# Patient Record
Sex: Female | Born: 1965 | Race: White | Hispanic: No | Marital: Married | Smoking: Current every day smoker
Health system: Southern US, Community
[De-identification: ages and names within clinical notes are randomized; demographics above are authoritative.]

## PROBLEM LIST (undated history)

## (undated) DIAGNOSIS — I509 Heart failure, unspecified: Secondary | ICD-10-CM

## (undated) DIAGNOSIS — D72829 Elevated white blood cell count, unspecified: Secondary | ICD-10-CM

## (undated) DIAGNOSIS — I050 Rheumatic mitral stenosis: Secondary | ICD-10-CM

## (undated) DIAGNOSIS — C539 Malignant neoplasm of cervix uteri, unspecified: Secondary | ICD-10-CM

## (undated) DIAGNOSIS — F1320 Sedative, hypnotic or anxiolytic dependence, uncomplicated: Secondary | ICD-10-CM

## (undated) DIAGNOSIS — D649 Anemia, unspecified: Secondary | ICD-10-CM

## (undated) DIAGNOSIS — E876 Hypokalemia: Secondary | ICD-10-CM

## (undated) DIAGNOSIS — F419 Anxiety disorder, unspecified: Secondary | ICD-10-CM

## (undated) DIAGNOSIS — F329 Major depressive disorder, single episode, unspecified: Secondary | ICD-10-CM

## (undated) DIAGNOSIS — E119 Type 2 diabetes mellitus without complications: Secondary | ICD-10-CM

## (undated) DIAGNOSIS — R51 Headache: Secondary | ICD-10-CM

## (undated) DIAGNOSIS — A481 Legionnaires' disease: Secondary | ICD-10-CM

## (undated) DIAGNOSIS — J9600 Acute respiratory failure, unspecified whether with hypoxia or hypercapnia: Secondary | ICD-10-CM

## (undated) DIAGNOSIS — Z720 Tobacco use: Secondary | ICD-10-CM

## (undated) DIAGNOSIS — F410 Panic disorder [episodic paroxysmal anxiety] without agoraphobia: Secondary | ICD-10-CM

## (undated) DIAGNOSIS — F32A Depression, unspecified: Secondary | ICD-10-CM

## (undated) HISTORY — DX: Tobacco use: Z72.0

## (undated) HISTORY — DX: Heart failure, unspecified: I50.9

## (undated) HISTORY — DX: Legionnaires' disease: A48.1

## (undated) HISTORY — PX: NASAL SINUS SURGERY: SHX719

## (undated) HISTORY — DX: Sedative, hypnotic or anxiolytic dependence, uncomplicated: F13.20

## (undated) HISTORY — DX: Acute respiratory failure, unspecified whether with hypoxia or hypercapnia: J96.00

## (undated) HISTORY — DX: Malignant neoplasm of cervix uteri, unspecified: C53.9

## (undated) HISTORY — DX: Hypokalemia: E87.6

## (undated) HISTORY — PX: LASER ABLATION OF THE CERVIX: SHX1949

## (undated) HISTORY — PX: MOLE REMOVAL: SHX2046

## (undated) HISTORY — DX: Rheumatic mitral stenosis: I05.0

## (undated) HISTORY — DX: Elevated white blood cell count, unspecified: D72.829

---

## 1998-06-25 HISTORY — PX: CERVICAL CONIZATION W/BX: SHX1330

## 2002-06-01 ENCOUNTER — Ambulatory Visit (HOSPITAL_COMMUNITY): Admission: RE | Admit: 2002-06-01 | Discharge: 2002-06-01 | Payer: Self-pay | Admitting: Family Medicine

## 2002-06-01 ENCOUNTER — Encounter: Payer: Self-pay | Admitting: Family Medicine

## 2008-08-23 DIAGNOSIS — D649 Anemia, unspecified: Secondary | ICD-10-CM

## 2008-08-23 HISTORY — DX: Anemia, unspecified: D64.9

## 2009-06-25 HISTORY — PX: OTHER SURGICAL HISTORY: SHX169

## 2010-09-16 ENCOUNTER — Inpatient Hospital Stay (HOSPITAL_COMMUNITY): Payer: Medicare (Managed Care)

## 2010-09-16 ENCOUNTER — Inpatient Hospital Stay (HOSPITAL_COMMUNITY)
Admission: AD | Admit: 2010-09-16 | Discharge: 2010-09-25 | DRG: 207 | Disposition: A | Discharge status: Home / Self Care | Payer: Medicare (Managed Care) | Source: Other Acute Inpatient Hospital | Attending: Critical Care Medicine | Admitting: Critical Care Medicine

## 2010-09-16 DIAGNOSIS — I509 Heart failure, unspecified: Secondary | ICD-10-CM | POA: Diagnosis present

## 2010-09-16 DIAGNOSIS — J9600 Acute respiratory failure, unspecified whether with hypoxia or hypercapnia: Secondary | ICD-10-CM | POA: Diagnosis present

## 2010-09-16 DIAGNOSIS — I059 Rheumatic mitral valve disease, unspecified: Secondary | ICD-10-CM | POA: Diagnosis present

## 2010-09-16 DIAGNOSIS — I498 Other specified cardiac arrhythmias: Secondary | ICD-10-CM | POA: Diagnosis present

## 2010-09-16 DIAGNOSIS — J189 Pneumonia, unspecified organism: Principal | ICD-10-CM | POA: Diagnosis present

## 2010-09-16 DIAGNOSIS — E876 Hypokalemia: Secondary | ICD-10-CM | POA: Diagnosis present

## 2010-09-16 DIAGNOSIS — E440 Moderate protein-calorie malnutrition: Secondary | ICD-10-CM | POA: Diagnosis present

## 2010-09-16 DIAGNOSIS — F17200 Nicotine dependence, unspecified, uncomplicated: Secondary | ICD-10-CM | POA: Diagnosis present

## 2010-09-16 DIAGNOSIS — K219 Gastro-esophageal reflux disease without esophagitis: Secondary | ICD-10-CM | POA: Diagnosis present

## 2010-09-16 LAB — MRSA PCR SCREENING: MRSA by PCR: NEGATIVE

## 2010-09-17 ENCOUNTER — Inpatient Hospital Stay (HOSPITAL_COMMUNITY): Payer: Medicare (Managed Care)

## 2010-09-17 DIAGNOSIS — J158 Pneumonia due to other specified bacteria: Secondary | ICD-10-CM

## 2010-09-17 DIAGNOSIS — J9600 Acute respiratory failure, unspecified whether with hypoxia or hypercapnia: Secondary | ICD-10-CM

## 2010-09-17 LAB — POCT I-STAT 3, ART BLOOD GAS (G3+)
Bicarbonate: 26 mEq/L — ABNORMAL HIGH (ref 20.0–24.0)
O2 Saturation: 96 %
Patient temperature: 98.6
TCO2: 27 mmol/L (ref 0–100)
pCO2 arterial: 51.2 mmHg — ABNORMAL HIGH (ref 35.0–45.0)
pCO2 arterial: 50.7 mmHg — ABNORMAL HIGH (ref 35.0–45.0)
pH, Arterial: 7.306 — ABNORMAL LOW (ref 7.350–7.400)
pH, Arterial: 7.319 — ABNORMAL LOW (ref 7.350–7.400)
pO2, Arterial: 166 mmHg — ABNORMAL HIGH (ref 80.0–100.0)

## 2010-09-17 LAB — LIPID PANEL
Cholesterol: 257 mg/dL — ABNORMAL HIGH (ref 0–200)
HDL: 41 mg/dL (ref >39)
LDL Cholesterol: UNDETERMINED mg/dL (ref 0–99)
Total CHOL/HDL Ratio: 6.3 RATIO
Triglycerides: 443 mg/dL — ABNORMAL HIGH (ref <150)
VLDL: UNDETERMINED mg/dL (ref 0–40)

## 2010-09-17 LAB — BASIC METABOLIC PANEL
BUN: 12 mg/dL (ref 6–23)
CO2: 26 mEq/L (ref 19–32)
Calcium: 7.9 mg/dL — ABNORMAL LOW (ref 8.4–10.5)
Calcium: 7.6 mg/dL — ABNORMAL LOW (ref 8.4–10.5)
Creatinine, Ser: 0.65 mg/dL (ref 0.4–1.2)
GFR calc Af Amer: >60 mL/min (ref >60)
GFR calc Af Amer: >60 mL/min (ref >60)
GFR calc non Af Amer: >60 mL/min (ref >60)
Potassium: 3.9 mEq/L (ref 3.5–5.1)
Sodium: 139 mEq/L (ref 135–145)

## 2010-09-17 LAB — CBC
HCT: 30.7 % — ABNORMAL LOW (ref 36.0–46.0)
Hemoglobin: 11.6 g/dL — ABNORMAL LOW (ref 12.0–15.0)
MCH: 31.9 pg (ref 26.0–34.0)
MCHC: 32.6 g/dL (ref 30.0–36.0)
Platelets: 422 10*3/uL — ABNORMAL HIGH (ref 150–400)
Platelets: 347 10*3/uL (ref 150–400)
RDW: 14.2 % (ref 11.5–15.5)
WBC: 17.8 10*3/uL — ABNORMAL HIGH (ref 4.0–10.5)

## 2010-09-17 LAB — BRAIN NATRIURETIC PEPTIDE: Pro B Natriuretic peptide (BNP): 270 pg/mL — ABNORMAL HIGH (ref 0.0–100.0)

## 2010-09-18 ENCOUNTER — Inpatient Hospital Stay (HOSPITAL_COMMUNITY): Payer: Medicare (Managed Care)

## 2010-09-18 LAB — BLOOD GAS, ARTERIAL
Acid-Base Excess: 1 mmol/L (ref 0.0–2.0)
Acid-Base Excess: 5 mmol/L — ABNORMAL HIGH (ref 0.0–2.0)
Drawn by: 29925
Drawn by: 31996
FIO2: 0.7 %
FIO2: 0.4 %
MECHVT: 360 mL
MECHVT: 360 mL
O2 Saturation: 99.2 %
O2 Saturation: 95.5 %
PEEP: 5 cmH2O
Patient temperature: 98.6
RATE: 35 resp/min
RATE: 28 resp/min
TCO2: 27.6 mmol/L (ref 0–100)
pO2, Arterial: 149 mmHg — ABNORMAL HIGH (ref 80.0–100.0)
pO2, Arterial: 73.3 mmHg — ABNORMAL LOW (ref 80.0–100.0)

## 2010-09-18 LAB — BASIC METABOLIC PANEL
BUN: 8 mg/dL (ref 6–23)
BUN: 9 mg/dL (ref 6–23)
BUN: 11 mg/dL (ref 6–23)
CO2: 24 mEq/L (ref 19–32)
CO2: 29 mEq/L (ref 19–32)
CO2: 28 mEq/L (ref 19–32)
Calcium: 7.3 mg/dL — ABNORMAL LOW (ref 8.4–10.5)
Calcium: 8.2 mg/dL — ABNORMAL LOW (ref 8.4–10.5)
Chloride: 111 mEq/L (ref 96–112)
Chloride: 105 mEq/L (ref 96–112)
Creatinine, Ser: 0.57 mg/dL (ref 0.4–1.2)
Creatinine, Ser: 0.58 mg/dL (ref 0.4–1.2)
Creatinine, Ser: 0.62 mg/dL (ref 0.4–1.2)
GFR calc Af Amer: >60 mL/min (ref >60)
GFR calc non Af Amer: >60 mL/min (ref >60)
Glucose, Bld: 147 mg/dL — ABNORMAL HIGH (ref 70–99)
Potassium: 3.8 mEq/L (ref 3.5–5.1)
Sodium: 140 mEq/L (ref 135–145)

## 2010-09-18 LAB — CBC
Hemoglobin: 8.6 g/dL — ABNORMAL LOW (ref 12.0–15.0)
MCH: 32.2 pg (ref 26.0–34.0)
MCHC: 32.8 g/dL (ref 30.0–36.0)
MCV: 98.1 fL (ref 78.0–100.0)
RBC: 2.67 MIL/uL — ABNORMAL LOW (ref 3.87–5.11)

## 2010-09-18 LAB — MAGNESIUM: Magnesium: 2 mg/dL (ref 1.5–2.5)

## 2010-09-18 LAB — CORTISOL: Cortisol, Plasma: 7.1 ug/dL

## 2010-09-18 LAB — PHOSPHORUS: Phosphorus: 2.8 mg/dL (ref 2.3–4.6)

## 2010-09-18 LAB — PROTIME-INR: INR: 1.28 (ref 0.00–1.49)

## 2010-09-19 ENCOUNTER — Inpatient Hospital Stay (HOSPITAL_COMMUNITY): Payer: Medicare (Managed Care)

## 2010-09-19 DIAGNOSIS — J9600 Acute respiratory failure, unspecified whether with hypoxia or hypercapnia: Secondary | ICD-10-CM

## 2010-09-19 DIAGNOSIS — J189 Pneumonia, unspecified organism: Secondary | ICD-10-CM

## 2010-09-19 DIAGNOSIS — R579 Shock, unspecified: Secondary | ICD-10-CM

## 2010-09-19 LAB — COMPREHENSIVE METABOLIC PANEL
ALT: 16 U/L (ref 0–35)
AST: 13 U/L (ref 0–37)
Calcium: 8.2 mg/dL — ABNORMAL LOW (ref 8.4–10.5)
GFR calc Af Amer: >60 mL/min (ref >60)
Glucose, Bld: 140 mg/dL — ABNORMAL HIGH (ref 70–99)
Sodium: 140 mEq/L (ref 135–145)
Total Protein: 5.9 g/dL — ABNORMAL LOW (ref 6.0–8.3)

## 2010-09-19 LAB — GLUCOSE, CAPILLARY
Glucose-Capillary: 125 mg/dL — ABNORMAL HIGH (ref 70–99)
Glucose-Capillary: 180 mg/dL — ABNORMAL HIGH (ref 70–99)
Glucose-Capillary: 145 mg/dL — ABNORMAL HIGH (ref 70–99)
Glucose-Capillary: 143 mg/dL — ABNORMAL HIGH (ref 70–99)
Glucose-Capillary: 146 mg/dL — ABNORMAL HIGH (ref 70–99)
Glucose-Capillary: 157 mg/dL — ABNORMAL HIGH (ref 70–99)

## 2010-09-19 LAB — BRAIN NATRIURETIC PEPTIDE: Pro B Natriuretic peptide (BNP): 127 pg/mL — ABNORMAL HIGH (ref 0.0–100.0)

## 2010-09-19 LAB — CBC
MCHC: 31.8 g/dL (ref 30.0–36.0)
RDW: 13.9 % (ref 11.5–15.5)

## 2010-09-19 LAB — LEGIONELLA ANTIGEN, URINE

## 2010-09-19 LAB — INFLUENZA PANEL BY PCR (TYPE A & B)
H1N1 flu by pcr: NOT DETECTED
Influenza A By PCR: NEGATIVE

## 2010-09-19 LAB — C4 COMPLEMENT: Complement C4, Body Fluid: 23 mg/dL (ref 16–47)

## 2010-09-20 ENCOUNTER — Inpatient Hospital Stay (HOSPITAL_COMMUNITY): Payer: Medicare (Managed Care)

## 2010-09-20 DIAGNOSIS — J158 Pneumonia due to other specified bacteria: Secondary | ICD-10-CM

## 2010-09-20 LAB — CBC
HCT: 27.3 % — ABNORMAL LOW (ref 36.0–46.0)
Hemoglobin: 8.9 g/dL — ABNORMAL LOW (ref 12.0–15.0)
MCH: 32.1 pg (ref 26.0–34.0)
MCV: 98.6 fL (ref 78.0–100.0)
RBC: 2.77 MIL/uL — ABNORMAL LOW (ref 3.87–5.11)
WBC: 16.1 10*3/uL — ABNORMAL HIGH (ref 4.0–10.5)

## 2010-09-20 LAB — BLOOD GAS, ARTERIAL
Drawn by: 312761
MECHVT: 420 mL
PEEP: 5 cmH2O
Patient temperature: 98.6
RATE: 20 resp/min
TCO2: 36.9 mmol/L (ref 0–100)
pH, Arterial: 7.412 — ABNORMAL HIGH (ref 7.350–7.400)

## 2010-09-20 LAB — BASIC METABOLIC PANEL
CO2: 34 mEq/L — ABNORMAL HIGH (ref 19–32)
Calcium: 8.4 mg/dL (ref 8.4–10.5)
Creatinine, Ser: 0.63 mg/dL (ref 0.4–1.2)
GFR calc Af Amer: >60 mL/min (ref >60)
GFR calc non Af Amer: >60 mL/min (ref >60)

## 2010-09-20 LAB — GLUCOSE, CAPILLARY
Glucose-Capillary: 153 mg/dL — ABNORMAL HIGH (ref 70–99)
Glucose-Capillary: 154 mg/dL — ABNORMAL HIGH (ref 70–99)
Glucose-Capillary: 166 mg/dL — ABNORMAL HIGH (ref 70–99)
Glucose-Capillary: 206 mg/dL — ABNORMAL HIGH (ref 70–99)
Glucose-Capillary: 201 mg/dL — ABNORMAL HIGH (ref 70–99)

## 2010-09-20 LAB — MAGNESIUM: Magnesium: 2.1 mg/dL (ref 1.5–2.5)

## 2010-09-20 LAB — ANTI-NUCLEAR AB-TITER (ANA TITER)

## 2010-09-21 ENCOUNTER — Inpatient Hospital Stay (HOSPITAL_COMMUNITY): Payer: Medicare (Managed Care)

## 2010-09-21 LAB — GLUCOSE, CAPILLARY
Glucose-Capillary: 145 mg/dL — ABNORMAL HIGH (ref 70–99)
Glucose-Capillary: 136 mg/dL — ABNORMAL HIGH (ref 70–99)
Glucose-Capillary: 170 mg/dL — ABNORMAL HIGH (ref 70–99)
Glucose-Capillary: 195 mg/dL — ABNORMAL HIGH (ref 70–99)

## 2010-09-21 LAB — BLOOD GAS, ARTERIAL
Bicarbonate: 36.2 mEq/L — ABNORMAL HIGH (ref 20.0–24.0)
Drawn by: 27022
Expiratory PAP: 5
Inspiratory PAP: 5
pCO2 arterial: 55.2 mmHg — ABNORMAL HIGH (ref 35.0–45.0)
pH, Arterial: 7.432 — ABNORMAL HIGH (ref 7.350–7.400)
pO2, Arterial: 78.9 mmHg — ABNORMAL LOW (ref 80.0–100.0)

## 2010-09-21 LAB — BASIC METABOLIC PANEL
CO2: 36 mEq/L — ABNORMAL HIGH (ref 19–32)
Chloride: 97 mEq/L (ref 96–112)
GFR calc Af Amer: >60 mL/min (ref >60)
Sodium: 145 mEq/L (ref 135–145)

## 2010-09-21 LAB — POCT I-STAT 3, ART BLOOD GAS (G3+)
Acid-Base Excess: 13 mmol/L — ABNORMAL HIGH (ref 0.0–2.0)
Acid-Base Excess: 13 mmol/L — ABNORMAL HIGH (ref 0.0–2.0)
Bicarbonate: 38.4 mEq/L — ABNORMAL HIGH (ref 20.0–24.0)
O2 Saturation: 96 %
Patient temperature: 37
pH, Arterial: 7.457 — ABNORMAL HIGH (ref 7.350–7.400)
pO2, Arterial: 37 mmHg — CL (ref 80.0–100.0)
pO2, Arterial: 76 mmHg — ABNORMAL LOW (ref 80.0–100.0)

## 2010-09-21 LAB — CBC
Hemoglobin: 9 g/dL — ABNORMAL LOW (ref 12.0–15.0)
Platelets: 346 10*3/uL (ref 150–400)
RBC: 2.96 MIL/uL — ABNORMAL LOW (ref 3.87–5.11)
WBC: 14.7 10*3/uL — ABNORMAL HIGH (ref 4.0–10.5)

## 2010-09-21 LAB — MAGNESIUM: Magnesium: 2.4 mg/dL (ref 1.5–2.5)

## 2010-09-21 LAB — PHOSPHORUS: Phosphorus: 3.8 mg/dL (ref 2.3–4.6)

## 2010-09-22 ENCOUNTER — Inpatient Hospital Stay (HOSPITAL_COMMUNITY): Payer: Medicare (Managed Care)

## 2010-09-22 DIAGNOSIS — R579 Shock, unspecified: Secondary | ICD-10-CM

## 2010-09-22 DIAGNOSIS — J9600 Acute respiratory failure, unspecified whether with hypoxia or hypercapnia: Secondary | ICD-10-CM

## 2010-09-22 DIAGNOSIS — J158 Pneumonia due to other specified bacteria: Secondary | ICD-10-CM

## 2010-09-22 LAB — CBC
HCT: 32.4 % — ABNORMAL LOW (ref 36.0–46.0)
MCHC: 32.7 g/dL (ref 30.0–36.0)
Platelets: 462 10*3/uL — ABNORMAL HIGH (ref 150–400)
RDW: 13.4 % (ref 11.5–15.5)
WBC: 17.3 10*3/uL — ABNORMAL HIGH (ref 4.0–10.5)

## 2010-09-22 LAB — BASIC METABOLIC PANEL
CO2: 35 mEq/L — ABNORMAL HIGH (ref 19–32)
Calcium: 9.4 mg/dL (ref 8.4–10.5)
Chloride: 97 mEq/L (ref 96–112)
Creatinine, Ser: 0.66 mg/dL (ref 0.4–1.2)
GFR calc Af Amer: >60 mL/min (ref >60)
Sodium: 144 mEq/L (ref 135–145)

## 2010-09-22 LAB — PHOSPHORUS: Phosphorus: 3.8 mg/dL (ref 2.3–4.6)

## 2010-09-22 LAB — MAGNESIUM: Magnesium: 2.4 mg/dL (ref 1.5–2.5)

## 2010-09-22 LAB — GLUCOSE, CAPILLARY
Glucose-Capillary: 187 mg/dL — ABNORMAL HIGH (ref 70–99)
Glucose-Capillary: 207 mg/dL — ABNORMAL HIGH (ref 70–99)
Glucose-Capillary: 146 mg/dL — ABNORMAL HIGH (ref 70–99)

## 2010-09-23 ENCOUNTER — Inpatient Hospital Stay (HOSPITAL_COMMUNITY): Payer: Medicare (Managed Care)

## 2010-09-23 LAB — GLUCOSE, CAPILLARY
Glucose-Capillary: 169 mg/dL — ABNORMAL HIGH (ref 70–99)
Glucose-Capillary: 168 mg/dL — ABNORMAL HIGH (ref 70–99)
Glucose-Capillary: 198 mg/dL — ABNORMAL HIGH (ref 70–99)

## 2010-09-23 LAB — BASIC METABOLIC PANEL
BUN: 25 mg/dL — ABNORMAL HIGH (ref 6–23)
CO2: 32 mEq/L (ref 19–32)
Glucose, Bld: 136 mg/dL — ABNORMAL HIGH (ref 70–99)
Potassium: 3.6 mEq/L (ref 3.5–5.1)
Sodium: 142 mEq/L (ref 135–145)

## 2010-09-23 LAB — CBC
HCT: 33.1 % — ABNORMAL LOW (ref 36.0–46.0)
Hemoglobin: 10.9 g/dL — ABNORMAL LOW (ref 12.0–15.0)
MCV: 97.1 fL (ref 78.0–100.0)
WBC: 18.6 10*3/uL — ABNORMAL HIGH (ref 4.0–10.5)

## 2010-09-24 DIAGNOSIS — J9600 Acute respiratory failure, unspecified whether with hypoxia or hypercapnia: Secondary | ICD-10-CM

## 2010-09-24 DIAGNOSIS — J158 Pneumonia due to other specified bacteria: Secondary | ICD-10-CM

## 2010-09-24 DIAGNOSIS — R579 Shock, unspecified: Secondary | ICD-10-CM

## 2010-09-24 LAB — BASIC METABOLIC PANEL
CO2: 27 mEq/L (ref 19–32)
Calcium: 9.1 mg/dL (ref 8.4–10.5)
Chloride: 101 mEq/L (ref 96–112)
GFR calc Af Amer: >60 mL/min (ref >60)
Potassium: 2.9 mEq/L — ABNORMAL LOW (ref 3.5–5.1)
Sodium: 142 mEq/L (ref 135–145)

## 2010-09-24 LAB — GLUCOSE, CAPILLARY
Glucose-Capillary: 164 mg/dL — ABNORMAL HIGH (ref 70–99)
Glucose-Capillary: 104 mg/dL — ABNORMAL HIGH (ref 70–99)

## 2010-09-25 ENCOUNTER — Inpatient Hospital Stay (HOSPITAL_COMMUNITY): Payer: Medicare (Managed Care)

## 2010-09-25 DIAGNOSIS — J810 Acute pulmonary edema: Secondary | ICD-10-CM

## 2010-09-25 DIAGNOSIS — I050 Rheumatic mitral stenosis: Secondary | ICD-10-CM

## 2010-09-25 DIAGNOSIS — F17200 Nicotine dependence, unspecified, uncomplicated: Secondary | ICD-10-CM

## 2010-09-25 LAB — BASIC METABOLIC PANEL
CO2: 24 mEq/L (ref 19–32)
Chloride: 103 mEq/L (ref 96–112)
Creatinine, Ser: 0.66 mg/dL (ref 0.4–1.2)
GFR calc Af Amer: >60 mL/min (ref >60)
Potassium: 3.5 mEq/L (ref 3.5–5.1)
Sodium: 137 mEq/L (ref 135–145)

## 2010-09-25 LAB — GLUCOSE, CAPILLARY
Glucose-Capillary: 144 mg/dL — ABNORMAL HIGH (ref 70–99)
Glucose-Capillary: 167 mg/dL — ABNORMAL HIGH (ref 70–99)

## 2010-09-25 LAB — CBC
MCH: 31.4 pg (ref 26.0–34.0)
MCHC: 32.7 g/dL (ref 30.0–36.0)
MCV: 96 fL (ref 78.0–100.0)
Platelets: 517 10*3/uL — ABNORMAL HIGH (ref 150–400)
RBC: 3.47 MIL/uL — ABNORMAL LOW (ref 3.87–5.11)

## 2010-09-25 LAB — PHOSPHORUS: Phosphorus: 3.7 mg/dL (ref 2.3–4.6)

## 2010-09-25 LAB — MAGNESIUM: Magnesium: 2.3 mg/dL (ref 1.5–2.5)

## 2010-10-03 NOTE — Discharge Summary (Addendum)
Holly Figueroa, Holly Figueroa           ACCOUNT NO.:  [id number]  MEDICAL RECORD NO.:  [id number]           PATIENT TYPE:  I  LOCATION:  2001                         FACILITY:  [place]  PHYSICIAN:  Brand Males, MD   DATE OF BIRTH:  Nov 27, 1965  DATE OF ADMISSION:  09/16/2010 DATE OF DISCHARGE:  09/25/2010                              DISCHARGE SUMMARY   DISCHARGE DIAGNOSES: 1. Acute respiratory failure. 2. Hypokalemia. 3. Congestive heart failure secondary to mitral stenosis. 4. Leukocytosis. 5. Xanax dependence 6. Tobacco abuse.  LINE/TUBES: 1. Oral endotracheal tube from March 29. 2. Left IJ central line from March 22 which has since been removed. 3. Left arterial line from March 25 which has since been removed.  CULTURE DATA:  Urine Legionella on March 26 is negative.  March 24 MRSA PCR is negative.  Influenza PCR on March 27 which is nondetected.  ANTIBIOTIC DATA: 1. The patient was placed on cefepime from March 24 and has since been     stopped, ciprofloxacin from March 24 and has since been stopped. 2. Vancomycin from March 21 to March 24. 3. Zosyn from March 21 to March 24. 4. Avelox from March 16 to March 24.  BEST PRACTICE:  The patient was placed on heparin subcutaneously for DVT prophylaxis and Protonix for stress ulcer prevention.  She also while in intensive care had SCDs.  PROTOCOL/CONSULTANT: 1. ARDS protocol. 2. Continuous sedation.  KEY EVENTS/STUDIES:  The patient with increased O2 requirements and PEEP of 12 needed.  LABORATORY DATA:  April 2 CBC demonstrates WBC 16.6, hemoglobin 10.9, hematocrit 33.3, platelet count of 517,000.  April 2 BMP demonstrates sodium 137, potassium 3.5, chloride 103, CO2 24, glucose 739, BUN 18, creatinine 0.66, calcium 9.1, magnesium 2.3, and phosphorus 3.7.  AUTOIMMUNE PANEL:  ESR 127 on March 26, C3 complement 127, C4 complement 123, C-reactive protein 32.1, rheumatoid factor 22.  Antinuclear antibody titer is 1.40.   Antinuclear antibody is positive.  HISTORY OF PRESENT ILLNESS:  Holly Figueroa is a 45 year old female who was admitted to [facility] for pneumonia and discharged on March 20 after improvement.  She subsequently had a readmission for dyspnea on exertion and required intubation for hypoxic respiratory failure which appeared to be secondary to ARDS.  She was transferred to [facility] on March 24 for further evaluation.  She was admitted to the medical intensive care unit and placed on ARDS protocol. She was aggressively diuresed and autoimmune panel was assessed.  Please see above laboratory data.  Holly Figueroa was noted to be tachycardic during initial period of admission.  A 2-D echo was evaluated and demonstrated an estimated ejection fraction of 60-65% with noted mitral valve, moderate to severe stenosis.  Also noted was mildly thickened leaflets and leaflet separation was moderately reduced.  She transiently required vasoactive agents to maintain normal blood pressure in the setting of shock.  She had significant pulmonary infiltrates on chest x- ray which was all consistent with combination of ARDS and pulmonary edema.  She was treated for possible community-acquired pneumonia as well as flu screen which was negative.  During hospitalization she underwent placement of pulmonary  artery catheter to assess pulmonary and cardiac pressures.  TEE performed was also consistent with severe mitral stenosis.  It is recommended that Dr. Einar Gip follow with Holly Figueroa during hospitalization.  It is recommended at the time of discharge if she once post discharge and stable, have a possible right and left heart cath.  She was weaned off pressors and maintained normal blood pressure on the 28th.  She subsequently was extubated on the 29th.  Post extubation she required no further pulmonary interventions.  She was continued on diuresis and supplemental potassium placement  for hypokalemia.  She was treated for community-acquired pneumonia with IV antibiotics.  Please see above antibiotic data for listings.  Prior to admission, Holly Figueroa does have a known history of Xanax dependence. She apparently is followed at a pain clinic for such and has recently changed physician and indicates that she currently is seeing Dr. Humphrey Figueroa for management.  HOSPITAL COURSE BY DISCHARGE DIAGNOSIS: 1. Acute respiratory failure.  This was secondary to community-     acquired pneumonia, ARDS, and pulmonary edema in the setting of     mitral stenosis.  As per HPI, Holly Figueroa was admitted to [facility] on transfer from [facility] on March 24.  She was noted     to have significant pulmonary infiltrates consistent with edema.     She underwent 2-D echocardiogram for an evaluation for infiltrates     which initially demonstrated an EF of 70-75%.  She also was noted     to have a moderately increased pulmonary artery pressure at 57     mmHg.  She further underwent evaluation with a TEE in the setting     of diastolic dysfunction and pulmonary edema.  TEE demonstrated an     estimated EF of 60-65% with severe mitral stenosis.  Holly Figueroa     remained on mechanical ventilation in the setting of pulmonary     edema and community-acquired pneumonia until March 29 at which time     she was successfully liberated from the ventilator with no further     pulmonary interventions.  Culture data to date are negative.  She     was treated with IV antibiotics for community-acquired pneumonia.     At time of discharge, she has completed her antibiotics and will     continue on prednisone taper to off.  Ambulatory desaturations were     checked at time of discharge and she did not have desaturations     with walking. 2. Hypokalemia in the setting of aggressive diuresis.  She will be     discharged on 20 mg of Lasix as well as potassium supplementation.     Please see medication  list for details. 3. Congestive heart failure secondary to mitral stenosis.  Currently     this is resolved.  Initially she was admitted with pulmonary edema     and bilateral infiltrates consistent with ARDS and edema type     pattern.  She was treated for community-acquired pneumonia and     aggressively diuresed as well as placed on ARDS protocol.     Initially transiently she did require septic shock protocol.     Again, however, all culture data is negative.  At time of     discharge, Ms. Maret will follow up with Dr. Einar Gip in 2 weeks in     office.  She is being discharged on aspirin, metoprolol, Lasix, and  lisinopril per Dr. Irven Shelling recommendations. 4. Leukocytosis.  This is slowly improving.  This was likely secondary     to recent acute infectious process and steroid use.  This would     likely resolve with cessation of steroids.  It is recommended for     her to follow up with her primary care provider as needed. 5. Xanax dependent.  She does take 1 mg t.i.d. at home.  At time of     discharge, it is recommended she follow with Dr. Humphrey Figueroa for further     evaluation as previously scheduled. 6. Tobacco dependence.  Ms. Benegas did receive smoking cessation     counseling during hospitalization and she is recommended at time of     discharge to follow up with Dr. Chase Caller with PFTs for evaluation     of COPD staging and medical recommendations.  DISCHARGE INSTRUCTIONS: 1. Activity as tolerated and per outpatient physical therapy     recommendations. 2. Diet:  Low-sodium heart-healthy diet.  FOLLOWUP:  She is scheduled to follow up with Dr. Chase Caller on Wednesday April 18 at 3:00 p.m. for PFTs and 4:00 p.m. for actual physician appointment.  She is scheduled to follow with Dr. Einar Gip in 2 weeks for followup appointment as well as Dr. Humphrey Figueroa.  She is to call for an appointment in 1-2 weeks.  RECOMMENDATIONS:  Ms. Vokes is recommended to follow up with outpatient  physical therapy, this being set up at discharge.  She also apparently had a court date and she was given letter stating her date to admission per her request.  DISCHARGE MEDICATIONS: 1. Aspirin 81 mg by mouth daily. 2. Furosemide 20 mg by mouth daily. 3. K-Dur 20 mEq 1 tablet by mouth daily. 4. Lisinopril 5 mg 1 tablet by mouth daily. 5. Metoprolol 12.5 mg by mouth twice daily. 6. Prednisone 10 mg tabs, 2 tabs daily for 3 days and 1 tablet daily     for 3 days and 1/2 tablet daily for 3 days and stop. 7. Prilosec 20 mg by mouth daily. 8. Promethazine 25 mg by mouth every 6 hours as needed. 9. Vicodin 5/500, 1 tablet by mouth every 6 hours as needed. 10.Xanax 1 mg tablet by mouth 4 times daily as needed for anxiety.  She is instructed to stop taking the following medications: 1. Atenolol.  DISPOSITION AT TIME OF DISCHARGE:  Ms. Denson has met maximum benefit of inpatient therapy and is currently medically stable and cleared for discharge, pending followup as above.     Noe Gens, NP   ______________________________ Brand Males, MD    BO/MEDQ  D:  09/25/2010  T:  09/26/2010  Job:  [id number]  cc:   Eden Lathe. Einar Gip, MD Dr. Anselmo Rod, MD  Electronically Signed by Noe Gens  on 10/03/2010 07:48:44 AM Electronically Signed by Brand Males MD on 10/24/2010 12:27:07 AM MedRecNo: [id number] [facility], Account: [id number], DocSeq: [id number]> 35 min in dc planning Electronically Signed by Brand Males MD on 10/24/2010 12:26:45 AM

## 2010-10-09 ENCOUNTER — Encounter: Payer: Self-pay | Admitting: Internal Medicine

## 2010-10-09 NOTE — Consult Note (Signed)
Holly Figueroa, Holly Figueroa           ACCOUNT NO.:  [id number]  MEDICAL RECORD NO.:  [id number]           PATIENT TYPE:  I  LOCATION:  2903                         FACILITY:  [place]  PHYSICIAN:  Eden Lathe. Einar Gip, MD       DATE OF BIRTH:  [birth year]  DATE OF CONSULTATION:  09/19/2010 DATE OF DISCHARGE:                                CONSULTATION   REASON FOR CONSULTATION:  Please evaluate valvular abnormality, please evaluate congestive heart failure.  HISTORY:  [name] is a 45 year old female with history of "heart valve disease", Legionella pneumophila, history of migraine headaches who was admitted initially to [facility] with respiratory failure on September 13, 2010.  The history is obtained via chart review.  On arrival to the hospital, she was in acute respiratory distress with a blood pressure of 180/100 mmHg and chest x-ray showed bilateral infiltrates suspected that she could either have pneumonia or congestive heart failure.  Eventually, the patient was intubated.  They felt that it was acute respiratory distress syndrome and she was treated with antibiotics and felt that this was new due to pneumonia.  She was eventually transferred over to [facility] for further evaluation and management.  She did have an echocardiogram that was done at [facility] which showed hyperdynamic left ventricle systolic function.  There was left ventricular outflow tract obstruction noted due to hyperdynamic left ventricle.  There was moderate pulmonary hypertension.  There was also moderate aortic regurgitation and mild mitral regurgitation.  She is presently intubated and I have been asked to evaluate both the abnormal echocardiogram that was done this afternoon and possible PEE to better delineate her valvular abnormalities.  There was no fever until last night.  The patient's family states that she was well until 2 days prior to  the admission, where she was complaining of shortness of breath.  No history of paroxysmal nocturnal dyspnea or orthopnea prior to this episode.  REVIEW OF SYSTEMS:  Not obtainable.  However, the patient review of chart reveal that she was told that she has valvular heart disease. Multiple tooth abscesses in the past and was supposed to have it extracted in September 12, 2010.  History of anxiety and depression and manic depressive episodes.  Anemia and migraine headaches.  SOCIAL HISTORY:  She smokes about a pack of cigarettes per day.  She drinks alcohol occasionally.  Denies any illicit drug use.  FAMILY HISTORY:  Significant for premature coronary artery disease and diabetes and myocardial infarction and history of aortic aneurysms. Three or four sisters "hole in the heart."  Home medications include: 1. Prilosec 20 mg p.o. daily. 2. Atenolol 100 mg p.o. daily. 3. Promethazine 25 mg q.6 h. p.r.n. 4. Xanax 1 mg p.o. q.i.d. 5. Vicodin 5/500 q.6 h. p.r.n. 6. Avelox 400 mg p.o. daily which she started 4 days prior to hospital     admission.  Presently, she is on cefepime and ciprofloxacin as IV antibiotic therapy per Pulmonary Critical Care.  She is also on Lasix 20 mg q.6 h., sliding scale insulin, Protonix 40 mg daily IV and Versed sedation.  PHYSICAL EXAMINATION:  GENERAL:  She is sedated, but appears well and does not appear to be septic clinically. VITAL SIGNS:  Heart rate of 77 beats per minute, respiration is 15, blood pressure is 117/71 mmHg. CARDIAC:  S1 and S2 was normal, 2/6 systolic murmur in the mitral area. I could not appreciate the diastolic murmur. CHEST:  Occasional scattered rhonchi. ABDOMEN:  Soft, nontender with bowel sounds heard in all four quadrants. EXTREMITIES:  Warm and nontender.  There was no ecchymosis or any rash that was evident.  No petechial hemorrhages.  There was no edema.  Her EKG demonstrates sinus rhythm, left atrial abnormality, poor  R-wave progression, but no evidence of ischemia.  Her rheumatoid factor was slightly elevated at 22 with normal being 0- 20, probably nonspecific.  Her C-reactive protein obviously was high at 32.  Her CMP was within normal limits with normal electrolytes, BUN and creatinine.  Blood sugar was elevated at 140.  CBC showed a white count of 17.5000, platelet count of 356,000 and a hemoglobin of 8.9 with hematocrit of 28.8.  Her pulmonary capillary wedge pressure was 42//32 mmHg.  Her wedge was anywhere from 24-32 mmHg.  Cardiac output is maintained at 7.5.  The cardiac index at 3.5.  Her chest x-ray does demonstrate pulmonary edema.  Endotracheal tube was in place.  Preliminary transesophageal echocardiogram does reveal evidence of what appears to be moderate if not severe mitral stenosis.  The left ventricle outflow tract pressure gradient that was noted by transthoracic echocardiogram this morning is not evident.  She had a peak gradient of 32 with a mean gradient of 36 mmHg across the aortic valve this morning and today about the TEE once her pressors were off. The mean gradient was only 10 mmHg.  Her mitral valve mean gradient appeared to be at least 10 mmHg or probably higher.  IMPRESSION: 1. Acute respiratory failure secondary to acute pulmonary edema.  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  The mitral stenosis     appears to be at least moderate if not severe. 2. Mild aortic stenosis utmost.  There appears to be mild restriction     and commissural fusion of the aortic valve leaflets.  Mild aortic     regurgitation was also evident.  RECOMMENDATIONS:  We will continue diuresis for now.  We will avoid any beta-agonistic agents or beta-blockers at this time if possible.  She may need left and right heart catheterization especially to evaluate hemodynamics more than angiography.  My suspicion is that she may need mitral valve commissurotomy or mitral valve  repair.  Please note, the TEE was difficult procedure as she is intubated laying flat in bed.  Repeat study may be considered to better define the mitral valve pathology.  Subgastric and transgastric views could not be obtained.  Overall, my clinical suspicion is strongly favoring moderate if not severe mitral stenosis.  However, if she does recuperate and does well and is extubated, we could also consider doing a stress.  If the mitral valve gradients are low, we could also consider doing a stress echocardiogram and reevaluate mitral valve pressure gradients at that time.  Further recommendations will follow. I will see the patient in the morning.  Thank you for asking me to see this pleasant patient who is very interesting.     Eden Lathe. Einar Gip, MD     JRG/MEDQ  D:  09/19/2010  T:  09/20/2010  Job:  [id number]  cc:   Providence Lanius, MD  Electronically Signed by Adrian Prows MD on 10/09/2010 11:57:22 AM

## 2010-10-11 ENCOUNTER — Encounter: Payer: Self-pay | Admitting: Internal Medicine

## 2010-10-11 ENCOUNTER — Ambulatory Visit (INDEPENDENT_AMBULATORY_CARE_PROVIDER_SITE_OTHER): Payer: Medicare (Managed Care) | Admitting: Internal Medicine

## 2010-10-11 DIAGNOSIS — J9600 Acute respiratory failure, unspecified whether with hypoxia or hypercapnia: Secondary | ICD-10-CM | POA: Insufficient documentation

## 2010-10-11 DIAGNOSIS — R0989 Other specified symptoms and signs involving the circulatory and respiratory systems: Secondary | ICD-10-CM

## 2010-10-11 DIAGNOSIS — Z720 Tobacco use: Secondary | ICD-10-CM

## 2010-10-11 DIAGNOSIS — F17200 Nicotine dependence, unspecified, uncomplicated: Secondary | ICD-10-CM

## 2010-10-11 DIAGNOSIS — R0609 Other forms of dyspnea: Secondary | ICD-10-CM

## 2010-10-11 LAB — PULMONARY FUNCTION TEST

## 2010-10-11 NOTE — Progress Notes (Signed)
  Subjective:    Patient ID: Holly Figueroa, female    DOB: 12-Oct-1965, 45 y.o.   MRN: [id number]  HPI 45 year old female tobacco abuse with mitral stenosis. Admitted end march 2012 for acute resp failure, pulmonary edema due to CHF. Now following with Dr Einar Gip for mitral valve. Followup now in pulmonary office for post acute resp failure, and tobacco abuse and rule out COPD. PFTs today are normal except for isolated low dlco 55% that could reflect early emphysema or Mitral stenosis. She feels well. Almost at baseline. Mildly deconditioned. Doing ADLs. Has quit smoking at discharge. Husband working on quitting smoking. No acute complaints. Walking desaturation in office 155 feet x 3 laps:    Review of Systems Constitutional:   No  weight loss, night sweats,  Fevers, chills, fatigue, lassitude. HEENT:   No headaches,  Difficulty swallowing,  Tooth/dental problems,  Sore throat,                No sneezing, itching, ear ache, nasal congestion, post nasal drip,   CV:  No chest pain,  Orthopnea, PND, swelling in lower extremities, anasarca, dizziness, palpitations  GI  No heartburn, indigestion, abdominal pain, nausea, vomiting, diarrhea, change in bowel habits, loss of appetite  Resp:  No excess mucus, no productive cough,  No non-productive cough,  No coughing up of blood.  No change in color of mucus.  No wheezing.  No chest wall deformity  Skin: no rash or lesions.  GU: no dysuria, change in color of urine, no urgency or frequency.  No flank pain.  MS:  No joint pain or swelling.  No decreased range of motion.  No back pain.  Psych:  No change in mood or affect. No depression or anxiety.  No memory loss.      Objective:   Physical Exam  [vitalsreviewed. Constitutional: She is oriented to person, place, and time. She appears well-developed and well-nourished. No distress.       Obese   HENT:  Head: Normocephalic and atraumatic.  Right Ear: External ear normal.  Left Ear:  External ear normal.  Mouth/Throat: Oropharynx is clear and moist. No oropharyngeal exudate.  Eyes: Conjunctivae and EOM are normal. Pupils are equal, round, and reactive to light. Right eye exhibits no discharge. Left eye exhibits no discharge. No scleral icterus.  Neck: Normal range of motion. Neck supple. No JVD present. No tracheal deviation present. No thyromegaly present.  Cardiovascular: Normal rate, regular rhythm and intact distal pulses.  Exam reveals no gallop and no friction rub.   Murmur heard. Pulmonary/Chest: Effort normal and breath sounds normal. No respiratory distress. She has no wheezes. She has no rales. She exhibits no tenderness.  Abdominal: Soft. Bowel sounds are normal. She exhibits no distension and no mass. There is no tenderness. There is no rebound and no guarding.  Musculoskeletal: Normal range of motion. She exhibits no edema and no tenderness.  Lymphadenopathy:    She has no cervical adenopathy.  Neurological: She is alert and oriented to person, place, and time. She has normal reflexes. No cranial nerve deficit. She exhibits normal muscle tone. Coordination normal.  Skin: Skin is warm and dry. No rash noted. She is not diaphoretic. No erythema. No pallor.  Psychiatric: She has a normal mood and affect. Her behavior is normal. Judgment and thought content normal.          Assessment & Plan:

## 2010-10-11 NOTE — Assessment & Plan Note (Signed)
Following acute pulmonary edema due to mitral stenosis in march 2012. This has resolved. She has quit smoking. PFTs 10/11/2010 is normal except for low dlco 55% which could reflect early emphysema or mild chf of mitrall stenosis. No specifiC Rx for it other than quit smoking. She did not desaturate on submaximal exertion in office. No need for active pulmonary followup unless clinical situation changes

## 2010-10-11 NOTE — Assessment & Plan Note (Signed)
Quit march 2012 following icu admit. 3 minute counseling to remain in remission. Husband encouraged to quit ASAP. He verbalized understanding

## 2010-10-11 NOTE — Patient Instructions (Signed)
Please stay quit with smoking Please encourage household members to quit smoking Please follow with Dr. Einar Gip No need for active pulmonary followup unless something changes

## 2010-10-11 NOTE — Progress Notes (Signed)
PFT done today.

## 2010-10-13 ENCOUNTER — Encounter: Payer: Self-pay | Admitting: Internal Medicine

## 2010-10-18 ENCOUNTER — Ambulatory Visit (HOSPITAL_COMMUNITY): Payer: Medicare (Managed Care)

## 2010-10-24 ENCOUNTER — Ambulatory Visit (HOSPITAL_COMMUNITY)
Admission: RE | Admit: 2010-10-24 | Discharge: 2010-10-24 | Disposition: A | Discharge status: Home / Self Care | Payer: Medicare (Managed Care) | Source: Ambulatory Visit | Attending: Cardiology | Admitting: Cardiology

## 2010-10-24 DIAGNOSIS — I059 Rheumatic mitral valve disease, unspecified: Secondary | ICD-10-CM | POA: Insufficient documentation

## 2012-06-03 ENCOUNTER — Encounter (INDEPENDENT_AMBULATORY_CARE_PROVIDER_SITE_OTHER): Payer: Self-pay | Admitting: General Surgery

## 2012-06-04 ENCOUNTER — Ambulatory Visit (INDEPENDENT_AMBULATORY_CARE_PROVIDER_SITE_OTHER): Payer: Medicare (Managed Care) | Admitting: General Surgery

## 2012-06-04 ENCOUNTER — Encounter (INDEPENDENT_AMBULATORY_CARE_PROVIDER_SITE_OTHER): Payer: Self-pay | Admitting: General Surgery

## 2012-06-04 VITALS — BP 114/72 | HR 68 | Temp 98.2°F | Resp 16 | Ht 66.0 in | Wt 177.2 lb

## 2012-06-04 DIAGNOSIS — K8020 Calculus of gallbladder without cholecystitis without obstruction: Secondary | ICD-10-CM

## 2012-06-04 NOTE — Addendum Note (Signed)
Addended by: Ralene Ok on: 06/04/2012 03:40 PM   Modules accepted: Orders

## 2012-06-04 NOTE — Progress Notes (Signed)
Patient ID: Holly Figueroa, female   DOB: August 13, 1965, 46 y.o.   MRN: [id number]  Chief Complaint  Patient presents with  . Pre-op Exam    Evaluate gallbladder    HPI Holly Figueroa is a 46 y.o. female.  Who was referred secondary to gallstones.  The patient was recently seen in [facility] ED for evaluation of epigastric pain. He states his to approximate 4 AM. Patient was ruled out for any cardiac event. The patient did have some nausea and without emesis at this time. Patient states the pain in the right upper quadrant has dated back for approximately 2 years. HPI  Past Medical History  Diagnosis Date  . Acute respiratory failure   . Hypokalemia   . CHF (congestive heart failure)   . Mitral stenosis   . Leukocytosis, unspecified   . Tobacco abuse   . Benzodiazepine dependence   . Legionella pneumonia   . Cervical cancer     Past Surgical History  Procedure Date  . Laser ablation of the cervix   . Mole removal   . Nasal sinus surgery   . 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 Cervical conization w/bx 2000    Family History  Problem Relation Age of Onset  . Heart disease Father   . COPD Father   . Heart failure Maternal Grandmother   . Stroke Mother     Social History History  Substance Use Topics  . Smoking status: Current Every Day Smoker -- 2.0 packs/day for 23 years    Types: Cigarettes    Last Attempt to Quit: 09/07/2010  . Smokeless tobacco: Not on file  . Alcohol Use: Yes    Allergies  Allergen Reactions  . Iodinated Diagnostic Agents Hives  . Sulfa Antibiotics Hives  . Dilaudid (Hydromorphone Hcl) Itching and Palpitations    Current Outpatient Prescriptions  Medication Sig Dispense Refill  . ALPRAZolam (XANAX) 1 MG tablet Take 1 mg by mouth at bedtime as needed.      Marland Kitchen aspirin 81 MG tablet Take 81 mg by mouth daily.        Marland Kitchen FLUoxetine (PROZAC) 20 MG capsule Take 20 mg by mouth daily.        . furosemide (LASIX) 20 MG  tablet Take 20 mg by mouth daily.        Marland Kitchen HYDROcodone-acetaminophen (VICODIN) 5-500 MG per tablet Take 1 tablet by mouth every 6 (six) hours as needed.        Marland Kitchen lisinopril (PRINIVIL,ZESTRIL) 5 MG tablet Take 5 mg by mouth daily.        . metoprolol tartrate (LOPRESSOR) 25 MG tablet 1/2 twice daily       . omeprazole (PRILOSEC) 20 MG capsule Take 20 mg by mouth daily.        . potassium chloride SA (K-DUR,KLOR-CON) 20 MEQ tablet Take 20 mEq by mouth daily.          Review of Systems Review of Systems  Constitutional: Negative.   HENT: Negative.   Respiratory: Negative.   Cardiovascular: Negative.   Gastrointestinal: Positive for nausea and abdominal pain.    Blood pressure 114/72, pulse 68, temperature 98.2 F (36.8 C), temperature source Temporal, resp. rate 16, height _0  (1.676 m), weight 177 lb 3.2 oz (80.377 kg).  Physical Exam Physical Exam  Constitutional: She is oriented to person, place, and time. She appears well-developed and well-nourished.  HENT:  Head: Normocephalic and atraumatic.  Eyes: Conjunctivae  normal and EOM are normal. Pupils are equal, round, and reactive to light.  Neck: Normal range of motion. Neck supple.  Cardiovascular: Normal rate and normal heart sounds.   Pulmonary/Chest: Effort normal and breath sounds normal.  Abdominal: Soft. Bowel sounds are normal. There is tenderness (ruq). There is no rebound and no guarding.  Neurological: She is alert and oriented to person, place, and time.    Data Reviewed Ultrasound which revealed multiple gallstones  Assessment    46 year old female with symptomatic cholelithiasis    Plan    1. We'll proceed to the operating room for laparoscope cholecystectomy with intraoperative cholangiogram.  2.All risks and benefits were discussed with the patient, to generally include infection, bleeding, damage to surrounding structures, and recurrence. Alternatives were offered and described.  All questions were  answered and the patient voiced understanding of the procedure and wishes to proceed at this point.        Rosario Jacks., Lottie Siska 06/04/2012, 2:59 PM

## 2012-06-13 ENCOUNTER — Encounter (HOSPITAL_COMMUNITY): Payer: Self-pay | Admitting: Pharmacy Technician

## 2012-06-19 ENCOUNTER — Ambulatory Visit (HOSPITAL_COMMUNITY)
Admission: RE | Admit: 2012-06-19 | Discharge: 2012-06-19 | Disposition: A | Discharge status: Home / Self Care | Payer: No Typology Code available for payment source | Source: Ambulatory Visit | Attending: General Surgery | Admitting: General Surgery

## 2012-06-19 ENCOUNTER — Other Ambulatory Visit (INDEPENDENT_AMBULATORY_CARE_PROVIDER_SITE_OTHER): Payer: Self-pay | Admitting: General Surgery

## 2012-06-19 ENCOUNTER — Encounter (HOSPITAL_COMMUNITY): Payer: Self-pay

## 2012-06-19 ENCOUNTER — Encounter (HOSPITAL_COMMUNITY)
Admission: RE | Admit: 2012-06-19 | Discharge: 2012-06-19 | Disposition: A | Discharge status: Home / Self Care | Payer: No Typology Code available for payment source | Source: Ambulatory Visit | Attending: General Surgery | Admitting: General Surgery

## 2012-06-19 DIAGNOSIS — Z01812 Encounter for preprocedural laboratory examination: Secondary | ICD-10-CM | POA: Insufficient documentation

## 2012-06-19 DIAGNOSIS — Z01818 Encounter for other preprocedural examination: Secondary | ICD-10-CM | POA: Insufficient documentation

## 2012-06-19 DIAGNOSIS — K8020 Calculus of gallbladder without cholecystitis without obstruction: Secondary | ICD-10-CM | POA: Insufficient documentation

## 2012-06-19 HISTORY — DX: Panic disorder (episodic paroxysmal anxiety): F41.0

## 2012-06-19 HISTORY — DX: Major depressive disorder, single episode, unspecified: F32.9

## 2012-06-19 HISTORY — DX: Depression, unspecified: F32.A

## 2012-06-19 HISTORY — DX: Anemia, unspecified: D64.9

## 2012-06-19 HISTORY — DX: Headache: R51

## 2012-06-19 HISTORY — DX: Anxiety disorder, unspecified: F41.9

## 2012-06-19 LAB — BASIC METABOLIC PANEL
Chloride: 100 mEq/L (ref 96–112)
GFR calc Af Amer: 77 mL/min — ABNORMAL LOW (ref >90)
GFR calc non Af Amer: 66 mL/min — ABNORMAL LOW (ref >90)
Glucose, Bld: 99 mg/dL (ref 70–99)
Potassium: 3.4 mEq/L — ABNORMAL LOW (ref 3.5–5.1)
Sodium: 138 mEq/L (ref 135–145)

## 2012-06-19 LAB — SURGICAL PCR SCREEN
MRSA, PCR: NEGATIVE
Staphylococcus aureus: NEGATIVE

## 2012-06-19 LAB — CBC
Hemoglobin: 12.5 g/dL (ref 12.0–15.0)
MCHC: 33.9 g/dL (ref 30.0–36.0)
WBC: 12.5 10*3/uL — ABNORMAL HIGH (ref 4.0–10.5)

## 2012-06-19 LAB — HCG, SERUM, QUALITATIVE: Preg, Serum: NEGATIVE

## 2012-06-19 MED ORDER — CEFAZOLIN SODIUM-DEXTROSE 2-3 GM-% IV SOLR
2.0000 g | INTRAVENOUS | Status: DC
  Filled 2012-06-19: qty 50

## 2012-06-19 MED ORDER — CHLORHEXIDINE GLUCONATE 4 % EX LIQD
1.0000 "application " | Freq: Once | CUTANEOUS | Status: DC
  Filled 2012-06-19: qty 15

## 2012-06-19 NOTE — Progress Notes (Addendum)
EKG 02-27-2012 received from Dr. Irven Shelling office on the chart cardiiologist saw last January 10, 2012 for echo, Dr. Einar Gip c and CXR done 06-19-2012 in Epic

## 2012-06-19 NOTE — Pre-Procedure Instructions (Signed)
Holly Figueroa  06/19/2012   Your procedure is scheduled on: Thursday, June 26, 2012  Report to [facility] at  AM. [phone]  Call this number if you have problems the morning of surgery: [phone]   Remember:   Do not drink liquids or  eat food:After Midnight. 06-25-2012 Wednesday night      Take these medicines the morning of surgery with A SIP OF WATER: Metoprolol,Prozac,Prilosec and Xanax   Do not wear jewelry, make-up or nail polish.  Do not wear lotions, powders, or perfumes. You may wear deodorant.  Do not shave 48 hours prior to surgery. Men may shave face and neck.  Do not bring valuables to the hospital.  Contacts, dentures or bridgework may not be worn into surgery.  Leave suitcase in the car. After surgery it may be brought to your room.  For patients admitted to the hospital, checkout time is 11:00 AM the day of discharge.   Patients discharged the day of surgery will not be allowed to drive home.  Name and phone number of your driver: Holly Figueroa  [address] Preparing for surgery sheet.   Please read over the following fact sheets that you were given: MRSA Information

## 2012-06-20 ENCOUNTER — Inpatient Hospital Stay (HOSPITAL_COMMUNITY): Admission: RE | Admit: 2012-06-20 | Payer: Medicare (Managed Care) | Source: Ambulatory Visit

## 2012-06-25 MED ORDER — CEFAZOLIN SODIUM-DEXTROSE 2-3 GM-% IV SOLR
2.0000 g | INTRAVENOUS | Status: AC
  Administered 2012-06-26: 2 g via INTRAVENOUS

## 2012-06-26 ENCOUNTER — Ambulatory Visit (HOSPITAL_COMMUNITY): Payer: Medicare FFS

## 2012-06-26 ENCOUNTER — Encounter (HOSPITAL_COMMUNITY): Payer: Self-pay | Admitting: Anesthesiology

## 2012-06-26 ENCOUNTER — Ambulatory Visit (HOSPITAL_COMMUNITY): Payer: Medicare FFS | Admitting: Anesthesiology

## 2012-06-26 ENCOUNTER — Ambulatory Visit (HOSPITAL_COMMUNITY)
Admission: RE | Admit: 2012-06-26 | Discharge: 2012-06-26 | Disposition: A | Discharge status: Home / Self Care | Payer: Medicare FFS | Source: Ambulatory Visit | Attending: General Surgery | Admitting: General Surgery

## 2012-06-26 ENCOUNTER — Encounter (HOSPITAL_COMMUNITY): Payer: Self-pay | Admitting: *Deleted

## 2012-06-26 ENCOUNTER — Encounter (HOSPITAL_COMMUNITY)
Admission: RE | Disposition: A | Discharge status: Home / Self Care | Payer: Self-pay | Source: Ambulatory Visit | Attending: General Surgery

## 2012-06-26 DIAGNOSIS — K8010 Calculus of gallbladder with chronic cholecystitis without obstruction: Secondary | ICD-10-CM

## 2012-06-26 HISTORY — PX: CHOLECYSTECTOMY: SHX55

## 2012-06-26 SURGERY — LAPAROSCOPIC CHOLECYSTECTOMY WITH INTRAOPERATIVE CHOLANGIOGRAM
Anesthesia: General | Wound class: Contaminated

## 2012-06-26 MED ORDER — ACETAMINOPHEN 325 MG PO TABS: 650.0000 mg | ORAL_TABLET | ORAL | Status: DC | PRN

## 2012-06-26 MED ORDER — SUCCINYLCHOLINE CHLORIDE 20 MG/ML IJ SOLN
INTRAMUSCULAR | Status: DC | PRN
  Administered 2012-06-26: 100 mg via INTRAVENOUS

## 2012-06-26 MED ORDER — MIDAZOLAM HCL 5 MG/5ML IJ SOLN
INTRAMUSCULAR | Status: DC | PRN
  Administered 2012-06-26: 2 mg via INTRAVENOUS

## 2012-06-26 MED ORDER — BUPIVACAINE-EPINEPHRINE PF 0.25-1:200000 % IJ SOLN
INTRAMUSCULAR | Status: AC
  Filled 2012-06-26: qty 30

## 2012-06-26 MED ORDER — GLYCOPYRROLATE 0.2 MG/ML IJ SOLN
INTRAMUSCULAR | Status: DC | PRN
  Administered 2012-06-26: 0.6 mg via INTRAVENOUS

## 2012-06-26 MED ORDER — BUPIVACAINE-EPINEPHRINE PF 0.25-1:200000 % IJ SOLN
INTRAMUSCULAR | Status: DC | PRN
  Administered 2012-06-26: 9 mL

## 2012-06-26 MED ORDER — LACTATED RINGERS IV SOLN
INTRAVENOUS | Status: DC | PRN
  Administered 2012-06-26: 07:00:00 via INTRAVENOUS

## 2012-06-26 MED ORDER — NEOSTIGMINE METHYLSULFATE 1 MG/ML IJ SOLN
INTRAMUSCULAR | Status: DC | PRN
  Administered 2012-06-26: 4 mg via INTRAVENOUS

## 2012-06-26 MED ORDER — PROPOFOL 10 MG/ML IV BOLUS
INTRAVENOUS | Status: DC | PRN
  Administered 2012-06-26: 150 mg via INTRAVENOUS

## 2012-06-26 MED ORDER — LACTATED RINGERS IR SOLN
Status: DC | PRN
  Administered 2012-06-26: 1000 mL

## 2012-06-26 MED ORDER — ACETAMINOPHEN 10 MG/ML IV SOLN
INTRAVENOUS | Status: DC | PRN
  Administered 2012-06-26: 1000 mg via INTRAVENOUS

## 2012-06-26 MED ORDER — ACETAMINOPHEN 10 MG/ML IV SOLN
INTRAVENOUS | Status: AC
  Filled 2012-06-26: qty 100

## 2012-06-26 MED ORDER — ONDANSETRON HCL 4 MG/2ML IJ SOLN: 4.0000 mg | Freq: Four times a day (QID) | INTRAMUSCULAR | Status: DC | PRN

## 2012-06-26 MED ORDER — ONDANSETRON HCL 4 MG/2ML IJ SOLN
INTRAMUSCULAR | Status: DC | PRN
  Administered 2012-06-26: 4 mg via INTRAVENOUS

## 2012-06-26 MED ORDER — CISATRACURIUM BESYLATE (PF) 10 MG/5ML IV SOLN
INTRAVENOUS | Status: DC | PRN
  Administered 2012-06-26: 6 mg via INTRAVENOUS

## 2012-06-26 MED ORDER — FENTANYL CITRATE 0.05 MG/ML IJ SOLN
INTRAMUSCULAR | Status: AC
  Filled 2012-06-26: qty 2

## 2012-06-26 MED ORDER — FENTANYL CITRATE 0.05 MG/ML IJ SOLN
INTRAMUSCULAR | Status: DC | PRN
  Administered 2012-06-26 (×2): 50 ug via INTRAVENOUS

## 2012-06-26 MED ORDER — OXYCODONE HCL 5 MG PO TABS
5.0000 mg | ORAL_TABLET | ORAL | Status: DC | PRN
  Administered 2012-06-26: 5 mg via ORAL
  Filled 2012-06-26: qty 1

## 2012-06-26 MED ORDER — PROMETHAZINE HCL 25 MG/ML IJ SOLN
6.2500 mg | INTRAMUSCULAR | Status: DC | PRN
  Administered 2012-06-26: 6.25 mg via INTRAVENOUS

## 2012-06-26 MED ORDER — SODIUM CHLORIDE 0.9 % IV SOLN: 250.0000 mL | INTRAVENOUS | Status: DC | PRN

## 2012-06-26 MED ORDER — KETOROLAC TROMETHAMINE 30 MG/ML IJ SOLN
15.0000 mg | Freq: Once | INTRAMUSCULAR | Status: AC | PRN
  Administered 2012-06-26: 30 mg via INTRAVENOUS

## 2012-06-26 MED ORDER — SODIUM CHLORIDE 0.9 % IJ SOLN: 3.0000 mL | INTRAMUSCULAR | Status: DC | PRN

## 2012-06-26 MED ORDER — IOHEXOL 300 MG/ML  SOLN
INTRAMUSCULAR | Status: AC
  Filled 2012-06-26: qty 1

## 2012-06-26 MED ORDER — CEFAZOLIN SODIUM-DEXTROSE 2-3 GM-% IV SOLR
INTRAVENOUS | Status: AC
  Filled 2012-06-26: qty 50

## 2012-06-26 MED ORDER — SODIUM CHLORIDE 0.9 % IV SOLN
INTRAVENOUS | Status: DC | PRN
  Administered 2012-06-26: 08:00:00

## 2012-06-26 MED ORDER — OXYCODONE-ACETAMINOPHEN 5-325 MG PO TABS: 1.0000 | ORAL_TABLET | ORAL | Status: DC | PRN

## 2012-06-26 MED ORDER — FENTANYL CITRATE 0.05 MG/ML IJ SOLN
25.0000 ug | INTRAMUSCULAR | Status: DC | PRN
  Administered 2012-06-26: 50 ug via INTRAVENOUS
  Administered 2012-06-26: 25 ug via INTRAVENOUS
  Administered 2012-06-26: 50 ug via INTRAVENOUS
  Administered 2012-06-26: 25 ug via INTRAVENOUS

## 2012-06-26 MED ORDER — ACETAMINOPHEN 650 MG RE SUPP
650.0000 mg | RECTAL | Status: DC | PRN
  Filled 2012-06-26: qty 1

## 2012-06-26 MED ORDER — LACTATED RINGERS IV SOLN: INTRAVENOUS | Status: DC

## 2012-06-26 MED ORDER — SODIUM CHLORIDE 0.9 % IJ SOLN: 3.0000 mL | Freq: Two times a day (BID) | INTRAMUSCULAR | Status: DC

## 2012-06-26 MED ORDER — KETOROLAC TROMETHAMINE 30 MG/ML IJ SOLN
INTRAMUSCULAR | Status: AC
  Filled 2012-06-26: qty 1

## 2012-06-26 MED ORDER — PROMETHAZINE HCL 25 MG/ML IJ SOLN
INTRAMUSCULAR | Status: AC
  Filled 2012-06-26: qty 1

## 2012-06-26 SURGICAL SUPPLY — 44 items
APPLIER CLIP 5 13 M/L LIGAMAX5 (MISCELLANEOUS) ×2
BENZOIN TINCTURE PRP APPL 2/3 (GAUZE/BANDAGES/DRESSINGS) ×2 IMPLANT
CABLE HIGH FREQUENCY MONO STRZ (ELECTRODE) ×2 IMPLANT
CANISTER SUCTION 2500CC (MISCELLANEOUS) ×2 IMPLANT
CHLORAPREP W/TINT 26ML (MISCELLANEOUS) ×2 IMPLANT
CLIP APPLIE 5 13 M/L LIGAMAX5 (MISCELLANEOUS) ×1 IMPLANT
CLOTH BEACON ORANGE TIMEOUT ST (SAFETY) ×2 IMPLANT
COVER MAYO STAND STRL (DRAPES) ×2 IMPLANT
DECANTER SPIKE VIAL GLASS SM (MISCELLANEOUS) ×2 IMPLANT
DEVICE TROCAR PUNCTURE CLOSURE (ENDOMECHANICALS) ×6 IMPLANT
DRAPE C-ARM 42X72 X-RAY (DRAPES) ×2 IMPLANT
DRAPE LAPAROSCOPIC ABDOMINAL (DRAPES) ×2 IMPLANT
DRAPE UTILITY XL STRL (DRAPES) ×2 IMPLANT
ELECT REM PT RETURN 9FT ADLT (ELECTROSURGICAL) ×2
ELECTRODE REM PT RTRN 9FT ADLT (ELECTROSURGICAL) ×1 IMPLANT
FILTER SMOKE EVAC LAPAROSHD (FILTER) ×2 IMPLANT
GAUZE SPONGE 2X2 8PLY STRL LF (GAUZE/BANDAGES/DRESSINGS) ×1 IMPLANT
GLOVE BIO SURGEON STRL SZ7.5 (GLOVE) ×2 IMPLANT
GLOVE BIOGEL PI IND STRL 7.0 (GLOVE) ×2 IMPLANT
GLOVE BIOGEL PI INDICATOR 7.0 (GLOVE) ×2
GLOVE SURG SS PI 6.5 STRL IVOR (GLOVE) ×2 IMPLANT
GLOVE SURG SS PI 7.0 STRL IVOR (GLOVE) ×2 IMPLANT
GOWN STRL NON-REIN LRG LVL3 (GOWN DISPOSABLE) ×2 IMPLANT
GOWN STRL REIN XL XLG (GOWN DISPOSABLE) ×4 IMPLANT
KIT BASIN OR (CUSTOM PROCEDURE TRAY) ×2 IMPLANT
NEEDLE INSUFFLATION 14GA 120MM (NEEDLE) ×2 IMPLANT
NS IRRIG 1000ML POUR BTL (IV SOLUTION) ×2 IMPLANT
RINGERS IRRIG 1000ML POUR BTL (IV SOLUTION) ×2 IMPLANT
SCISSORS LAP 5X35 DISP (ENDOMECHANICALS) ×2 IMPLANT
SET CHOLANGIOGRAPH MIX (MISCELLANEOUS) IMPLANT
SET CHOLANGIOGRAPHY FRANKLIN (SET/KITS/TRAYS/PACK) ×2 IMPLANT
SET IRRIG TUBING LAPAROSCOPIC (IRRIGATION / IRRIGATOR) ×2 IMPLANT
SOLUTION ANTI FOG 6CC (MISCELLANEOUS) ×2 IMPLANT
SPONGE GAUZE 2X2 STER 10/PKG (GAUZE/BANDAGES/DRESSINGS) ×1
SPONGE GAUZE 4X4 12PLY (GAUZE/BANDAGES/DRESSINGS) ×2 IMPLANT
STRIP CLOSURE SKIN 1/2X4 (GAUZE/BANDAGES/DRESSINGS) ×2 IMPLANT
SUT MNCRL AB 4-0 PS2 18 (SUTURE) ×2 IMPLANT
TAPE CLOTH SURG 4X10 WHT LF (GAUZE/BANDAGES/DRESSINGS) ×2 IMPLANT
TOWEL OR 17X26 10 PK STRL BLUE (TOWEL DISPOSABLE) ×2 IMPLANT
TRAY LAP CHOLE (CUSTOM PROCEDURE TRAY) ×2 IMPLANT
TROCAR BLADELESS OPT 5 75 (ENDOMECHANICALS) ×4 IMPLANT
TROCAR XCEL NON-BLD 11X100MML (ENDOMECHANICALS) ×2 IMPLANT
TROCAR XCEL NON-BLD 5MMX100MML (ENDOMECHANICALS) IMPLANT
TUBING INSUFFLATION 10FT LAP (TUBING) ×2 IMPLANT

## 2012-06-26 NOTE — Addendum Note (Signed)
Addendum  created 06/26/12 0915 by Jeffry Vogelsang A Rockell Faulks, CRNA   Modules edited:Anesthesia Flowsheet    

## 2012-06-26 NOTE — H&P (View-Only) (Signed)
Patient ID: Holly Figueroa, female   DOB: 04/05/1966, 46 y.o.   MRN: [id number]  Chief Complaint  Patient presents with  . Pre-op Exam    Evaluate gallbladder    HPI Holly Figueroa is a 46 y.o. female.  Who was referred secondary to gallstones.  The patient was recently seen in [place] ED for evaluation of epigastric pain. He states his to approximate 4 AM. Patient was ruled out for any cardiac event. The patient did have some nausea and without emesis at this time. Patient states the pain in the right upper quadrant has dated back for approximately 2 years. HPI  Past Medical History  Diagnosis Date  . Acute respiratory failure   . Hypokalemia   . CHF (congestive heart failure)   . Mitral stenosis   . Leukocytosis, unspecified   . Tobacco abuse   . Benzodiazepine dependence   . Legionella pneumonia   . Cervical cancer     Past Surgical History  Procedure Date  . Laser ablation of the cervix   . Mole removal   . Nasal sinus surgery   . 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 Cervical conization w/bx 2000    Family History  Problem Relation Age of Onset  . Heart disease Father   . COPD Father   . Heart failure Maternal Grandmother   . Stroke Mother     Social History History  Substance Use Topics  . Smoking status: Current Every Day Smoker -- 2.0 packs/day for 23 years    Types: Cigarettes    Last Attempt to Quit: 09/07/2010  . Smokeless tobacco: Not on file  . Alcohol Use: Yes    Allergies  Allergen Reactions  . Iodinated Diagnostic Agents Hives  . Sulfa Antibiotics Hives  . Dilaudid (Hydromorphone Hcl) Itching and Palpitations    Current Outpatient Prescriptions  Medication Sig Dispense Refill  . ALPRAZolam (XANAX) 1 MG tablet Take 1 mg by mouth at bedtime as needed.      . aspirin 81 MG tablet Take 81 mg by mouth daily.        . FLUoxetine (PROZAC) 20 MG capsule Take 20 mg by mouth daily.        . furosemide (LASIX) 20 MG  tablet Take 20 mg by mouth daily.        . HYDROcodone-acetaminophen (VICODIN) 5-500 MG per tablet Take 1 tablet by mouth every 6 (six) hours as needed.        . lisinopril (PRINIVIL,ZESTRIL) 5 MG tablet Take 5 mg by mouth daily.        . metoprolol tartrate (LOPRESSOR) 25 MG tablet 1/2 twice daily       . omeprazole (PRILOSEC) 20 MG capsule Take 20 mg by mouth daily.        . potassium chloride SA (K-DUR,KLOR-CON) 20 MEQ tablet Take 20 mEq by mouth daily.          Review of Systems Review of Systems  Constitutional: Negative.   HENT: Negative.   Respiratory: Negative.   Cardiovascular: Negative.   Gastrointestinal: Positive for nausea and abdominal pain.    Blood pressure 114/72, pulse 68, temperature 98.2 F (36.8 C), temperature source Temporal, resp. rate 16, height 5' 6" (1.676 m), weight 177 lb 3.2 oz (80.377 kg).  Physical Exam Physical Exam  Constitutional: She is oriented to person, place, and time. She appears well-developed and well-nourished.  HENT:  Head: Normocephalic and atraumatic.  Eyes: Conjunctivae   normal and EOM are normal. Pupils are equal, round, and reactive to light.  Neck: Normal range of motion. Neck supple.  Cardiovascular: Normal rate and normal heart sounds.   Pulmonary/Chest: Effort normal and breath sounds normal.  Abdominal: Soft. Bowel sounds are normal. There is tenderness (ruq). There is no rebound and no guarding.  Neurological: She is alert and oriented to person, place, and time.    Data Reviewed Ultrasound which revealed multiple gallstones  Assessment    46-year-old female with symptomatic cholelithiasis    Plan    1. We'll proceed to the operating room for laparoscope cholecystectomy with intraoperative cholangiogram.  2.All risks and benefits were discussed with the patient, to generally include infection, bleeding, damage to surrounding structures, and recurrence. Alternatives were offered and described.  All questions were  answered and the patient voiced understanding of the procedure and wishes to proceed at this point.        Danalee Flath Jr., Neenah Canter 06/04/2012, 2:59 PM    

## 2012-06-26 NOTE — Op Note (Signed)
Pre Operative Diagnosis: sx gallstones  Post Operative Diagnosis: same  Surgeon: Dr. Ralene Ok   Procedure: lap chole with IOC  Assistant: GETa  Anesthesia: Gen. Endotracheal anesthesia   EBL: <3JQ  Complications:  Counts: reported as correct x 2   Findings: The patient had normal IOC  Indications for procedure: PT is a 47 y/o F who was seen in ED for chest/epigastric pain.  Pt had a cardiac event ruled out.  She had an [place] which revealed gallstones.  She decided to have her gb electively removed.  Details of the procedure:  The patient was taken to the operating and placed in the supine position with bilateral SCDs in place. A time out was called and all facts were verified. A pneumoperitoneum was obtained via A Veress needle technique to a pressure of 68m of mercury. A 594mtrochar was then placed in the right upper quadrant under visualization, and there were no injuries to any abdominal organs. A 11 mm port was then placed in the umbilical region after infiltrating with local anesthesia under direct visualization. A second epigastric port right lower quadrant port placement under direct visualization. The gallbladder was identified and retracted, the peritoneum was then sharply dissected from the gallbladder and this dissection was carried down to Calot's triangle. The gallbladder was identified and stripped away circumferentially and seen going into the gallbladder 360. A Cook catheter was used to perform an intraoperative cholangiogram. The biliary radicals as well as the cystic duct and common bile duct were seen free of filling defects.  2 clips were placed proximally one distally and the cystic duct transected. The cystic artery was identified and 2 clips placed proximally and one distally and transected.  We then proceeded to remove the gallbladder off the hepatic fossa with Bovie cautery. A latex retrieval bag was then placed in the abdomen and gallbladder placed in the bag.  The hepatic fossa was then reexamined and hemostasis was achieved with Bovie cautery and was excellent at the end of the case. There was some bile spillage which was irrigated out.  The subhepatic fossa and perihepatic fossa was then irrigated until the effluent was clear. The 11 mm trocar fascia was reapproximated with the Endo Close #1 Vicryl.  The pneumoperitoneum was evacuated and all trochars removed under direct visulalization.  The skin was then closed with 4-0 Monocryl and the skin dressed with Steri-Strips, gauze, and tape.  The patient was awaken from general anesthesia and taken to the recovery room in stable condition.

## 2012-06-26 NOTE — Interval H&P Note (Signed)
History and Physical Interval Note:  06/26/2012 6:42 AM  Holly Figueroa  has presented today for surgery, with the diagnosis of gallstones  The various methods of treatment have been discussed with the patient and family. After consideration of risks, benefits and other options for treatment, the patient has consented to  Procedure(s) (LRB) with comments: LAPAROSCOPIC CHOLECYSTECTOMY WITH INTRAOPERATIVE CHOLANGIOGRAM (N/A) as a surgical intervention .  The patient's history has been reviewed, patient examined, no change in status, stable for surgery.  I have reviewed the patient's chart and labs.  Questions were answered to the patient's satisfaction.     Rosario Jacks., Anne Hahn

## 2012-06-26 NOTE — Addendum Note (Signed)
Addendum  created 06/26/12 0915 by Bailey Mech, CRNA   Modules edited:Anesthesia Flowsheet

## 2012-06-26 NOTE — Transfer of Care (Signed)
Immediate Anesthesia Transfer of Care Note  Patient: Holly Figueroa  Procedure(s) Performed: Procedure(s) (LRB) with comments: LAPAROSCOPIC CHOLECYSTECTOMY WITH INTRAOPERATIVE CHOLANGIOGRAM (N/A)  Patient Location: PACU  Anesthesia Type:General  Level of Consciousness: awake, oriented, sedated and patient cooperative  Airway & Oxygen Therapy: Patient Spontanous Breathing and Patient connected to face mask oxygen  Post-op Assessment: Report given to PACU RN and Post -op Vital signs reviewed and stable  Post vital signs: Reviewed and stable  Complications: No apparent anesthesia complications

## 2012-06-26 NOTE — Anesthesia Preprocedure Evaluation (Addendum)
Anesthesia Evaluation  Patient identified by MRN, date of birth, ID band Patient awake    Reviewed: Allergy & Precautions, H&P , NPO status , Patient's Chart, lab work & pertinent test results  Airway Mallampati: III TM Distance: <3 FB Neck ROM: Full  Mouth opening: Limited Mouth Opening  Dental No notable dental hx.    Pulmonary Current Smoker,  breath sounds clear to auscultation  Pulmonary exam normal       Cardiovascular hypertension, Pt. on medications Rhythm:Regular Rate:Normal     Neuro/Psych Anxiety negative neurological ROS     GI/Hepatic negative GI ROS, Neg liver ROS,   Endo/Other  negative endocrine ROS  Renal/GU negative Renal ROS  negative genitourinary   Musculoskeletal negative musculoskeletal ROS (+)   Abdominal   Peds negative pediatric ROS (+)  Hematology negative hematology ROS (+)   Anesthesia Other Findings   Reproductive/Obstetrics negative OB ROS                          Anesthesia Physical Anesthesia Plan  ASA: III  Anesthesia Plan: General   Post-op Pain Management:    Induction: Intravenous  Airway Management Planned: Oral ETT  Additional Equipment:   Intra-op Plan:   Post-operative Plan: Extubation in OR  Informed Consent: I have reviewed the patients History and Physical, chart, labs and discussed the procedure including the risks, benefits and alternatives for the proposed anesthesia with the patient or authorized representative who has indicated his/her understanding and acceptance.   Dental advisory given  Plan Discussed with: CRNA and Surgeon  Anesthesia Plan Comments:         Anesthesia Quick Evaluation

## 2012-06-26 NOTE — Anesthesia Postprocedure Evaluation (Signed)
  Anesthesia Post-op Note  Patient: Holly Figueroa  Procedure(s) Performed: Procedure(s) (LRB): LAPAROSCOPIC CHOLECYSTECTOMY WITH INTRAOPERATIVE CHOLANGIOGRAM (N/A)  Patient Location: PACU  Anesthesia Type: General  Level of Consciousness: awake and alert   Airway and Oxygen Therapy: Patient Spontanous Breathing  Post-op Pain: mild  Post-op Assessment: Post-op Vital signs reviewed, Patient's Cardiovascular Status Stable, Respiratory Function Stable, Patent Airway and No signs of Nausea or vomiting  Last Vitals:  Filed Vitals:   06/26/12 0830  BP: 162/73  Pulse: 76  Temp:   Resp: 14    Post-op Vital Signs: stable   Complications: No apparent anesthesia complications

## 2012-06-27 ENCOUNTER — Encounter (HOSPITAL_COMMUNITY): Payer: Self-pay | Admitting: General Surgery

## 2012-07-10 ENCOUNTER — Encounter (INDEPENDENT_AMBULATORY_CARE_PROVIDER_SITE_OTHER): Payer: Self-pay | Admitting: General Surgery

## 2012-07-10 ENCOUNTER — Ambulatory Visit (INDEPENDENT_AMBULATORY_CARE_PROVIDER_SITE_OTHER): Payer: Medicare PPO | Admitting: General Surgery

## 2012-07-10 VITALS — BP 116/70 | HR 68 | Temp 97.9°F | Resp 12 | Ht 66.0 in | Wt 183.6 lb

## 2012-07-10 DIAGNOSIS — Z9049 Acquired absence of other specified parts of digestive tract: Secondary | ICD-10-CM

## 2012-07-10 DIAGNOSIS — Z9089 Acquired absence of other organs: Secondary | ICD-10-CM

## 2012-07-10 NOTE — Progress Notes (Signed)
Patient ID: Holly Figueroa, female   DOB: 09-30-1965, 47 y.o.   MRN: [id number] The patient is a 47 year old female status post llaparoscopiccholecystectomy. Patient has been doing well postoperatively and has regained her appetite. Patient has normal bowel function at this time.  Pathology revealed cholecystitis and cholelithiasis  I discussed with the patient.  On exam: Wounds clean dry and intact  Assessment Plan: Status post cholecystectomy The patient follow up when necessary

## 2012-07-15 ENCOUNTER — Encounter (INDEPENDENT_AMBULATORY_CARE_PROVIDER_SITE_OTHER): Payer: Self-pay

## 2014-10-28 ENCOUNTER — Encounter (HOSPITAL_COMMUNITY): Payer: Self-pay | Admitting: Emergency Medicine

## 2014-10-28 ENCOUNTER — Inpatient Hospital Stay (HOSPITAL_COMMUNITY)
Admission: EM | Admit: 2014-10-28 | Discharge: 2014-11-08 | DRG: 871 | Disposition: A | Discharge status: Home / Self Care | Payer: Medicare FFS | Attending: Pulmonary Disease | Admitting: Pulmonary Disease

## 2014-10-28 ENCOUNTER — Emergency Department (HOSPITAL_COMMUNITY): Payer: Medicare FFS

## 2014-10-28 DIAGNOSIS — G4733 Obstructive sleep apnea (adult) (pediatric): Secondary | ICD-10-CM | POA: Diagnosis present

## 2014-10-28 DIAGNOSIS — N179 Acute kidney failure, unspecified: Secondary | ICD-10-CM | POA: Diagnosis not present

## 2014-10-28 DIAGNOSIS — I10 Essential (primary) hypertension: Secondary | ICD-10-CM | POA: Diagnosis present

## 2014-10-28 DIAGNOSIS — H5702 Anisocoria: Secondary | ICD-10-CM | POA: Diagnosis present

## 2014-10-28 DIAGNOSIS — R0603 Acute respiratory distress: Secondary | ICD-10-CM

## 2014-10-28 DIAGNOSIS — F419 Anxiety disorder, unspecified: Secondary | ICD-10-CM | POA: Diagnosis present

## 2014-10-28 DIAGNOSIS — G9340 Encephalopathy, unspecified: Secondary | ICD-10-CM | POA: Diagnosis not present

## 2014-10-28 DIAGNOSIS — J9690 Respiratory failure, unspecified, unspecified whether with hypoxia or hypercapnia: Secondary | ICD-10-CM

## 2014-10-28 DIAGNOSIS — D649 Anemia, unspecified: Secondary | ICD-10-CM | POA: Diagnosis present

## 2014-10-28 DIAGNOSIS — Z7982 Long term (current) use of aspirin: Secondary | ICD-10-CM

## 2014-10-28 DIAGNOSIS — F329 Major depressive disorder, single episode, unspecified: Secondary | ICD-10-CM | POA: Diagnosis present

## 2014-10-28 DIAGNOSIS — I080 Rheumatic disorders of both mitral and aortic valves: Secondary | ICD-10-CM | POA: Diagnosis present

## 2014-10-28 DIAGNOSIS — A499 Bacterial infection, unspecified: Secondary | ICD-10-CM

## 2014-10-28 DIAGNOSIS — E876 Hypokalemia: Secondary | ICD-10-CM | POA: Diagnosis present

## 2014-10-28 DIAGNOSIS — J80 Acute respiratory distress syndrome: Secondary | ICD-10-CM

## 2014-10-28 DIAGNOSIS — Z720 Tobacco use: Secondary | ICD-10-CM | POA: Diagnosis present

## 2014-10-28 DIAGNOSIS — F17210 Nicotine dependence, cigarettes, uncomplicated: Secondary | ICD-10-CM | POA: Diagnosis present

## 2014-10-28 DIAGNOSIS — R918 Other nonspecific abnormal finding of lung field: Secondary | ICD-10-CM

## 2014-10-28 DIAGNOSIS — A4151 Sepsis due to Escherichia coli [E. coli]: Secondary | ICD-10-CM | POA: Diagnosis not present

## 2014-10-28 DIAGNOSIS — J9601 Acute respiratory failure with hypoxia: Secondary | ICD-10-CM | POA: Diagnosis not present

## 2014-10-28 DIAGNOSIS — J189 Pneumonia, unspecified organism: Secondary | ICD-10-CM | POA: Diagnosis present

## 2014-10-28 DIAGNOSIS — R079 Chest pain, unspecified: Secondary | ICD-10-CM | POA: Diagnosis not present

## 2014-10-28 DIAGNOSIS — R6521 Severe sepsis with septic shock: Secondary | ICD-10-CM | POA: Diagnosis present

## 2014-10-28 DIAGNOSIS — R1310 Dysphagia, unspecified: Secondary | ICD-10-CM | POA: Diagnosis not present

## 2014-10-28 DIAGNOSIS — N390 Urinary tract infection, site not specified: Secondary | ICD-10-CM | POA: Diagnosis present

## 2014-10-28 DIAGNOSIS — Z8541 Personal history of malignant neoplasm of cervix uteri: Secondary | ICD-10-CM

## 2014-10-28 DIAGNOSIS — R0902 Hypoxemia: Secondary | ICD-10-CM

## 2014-10-28 LAB — COMPREHENSIVE METABOLIC PANEL
ALT: 36 U/L (ref 14–54)
AST: 30 U/L (ref 15–41)
Albumin: 3.6 g/dL (ref 3.5–5.0)
Alkaline Phosphatase: 113 U/L (ref 38–126)
Anion gap: 9 (ref 5–15)
BILIRUBIN TOTAL: 0.5 mg/dL (ref 0.3–1.2)
BUN: 15 mg/dL (ref 6–20)
CHLORIDE: 107 mmol/L (ref 101–111)
CO2: 21 mmol/L — AB (ref 22–32)
CREATININE: 1.22 mg/dL — AB (ref 0.44–1.00)
Calcium: 8.7 mg/dL — ABNORMAL LOW (ref 8.9–10.3)
GFR calc Af Amer: >60 mL/min (ref >60)
GFR, EST NON AFRICAN AMERICAN: 52 mL/min — AB (ref >60)
Glucose, Bld: 202 mg/dL — ABNORMAL HIGH (ref 70–99)
Potassium: 3.3 mmol/L — ABNORMAL LOW (ref 3.5–5.1)
SODIUM: 137 mmol/L (ref 135–145)
Total Protein: 7 g/dL (ref 6.5–8.1)

## 2014-10-28 LAB — CBC WITH DIFFERENTIAL/PLATELET
BASOS PCT: 0 % (ref 0–1)
Basophils Absolute: 0 10*3/uL (ref 0.0–0.1)
EOS ABS: 0.1 10*3/uL (ref 0.0–0.7)
EOS PCT: 1 % (ref 0–5)
HEMATOCRIT: 33.7 % — AB (ref 36.0–46.0)
HEMOGLOBIN: 11.3 g/dL — AB (ref 12.0–15.0)
Lymphocytes Relative: 3 % — ABNORMAL LOW (ref 12–46)
Lymphs Abs: 0.4 10*3/uL — ABNORMAL LOW (ref 0.7–4.0)
MCH: 31.4 pg (ref 26.0–34.0)
MCHC: 33.5 g/dL (ref 30.0–36.0)
MCV: 93.6 fL (ref 78.0–100.0)
MONO ABS: 0.5 10*3/uL (ref 0.1–1.0)
MONOS PCT: 4 % (ref 3–12)
Neutro Abs: 10.9 10*3/uL — ABNORMAL HIGH (ref 1.7–7.7)
Neutrophils Relative %: 92 % — ABNORMAL HIGH (ref 43–77)
Platelets: 269 10*3/uL (ref 150–400)
RBC: 3.6 MIL/uL — ABNORMAL LOW (ref 3.87–5.11)
RDW: 12.9 % (ref 11.5–15.5)
WBC: 11.9 10*3/uL — AB (ref 4.0–10.5)

## 2014-10-28 LAB — I-STAT CG4 LACTIC ACID, ED: Lactic Acid, Venous: 1.72 mmol/L (ref 0.5–2.0)

## 2014-10-28 MED ORDER — IBUPROFEN 800 MG PO TABS: 800.0000 mg | ORAL_TABLET | Freq: Once | ORAL | Status: DC

## 2014-10-28 MED ORDER — POTASSIUM CHLORIDE CRYS ER 20 MEQ PO TBCR
40.0000 meq | EXTENDED_RELEASE_TABLET | Freq: Once | ORAL | Status: AC
  Administered 2014-10-29: 40 meq via ORAL
  Filled 2014-10-28: qty 2

## 2014-10-28 MED ORDER — SODIUM CHLORIDE 0.9 % IV BOLUS (SEPSIS)
500.0000 mL | INTRAVENOUS | Status: AC
  Administered 2014-10-29: 500 mL via INTRAVENOUS

## 2014-10-28 MED ORDER — DEXTROSE 5 % IV SOLN
500.0000 mg | Freq: Once | INTRAVENOUS | Status: AC
  Administered 2014-10-28: 500 mg via INTRAVENOUS
  Filled 2014-10-28: qty 500

## 2014-10-28 MED ORDER — ACETAMINOPHEN 650 MG RE SUPP: 650.0000 mg | Freq: Once | RECTAL | Status: DC

## 2014-10-28 MED ORDER — SODIUM CHLORIDE 0.9 % IV BOLUS (SEPSIS)
1000.0000 mL | INTRAVENOUS | Status: AC
  Administered 2014-10-28 – 2014-10-29 (×2): 1000 mL via INTRAVENOUS

## 2014-10-28 MED ORDER — DEXTROSE 5 % IV SOLN
1.0000 g | Freq: Once | INTRAVENOUS | Status: AC
  Administered 2014-10-28: 1 g via INTRAVENOUS
  Filled 2014-10-28: qty 10

## 2014-10-28 NOTE — ED Provider Notes (Signed)
CSN: [id number]     Arrival date & time 10/28/14  2227 History   First MD Initiated Contact with Patient 10/28/14 2242     Chief Complaint  Patient presents with  . Chest Pain  . Fever     (Consider location/radiation/quality/duration/timing/severity/associated sxs/prior Treatment) HPI Comments: Patient is a 49 year old female presents to the emergency department with fever and chest discomfort.  The patient states that on yesterday May for she was doing fine until the afternoon when she began to have shaking chills. She used some ibuprofen, began to feel some better was able to sleep through most of the evening. She felt okay upon arising this morning, but developed chills again noted temperature elevation and took Tylenol and ibuprofen to the day. This evening approximately 7:00 PM she started having a pain in the chest area accompanied by some nausea and shortness of breath. She is noted a cough. And fever has returned. She took additional medication (Tylenol) and came to the emergency department for evaluation. The patient states she has been around a family member who is been treated for "strep throat". It is also of note that the patient has had respiratory failure in the past. 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 She has required intubation, and has been treated for congestive heart failure. Patient is also been treated for pneumonia in the past. She presents now in some distress for additional evaluation and management.  Patient is a 49 y.o. female presenting with chest pain and fever. The history is provided by the patient.  Chest Pain Associated symptoms: cough, fever, headache and shortness of breath   Fever Associated symptoms: chest pain, chills, cough, headaches and rash     Past Medical History  Diagnosis Date  . Acute respiratory failure   . Hypokalemia   . CHF (congestive heart failure)   . Mitral stenosis   . Leukocytosis, unspecified    . Tobacco abuse   . Benzodiazepine dependence   . Legionella pneumonia   . Hypertension   . Cervical cancer   . Depression   . Anxiety   . Panic attacks   . Headache     migraines  . Anemia 08-2008    Blood transfusion   Past Surgical History  Procedure Laterality Date  . Laser ablation of the cervix    . Mole removal    . Nasal sinus surgery    . Cervical conization w/bx  2000  . 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 Cholecystectomy  06/26/2012    Procedure: LAPAROSCOPIC CHOLECYSTECTOMY WITH INTRAOPERATIVE CHOLANGIOGRAM;  Surgeon: Ralene Ok, MD;  Location: [facility];  Service: General;  Laterality: N/A;   Family History  Problem Relation Age of Onset  . Heart disease Father   . COPD Father   . Heart failure Maternal Grandmother   . Stroke Mother    History  Substance Use Topics  . Smoking status: Current Every Day Smoker -- 2.00 packs/day for 23 years    Types: Cigarettes    Last Attempt to Quit: 09/07/2010  . Smokeless tobacco: Never Used  . Alcohol Use: Yes   OB History    No data available     Review of Systems  Constitutional: Positive for fever and chills.  Respiratory: Positive for cough and shortness of breath.   Cardiovascular: Positive for chest pain. Negative for leg swelling.  Skin: Positive for rash.  Neurological: Positive for headaches.  Psychiatric/Behavioral:  The patient is nervous/anxious.        Depression  All other systems reviewed and are negative.     Allergies  Iodinated diagnostic agents; Sulfa antibiotics; and Dilaudid  Home Medications   Prior to Admission medications   Medication Sig Start Date End Date Taking? Authorizing Provider  acetaminophen (TYLENOL) 500 MG tablet Take 1,000 mg by mouth every 6 (six) hours as needed for mild pain or moderate pain.   Yes Historical Provider, MD  ALPRAZolam Duanne Moron) 1 MG tablet Take 1 mg by mouth 5 (five) times daily. anxiety   Yes Historical Provider, MD  aspirin  EC 81 MG tablet Take 81 mg by mouth daily.   Yes Historical Provider, MD  atorvastatin (LIPITOR) 20 MG tablet Take 20 mg by mouth daily.   Yes Historical Provider, MD  FLUoxetine (PROZAC) 40 MG capsule Take 40-80 mg by mouth daily before breakfast.    Yes Historical Provider, MD  furosemide (LASIX) 20 MG tablet Take 20 mg by mouth daily as needed for fluid. Takes 2 tablets   Yes Historical Provider, MD  GARCINIA CAMBOGIA-CHROMIUM PO Take 2 capsules by mouth daily.   Yes Historical Provider, MD  ibuprofen (ADVIL,MOTRIN) 200 MG tablet Take 600 mg by mouth every 6 (six) hours as needed for fever, headache or mild pain.   Yes Historical Provider, MD  omeprazole (PRILOSEC) 20 MG capsule Take 20 mg by mouth daily.     Yes Historical Provider, MD  psyllium (REGULOID) 0.52 G capsule Take 5 capsules by mouth daily.   Yes Historical Provider, MD  traZODone (DESYREL) 50 MG tablet Take 50-100 mg by mouth at bedtime.   Yes Historical Provider, MD  verapamil (VERELAN PM) 120 MG 24 hr capsule Take 120 mg by mouth daily.   Yes Historical Provider, MD  oxyCODONE-acetaminophen (ROXICET) 5-325 MG per tablet Take 1 tablet by mouth every 4 (four) hours as needed for pain. Patient not taking: Reported on 10/28/2014 06/26/12   Ralene Ok, MD   There were no vitals taken for this visit. Physical Exam  HENT:  Face flushed. Airway is patent. No lesions of the posterior pharynx.  Neck: No tracheal deviation present.  Cardiovascular: Tachycardia present.   Murmur heard. No rub appreciated.  Pulmonary/Chest: No stridor. She has rhonchi.  Patient occasionally uses sensory muscles for breathing. Coarse rhonchi present with a few scattered wheezes.  Patient having difficulty speaking in complete sentences.  Musculoskeletal:  No hot joints appreciated.  Lymphadenopathy:    She has no cervical adenopathy.  Skin: No rash noted.    ED Course  Procedures (including critical care time) CRITICAL CARE Performed by:  Lenox Ahr Total critical care time: **62mn.* Critical care time was exclusive of separately billable procedures and treating other patients. Critical care was necessary to treat or prevent imminent or life-threatening deterioration. Critical care was time spent personally by me on the following activities: development of treatment plan with patient and/or surrogate as well as nursing, discussions with consultants, evaluation of patient's response to treatment, examination of patient, obtaining history from patient or surrogate, ordering and performing treatments and interventions, ordering and review of laboratory studies, ordering and review of radiographic studies, pulse oximetry and re-evaluation of patient's condition. Labs Review Labs Reviewed - No data to display  Imaging Review No results found.   EKG Interpretation None      MDM  Temperature 103.2 upon arrival to the emergency department with tachycardia 125, and a low pulse oximetry of 85%. The patient  was initially started on 2 L of oxygen per minute by nasal cannula, the O2 saturation came up to 90-92. The patient was then taken to 3 L of oxygen by nasal cannula the patient is now up to 94% on.  11:39 - Pt speaking easier. Heart rate down to 106. Pulse ox 94 % on room air. Lactic Acid 1.72 wnl.  11:50 potassium 3.3, Creat 1.22 (elevated), CO2 21(low) B/P 117/64. Oral potassium given.  UA - c/w UTI.   12:50AM - Chest Xray c/w bilat pneumonia.  I have discussed the findings with the patient in terms she understands. She is in agreement with admission. Call placed to hospitalist.  Dr Lars Mage will admit to Step-Down unit.   Final diagnoses:  None    **I have reviewed nursing notes, vital signs, and all appropriate lab and imaging results for this patient.Lily Kocher, PA-C 10/29/14 [id number]  Pattricia Boss, MD 10/29/14 1210

## 2014-10-28 NOTE — ED Notes (Signed)
Blood cultures x2 drawn prior to antibiotic administration.

## 2014-10-28 NOTE — ED Notes (Signed)
Pt. O2 88-90% on 2L [place], O2 increased to 3L .

## 2014-10-28 NOTE — ED Notes (Signed)
Pt. Reports chest pain starting tonight with nausea and shortness of breath. Pt. Reports fever x2 days. Pt. Reports taking 3 680m Ibuprofen at 8pm tonight and 10010mof Tylenol at 10pm tonight.

## 2014-10-29 ENCOUNTER — Encounter (HOSPITAL_COMMUNITY): Payer: Self-pay | Admitting: *Deleted

## 2014-10-29 DIAGNOSIS — F419 Anxiety disorder, unspecified: Secondary | ICD-10-CM | POA: Diagnosis present

## 2014-10-29 DIAGNOSIS — A499 Bacterial infection, unspecified: Secondary | ICD-10-CM | POA: Diagnosis not present

## 2014-10-29 DIAGNOSIS — N390 Urinary tract infection, site not specified: Secondary | ICD-10-CM | POA: Diagnosis not present

## 2014-10-29 DIAGNOSIS — R7881 Bacteremia: Secondary | ICD-10-CM | POA: Diagnosis not present

## 2014-10-29 DIAGNOSIS — F17210 Nicotine dependence, cigarettes, uncomplicated: Secondary | ICD-10-CM | POA: Diagnosis present

## 2014-10-29 DIAGNOSIS — A4151 Sepsis due to Escherichia coli [E. coli]: Secondary | ICD-10-CM | POA: Diagnosis present

## 2014-10-29 DIAGNOSIS — Z8541 Personal history of malignant neoplasm of cervix uteri: Secondary | ICD-10-CM | POA: Diagnosis not present

## 2014-10-29 DIAGNOSIS — G9340 Encephalopathy, unspecified: Secondary | ICD-10-CM | POA: Diagnosis not present

## 2014-10-29 DIAGNOSIS — J189 Pneumonia, unspecified organism: Secondary | ICD-10-CM | POA: Diagnosis present

## 2014-10-29 DIAGNOSIS — R6521 Severe sepsis with septic shock: Secondary | ICD-10-CM | POA: Diagnosis present

## 2014-10-29 DIAGNOSIS — G4733 Obstructive sleep apnea (adult) (pediatric): Secondary | ICD-10-CM | POA: Diagnosis not present

## 2014-10-29 DIAGNOSIS — H5702 Anisocoria: Secondary | ICD-10-CM | POA: Diagnosis present

## 2014-10-29 DIAGNOSIS — I10 Essential (primary) hypertension: Secondary | ICD-10-CM | POA: Diagnosis present

## 2014-10-29 DIAGNOSIS — D649 Anemia, unspecified: Secondary | ICD-10-CM | POA: Diagnosis present

## 2014-10-29 DIAGNOSIS — R0902 Hypoxemia: Secondary | ICD-10-CM | POA: Diagnosis not present

## 2014-10-29 DIAGNOSIS — R6520 Severe sepsis without septic shock: Secondary | ICD-10-CM | POA: Diagnosis not present

## 2014-10-29 DIAGNOSIS — F329 Major depressive disorder, single episode, unspecified: Secondary | ICD-10-CM | POA: Diagnosis present

## 2014-10-29 DIAGNOSIS — N179 Acute kidney failure, unspecified: Secondary | ICD-10-CM | POA: Diagnosis not present

## 2014-10-29 DIAGNOSIS — R079 Chest pain, unspecified: Secondary | ICD-10-CM | POA: Diagnosis present

## 2014-10-29 DIAGNOSIS — E876 Hypokalemia: Secondary | ICD-10-CM | POA: Diagnosis not present

## 2014-10-29 DIAGNOSIS — J9601 Acute respiratory failure with hypoxia: Secondary | ICD-10-CM | POA: Diagnosis not present

## 2014-10-29 DIAGNOSIS — Z720 Tobacco use: Secondary | ICD-10-CM | POA: Diagnosis not present

## 2014-10-29 DIAGNOSIS — Z7982 Long term (current) use of aspirin: Secondary | ICD-10-CM | POA: Diagnosis not present

## 2014-10-29 DIAGNOSIS — A419 Sepsis, unspecified organism: Secondary | ICD-10-CM | POA: Diagnosis not present

## 2014-10-29 DIAGNOSIS — J80 Acute respiratory distress syndrome: Secondary | ICD-10-CM | POA: Diagnosis not present

## 2014-10-29 DIAGNOSIS — R1310 Dysphagia, unspecified: Secondary | ICD-10-CM | POA: Diagnosis not present

## 2014-10-29 DIAGNOSIS — I080 Rheumatic disorders of both mitral and aortic valves: Secondary | ICD-10-CM | POA: Diagnosis present

## 2014-10-29 LAB — URINALYSIS, ROUTINE W REFLEX MICROSCOPIC
BILIRUBIN URINE: NEGATIVE
GLUCOSE, UA: NEGATIVE mg/dL
KETONES UR: NEGATIVE mg/dL
Nitrite: POSITIVE — AB
PH: 7 (ref 5.0–8.0)
Specific Gravity, Urine: 1.01 (ref 1.005–1.030)
Urobilinogen, UA: 0.2 mg/dL (ref 0.0–1.0)

## 2014-10-29 LAB — BLOOD GAS, ARTERIAL
ACID-BASE DEFICIT: 4.4 mmol/L — AB (ref 0.0–2.0)
ACID-BASE DEFICIT: 4.8 mmol/L — AB (ref 0.0–2.0)
Bicarbonate: 19.8 mEq/L — ABNORMAL LOW (ref 20.0–24.0)
Bicarbonate: 20.1 mEq/L (ref 20.0–24.0)
Drawn by: 22223
Drawn by: 25788
FIO2: 10 %
O2 CONTENT: 3 L/min
O2 SAT: 92.1 %
O2 SAT: 93.6 %
Patient temperature: 37
Patient temperature: 37
TCO2: 18.2 mmol/L (ref 0–100)
TCO2: 18.6 mmol/L (ref 0–100)
pCO2 arterial: 33.7 mmHg — ABNORMAL LOW (ref 35.0–45.0)
pCO2 arterial: 39.1 mmHg (ref 35.0–45.0)
pH, Arterial: 7.386 (ref 7.350–7.450)
pH, Arterial: 7.332 — ABNORMAL LOW (ref 7.350–7.450)
pO2, Arterial: 65.3 mmHg — ABNORMAL LOW (ref 80.0–100.0)
pO2, Arterial: 74.1 mmHg — ABNORMAL LOW (ref 80.0–100.0)

## 2014-10-29 LAB — CBC
HCT: 29.4 % — ABNORMAL LOW (ref 36.0–46.0)
HEMOGLOBIN: 9.9 g/dL — AB (ref 12.0–15.0)
MCH: 31.9 pg (ref 26.0–34.0)
MCHC: 33.7 g/dL (ref 30.0–36.0)
MCV: 94.8 fL (ref 78.0–100.0)
Platelets: 262 10*3/uL (ref 150–400)
RBC: 3.1 MIL/uL — AB (ref 3.87–5.11)
RDW: 13.1 % (ref 11.5–15.5)
WBC: 18.3 10*3/uL — AB (ref 4.0–10.5)

## 2014-10-29 LAB — COMPREHENSIVE METABOLIC PANEL
ALT: 28 U/L (ref 14–54)
AST: 23 U/L (ref 15–41)
Albumin: 2.8 g/dL — ABNORMAL LOW (ref 3.5–5.0)
Alkaline Phosphatase: 75 U/L (ref 38–126)
Anion gap: 6 (ref 5–15)
BUN: 13 mg/dL (ref 6–20)
CO2: 21 mmol/L — ABNORMAL LOW (ref 22–32)
Calcium: 8 mg/dL — ABNORMAL LOW (ref 8.9–10.3)
Chloride: 113 mmol/L — ABNORMAL HIGH (ref 101–111)
Creatinine, Ser: 1.06 mg/dL — ABNORMAL HIGH (ref 0.44–1.00)
GFR calc Af Amer: >60 mL/min (ref >60)
GFR calc non Af Amer: >60 mL/min (ref >60)
GLUCOSE: 161 mg/dL — AB (ref 70–99)
Potassium: 4 mmol/L (ref 3.5–5.1)
SODIUM: 140 mmol/L (ref 135–145)
TOTAL PROTEIN: 5.8 g/dL — AB (ref 6.5–8.1)
Total Bilirubin: 0.3 mg/dL (ref 0.3–1.2)

## 2014-10-29 LAB — INFLUENZA PANEL BY PCR (TYPE A & B)
H1N1FLUPCR: NOT DETECTED
INFLAPCR: NEGATIVE
Influenza B By PCR: NEGATIVE

## 2014-10-29 LAB — MRSA PCR SCREENING: MRSA by PCR: NEGATIVE

## 2014-10-29 LAB — URINE MICROSCOPIC-ADD ON

## 2014-10-29 LAB — STREP PNEUMONIAE URINARY ANTIGEN: Strep Pneumo Urinary Antigen: NEGATIVE

## 2014-10-29 MED ORDER — CHLORHEXIDINE GLUCONATE 0.12 % MT SOLN
15.0000 mL | Freq: Two times a day (BID) | OROMUCOSAL | Status: DC
  Administered 2014-10-29: 15 mL via OROMUCOSAL
  Filled 2014-10-29: qty 15

## 2014-10-29 MED ORDER — IPRATROPIUM-ALBUTEROL 0.5-2.5 (3) MG/3ML IN SOLN
3.0000 mL | Freq: Once | RESPIRATORY_TRACT | Status: AC
  Administered 2014-10-29: 3 mL via RESPIRATORY_TRACT
  Filled 2014-10-29: qty 3

## 2014-10-29 MED ORDER — POTASSIUM CHLORIDE CRYS ER 20 MEQ PO TBCR
40.0000 meq | EXTENDED_RELEASE_TABLET | Freq: Once | ORAL | Status: AC
  Administered 2014-10-29: 40 meq via ORAL
  Filled 2014-10-29: qty 2

## 2014-10-29 MED ORDER — PANTOPRAZOLE SODIUM 40 MG PO TBEC
40.0000 mg | DELAYED_RELEASE_TABLET | Freq: Every day | ORAL | Status: DC
  Administered 2014-10-29: 40 mg via ORAL
  Filled 2014-10-29: qty 1

## 2014-10-29 MED ORDER — SODIUM CHLORIDE 0.9 % IV SOLN
INTRAVENOUS | Status: DC
  Administered 2014-10-29 – 2014-10-31 (×4): via INTRAVENOUS

## 2014-10-29 MED ORDER — ACETAMINOPHEN 500 MG PO TABS
1000.0000 mg | ORAL_TABLET | Freq: Four times a day (QID) | ORAL | Status: DC | PRN
  Administered 2014-10-29 – 2014-10-30 (×3): 1000 mg via ORAL
  Filled 2014-10-29 (×3): qty 2

## 2014-10-29 MED ORDER — GUAIFENESIN ER 600 MG PO TB12
1200.0000 mg | ORAL_TABLET | Freq: Two times a day (BID) | ORAL | Status: DC
  Administered 2014-10-29 (×2): 1200 mg via ORAL
  Filled 2014-10-29 (×2): qty 2

## 2014-10-29 MED ORDER — TRAZODONE HCL 50 MG PO TABS: 50.0000 mg | ORAL_TABLET | Freq: Every day | ORAL | Status: DC

## 2014-10-29 MED ORDER — IPRATROPIUM-ALBUTEROL 0.5-2.5 (3) MG/3ML IN SOLN
3.0000 mL | RESPIRATORY_TRACT | Status: DC
  Administered 2014-10-29 – 2014-10-30 (×2): 3 mL via RESPIRATORY_TRACT
  Filled 2014-10-29 (×2): qty 3

## 2014-10-29 MED ORDER — CETYLPYRIDINIUM CHLORIDE 0.05 % MT LIQD
7.0000 mL | Freq: Two times a day (BID) | OROMUCOSAL | Status: DC
  Administered 2014-10-29: 7 mL via OROMUCOSAL

## 2014-10-29 MED ORDER — ATORVASTATIN CALCIUM 20 MG PO TABS
20.0000 mg | ORAL_TABLET | Freq: Every day | ORAL | Status: DC
  Administered 2014-10-29: 20 mg via ORAL
  Filled 2014-10-29 (×2): qty 1

## 2014-10-29 MED ORDER — MORPHINE SULFATE 2 MG/ML IJ SOLN
2.0000 mg | INTRAMUSCULAR | Status: DC | PRN
  Administered 2014-10-29 – 2014-10-30 (×4): 2 mg via INTRAVENOUS
  Filled 2014-10-29 (×4): qty 1

## 2014-10-29 MED ORDER — CEFTRIAXONE SODIUM IN DEXTROSE 20 MG/ML IV SOLN
1.0000 g | INTRAVENOUS | Status: DC
  Administered 2014-10-29: 1 g via INTRAVENOUS
  Filled 2014-10-29 (×3): qty 50

## 2014-10-29 MED ORDER — FLUOXETINE HCL 20 MG PO CAPS
40.0000 mg | ORAL_CAPSULE | Freq: Every day | ORAL | Status: DC
  Administered 2014-10-29: 40 mg via ORAL
  Filled 2014-10-29: qty 2

## 2014-10-29 MED ORDER — IPRATROPIUM-ALBUTEROL 0.5-2.5 (3) MG/3ML IN SOLN
3.0000 mL | Freq: Four times a day (QID) | RESPIRATORY_TRACT | Status: DC
  Administered 2014-10-29 (×3): 3 mL via RESPIRATORY_TRACT
  Filled 2014-10-29 (×2): qty 3

## 2014-10-29 MED ORDER — DEXTROSE 5 % IV SOLN
500.0000 mg | INTRAVENOUS | Status: DC
  Administered 2014-10-29: 500 mg via INTRAVENOUS
  Filled 2014-10-29 (×3): qty 500

## 2014-10-29 MED ORDER — IBUPROFEN 400 MG PO TABS
400.0000 mg | ORAL_TABLET | Freq: Once | ORAL | Status: AC
  Administered 2014-10-29: 400 mg via ORAL
  Filled 2014-10-29: qty 1

## 2014-10-29 MED ORDER — ALPRAZOLAM 0.5 MG PO TABS
1.0000 mg | ORAL_TABLET | Freq: Every day | ORAL | Status: DC
  Administered 2014-10-29 (×5): 1 mg via ORAL
  Filled 2014-10-29 (×5): qty 2

## 2014-10-29 MED ORDER — ASPIRIN EC 81 MG PO TBEC
81.0000 mg | DELAYED_RELEASE_TABLET | Freq: Every day | ORAL | Status: DC
  Administered 2014-10-29: 81 mg via ORAL
  Filled 2014-10-29 (×2): qty 1

## 2014-10-29 MED ORDER — FLUOXETINE HCL 40 MG PO CAPS: 40.0000 mg | ORAL_CAPSULE | Freq: Every day | ORAL | Status: DC

## 2014-10-29 MED ORDER — MORPHINE SULFATE 2 MG/ML IJ SOLN
2.0000 mg | Freq: Once | INTRAMUSCULAR | Status: AC
  Administered 2014-10-29: 2 mg via INTRAVENOUS
  Filled 2014-10-29: qty 1

## 2014-10-29 MED ORDER — VERAPAMIL HCL ER 120 MG PO CP24
120.0000 mg | ORAL_CAPSULE | Freq: Every day | ORAL | Status: DC
  Administered 2014-10-29: 120 mg via ORAL
  Filled 2014-10-29 (×4): qty 1

## 2014-10-29 NOTE — H&P (Signed)
PCP:   PROVIDER NOT IN SYSTEM   Chief Complaint:  Fever  HPI: 49 year old female who   has a past medical history of Acute respiratory failure; Hypokalemia; CHF (congestive heart failure); Mitral stenosis; Leukocytosis, unspecified; Tobacco abuse; Benzodiazepine dependence; Legionella pneumonia; Hypertension; Cervical cancer; Depression; Anxiety; Panic attacks; Headache(784.0); and Anemia (08-2008). Today came to the hospital after patient developed fever with chest discomfort at home. Patient says that yesterday afternoon he suddenly started having shaking chills with fever. She also complained of nausea and shortness of breath. She was coughing but was not coughing up any phlegm. Patient has a history of rheumatic valve or heart disease and underwent valve repair in 2010. In the ED patient was found to be hypoxic with O2 sats dropping to 80s and chest x-ray shows multifocal bilateral pneumonia. Patient received 1 dose of Rocephin and Zithromax. Blood cultures have been obtained. He will also is showing positive nitrite. Patient does complain of urgency and mild dysuria. She denies any chest pain. No diarrhea. In the ED patient started coughing up red colored phlegm. Patient complains of generalized body aches. She does smoke one pack of cigarettes a day.  Allergies:   Allergies  Allergen Reactions  . Iodinated Diagnostic Agents Hives  . Sulfa Antibiotics Hives  . Dilaudid [Hydromorphone Hcl] Itching and Palpitations  . Hydrocodone Palpitations      Past Medical History  Diagnosis Date  . Acute respiratory failure   . Hypokalemia   . CHF (congestive heart failure)   . Mitral stenosis   . Leukocytosis, unspecified   . Tobacco abuse   . Benzodiazepine dependence   . Legionella pneumonia   . Hypertension   . Cervical cancer   . Depression   . Anxiety   . Panic attacks   . Headache(784.0)     migraines  . Anemia 08-2008    Blood transfusion    Past Surgical History    Procedure Laterality Date  . Laser ablation of the cervix    . Mole removal    . Nasal sinus surgery    . Cervical conization w/bx  2000  . 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 Cholecystectomy  06/26/2012    Procedure: LAPAROSCOPIC CHOLECYSTECTOMY WITH INTRAOPERATIVE CHOLANGIOGRAM;  Surgeon: Ralene Ok, MD;  Location: [facility];  Service: General;  Laterality: N/A;    Prior to Admission medications   Medication Sig Start Date End Date Taking? Authorizing Provider  acetaminophen (TYLENOL) 500 MG tablet Take 1,000 mg by mouth every 6 (six) hours as needed for mild pain or moderate pain.   Yes Historical Provider, MD  ALPRAZolam Duanne Moron) 1 MG tablet Take 1 mg by mouth 5 (five) times daily. anxiety   Yes Historical Provider, MD  aspirin EC 81 MG tablet Take 81 mg by mouth daily.   Yes Historical Provider, MD  atorvastatin (LIPITOR) 20 MG tablet Take 20 mg by mouth daily.   Yes Historical Provider, MD  FLUoxetine (PROZAC) 40 MG capsule Take 40-80 mg by mouth daily before breakfast.    Yes Historical Provider, MD  furosemide (LASIX) 20 MG tablet Take 20 mg by mouth daily as needed for fluid. Takes 2 tablets   Yes Historical Provider, MD  GARCINIA CAMBOGIA-CHROMIUM PO Take 2 capsules by mouth daily.   Yes Historical Provider, MD  ibuprofen (ADVIL,MOTRIN) 200 MG tablet Take 600 mg by mouth every 6 (six) hours as needed for fever, headache or mild pain.   Yes Historical  Provider, MD  omeprazole (PRILOSEC) 20 MG capsule Take 20 mg by mouth daily.     Yes Historical Provider, MD  psyllium (REGULOID) 0.52 G capsule Take 5 capsules by mouth daily.   Yes Historical Provider, MD  traZODone (DESYREL) 50 MG tablet Take 50-100 mg by mouth at bedtime.   Yes Historical Provider, MD  verapamil (VERELAN PM) 120 MG 24 hr capsule Take 120 mg by mouth daily.   Yes Historical Provider, MD  oxyCODONE-acetaminophen (ROXICET) 5-325 MG per tablet Take 1 tablet by mouth every 4 (four) hours as  needed for pain. Patient not taking: Reported on 10/28/2014 06/26/12   Ralene Ok, MD    Social History:  reports that she has been smoking Cigarettes.  She has a 23 pack-year smoking history. She has never used smokeless tobacco. She reports that she does not drink alcohol or use illicit drugs.  Family History  Problem Relation Age of Onset  . Heart disease Father   . COPD Father   . Heart failure Maternal Grandmother   . Stroke Mother      All the positives are listed in BOLD  Review of Systems:  HEENT: Headache, blurred vision, runny nose, sore throat Neck: Hypothyroidism, hyperthyroidism,,lymphadenopathy Chest : Shortness of breath, history of COPD, Asthma Heart : Chest pain, history of coronary arterey disease GI:  Nausea, vomiting, diarrhea, constipation, GERD GU: Dysuria, urgency, frequency of urination, hematuria Neuro: Stroke, seizures, syncope Psych: Depression, anxiety, hallucinations   Physical Exam: Blood pressure 108/57, pulse 106, temperature 99.1 F (37.3 C), temperature source Oral, resp. rate 34, height _0  (1.676 m), weight 72.576 kg (160 lb), SpO2 92 %. Constitutional:   Patient is a well-developed and well-nourished female in no acute distress and cooperative with exam. Head: Normocephalic and atraumatic Mouth: Mucus membranes moist Eyes: PERRL, EOMI, conjunctivae normal Neck: Supple, No Thyromegaly Cardiovascular: RRR, S1 normal, S2 normal Pulmonary/Chest: Bilateral rhonchi Abdominal: Soft. Non-tender, non-distended, bowel sounds are normal, no masses, organomegaly, or guarding present.  Neurological: A&O x3, Strength is normal and symmetric bilaterally, cranial nerve II-XII are grossly intact, no focal motor deficit, sensory intact to light touch bilaterally.  Extremities : No Cyanosis, Clubbing or Edema  Labs on Admission:  Basic Metabolic Panel:  Recent Labs Lab 10/28/14 2310  NA 137  K 3.3*  CL 107  CO2 21*  GLUCOSE 202*  BUN 15    CREATININE 1.22*  CALCIUM 8.7*   Liver Function Tests:  Recent Labs Lab 10/28/14 2310  AST 30  ALT 36  ALKPHOS 113  BILITOT 0.5  PROT 7.0  ALBUMIN 3.6   CBC:  Recent Labs Lab 10/28/14 2310  WBC 11.9*  NEUTROABS 10.9*  HGB 11.3*  HCT 33.7*  MCV 93.6  PLT 269   Radiological Exams on Admission: Dg Chest Port 1 View  10/29/2014   CLINICAL DATA:  Chest pain, onset tonight. Dyspnea. Fever for 2 days.  EXAM: PORTABLE CHEST - 1 VIEW  COMPARISON:  06/19/2012  FINDINGS: Diffuse airspace opacities in the left lung, and more patchy airspace opacity in the right medial base, likely represent infectious infiltrate. No large effusion is evident. Heart size unremarkable and unchanged.  IMPRESSION: Multifocal airspace opacities, suspicious for infectious infiltrate in this clinical setting. Followup PA and lateral chest X-ray is recommended in 3-4 weeks following trial of antibiotic therapy to ensure resolution and exclude underlying malignancy.   Electronically Signed   By: Andreas Newport M.D.   On: 10/29/2014 00:28    EKG: Independently  reviewed. Sinus tachycardia   Assessment/Plan Active Problems:   Tobacco abuse   Bilateral pneumonia   CAP (community acquired pneumonia)   Hypokalemia  Community acquired pneumonia Patient presenting with respiratory distress, chest x-ray showing bilateral patchy airspace opacities. Will continue with Rocephin and Zithromax. Will follow blood culture results, also obtain urine for Legionella antigen, strep pneumo antigen and sputum culture.  Hypokalemia Replace potassium  Generalized body aches Secondary to pneumonia, will start morphine 2 mg every 6 hours when necessary for pain  ? UTI UA shows positive nitrite, with 21 WBCs per high-power field. Patient has been started on IV Rocephin. Will follow urine culture results.  Code status: Full code  Family discussion: No family at bedside.   Time Spent on Admission: *60 min  [facility] Pager: [phone] 10/29/2014, 1:39 AM  If 7PM-7AM, please contact night-coverage  [email or web address]  Password [email or web address]

## 2014-10-29 NOTE — ED Notes (Signed)
Dr. Darrick Meigs at bedside.

## 2014-10-29 NOTE — Progress Notes (Signed)
ANTIBIOTIC CONSULT NOTE  Pharmacy Consult for Rocephin & Zithromax Indication: pneumonia  Allergies  Allergen Reactions  . Iodinated Diagnostic Agents Hives  . Sulfa Antibiotics Hives  . Dilaudid [Hydromorphone Hcl] Itching and Palpitations  . Hydrocodone Palpitations    Patient Measurements: Height: _0  (167.6 cm) Weight: 174 lb 9.7 oz (79.2 kg) IBW/kg (Calculated) : 59.3  Vital Signs: Temp: 98.1 F (36.7 C) (05/06 0400) Temp Source: Oral (05/06 0400) BP: 97/65 mmHg (05/06 0500) Pulse Rate: 91 (05/06 0500) Intake/Output from previous day: 05/05 0701 - 05/06 0700 In: -  Out: 400 [Urine:400] Intake/Output from this shift:    Labs:  Recent Labs  10/28/14 2310 10/29/14 0439  WBC 11.9* 18.3*  HGB 11.3* 9.9*  PLT 269 262  CREATININE 1.22* 1.06*   Estimated Creatinine Clearance: 69.7 mL/min (by C-G formula based on Cr of 1.06). No results for input(s): VANCOTROUGH, VANCOPEAK, VANCORANDOM, GENTTROUGH, GENTPEAK, GENTRANDOM, TOBRATROUGH, TOBRAPEAK, TOBRARND, AMIKACINPEAK, AMIKACINTROU, AMIKACIN in the last 72 hours.   Microbiology: Recent Results (from the past 720 hour(s))  Blood Culture (routine x 2)     Status: None (Preliminary result)   Collection Time: 10/28/14 10:50 PM  Result Value Ref Range Status   Specimen Description BLOOD RIGHT FOREARM  Final   Special Requests BOTTLES DRAWN AEROBIC AND ANAEROBIC 12CC EACH  Final   Culture NO GROWTH 1 DAY  Final   Report Status PENDING  Incomplete  Blood Culture (routine x 2)     Status: None (Preliminary result)   Collection Time: 10/28/14 11:10 PM  Result Value Ref Range Status   Specimen Description BLOOD LEFT FOREARM  Final   Special Requests BOTTLES DRAWN AEROBIC AND ANAEROBIC 11CC EACH  Final   Culture NO GROWTH 1 DAY  Final   Report Status PENDING  Incomplete  MRSA PCR Screening     Status: None   Collection Time: 10/29/14  2:30 AM  Result Value Ref Range Status   MRSA by PCR NEGATIVE NEGATIVE Final   Comment:        The GeneXpert MRSA Assay (FDA approved for NASAL specimens only), is one component of a comprehensive MRSA colonization surveillance program. It is not intended to diagnose MRSA infection nor to guide or monitor treatment for MRSA infections.     Anti-infectives    Start     Dose/Rate Route Frequency Ordered Stop   10/29/14 2200  cefTRIAXone (ROCEPHIN) 1 g in dextrose 5 % 50 mL IVPB - Premix     1 g 100 mL/hr over 30 Minutes Intravenous Every 24 hours 10/29/14 0740     10/29/14 2200  azithromycin (ZITHROMAX) 500 mg in dextrose 5 % 250 mL IVPB     500 mg 250 mL/hr over 60 Minutes Intravenous Every 24 hours 10/29/14 0740     10/28/14 2315  cefTRIAXone (ROCEPHIN) 1 g in dextrose 5 % 50 mL IVPB     1 g 100 mL/hr over 30 Minutes Intravenous  Once 10/28/14 2302 10/29/14 0001   10/28/14 2315  azithromycin (ZITHROMAX) 500 mg in dextrose 5 % 250 mL IVPB     500 mg 250 mL/hr over 60 Minutes Intravenous  Once 10/28/14 2302 10/29/14 0030      Assessment: 49 yo F who presented with fever, chills, and hypoxia.  CXR + PNA.  WBC increased today.  Initial antibiotic doses given in ED.  Renal function is trending to baseline.  Rocephin & Zithromax do not require renal dose adjustment.    Goal of Therapy:  Eradicate infection.  Plan:  Rocephin 1gm IV q24h Zithromax 566m IV q24h Change Zithromax to PO when appropriate Duration of therapy per MD No further dose adjustments anticipated.  Pharmacy to sign off- Please re-consult if needed  LKadie, Bal[place]i5/11/2014,7:45 AM

## 2014-10-29 NOTE — Progress Notes (Signed)
Patient has had increased respiratory distress thruout the day requiring Bipap. She is maintaining sats. Remains tachypneic but see no need for intubation at present. Will need to be closely monitored thru the night. ABG with pO2 of 74, pCO2 39 and pH 7.33.  Domingo Mend, MD [facility] Pager: [phone]

## 2014-10-29 NOTE — Progress Notes (Signed)
Holly Figueroa is notified about patient's fever 103.1 ax., and increased respiration rate in 50s. Patient currently denies shortness of breath. Continue to monitor the patient

## 2014-10-29 NOTE — Care Management Note (Signed)
Case Management Note  Patient Details  Name: Holly Figueroa MRN: [id number] Date of Birth: Jan 19, 1966   Expected Discharge Date:  11/02/14               Expected Discharge Plan:  Home/Self Care  In-House Referral:  NA  Discharge planning Services  CM Consult  Post Acute Care Choice:  Durable Medical Equipment Choice offered to:  Patient  DME Arranged:  Oxygen DME Agency:  [place]  HH Arranged:    [place] Agency:     Status of Service:  Completed, signed off  Medicare Important Message Given:  Yes Date Medicare IM Given:  10/29/14 Medicare IM give by:  Jolene Provost, RN, MSN, CM Date Additional Medicare IM Given:    Additional Medicare Important Message give by:     If discussed at [place] of Stay Meetings, dates discussed:    Additional Comments: Pt is from home, lives alone and independent at baseline. Pt has a neb machine but no other DME's or [place] services prior to admission. DC not anticipated over weekend. If pt does DC and requires home O2, pt's RN to document O2 qualifications and notify [facility] of DME need. If pt is not discharged home over weekend, will cont to follow on Monday.   Sherald Barge, RN 10/29/2014, 12:41 PM

## 2014-10-29 NOTE — Progress Notes (Signed)
Patient seen and examined. Admitted earlier today for fever and shaking chills. Found to have pneumonia on chest x-ray as well as a UTI. Agree with current antibiotics of Rocephin and azithromycin. She is indeed hypoxic but is maintaining sats above 92% with nasal cannula oxygen. Believe she is stable for transfer to the floor. We'll continue to follow.  Domingo Mend, MD [facility] Pager: [phone]

## 2014-10-30 ENCOUNTER — Inpatient Hospital Stay (HOSPITAL_COMMUNITY): Payer: Medicare FFS

## 2014-10-30 DIAGNOSIS — J80 Acute respiratory distress syndrome: Secondary | ICD-10-CM

## 2014-10-30 DIAGNOSIS — Z720 Tobacco use: Secondary | ICD-10-CM

## 2014-10-30 LAB — GLUCOSE, CAPILLARY: GLUCOSE-CAPILLARY: 161 mg/dL — AB (ref 70–99)

## 2014-10-30 LAB — POCT I-STAT 3, ART BLOOD GAS (G3+)
ACID-BASE DEFICIT: 7 mmol/L — AB (ref 0.0–2.0)
Bicarbonate: 17.1 mEq/L — ABNORMAL LOW (ref 20.0–24.0)
O2 SAT: 99 %
Patient temperature: 102
TCO2: 18 mmol/L (ref 0–100)
pCO2 arterial: 32 mmHg — ABNORMAL LOW (ref 35.0–45.0)
pH, Arterial: 7.346 — ABNORMAL LOW (ref 7.350–7.450)
pO2, Arterial: 153 mmHg — ABNORMAL HIGH (ref 80.0–100.0)

## 2014-10-30 LAB — BLOOD GAS, ARTERIAL
Acid-base deficit: 8.2 mmol/L — ABNORMAL HIGH (ref 0.0–2.0)
Bicarbonate: 17.6 mEq/L — ABNORMAL LOW (ref 20.0–24.0)
Drawn by: 38235
FIO2: 1 %
MECHVT: 530 mL
O2 Saturation: 84 %
PATIENT TEMPERATURE: 37
PEEP: 12 cmH2O
PH ART: 7.259 — AB (ref 7.350–7.450)
RATE: 20 resp/min
TCO2: 16.5 mmol/L (ref 0–100)
pCO2 arterial: 40.8 mmHg (ref 35.0–45.0)
pO2, Arterial: 56 mmHg — ABNORMAL LOW (ref 80.0–100.0)

## 2014-10-30 LAB — PROCALCITONIN: Procalcitonin: 14.91 ng/mL

## 2014-10-30 LAB — TRIGLYCERIDES: TRIGLYCERIDES: 174 mg/dL — AB (ref <150)

## 2014-10-30 MED ORDER — FUROSEMIDE 10 MG/ML IJ SOLN
40.0000 mg | Freq: Once | INTRAMUSCULAR | Status: AC
  Administered 2014-10-30: 40 mg via INTRAVENOUS
  Filled 2014-10-30: qty 4

## 2014-10-30 MED ORDER — ATORVASTATIN CALCIUM 20 MG PO TABS
20.0000 mg | ORAL_TABLET | Freq: Every day | ORAL | Status: DC
  Administered 2014-10-30 – 2014-10-31 (×2): 20 mg
  Filled 2014-10-30 (×3): qty 1

## 2014-10-30 MED ORDER — FENTANYL CITRATE (PF) 100 MCG/2ML IJ SOLN: 100.0000 ug | INTRAMUSCULAR | Status: DC | PRN

## 2014-10-30 MED ORDER — FENTANYL CITRATE (PF) 100 MCG/2ML IJ SOLN: 100.0000 ug | Freq: Once | INTRAMUSCULAR | Status: DC

## 2014-10-30 MED ORDER — MIDAZOLAM BOLUS VIA INFUSION
1.0000 mg | INTRAVENOUS | Status: DC | PRN
  Filled 2014-10-30: qty 2

## 2014-10-30 MED ORDER — ETOMIDATE 2 MG/ML IV SOLN
INTRAVENOUS | Status: AC
  Administered 2014-10-30: 25 mg
  Filled 2014-10-30: qty 20

## 2014-10-30 MED ORDER — CISATRACURIUM BOLUS VIA INFUSION
0.0500 mg/kg | Freq: Once | INTRAVENOUS | Status: AC
  Administered 2014-10-30: 4 mg via INTRAVENOUS
  Filled 2014-10-30: qty 4

## 2014-10-30 MED ORDER — MIDAZOLAM HCL 2 MG/2ML IJ SOLN: 2.0000 mg | INTRAMUSCULAR | Status: DC | PRN

## 2014-10-30 MED ORDER — FENTANYL BOLUS VIA INFUSION
50.0000 ug | INTRAVENOUS | Status: DC | PRN
  Filled 2014-10-30: qty 50

## 2014-10-30 MED ORDER — FENTANYL CITRATE (PF) 100 MCG/2ML IJ SOLN: 100.0000 ug | Freq: Once | INTRAMUSCULAR | Status: AC | PRN

## 2014-10-30 MED ORDER — PROPOFOL 1000 MG/100ML IV EMUL
INTRAVENOUS | Status: AC
  Filled 2014-10-30: qty 100

## 2014-10-30 MED ORDER — MIDAZOLAM HCL 2 MG/2ML IJ SOLN
INTRAMUSCULAR | Status: AC
  Administered 2014-10-30: 2 mg
  Filled 2014-10-30: qty 2

## 2014-10-30 MED ORDER — LIDOCAINE HCL (CARDIAC) 20 MG/ML IV SOLN
INTRAVENOUS | Status: AC
  Filled 2014-10-30: qty 5

## 2014-10-30 MED ORDER — SUCCINYLCHOLINE CHLORIDE 20 MG/ML IJ SOLN
INTRAMUSCULAR | Status: AC
  Administered 2014-10-30: 10 mg
  Filled 2014-10-30: qty 1

## 2014-10-30 MED ORDER — IPRATROPIUM-ALBUTEROL 0.5-2.5 (3) MG/3ML IN SOLN: 3.0000 mL | RESPIRATORY_TRACT | Status: DC | PRN

## 2014-10-30 MED ORDER — DOCUSATE SODIUM 50 MG/5ML PO LIQD
100.0000 mg | Freq: Two times a day (BID) | ORAL | Status: DC | PRN
  Filled 2014-10-30: qty 10

## 2014-10-30 MED ORDER — FAMOTIDINE 20 MG PO TABS
20.0000 mg | ORAL_TABLET | Freq: Every day | ORAL | Status: DC
  Filled 2014-10-30: qty 1

## 2014-10-30 MED ORDER — FENTANYL CITRATE (PF) 2500 MCG/50ML IJ SOLN
INTRAMUSCULAR | Status: AC
Start: 2014-10-30 — End: 2014-10-30
  Filled 2014-10-30: qty 50

## 2014-10-30 MED ORDER — LEVOFLOXACIN IN D5W 750 MG/150ML IV SOLN
750.0000 mg | INTRAVENOUS | Status: DC
  Administered 2014-10-30: 750 mg via INTRAVENOUS
  Filled 2014-10-30 (×2): qty 150

## 2014-10-30 MED ORDER — PROPOFOL 1000 MG/100ML IV EMUL: 0.0000 ug/kg/min | INTRAVENOUS | Status: DC

## 2014-10-30 MED ORDER — ASPIRIN 81 MG PO CHEW
CHEWABLE_TABLET | ORAL | Status: AC
  Filled 2014-10-30: qty 1

## 2014-10-30 MED ORDER — PANTOPRAZOLE SODIUM 40 MG PO PACK
40.0000 mg | PACK | ORAL | Status: DC
  Administered 2014-10-30 – 2014-11-01 (×3): 40 mg
  Filled 2014-10-30 (×4): qty 20

## 2014-10-30 MED ORDER — SODIUM CHLORIDE 0.9 % IV SOLN: 250.0000 mL | INTRAVENOUS | Status: DC | PRN

## 2014-10-30 MED ORDER — ACETAMINOPHEN 160 MG/5ML PO SOLN
650.0000 mg | Freq: Four times a day (QID) | ORAL | Status: DC | PRN
  Administered 2014-10-31: 650 mg via ORAL
  Filled 2014-10-30: qty 20.3

## 2014-10-30 MED ORDER — ROCURONIUM BROMIDE 50 MG/5ML IV SOLN
INTRAVENOUS | Status: AC
  Filled 2014-10-30: qty 2

## 2014-10-30 MED ORDER — SODIUM CHLORIDE 0.9 % IV SOLN
25.0000 ug/h | INTRAVENOUS | Status: DC
  Administered 2014-10-30: 50 ug/h via INTRAVENOUS
  Filled 2014-10-30: qty 50

## 2014-10-30 MED ORDER — VANCOMYCIN HCL IN DEXTROSE 1-5 GM/200ML-% IV SOLN
1000.0000 mg | Freq: Two times a day (BID) | INTRAVENOUS | Status: DC
  Administered 2014-10-30 – 2014-10-31 (×2): 1000 mg via INTRAVENOUS
  Filled 2014-10-30 (×3): qty 200

## 2014-10-30 MED ORDER — PANTOPRAZOLE SODIUM 40 MG IV SOLR
40.0000 mg | INTRAVENOUS | Status: DC
  Administered 2014-10-30: 40 mg via INTRAVENOUS
  Filled 2014-10-30: qty 40

## 2014-10-30 MED ORDER — QUETIAPINE FUMARATE 25 MG PO TABS: 25.0000 mg | ORAL_TABLET | Freq: Two times a day (BID) | ORAL | Status: DC

## 2014-10-30 MED ORDER — CHLORHEXIDINE GLUCONATE 0.12 % MT SOLN
15.0000 mL | Freq: Two times a day (BID) | OROMUCOSAL | Status: DC
  Administered 2014-10-30 – 2014-11-02 (×7): 15 mL via OROMUCOSAL
  Filled 2014-10-30 (×7): qty 15

## 2014-10-30 MED ORDER — FENTANYL CITRATE (PF) 100 MCG/2ML IJ SOLN
50.0000 ug | Freq: Once | INTRAMUSCULAR | Status: AC
  Administered 2014-10-30: 50 ug via INTRAVENOUS

## 2014-10-30 MED ORDER — FENTANYL CITRATE (PF) 100 MCG/2ML IJ SOLN
INTRAMUSCULAR | Status: DC
Start: 2014-10-30 — End: 2014-10-30
  Filled 2014-10-30: qty 2

## 2014-10-30 MED ORDER — CETYLPYRIDINIUM CHLORIDE 0.05 % MT LIQD
7.0000 mL | Freq: Four times a day (QID) | OROMUCOSAL | Status: DC
  Administered 2014-10-30 – 2014-11-02 (×12): 7 mL via OROMUCOSAL

## 2014-10-30 MED ORDER — FENTANYL CITRATE (PF) 100 MCG/2ML IJ SOLN
100.0000 ug | INTRAMUSCULAR | Status: DC | PRN
  Filled 2014-10-30: qty 2

## 2014-10-30 MED ORDER — SODIUM CHLORIDE 0.9 % IV SOLN
0.0000 mg/h | INTRAVENOUS | Status: DC
  Administered 2014-10-30: 2 mg/h via INTRAVENOUS
  Administered 2014-10-31: 1 mg/h via INTRAVENOUS
  Filled 2014-10-30 (×2): qty 10

## 2014-10-30 MED ORDER — ARTIFICIAL TEARS OP OINT
1.0000 "application " | TOPICAL_OINTMENT | Freq: Three times a day (TID) | OPHTHALMIC | Status: DC
  Administered 2014-10-30 – 2014-11-01 (×7): 1 via OPHTHALMIC
  Filled 2014-10-30: qty 3.5

## 2014-10-30 MED ORDER — PROPOFOL 1000 MG/100ML IV EMUL
0.0000 ug/kg/min | INTRAVENOUS | Status: DC
  Administered 2014-10-30: 10 ug/kg/min via INTRAVENOUS

## 2014-10-30 MED ORDER — NOREPINEPHRINE BITARTRATE 1 MG/ML IV SOLN
2.0000 ug/min | INTRAVENOUS | Status: DC
  Administered 2014-10-30: 8 ug/min via INTRAVENOUS
  Administered 2014-11-01: 2 ug/min via INTRAVENOUS
  Filled 2014-10-30 (×2): qty 4

## 2014-10-30 MED ORDER — HALOPERIDOL LACTATE 5 MG/ML IJ SOLN: 2.5000 mg | Freq: Four times a day (QID) | INTRAMUSCULAR | Status: DC | PRN

## 2014-10-30 MED ORDER — VANCOMYCIN HCL 10 G IV SOLR
1500.0000 mg | Freq: Once | INTRAVENOUS | Status: AC
  Administered 2014-10-30: 1500 mg via INTRAVENOUS
  Filled 2014-10-30: qty 1500

## 2014-10-30 MED ORDER — PERFLUTREN LIPID MICROSPHERE
1.0000 mL | INTRAVENOUS | Status: AC | PRN
  Administered 2014-10-30: 2 mL via INTRAVENOUS
  Filled 2014-10-30: qty 10

## 2014-10-30 MED ORDER — HEPARIN SODIUM (PORCINE) 5000 UNIT/ML IJ SOLN
5000.0000 [IU] | Freq: Three times a day (TID) | INTRAMUSCULAR | Status: DC
  Administered 2014-10-30 – 2014-11-03 (×13): 5000 [IU] via SUBCUTANEOUS
  Filled 2014-10-30 (×14): qty 1

## 2014-10-30 MED ORDER — SODIUM CHLORIDE 0.9 % IV SOLN: INTRAVENOUS | Status: DC | PRN

## 2014-10-30 MED ORDER — FENTANYL CITRATE (PF) 100 MCG/2ML IJ SOLN
100.0000 ug | Freq: Once | INTRAMUSCULAR | Status: AC
  Administered 2014-10-30: 100 ug via INTRAVENOUS

## 2014-10-30 MED ORDER — SODIUM CHLORIDE 0.9 % IV SOLN
25.0000 ug/h | INTRAVENOUS | Status: DC
  Administered 2014-10-30: 50 ug/h via INTRAVENOUS
  Administered 2014-10-30 – 2014-10-31 (×2): 200 ug/h via INTRAVENOUS
  Administered 2014-10-31: 175 ug/h via INTRAVENOUS
  Administered 2014-11-01: 100 ug/h via INTRAVENOUS
  Filled 2014-10-30 (×5): qty 50

## 2014-10-30 MED ORDER — ASPIRIN 81 MG PO CHEW
81.0000 mg | CHEWABLE_TABLET | Freq: Every day | ORAL | Status: DC
  Administered 2014-10-30 – 2014-11-08 (×10): 81 mg via ORAL
  Filled 2014-10-30 (×8): qty 1

## 2014-10-30 MED ORDER — SODIUM CHLORIDE 0.9 % IV SOLN
3.0000 ug/kg/min | INTRAVENOUS | Status: DC
  Administered 2014-10-30: 3 ug/kg/min via INTRAVENOUS
  Administered 2014-10-31: 4 ug/kg/min via INTRAVENOUS
  Administered 2014-10-31: 3 ug/kg/min via INTRAVENOUS
  Filled 2014-10-30 (×4): qty 20

## 2014-10-30 MED ORDER — PROPOFOL 1000 MG/100ML IV EMUL
5.0000 ug/kg/min | INTRAVENOUS | Status: DC
  Administered 2014-10-30 (×2): 50 ug/kg/min via INTRAVENOUS
  Administered 2014-10-30: 30 ug/kg/min via INTRAVENOUS
  Administered 2014-10-30: 50 ug/kg/min via INTRAVENOUS
  Administered 2014-10-31 (×2): 40 ug/kg/min via INTRAVENOUS
  Administered 2014-10-31: 34.933 ug/kg/min via INTRAVENOUS
  Administered 2014-11-01 (×3): 40 ug/kg/min via INTRAVENOUS
  Administered 2014-11-01: 30 ug/kg/min via INTRAVENOUS
  Administered 2014-11-02: 20 ug/kg/min via INTRAVENOUS
  Filled 2014-10-30 (×13): qty 100

## 2014-10-30 MED ORDER — DEXTROSE 5 % IV SOLN
2.0000 g | Freq: Two times a day (BID) | INTRAVENOUS | Status: DC
  Administered 2014-10-30 – 2014-11-01 (×5): 2 g via INTRAVENOUS
  Filled 2014-10-30 (×6): qty 2

## 2014-10-30 NOTE — Progress Notes (Signed)
  Echocardiogram 2D Echocardiogram has been performed.  Holly Figueroa 10/30/2014, 12:36 PM

## 2014-10-30 NOTE — Progress Notes (Signed)
eLink Physician-Brief Progress Note Patient Name: Holly Figueroa DOB: 20-Mar-1966 MRN: [id number]   Date of Service  10/30/2014  HPI/Events of Note  Patient on vent with sats in the low 80s tracking with HR.  Breathing 33 with set rate on vent of 20.  Currently being treated for pulm edema with IV lasix.  On propofol sedation.  eICU Interventions  Plan: Continue with lasix ARDS Protocol Increase sedation to lower RR on vent and tolerate increase in peep     Intervention Category Major Interventions: Respiratory failure - evaluation and management  Holly Figueroa 10/30/2014, 5:12 AM

## 2014-10-30 NOTE — Procedures (Signed)
Arterial Catheter Insertion Procedure Note Holly Figueroa [id number] [birth year]  Procedure: Insertion of Arterial Catheter  Indications: Blood pressure monitoring and Frequent blood sampling  Procedure Details Consent: Risks of procedure as well as the alternatives and risks of each were explained to the (patient/caregiver).  Consent for procedure obtained. Time Out: Verified patient identification, verified procedure, site/side was marked, verified correct patient position, special equipment/implants available, medications/allergies/relevent history reviewed, required imaging and test results available.  Performed  Maximum sterile technique was used including antiseptics, cap, gloves, gown, hand hygiene, mask and sheet. Skin prep: Chlorhexidine; local anesthetic administered 20 gauge catheter was inserted into right radial artery using the Seldinger technique.  Evaluation Blood flow good; BP tracing good. Complications: No apparent complications.   Holly Figueroa 10/30/2014

## 2014-10-30 NOTE — Consult Note (Signed)
[facility]  Department of Emergency Medicine   Code Blue CONSULT NOTE  Chief Complaint: respiratory distress and respiratory failure/need for intubation Level V Caveat: severity of illness History of present illness: I was contacted by the hospital for need for intubation in ICU for increasing respiratory distress and respiratory failure. Admitted yesterday for PNA, now worsening on bipap, with reports of patient tiring out and breathing 42 times a minute. Pt reports she feels SOB     ROS: Unable to obtain, Level V caveat  Scheduled Meds: . antiseptic oral rinse  7 mL Mouth Rinse q12n4p  . aspirin EC  81 mg Oral Daily  . atorvastatin  20 mg Oral Daily  . azithromycin  500 mg Intravenous Q24H  . cefTRIAXone (ROCEPHIN)  IV  1 g Intravenous Q24H  . chlorhexidine  15 mL Mouth Rinse BID  . fentaNYL      . fentaNYL (SUBLIMAZE) injection  100 mcg Intravenous Once  . FLUoxetine  40 mg Oral QAC breakfast  . furosemide  40 mg Intravenous Once  . guaiFENesin  1,200 mg Oral BID  . ipratropium-albuterol  3 mL Nebulization Q4H  . midazolam      . pantoprazole  40 mg Oral Daily  . propofol      . traZODone  50-100 mg Oral QHS  . verapamil  120 mg Oral Daily   Continuous Infusions: . sodium chloride 100 mL/hr at 10/29/14 1906  . propofol (DIPRIVAN) infusion     PRN Meds:.acetaminophen, docusate, fentaNYL (SUBLIMAZE) injection, fentaNYL (SUBLIMAZE) injection, morphine injection Past Medical History  Diagnosis Date  . Acute respiratory failure   . Hypokalemia   . CHF (congestive heart failure)   . Mitral stenosis   . Leukocytosis, unspecified   . Tobacco abuse   . Benzodiazepine dependence   . Legionella pneumonia   . Hypertension   . Cervical cancer   . Depression   . Anxiety   . Panic attacks   . Headache(784.0)     migraines  . Anemia 08-2008    Blood transfusion   Past Surgical History  Procedure Laterality Date  . Laser ablation of the cervix    . Mole  removal    . Nasal sinus surgery    . Cervical conization w/bx  2000  . 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 Cholecystectomy  06/26/2012    Procedure: LAPAROSCOPIC CHOLECYSTECTOMY WITH INTRAOPERATIVE CHOLANGIOGRAM;  Surgeon: Ralene Ok, MD;  Location: [facility];  Service: General;  Laterality: N/A;   History   Social History  . Marital Status: Married    Spouse Name: N/A  . Number of Children: N/A  . Years of Education: N/A   Occupational History  . Not on file.   Social History Main Topics  . Smoking status: Current Every Day Smoker -- 1.00 packs/day for 23 years    Types: Cigarettes    Last Attempt to Quit: 09/07/2010  . Smokeless tobacco: Never Used  . Alcohol Use: No  . Drug Use: No  . Sexual Activity: Yes   Other Topics Concern  . Not on file   Social History Narrative   Allergies  Allergen Reactions  . Iodinated Diagnostic Agents Hives  . Sulfa Antibiotics Hives  . Dilaudid [Hydromorphone Hcl] Itching and Palpitations  . Hydrocodone Palpitations    Last set of Vital Signs (not current) Filed Vitals:   10/30/14 0300  BP: 88/58  Pulse: 97  Temp: 99.7 F (37.6  C)  Resp: 8      Physical Exam Gen: awake and alert. In respiratory distress. Speaks in short phrases only.  Cardiovascular: tachycardiac Resp: apneic. Rales throughout, left greater than right   Abd: nondistended  Neuro: Alert, following commands  HEENT: on bipap, thin pulmonary fluid coming from mouth when she coughs Neck: No crepitus  Musculoskeletal: No deformity  Skin: warm  Procedures  INTUBATION Performed by: Hoy Morn Required items: required blood products, implants, devices, and special equipment available Patient identity confirmed: provided demographic data and hospital-assigned identification number Time out: Immediately prior to procedure a "time out" was called to verify the correct patient, procedure, equipment, support staff and site/side marked  as required. Indications: respiratory failure Intubation method: glidescope Preoxygenation: BIPAP Sedatives: etomidate Paralytic: succinylcholine Tube Size:7.5 cuffed Post-procedure assessment: chest rise and ETCO2 monitor Breath sounds: equal and absent over the epigastrium Tube secured by Respiratory Therapy Patient tolerated the procedure well with no immediate complications.  CRITICAL CARE Performed by: Hoy Morn Total critical care time: 40 Critical care time was exclusive of separately billable procedures and treating other patients. Critical care was necessary to treat or prevent imminent or life-threatening deterioration. Critical care was time spent personally by me on the following activities: development of treatment plan with patient and/or surrogate as well as nursing, discussions with consultants, evaluation of patient's response to treatment, examination of patient, obtaining history from patient or surrogate, ordering and performing treatments and interventions, ordering and review of laboratory studies, ordering and review of radiographic studies, pulse oximetry and re-evaluation of patient's condition.     Medical Decision making  Worsening PNA now with pulmonary edema and respiratory failure  Assessment and Plan  Intubated for respiratory failure without difficulty. Profoundly hypoxic to 55% initially after intubation. Required recruitment measures, maximal vent settings and PEEP of 12. HOB raised with improvement in hypoxia. Currently at 4:54 AM pts 02 sats are 91% on 100% FiO2. Sedated with propofol, fentanyl and versed.   All additional medical care from this time forward per Dr Darrick Meigs and [facility] team (4:55 AM)

## 2014-10-30 NOTE — Progress Notes (Signed)
PULMONARY / CRITICAL CARE MEDICINE HISTORY AND PHYSICAL EXAMINATION   Name: Holly Figueroa MRN: [id number] DOB: 04-22-1966    ADMISSION DATE:  10/28/2014  PRIMARY SERVICE: PCCM  CHIEF COMPLAINT:  Fever, chills, SOB  BRIEF PATIENT DESCRIPTION:  49 yo female from [facility] with PNA, ARDS, septic shock.  SIGNIFICANT EVENTS: 5/05 Admit to [facility] 5/07 To [place], start pressors, nimbex  STUDIES: 5/07 CT chest >>  SUBJECTIVE:   VITAL SIGNS: Temp:  [97.8 F (36.6 C)-103 F (39.4 C)] 103 F (39.4 C) (05/07 0730) Pulse Rate:  [89-125] 114 (05/07 0600) Resp:  [8-52] 31 (05/07 0600) BP: (87-151)/(42-97) 119/59 mmHg (05/07 0600) SpO2:  [79 %-99 %] 88 % (05/07 0600) FiO2 (%):  [45 %-100 %] 100 % (05/07 0724) HEMODYNAMICS:   VENTILATOR SETTINGS: Vent Mode:  [-] PRVC FiO2 (%):  [45 %-100 %] 100 % Set Rate:  [8 bmp-32 bmp] 30 bmp Vt Set:  [480 mL-530 mL] 480 mL PEEP:  [12 cmH20] 12 cmH20 Plateau Pressure:  [30 cmH20-33 cmH20] 30 cmH20 INTAKE / OUTPUT: Intake/Output      05/06 0701 - 05/07 0700 05/07 0701 - 05/08 0700   I.V. (mL/kg) 810 (10.2)    Other 450    Total Intake(mL/kg) 1260 (15.9)    Urine (mL/kg/hr) 2300 (1.2)    Total Output 2300     Net -1040          Urine Occurrence 1 x      PHYSICAL EXAMINATION: General: ill appearing Neuro: RASS -3 HEENT: ETT in place Cardiovascular:  Regular, tachycardic Lungs: decreased BS on Lt > Rt with b/l crackles Abdomen:  Soft, non tender Musculoskeletal: no edema Skin: no rashes  LABS:  CBC  Recent Labs Lab 10/28/14 2310 10/29/14 0439  WBC 11.9* 18.3*  HGB 11.3* 9.9*  HCT 33.7* 29.4*  PLT 269 262   BMET  Recent Labs Lab 10/28/14 2310 10/29/14 0439  NA 137 140  K 3.3* 4.0  CL 107 113*  CO2 21* 21*  BUN 15 13  CREATININE 1.22* 1.06*  GLUCOSE 202* 161*   Electrolytes  Recent Labs Lab 10/28/14 2310 10/29/14 0439  CALCIUM 8.7* 8.0*   Sepsis Markers  Recent Labs Lab 10/28/14 2315  LATICACIDVEN 1.72    ABG  Recent Labs Lab 10/29/14 0102 10/29/14 1213 10/30/14 0510  PHART 7.386 7.332* 7.259*  PCO2ART 33.7* 39.1 40.8  PO2ART 65.3* 74.1* 56.0*   Liver Enzymes  Recent Labs Lab 10/28/14 2310 10/29/14 0439  AST 30 23  ALT 36 28  ALKPHOS 113 75  BILITOT 0.5 0.3  ALBUMIN 3.6 2.8*   Cardiac Enzymes No results for input(s): TROPONINI, PROBNP in the last 168 hours.   Glucose No results for input(s): GLUCAP in the last 168 hours.  Imaging Dg Chest Port 1 View  10/30/2014   CLINICAL DATA:  Fever and hypoxia  EXAM: PORTABLE CHEST - 1 VIEW  COMPARISON:  10/28/2014  FINDINGS: The patient has been intubated. ET tube tip is 2.9 cm above the carina. There is worsened diffuse airspace opacity bilaterally, now involving the entire left lung and nearly all of the right lung with relative sparing of the right base. This could represent ARDS, infection, edema. No large effusion is evident. No pneumothorax is evident.  IMPRESSION: Satisfactory ET tube position. Worsened diffuse airspace opacities bilaterally.   Electronically Signed   By: Andreas Newport M.D.   On: 10/30/2014 05:40   Dg Chest Port 1 View  10/29/2014   CLINICAL DATA:  Chest pain, onset tonight. Dyspnea. Fever for 2 days.  EXAM: PORTABLE CHEST - 1 VIEW  COMPARISON:  06/19/2012  FINDINGS: Diffuse airspace opacities in the left lung, and more patchy airspace opacity in the right medial base, likely represent infectious infiltrate. No large effusion is evident. Heart size unremarkable and unchanged.  IMPRESSION: Multifocal airspace opacities, suspicious for infectious infiltrate in this clinical setting. Followup PA and lateral chest X-ray is recommended in 3-4 weeks following trial of antibiotic therapy to ensure resolution and exclude underlying malignancy.   Electronically Signed   By: Andreas Newport M.D.   On: 10/29/2014 00:28    ASSESSMENT / PLAN: PULMONARY A: Acute hypoxic respiratory failure 2nd to CAP and ARDS. Tobacco  abuse. P:   ARDS protocol Add nimbex 5/07 F/u CXR, ABG PRN BDs Check CT chest to assess whether she has significant pleural effusion  CARDIOVASCULAR A: Septic shock. Hx of mitral stenosis. P:   Pressors for MAP > 65 Monitor CVP Continue ASA Hold outpt lasix, verapamil F/u TTE >> might need TEE to further assess for endocarditis  RENAL A: AKI. P:   Monitor renal fx, urine outpt  GASTROINTESTINAL A: Nutrition. P:   NPO Protonix for SUP  HEMATOLOGIC A: Anemia of critical illness. P:   F/u CBC SQ heparin, SCDs  INFECTIOUS A: Septic shock 2nd to CAP with GNR bacteremia. P:   Day 3 of Abx, change to vancomycin, cefepime, levaquin pending cx ID Check procalcitonin  Blood 5/05 >> GNR >> Urine 5/05 >> Legionella Ag 5/06 >>  ENDOCRINE A: No active issues. P:   Monitor blood sugar on BMET  NEUROLOGIC A: Acute encephalopathy 2nd to sepsis, respiratory failure. Hx of depression, anxiety on chronic benzo's. P:   RASS goal -5 while on nimbex Hold outpt xanax, prozac, trazodone, roxicet  Updated pts husband at bedside.  CC time 50 minutes.  Chesley Mires, MD [facility] Pulmonary/Critical Care 10/30/2014, 8:40 AM Pager:  [phone] After 3pm call: [phone]

## 2014-10-30 NOTE — Procedures (Signed)
Central Venous Catheter Insertion Procedure Note Eular Panek Contee [id number] 1965-11-23  Procedure: Insertion of Central Venous Catheter Indications: Assessment of intravascular volume, Drug and/or fluid administration and Frequent blood sampling  Procedure Details Consent: Risks of procedure as well as the alternatives and risks of each were explained to the (patient/caregiver).  Consent for procedure obtained. Time Out: Verified patient identification, verified procedure, site/side was marked, verified correct patient position, special equipment/implants available, medications/allergies/relevent history reviewed, required imaging and test results available.  Performed  Maximum sterile technique was used including antiseptics, cap, gloves, gown, hand hygiene, mask and sheet. Skin prep: Chlorhexidine; local anesthetic administered A antimicrobial bonded/coated triple lumen catheter was placed in the left internal jugular vein using the Seldinger technique. Ultrasound guidance used.Yes.   Catheter placed to 20 cm. Blood aspirated via all 3 ports and then flushed x 3. Line sutured x 2 and dressing applied.  Evaluation Blood flow good Complications: No apparent complications Patient did tolerate procedure well. Chest X-ray ordered to verify placement.  CXR: pending.  Richardson Landry Minor ACNP Maryanna Shape PCCM Pager [phone] till 3 pm If no answer page [phone] 10/30/2014, 8:57 AM

## 2014-10-30 NOTE — Progress Notes (Signed)
ANTIBIOTIC CONSULT NOTE - INITIAL  Pharmacy Consult for Vancomycin, Cefepime, Levofloxacin Indication: pneumonia  Allergies  Allergen Reactions  . Iodinated Diagnostic Agents Hives  . Sulfa Antibiotics Hives  . Dilaudid [Hydromorphone Hcl] Itching and Palpitations  . Hydrocodone Palpitations    Patient Measurements: Height: _0  (167.6 cm) Weight: 174 lb 9.7 oz (79.2 kg) IBW/kg (Calculated) : 59.3  Vital Signs: Temp: 103 F (39.4 C) (05/07 0730) Temp Source: Oral (05/07 0730) BP: 119/59 mmHg (05/07 0600) Pulse Rate: 114 (05/07 0600) Intake/Output from previous day: 05/06 0701 - 05/07 0700 In: 1260 [I.V.:810] Out: 2300 [Urine:2300] Intake/Output from this shift:    Labs:  Recent Labs  10/28/14 2310 10/29/14 0439  WBC 11.9* 18.3*  HGB 11.3* 9.9*  PLT 269 262  CREATININE 1.22* 1.06*   Estimated Creatinine Clearance: 69.7 mL/min (by C-G formula based on Cr of 1.06). No results for input(s): VANCOTROUGH, VANCOPEAK, VANCORANDOM, GENTTROUGH, GENTPEAK, GENTRANDOM, TOBRATROUGH, TOBRAPEAK, TOBRARND, AMIKACINPEAK, AMIKACINTROU, AMIKACIN in the last 72 hours.   Microbiology: Recent Results (from the past 720 hour(s))  Blood Culture (routine x 2)     Status: None (Preliminary result)   Collection Time: 10/28/14 10:50 PM  Result Value Ref Range Status   Specimen Description BLOOD RIGHT FOREARM  Final   Special Requests BOTTLES DRAWN AEROBIC AND ANAEROBIC 12CC EACH  Final   Culture   Final    GRAM NEGATIVE RODS Gram Stain Report Called to,Read Back By and Verified With: MCDANIEL, M AT 1436 ON [id number] BY WOODS, M    Report Status PENDING  Incomplete  Blood Culture (routine x 2)     Status: None (Preliminary result)   Collection Time: 10/28/14 11:10 PM  Result Value Ref Range Status   Specimen Description BLOOD LEFT FOREARM  Final   Special Requests BOTTLES DRAWN AEROBIC AND ANAEROBIC 11CC EACH  Final   Culture  Setup Time   Final    GRAM NEGATIVE RODS Gram Stain  Report Called to,Read Back By and Verified With: MCDANIEL, M AT 1436 ON [id number] BY WOODS, M    Culture NO GROWTH 1 DAY  Final   Report Status PENDING  Incomplete  MRSA PCR Screening     Status: None   Collection Time: 10/29/14  2:30 AM  Result Value Ref Range Status   MRSA by PCR NEGATIVE NEGATIVE Final    Comment:        The GeneXpert MRSA Assay (FDA approved for NASAL specimens only), is one component of a comprehensive MRSA colonization surveillance program. It is not intended to diagnose MRSA infection nor to guide or monitor treatment for MRSA infections.     Medical History: Past Medical History  Diagnosis Date  . Acute respiratory failure   . Hypokalemia   . CHF (congestive heart failure)   . Mitral stenosis   . Leukocytosis, unspecified   . Tobacco abuse   . Benzodiazepine dependence   . Legionella pneumonia   . Hypertension   . Cervical cancer   . Depression   . Anxiety   . Panic attacks   . Headache(784.0)     migraines  . Anemia 08-2008    Blood transfusion    Medications:  Scheduled:  . antiseptic oral rinse  7 mL Mouth Rinse QID  . artificial tears  1 application Both Eyes 3 times per day  . aspirin EC  81 mg Oral Daily  . atorvastatin  20 mg Per Tube q1800  . ceFEPime (MAXIPIME) IV  2 g Intravenous  Q12H  . chlorhexidine  15 mL Mouth Rinse BID  . cisatracurium  0.05 mg/kg Intravenous Once  . fentaNYL (SUBLIMAZE) injection  100 mcg Intravenous Once  . heparin  5,000 Units Subcutaneous 3 times per day  . pantoprazole sodium  40 mg Per Tube Q24H  . rocuronium       Infusions:  . sodium chloride 10 mL/hr at 10/30/14 0500  . cisatracurium (NIMBEX) infusion    . fentaNYL infusion INTRAVENOUS    . norepinephrine (LEVOPHED) Adult infusion    . propofol (DIPRIVAN) infusion     Assessment: CC: Holly Figueroa is an 49 y.o. female admitted on 10/28/2014 presenting with fever, chills, and SOB.  Cultures have grown gram negative rods.  CXR revealed  worsening airspace opacities bilaterally.  Pharmacy has been consulted to dose vancomycin, cefepime, and levaquin in the setting of CAP.    Infectious Disease: Sepsis 2/2 to CAP / UTI - WBC 11.9 > 18.3, Tmax 103, LA 1.72  5/7 Vanc >>  5/7 Cefepime >> 5/7 Levo >>  5/5 Bld x 2 >> GNR 5/5 UC >>   Nephrology: Scr 1.22 > 1.06, Crcl ~65-70 mL/min.    Plan:  - Continue Cefepime 2 g IV Q12H (entered by MD) - Vancomycin 1500 mg IV loading dose, followed by 1000 mg IV Q12H - Levaquin 750 mg IV daily - Monitor for clinical efficacy and trough levels when appropriate - Follow up cultures and renal function  Hassie Bruce, Pharm. D. Clinical Pharmacy Resident Pager: [phone] Ph: [phone] 10/30/2014 8:50 AM

## 2014-10-30 NOTE — Progress Notes (Addendum)
INITIAL NUTRITION ASSESSMENT  DOCUMENTATION CODES Per approved criteria  -Not Applicable   INTERVENTION: If unable to extubate within 24 hrs, recommend initiating TF via OGT with Vital HP at 25 ml/h and Prostat 30 ml BID on day 1; on day 2, increase to goal rate of 60 ml/h (1440 ml per day) to provide 1440 kcals (626 from prop) , 126 gm protein, 1204 ml free water daily.   NUTRITION DIAGNOSIS: Inadequate oral intake related to inability to eat as evidenced by NPO status  Goal: Pt to meet >/= 90% of their estimated nutrition needs   Monitor:  Vent status, prop rate, pts tmax, initiation of tf vs extubation, fluid status, labs  Reason for Assessment: New vent  49 y.o. female  Admitting Dx: <principal problem not specified>  ASSESSMENT: 49 y/o woman with hx of mitral stenosis p/w pneumonia, GNR sepsis, developed into ARDS  Family reports pt got sick extremely abruptly. Prior to she was her normal self-Eating fine, no hx of wt loss. Family reports normal weight is 160  Nutrition Focused Physical Exam: WDL    Patient is currently intubated on ventilator support MV: 14.3 L/min Temp (24hrs), Avg:101.4 F (38.6 C), Min:99.7 F (37.6 C), Max:103 F (39.4 C)  Propofol: 23.7 ml/hr - 626 kcals/day  Height: Ht Readings from Last 1 Encounters:  10/30/14 _0  (1.676 m)    Weight: Wt Readings from Last 1 Encounters:  10/30/14 171 lb 11.8 oz (77.9 kg)    Ideal Body Weight: 130 lbs  % Ideal Body Weight: 123%  Wt Readings from Last 10 Encounters:  10/30/14 171 lb 11.8 oz (77.9 kg)  07/10/12 183 lb 9.6 oz (83.28 kg)  06/04/12 177 lb 3.2 oz (80.377 kg)  10/11/10 181 lb 12.8 oz (82.464 kg)  admit weight: 160 lbs - 72.7 kg  Usual Body Weight: 160  % Usual Body Weight: 100%  BMI:  Body mass index is 27.73 kg/(m^2).  Estimated Nutritional Needs: Kcal: 2137 kcals Protein: 109-124 g pro (1.5-1.7 g/kg)  Fluid: per md  Skin: WDL  Diet Order: Diet NPO time  specified  EDUCATION NEEDS: -No education needs identified at this time   Intake/Output Summary (Last 24 hours) at 10/30/14 1341 Last data filed at 10/30/14 1100  Gross per 24 hour  Intake 2211.62 ml  Output   2300 ml  Net -88.38 ml    Last BM: 5/5  Labs:   Recent Labs Lab 10/28/14 2310 10/29/14 0439  NA 137 140  K 3.3* 4.0  CL 107 113*  CO2 21* 21*  BUN 15 13  CREATININE 1.22* 1.06*  CALCIUM 8.7* 8.0*  GLUCOSE 202* 161*    CBG (last 3)   Recent Labs  10/30/14 0722  GLUCAP 161*    Scheduled Meds: . antiseptic oral rinse  7 mL Mouth Rinse QID  . artificial tears  1 application Both Eyes 3 times per day  . aspirin  81 mg Oral Daily  . atorvastatin  20 mg Per Tube q1800  . ceFEPime (MAXIPIME) IV  2 g Intravenous Q12H  . chlorhexidine  15 mL Mouth Rinse BID  . fentaNYL (SUBLIMAZE) injection  100 mcg Intravenous Once  . heparin  5,000 Units Subcutaneous 3 times per day  . levofloxacin (LEVAQUIN) IV  750 mg Intravenous Q24H  . pantoprazole sodium  40 mg Per Tube Q24H  . rocuronium      . vancomycin  1,000 mg Intravenous Q12H    Continuous Infusions: . sodium chloride 10 mL/hr  at 10/30/14 0500  . cisatracurium (NIMBEX) infusion 3 mcg/kg/min (10/30/14 1337)  . fentaNYL infusion INTRAVENOUS 200 mcg/hr (10/30/14 1000)  . midazolam (VERSED) infusion 2 mg/hr (10/30/14 1125)  . norepinephrine (LEVOPHED) Adult infusion 8 mcg/min (10/30/14 1106)  . propofol (DIPRIVAN) infusion 50 mcg/kg/min (10/30/14 1107)    Past Medical History  Diagnosis Date  . Acute respiratory failure   . Hypokalemia   . CHF (congestive heart failure)   . Mitral stenosis   . Leukocytosis, unspecified   . Tobacco abuse   . Benzodiazepine dependence   . Legionella pneumonia   . Hypertension   . Cervical cancer   . Depression   . Anxiety   . Panic attacks   . Headache(784.0)     migraines  . Anemia 08-2008    Blood transfusion    Past Surgical History  Procedure Laterality  Date  . Laser ablation of the cervix    . Mole removal    . Nasal sinus surgery    . Cervical conization w/bx  2000  . 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 Cholecystectomy  06/26/2012    Procedure: LAPAROSCOPIC CHOLECYSTECTOMY WITH INTRAOPERATIVE CHOLANGIOGRAM;  Surgeon: Ralene Ok, MD;  Location: [facility];  Service: General;  Laterality: N/A;    Burtis Junes RD, LDN Nutrition Pager: [phone] 10/30/2014 1:41 PM

## 2014-10-30 NOTE — Progress Notes (Signed)
Patient was transferred to [facility], rm [id number] at 6.40 am  per MD order. Patient's husband was at the bedside. Report was given to [place] from [place] [facility] [place], RN from 38M. Patient's vital signs were currently stable.

## 2014-10-30 NOTE — Progress Notes (Signed)
E link  Dr Deterding evaluated the patient and recommend to transfer to [facility] ICU.

## 2014-10-30 NOTE — Progress Notes (Signed)
Patient woke up around 3.40 am with increased HR in 120s, O2 in mid. 80s and RR in 4s. Respiratory therapist was called to reassess the patient and draw ABG ealier. However, patient stated that she felt SOB. MD was immediately notified. Dr. Darrick Meigs arrived several minutes later and decided to proceed with intubation. Patient was intubated at 4.15 am by ED MD. Patient received 80 mg of Lasix and started on propofol drip. Patient's husband was called and updated about the current situation and he is on his way. Dr. Jimmy Footman  was called and notified about patient's ABG results. New orders are received and followed.

## 2014-10-30 NOTE — Progress Notes (Addendum)
Called by nurse to evaluate the patient for worsening respiratory distress. Patient was admitted on 10/29/2014 with bilateral pneumonia and started on IV antibiotics. Patient was started on BiPAP for respiratory distress, and this morning patient was tired and tachypnea and breathing 40s per minute. ED physician was called for intubation, and patient is currently intubated and on mechanical ventilation. Sedation ordered with propofol, Versed and fentanyl. Repeat chest x-ray showed worsening infiltrates, final report is pending.   On exam Chest- bilateral rhonchi  Assessment Multifocal pneumonia Pulmonary edema  Continue IV antibiotics with Rocephin and Zithromax Will give 1 dose of Lasix 80 mg IV 1 change the fluids to [facility] and discussed with E link Dr Jimmy Footman.  Total time spent 50 minutes

## 2014-10-30 NOTE — H&P (Signed)
PULMONARY / CRITICAL CARE MEDICINE HISTORY AND PHYSICAL EXAMINATION   Name: Holly Figueroa MRN: [id number] DOB: [birth year]    ADMISSION DATE:  10/28/2014  PRIMARY SERVICE: PCCM  CHIEF COMPLAINT:  Fever, chills, SOB  BRIEF PATIENT DESCRIPTION: 49 y/o woman with hx of mitral stenosis p/w pneumonia, GNR sepsis, developed into ARDS  SIGNIFICANT EVENTS / STUDIES:  Patient intubated 5/7 for increased WOB, tachypnea.  LINES / TUBES: OETT 7.5 - 5/7 Deneise Lever [place]) PIV x2 Foley (5/6, Forestine Na)  CULTURES: Blood, 5/5 - GNR (4/4)  ANTIBIOTICS: Ceftriaxone 5/5-  HISTORY OF PRESENT ILLNESS:   49 y/o woman, hx of rheumatic heart dz with prior history of intubation for pneumonia who developed chest discomfort, sudden shaking chills, fever. Presented to [facility] ED on night of 5/5, found to be hypoxic. Admitted to ICU, placed on BiPAP. Increased WOB and worsening hypoxemia warranted intubation on early morning of 5/7 and patient was transfered to [facility].  PAST MEDICAL HISTORY :  Past Medical History  Diagnosis Date  . Acute respiratory failure   . Hypokalemia   . CHF (congestive heart failure)   . Mitral stenosis   . Leukocytosis, unspecified   . Tobacco abuse   . Benzodiazepine dependence   . Legionella pneumonia   . Hypertension   . Cervical cancer   . Depression   . Anxiety   . Panic attacks   . Headache(784.0)     migraines  . Anemia 08-2008    Blood transfusion   Past Surgical History  Procedure Laterality Date  . Laser ablation of the cervix    . Mole removal    . Nasal sinus surgery    . Cervical conization w/bx  2000  . 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 Cholecystectomy  06/26/2012    Procedure: LAPAROSCOPIC CHOLECYSTECTOMY WITH INTRAOPERATIVE CHOLANGIOGRAM;  Surgeon: Ralene Ok, MD;  Location: [facility] ORS;  Service: General;  Laterality: N/A;   Prior to Admission medications   Medication Sig Start Date End Date Taking? Authorizing  Provider  acetaminophen (TYLENOL) 500 MG tablet Take 1,000 mg by mouth every 6 (six) hours as needed for mild pain or moderate pain.   Yes Historical Provider, MD  ALPRAZolam Duanne Moron) 1 MG tablet Take 1 mg by mouth 5 (five) times daily. anxiety   Yes Historical Provider, MD  aspirin EC 81 MG tablet Take 81 mg by mouth daily.   Yes Historical Provider, MD  atorvastatin (LIPITOR) 20 MG tablet Take 20 mg by mouth daily.   Yes Historical Provider, MD  FLUoxetine (PROZAC) 40 MG capsule Take 40-80 mg by mouth daily before breakfast.    Yes Historical Provider, MD  furosemide (LASIX) 20 MG tablet Take 20 mg by mouth daily as needed for fluid. Takes 2 tablets   Yes Historical Provider, MD  GARCINIA CAMBOGIA-CHROMIUM PO Take 2 capsules by mouth daily.   Yes Historical Provider, MD  ibuprofen (ADVIL,MOTRIN) 200 MG tablet Take 600 mg by mouth every 6 (six) hours as needed for fever, headache or mild pain.   Yes Historical Provider, MD  omeprazole (PRILOSEC) 20 MG capsule Take 20 mg by mouth daily.     Yes Historical Provider, MD  psyllium (REGULOID) 0.52 G capsule Take 5 capsules by mouth daily.   Yes Historical Provider, MD  traZODone (DESYREL) 50 MG tablet Take 50-100 mg by mouth at bedtime.   Yes Historical Provider, MD  verapamil (VERELAN PM) 120 MG 24 hr capsule Take  120 mg by mouth daily.   Yes Historical Provider, MD  oxyCODONE-acetaminophen (ROXICET) 5-325 MG per tablet Take 1 tablet by mouth every 4 (four) hours as needed for pain. Patient not taking: Reported on 10/28/2014 06/26/12   Ralene Ok, MD   Allergies  Allergen Reactions  . Iodinated Diagnostic Agents Hives  . Sulfa Antibiotics Hives  . Dilaudid [Hydromorphone Hcl] Itching and Palpitations  . Hydrocodone Palpitations    FAMILY HISTORY:  Family History  Problem Relation Age of Onset  . Heart disease Father   . COPD Father   . Heart failure Maternal Grandmother   . Stroke Mother    SOCIAL HISTORY:  reports that she has been  smoking Cigarettes.  She has a 23 pack-year smoking history. She has never used smokeless tobacco. She reports that she does not drink alcohol or use illicit drugs.  REVIEW OF SYSTEMS:  Cannot obtain 2/2 intubation.  SUBJECTIVE:   VITAL SIGNS: Temp:  [97.8 F (36.6 C)-103 F (39.4 C)] 99.7 F (37.6 C) (05/07 0400) Pulse Rate:  [80-125] 114 (05/07 0600) Resp:  [8-52] 31 (05/07 0600) BP: (87-151)/(42-97) 119/59 mmHg (05/07 0600) SpO2:  [79 %-99 %] 88 % (05/07 0600) FiO2 (%):  [45 %-100 %] 100 % (05/07 0600) HEMODYNAMICS:   VENTILATOR SETTINGS: Vent Mode:  [-] PRVC FiO2 (%):  [45 %-100 %] 100 % Set Rate:  [8 bmp-32 bmp] 30 bmp Vt Set:  [480 mL-530 mL] 480 mL PEEP:  [12 cmH20] 12 cmH20 Plateau Pressure:  [33 cmH20] 33 cmH20 INTAKE / OUTPUT: Intake/Output      05/06 0701 - 05/07 0700   I.V. (mL/kg) 810 (10.2)   Other 450   Total Intake(mL/kg) 1260 (15.9)   Urine (mL/kg/hr) 1950 (1)   Total Output 1950   Net -690       Urine Occurrence 1 x     PHYSICAL EXAMINATION: General:  Intubated woman, sedated Neuro:  Sedated, CN reflexes intact HEENT:  MMM Neck: No JVD Cardiovascular:  Tachycardic rate, could not appreciate murmur, but exam limited by noisy room, ventilator. Lungs:  CTAB Abdomen:  Soft Musculoskeletal:  No deformities. Skin:  No rash.  LABS:  CBC  Recent Labs Lab 10/28/14 2310 10/29/14 0439  WBC 11.9* 18.3*  HGB 11.3* 9.9*  HCT 33.7* 29.4*  PLT 269 262   Coag's No results for input(s): APTT, INR in the last 168 hours. BMET  Recent Labs Lab 10/28/14 2310 10/29/14 0439  NA 137 140  K 3.3* 4.0  CL 107 113*  CO2 21* 21*  BUN 15 13  CREATININE 1.22* 1.06*  GLUCOSE 202* 161*   Electrolytes  Recent Labs Lab 10/28/14 2310 10/29/14 0439  CALCIUM 8.7* 8.0*   Sepsis Markers  Recent Labs Lab 10/28/14 2315  LATICACIDVEN 1.72   ABG  Recent Labs Lab 10/29/14 0102 10/29/14 1213 10/30/14 0510  PHART 7.386 7.332* 7.259*  PCO2ART  33.7* 39.1 40.8  PO2ART 65.3* 74.1* 56.0*   Liver Enzymes  Recent Labs Lab 10/28/14 2310 10/29/14 0439  AST 30 23  ALT 36 28  ALKPHOS 113 75  BILITOT 0.5 0.3  ALBUMIN 3.6 2.8*   Cardiac Enzymes No results for input(s): TROPONINI, PROBNP in the last 168 hours. Glucose No results for input(s): GLUCAP in the last 168 hours.  Imaging Dg Chest Port 1 View  10/30/2014   CLINICAL DATA:  Fever and hypoxia  EXAM: PORTABLE CHEST - 1 VIEW  COMPARISON:  10/28/2014  FINDINGS: The patient has been intubated. ET  tube tip is 2.9 cm above the carina. There is worsened diffuse airspace opacity bilaterally, now involving the entire left lung and nearly all of the right lung with relative sparing of the right base. This could represent ARDS, infection, edema. No large effusion is evident. No pneumothorax is evident.  IMPRESSION: Satisfactory ET tube position. Worsened diffuse airspace opacities bilaterally.   Electronically Signed   By: Andreas Newport M.D.   On: 10/30/2014 05:40   Dg Chest Port 1 View  10/29/2014   CLINICAL DATA:  Chest pain, onset tonight. Dyspnea. Fever for 2 days.  EXAM: PORTABLE CHEST - 1 VIEW  COMPARISON:  06/19/2012  FINDINGS: Diffuse airspace opacities in the left lung, and more patchy airspace opacity in the right medial base, likely represent infectious infiltrate. No large effusion is evident. Heart size unremarkable and unchanged.  IMPRESSION: Multifocal airspace opacities, suspicious for infectious infiltrate in this clinical setting. Followup PA and lateral chest X-ray is recommended in 3-4 weeks following trial of antibiotic therapy to ensure resolution and exclude underlying malignancy.   Electronically Signed   By: Andreas Newport M.D.   On: 10/29/2014 00:28    EKG: Sinus tach CXR: pending, AP CXR showed significant opacities, probable L pleural effusion.  ASSESSMENT / PLAN:  Active Problems:   Tobacco abuse   Bilateral pneumonia   CAP (community acquired  pneumonia)   UTI (lower urinary tract infection)   PULMONARY A: Hypoxic respiratory failure due to ARDS Need for mechanical ventilation Left pleural effusion P:   - Maintain ARDSNET protocol on vent - Obtain sputum cx. - Repeat CXR, consider thoracentesis to eval for empyema/parapneumonic effusion - Consider cardiology consultation depending on results  CARDIOVASCULAR A: Hx of mitral stenosis P:   Pulmonary edema may be cardiogenic Obtaining echo  RENAL A: No issues presently P:    GASTROINTESTINAL A: Need for nutrition P:   Consult to dietary for TF  HEMATOLOGIC A: Leukocytosis, worsening, likely in setting of sepsis P:   Tx for GNR bacteremia  INFECTIOUS A: GNR bacteremia Possible pneumonia as source P:   - Broaden from ceftriaxone to cefepime  - obtain lactic acid, trend.  ENDOCRINE A: No active issues P:    NEUROLOGIC A: Need for sedation due to mechanical ventilation P:   - Delerium protocol - Propofol with PRN fentanyl  BEST PRACTICE / DISPOSITION Level of Care:  ICU Primary Service:  PCCM Consultants:  None Code Status:  Full Diet:  TFs (not yet started) DVT Px:  Heparin GI Px:  H2 blocker (oral) Skin Integrity:  Intact Social / Family:  NOT updated, family meeting due.  TODAY'S SUMMARY: 49 y/o woman with hx of mitral stenosis p/w acute hypoxic respiratory failure likely due to cardiogenic and noncardiogenic pulmonary edema with GNR bacteremia of unclear source.  I have personally obtained a history, examined the patient, evaluated laboratory and imaging results, formulated the assessment and plan and placed orders.  CRITICAL CARE: The patient is critically ill with multiple organ systems failure and requires high complexity decision making for assessment and support, frequent evaluation and titration of therapies, application of advanced monitoring technologies and extensive interpretation of multiple databases. Critical Care Time  devoted to patient care services described in this note is 47 minutes.   Luz Brazen, MD Pulmonary and [place] Pager: [phone]   10/30/2014, 6:22 AM

## 2014-10-31 ENCOUNTER — Inpatient Hospital Stay (HOSPITAL_COMMUNITY): Payer: Medicare FFS

## 2014-10-31 LAB — URINE CULTURE: Colony Count: >=100000

## 2014-10-31 LAB — BLOOD GAS, ARTERIAL
ACID-BASE DEFICIT: 3.9 mmol/L — AB (ref 0.0–2.0)
BICARBONATE: 19.6 meq/L — AB (ref 20.0–24.0)
DRAWN BY: 398661
FIO2: 0.7 %
O2 Saturation: 96.8 %
PEEP: 10 cmH2O
PH ART: 7.422 (ref 7.350–7.450)
Patient temperature: 100.1
RATE: 30 resp/min
TCO2: 20.6 mmol/L (ref 0–100)
VT: 480 mL
pCO2 arterial: 31.1 mmHg — ABNORMAL LOW (ref 35.0–45.0)
pO2, Arterial: 87.5 mmHg (ref 80.0–100.0)

## 2014-10-31 LAB — COMPREHENSIVE METABOLIC PANEL
ALK PHOS: 73 U/L (ref 38–126)
ALT: 18 U/L (ref 14–54)
AST: 24 U/L (ref 15–41)
Albumin: 1.9 g/dL — ABNORMAL LOW (ref 3.5–5.0)
Anion gap: 9 (ref 5–15)
BUN: 15 mg/dL (ref 6–20)
CALCIUM: 8.3 mg/dL — AB (ref 8.9–10.3)
CO2: 19 mmol/L — AB (ref 22–32)
Chloride: 109 mmol/L (ref 101–111)
Creatinine, Ser: 1.08 mg/dL — ABNORMAL HIGH (ref 0.44–1.00)
GFR calc Af Amer: >60 mL/min (ref >60)
GFR calc non Af Amer: >60 mL/min (ref >60)
Glucose, Bld: 108 mg/dL — ABNORMAL HIGH (ref 70–99)
Potassium: 3.6 mmol/L (ref 3.5–5.1)
Sodium: 137 mmol/L (ref 135–145)
Total Bilirubin: 1.1 mg/dL (ref 0.3–1.2)
Total Protein: 5.3 g/dL — ABNORMAL LOW (ref 6.5–8.1)

## 2014-10-31 LAB — CBC
HCT: 25.5 % — ABNORMAL LOW (ref 36.0–46.0)
Hemoglobin: 8.7 g/dL — ABNORMAL LOW (ref 12.0–15.0)
MCH: 31.8 pg (ref 26.0–34.0)
MCHC: 34.1 g/dL (ref 30.0–36.0)
MCV: 93.1 fL (ref 78.0–100.0)
PLATELETS: 196 10*3/uL (ref 150–400)
RBC: 2.74 MIL/uL — AB (ref 3.87–5.11)
RDW: 13.3 % (ref 11.5–15.5)
WBC: 10.9 10*3/uL — ABNORMAL HIGH (ref 4.0–10.5)

## 2014-10-31 LAB — PHOSPHORUS: Phosphorus: 2.1 mg/dL — ABNORMAL LOW (ref 2.5–4.6)

## 2014-10-31 LAB — PROCALCITONIN: PROCALCITONIN: 9.17 ng/mL

## 2014-10-31 LAB — MAGNESIUM: Magnesium: 1.4 mg/dL — ABNORMAL LOW (ref 1.7–2.4)

## 2014-10-31 MED ORDER — MAGNESIUM SULFATE 4 GM/100ML IV SOLN
4.0000 g | Freq: Once | INTRAVENOUS | Status: AC
  Administered 2014-10-31: 4 g via INTRAVENOUS
  Filled 2014-10-31: qty 100

## 2014-10-31 MED ORDER — POTASSIUM PHOSPHATES 15 MMOLE/5ML IV SOLN
30.0000 mmol | Freq: Once | INTRAVENOUS | Status: AC
  Administered 2014-10-31: 30 mmol via INTRAVENOUS
  Filled 2014-10-31: qty 10

## 2014-10-31 NOTE — Progress Notes (Signed)
PULMONARY / CRITICAL CARE MEDICINE HISTORY AND PHYSICAL EXAMINATION   Name: Holly Figueroa MRN: [id number] DOB: [birth year]    ADMISSION DATE:  10/28/2014  PRIMARY SERVICE: PCCM  CHIEF COMPLAINT:  Fever, chills, SOB  BRIEF PATIENT DESCRIPTION:  49 yo female from [facility] with PNA, ARDS, septic shock.  SIGNIFICANT EVENTS: 5/05 Admit to [facility] 5/07 To [facility], start pressors, nimbex  STUDIES: 5/07 CT chest >> diffuse ASD, bibasilar consolidation, small b/l effusions 5/07 Echo >> mild LVH, EF 55 to 60%, mild/mod AS, mild/mod MS, mod LA dilation  SUBJECTIVE:  Remains on pressors, paralytic.  VITAL SIGNS: Temp:  [98.6 F (37 C)-101.6 F (38.7 C)] 99.5 F (37.5 C) (05/08 0756) Pulse Rate:  [62-91] 64 (05/08 1000) Resp:  [26-30] 30 (05/08 1000) BP: (73-129)/(32-60) 98/54 mmHg (05/08 1000) SpO2:  [92 %-100 %] 97 % (05/08 1000) Arterial Line BP: (88-152)/(29-53) 120/47 mmHg (05/08 1000) FiO2 (%):  [40 %-80 %] 60 % (05/08 0757) Weight:  [178 lb 9.2 oz (81 kg)] 178 lb 9.2 oz (81 kg) (05/08 0500) HEMODYNAMICS: CVP:  [8 mmHg-14 mmHg] 11 mmHg VENTILATOR SETTINGS: Vent Mode:  [-] PRVC FiO2 (%):  [40 %-80 %] 60 % Set Rate:  [30 bmp] 30 bmp Vt Set:  [480 mL] 480 mL PEEP:  [10 cmH20-12 cmH20] 10 cmH20 Plateau Pressure:  [26 cmH20-31 cmH20] 27 cmH20 INTAKE / OUTPUT: Intake/Output      05/07 0701 - 05/08 0700 05/08 0701 - 05/09 0700   I.V. (mL/kg) 1768.7 (21.8) 226.1 (2.8)   Other     NG/GT 60    IV Piggyback 950 135   Total Intake(mL/kg) 2778.7 (34.3) 361.1 (4.5)   Urine (mL/kg/hr) 1600 (0.8) 280 (0.8)   Total Output 1600 280   Net +1178.7 +81.1          PHYSICAL EXAMINATION: General: paralyzed Neuro: RASS -5 HEENT: ETT in place Cardiovascular:  Regular Lungs: decreased BS on Lt > Rt with b/l crackles Abdomen:  Soft, non tender Musculoskeletal: no edema Skin: no rashes  LABS:  CBC  Recent Labs Lab 10/28/14 2310 10/29/14 0439 10/31/14 0400  WBC 11.9* 18.3* 10.9*  HGB  11.3* 9.9* 8.7*  HCT 33.7* 29.4* 25.5*  PLT 269 262 196   BMET  Recent Labs Lab 10/28/14 2310 10/29/14 0439 10/31/14 0400  NA 137 140 137  K 3.3* 4.0 3.6  CL 107 113* 109  CO2 21* 21* 19*  BUN _0 CREATININE 1.22* 1.06* 1.08*  GLUCOSE 202* 161* 108*   Electrolytes  Recent Labs Lab 10/28/14 2310 10/29/14 0439 10/31/14 0400  CALCIUM 8.7* 8.0* 8.3*  MG  --   --  1.4*  PHOS  --   --  2.1*   Sepsis Markers  Recent Labs Lab 10/28/14 2315 10/30/14 0839 10/31/14 0400  LATICACIDVEN 1.72  --   --   PROCALCITON  --  14.91 9.17   ABG  Recent Labs Lab 10/30/14 0510 10/30/14 1321 10/31/14 0500  PHART 7.259* 7.346* 7.422  PCO2ART 40.8 32.0* 31.1*  PO2ART 56.0* 153.0* 87.5   Liver Enzymes  Recent Labs Lab 10/28/14 2310 10/29/14 0439 10/31/14 0400  AST _1 ALT 36 28 18  ALKPHOS 113 75 73  BILITOT 0.5 0.3 1.1  ALBUMIN 3.6 2.8* 1.9*    Glucose  Recent Labs Lab 10/30/14 0722  GLUCAP 161*    Imaging Ct Chest Wo Contrast  10/30/2014   CLINICAL DATA:  Community-acquired pneumonia. Rheumatic heart disease.  EXAM: CT CHEST  WITHOUT CONTRAST  TECHNIQUE: Multidetector CT imaging of the chest was performed following the standard protocol without IV contrast.  COMPARISON:  10/30/2014.  FINDINGS: Musculoskeletal: Normal.  Lungs: Diffuse airspace disease is present with collapse/ consolidation of the dependent portions of the upper and lower lobes. Diffuse ground-glass attenuation is present in the lungs with interlobular septal thickening.  Central airways: Endotracheal tube is present. Central airways appear patent. No fluid levels to suggest aspiration.  Vasculature: Low attenuation of the intravascular compartment compatible with anemia. No acute abnormality allowing for noncontrast technique.  Effusions: Small bilateral dependently layering pleural effusions. Small pericardial effusion/thickening.  Lymphadenopathy: No axillary adenopathy. Prominent  peritracheal and prevascular lymph nodes are likely reactive or congestive. Hilar adenopathy cannot be evaluated in the absence of contrast.  Esophagus: Enteric tube is present.  Upper abdomen: Cholecystectomy.  Upper abdomen grossly normal.  Other: LEFT IJ central line terminates in the mid SVC inferior to the carina.  IMPRESSION: 1. Diffuse airspace disease with collapse/consolidation of both lower lobes and dependent portions of the upper lobes. Differential considerations include multifocal pneumonia and/or pulmonary edema. 2. Support apparatus in good position.   Electronically Signed   By: Dereck Ligas M.D.   On: 10/30/2014 17:32   Dg Chest Port 1 View  10/31/2014   CLINICAL DATA:  Community require pneumonia.  Intubated.  EXAM: PORTABLE CHEST - 1 VIEW  COMPARISON:  Yesterday.  FINDINGS: Stable borderline enlarged cardiac silhouette. Mildly improved bilateral airspace opacity. No pleural fluid. Endotracheal tube tip 4 cm above the carina. Left jugular catheter tip in the superior vena cava. Nasogastric tube extending into the stomach. Mild lower thoracic spine degenerative changes.  IMPRESSION: Mildly improved bilateral pneumonia or alveolar edema.   Electronically Signed   By: Claudie Revering M.D.   On: 10/31/2014 08:05   Dg Chest Port 1 View  10/30/2014   CLINICAL DATA:  Respiratory failure.  EXAM: PORTABLE CHEST - 1 VIEW  COMPARISON:  Earlier today.  FINDINGS: Normal sized heart. Endotracheal tube in satisfactory position. Interval left jugular catheter with its tip in the superior vena cava. No pneumothorax. Nasogastric tube extending into the stomach. Diffuse bilateral airspace opacity with improvement. No pleural fluid.  IMPRESSION: Decreased diffuse bilateral alveolar edema.   Electronically Signed   By: Claudie Revering M.D.   On: 10/30/2014 09:25   Dg Chest Port 1 View  10/30/2014   CLINICAL DATA:  Fever and hypoxia  EXAM: PORTABLE CHEST - 1 VIEW  COMPARISON:  10/28/2014  FINDINGS: The patient has  been intubated. ET tube tip is 2.9 cm above the carina. There is worsened diffuse airspace opacity bilaterally, now involving the entire left lung and nearly all of the right lung with relative sparing of the right base. This could represent ARDS, infection, edema. No large effusion is evident. No pneumothorax is evident.  IMPRESSION: Satisfactory ET tube position. Worsened diffuse airspace opacities bilaterally.   Electronically Signed   By: Andreas Newport M.D.   On: 10/30/2014 05:40    ASSESSMENT / PLAN: PULMONARY A: Acute hypoxic respiratory failure 2nd to CAP and ARDS. Tobacco abuse. P:   ARDS protocol Added nimbex 5/07 >> planned to d/c on 5/09 F/u CXR, ABG PRN BDs  CARDIOVASCULAR A: Septic shock. Aortic stenosis, mitral stenosis. P:   Pressors for MAP > 65 Monitor CVP Continue ASA Hold outpt lasix, verapamil Will likely need cardiology assessment later this week >> has been seen by Dr. Einar Gip previously Diurese once off pressors  RENAL A:  AKI >> improving. Hypokalemia, hypomagnesemia, hypophosphatemia. P:   Monitor renal fx, urine outpt  GASTROINTESTINAL A: Nutrition. P:   NPO Protonix for SUP  HEMATOLOGIC A: Anemia of critical illness. P:   F/u CBC SQ heparin, SCDs  INFECTIOUS A: Septic shock 2nd to CAP with E coli bacteremia. P:   Day 4 of Abx, cefepime, levaquin D/c vancomycin 5/08 f/u procalcitonin  Blood 5/05 >> E coli Urine 5/05 >> E coli Legionella Ag 5/06 >>  ENDOCRINE A: No active issues. P:   Monitor blood sugar on BMET  NEUROLOGIC A: Acute encephalopathy 2nd to sepsis, respiratory failure. Hx of depression, anxiety on chronic benzo's. P:   RASS goal -5 while on nimbex Hold outpt xanax, prozac, trazodone, roxicet  Updated pts husband at bedside.  CC time 35 minutes.  Chesley Mires, MD [facility] Pulmonary/Critical Care 10/31/2014, 11:07 AM Pager:  [phone] After 3pm call: [phone]

## 2014-10-31 NOTE — Progress Notes (Signed)
eLink Physician-Brief Progress Note Patient Name: Holly Figueroa DOB: 07/02/1965 MRN: [id number]   Date of Service  10/31/2014  HPI/Events of Note  Hypokalemia, hypomag, hypophosphatemia  eICU Interventions  Potassium, magnesium, and phos replaced     Intervention Category Intermediate Interventions: Electrolyte abnormality - evaluation and management  DETERDING,ELIZABETH 10/31/2014, 6:40 AM

## 2014-11-01 ENCOUNTER — Ambulatory Visit (HOSPITAL_COMMUNITY): Payer: Medicare FFS

## 2014-11-01 ENCOUNTER — Inpatient Hospital Stay (HOSPITAL_COMMUNITY): Payer: Medicare FFS

## 2014-11-01 DIAGNOSIS — J80 Acute respiratory distress syndrome: Secondary | ICD-10-CM

## 2014-11-01 DIAGNOSIS — R6520 Severe sepsis without septic shock: Secondary | ICD-10-CM

## 2014-11-01 DIAGNOSIS — R6521 Severe sepsis with septic shock: Secondary | ICD-10-CM

## 2014-11-01 DIAGNOSIS — A419 Sepsis, unspecified organism: Secondary | ICD-10-CM

## 2014-11-01 DIAGNOSIS — R7881 Bacteremia: Secondary | ICD-10-CM

## 2014-11-01 LAB — BLOOD GAS, ARTERIAL
ACID-BASE DEFICIT: 5.3 mmol/L — AB (ref 0.0–2.0)
BICARBONATE: 18.3 meq/L — AB (ref 20.0–24.0)
Drawn by: 398661
FIO2: 0.5 %
LHR: 30 {breaths}/min
O2 SAT: 96.6 %
PATIENT TEMPERATURE: 98.5
PEEP/CPAP: 10 cmH2O
TCO2: 19.2 mmol/L (ref 0–100)
VT: 480 mL
pCO2 arterial: 28.7 mmHg — ABNORMAL LOW (ref 35.0–45.0)
pH, Arterial: 7.421 (ref 7.350–7.450)
pO2, Arterial: 96 mmHg (ref 80.0–100.0)

## 2014-11-01 LAB — BASIC METABOLIC PANEL
Anion gap: 9 (ref 5–15)
BUN: 13 mg/dL (ref 6–20)
CALCIUM: 8.4 mg/dL — AB (ref 8.9–10.3)
CO2: 18 mmol/L — ABNORMAL LOW (ref 22–32)
Chloride: 109 mmol/L (ref 101–111)
Creatinine, Ser: 0.9 mg/dL (ref 0.44–1.00)
GFR calc Af Amer: >60 mL/min (ref >60)
Glucose, Bld: 101 mg/dL — ABNORMAL HIGH (ref 70–99)
Potassium: 3.6 mmol/L (ref 3.5–5.1)
SODIUM: 136 mmol/L (ref 135–145)

## 2014-11-01 LAB — CULTURE, BLOOD (ROUTINE X 2)

## 2014-11-01 LAB — PHOSPHORUS: Phosphorus: 3.6 mg/dL (ref 2.5–4.6)

## 2014-11-01 LAB — CBC
HCT: 25.2 % — ABNORMAL LOW (ref 36.0–46.0)
Hemoglobin: 8.4 g/dL — ABNORMAL LOW (ref 12.0–15.0)
MCH: 30.4 pg (ref 26.0–34.0)
MCHC: 33.3 g/dL (ref 30.0–36.0)
MCV: 91.3 fL (ref 78.0–100.0)
PLATELETS: 258 10*3/uL (ref 150–400)
RBC: 2.76 MIL/uL — ABNORMAL LOW (ref 3.87–5.11)
RDW: 13.5 % (ref 11.5–15.5)
WBC: 10.5 10*3/uL (ref 4.0–10.5)

## 2014-11-01 LAB — GLUCOSE, CAPILLARY: Glucose-Capillary: 114 mg/dL — ABNORMAL HIGH (ref 70–99)

## 2014-11-01 LAB — LEGIONELLA ANTIGEN, URINE

## 2014-11-01 LAB — MAGNESIUM: Magnesium: 2 mg/dL (ref 1.7–2.4)

## 2014-11-01 LAB — PROCALCITONIN: PROCALCITONIN: 5.03 ng/mL

## 2014-11-01 MED ORDER — VITAL HIGH PROTEIN PO LIQD
1000.0000 mL | ORAL | Status: DC
  Filled 2014-11-01 (×2): qty 1000

## 2014-11-01 MED ORDER — FAMOTIDINE 40 MG/5ML PO SUSR
20.0000 mg | Freq: Two times a day (BID) | ORAL | Status: DC
  Administered 2014-11-01 – 2014-11-02 (×3): 20 mg
  Filled 2014-11-01 (×4): qty 2.5

## 2014-11-01 MED ORDER — PRO-STAT SUGAR FREE PO LIQD
30.0000 mL | Freq: Two times a day (BID) | ORAL | Status: AC
  Administered 2014-11-01 (×2): 30 mL
  Filled 2014-11-01 (×2): qty 30

## 2014-11-01 MED ORDER — VITAL HIGH PROTEIN PO LIQD
1000.0000 mL | ORAL | Status: DC
  Administered 2014-11-01 – 2014-11-02 (×2)
  Filled 2014-11-01 (×3): qty 1000

## 2014-11-01 MED ORDER — ALPRAZOLAM 0.5 MG PO TABS
1.0000 mg | ORAL_TABLET | Freq: Three times a day (TID) | ORAL | Status: DC
  Administered 2014-11-01 – 2014-11-02 (×3): 1 mg
  Filled 2014-11-01 (×4): qty 2

## 2014-11-01 MED ORDER — CEFAZOLIN SODIUM 1-5 GM-% IV SOLN
1.0000 g | Freq: Four times a day (QID) | INTRAVENOUS | Status: DC
  Administered 2014-11-01 – 2014-11-03 (×8): 1 g via INTRAVENOUS
  Filled 2014-11-01 (×10): qty 50

## 2014-11-01 NOTE — Progress Notes (Signed)
Nutrition Follow-up  DOCUMENTATION CODES:  Not applicable  INTERVENTION:  Tube feeding:  Initiate TF via OGT with Vital High Protein at 25 ml/h and Prostat 30 ml BID on day 1; on day 2, d/c Prostat and increase to goal rate of 50 ml/h (1200 ml per day) to provide 1200 kcals, 105 gm protein, 1003 ml free water daily.  Total intake (including kcals from Propofol) is 1702 kcals (98% of estimated needs).   NUTRITION DIAGNOSIS:  Inadequate oral intake related to inability to eat as evidenced by NPO status.  Ongoing.  GOAL:  Patient will meet greater than or equal to 90% of their needs  Unmet.  MONITOR:  Vent status, Labs, Weight trends, TF tolerance  REASON FOR ASSESSMENT:  Consult Enteral/tube feeding initiation and management  ASSESSMENT:  Patient admitted from [facility] on 5/5 with PNA, ARDS, septic shock.  Received MD Consult for TF initiation and management.  Patient is currently intubated on ventilator support MV: 8.2 L/min Temp (24hrs), Avg:99.3 F (37.4 C), Min:98.5 F (36.9 C), Max:100 F (37.8 C)  Propofol: 19 ml/h providing 502 kcals per day.  Height:  Ht Readings from Last 1 Encounters:  10/30/14 5' 6" (1.676 m)    Weight:  Wt Readings from Last 1 Encounters:  11/01/14 179 lb 7.3 oz (81.4 kg)   10/30/14 171 lb 11.8 oz (77.9 kg)        Ideal Body Weight:  59.1 kg  Wt Readings from Last 10 Encounters:  11/01/14 179 lb 7.3 oz (81.4 kg)  07/10/12 183 lb 9.6 oz (83.28 kg)  06/04/12 177 lb 3.2 oz (80.377 kg)  10/11/10 181 lb 12.8 oz (82.464 kg)    BMI:  Body mass index is 28.98 kg/(m^2).  Estimated Nutritional Needs:  Kcal:  1731  Protein:  100-115 gm  Fluid:  2 L  Skin:  Reviewed, no issues  Diet Order:     EDUCATION NEEDS:  No education needs identified at this time   Intake/Output Summary (Last 24 hours) at 11/01/14 1248 Last data filed at 11/01/14 1103  Gross per 24 hour  Intake 1680.28 ml  Output   1495 ml  Net  185.28 ml    Last BM:  5/9   Molli Barrows, RD, LDN, [place] Pager [phone] After Hours Pager [phone]

## 2014-11-01 NOTE — Care Management Note (Signed)
Case Management Note  Patient Details  Name: Holly Figueroa MRN: [id number] Date of Birth: 12/15/1965  Subjective/Objective:    Pt admitted with bil pna                Action/Plan: NCM to follow for d/c needs  Expected Discharge Date:         Expected Discharge Plan:  Home/Self Care  In-House Referral:  NA  Discharge planning Services  CM Consult  Post Acute Care Choice:  Durable Medical Equipment Choice offered to:  Patient  DME Arranged:  Oxygen DME Agency:  [place]  HH Arranged:    [place] Agency:     Status of Service:  Completed, signed off  Medicare Important Message Given:  Yes Date Medicare IM Given:  10/29/14 Medicare IM give by:  Jolene Provost, RN, MSN, CM Date Additional Medicare IM Given:    Additional Medicare Important Message give by:     If discussed at [place] of Stay Meetings, dates discussed:  11/02/14  Additional Comments:    Dawayne Patricia, RN 11/01/2014, 4:43 PM

## 2014-11-01 NOTE — Progress Notes (Signed)
PULMONARY / CRITICAL CARE MEDICINE HISTORY AND PHYSICAL EXAMINATION   Name: Holly Figueroa MRN: [id number] DOB: 04-Mar-1966    ADMISSION DATE:  10/28/2014  PRIMARY SERVICE: PCCM  CHIEF COMPLAINT:  Fever, chills, SOB  BRIEF PATIENT DESCRIPTION:  49 yo female from [facility] with PNA, ARDS, septic shock.  SIGNIFICANT EVENTS/STUDIES: 5/05 Admit to [facility] 5/07 To [facility], start pressors, nimbex 5/07 CT chest >> diffuse ASD, bibasilar consolidation, small b/l effusions 5/07 Echo:  mild LVH, EF 55 to 60%, mild/mod AS, mild/mod MS, mod LA dilation 5/09 Anisocoria (L pupil 7-8 mm, R 3-4). Nimbex DC'd. + F/C on WUA. STAT CT head ordered 5/09 CT head: No CT evidence for acute infarction. 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 MRI of the brain without and with contrast is recommended to further evaluate    INDWELLING DEVICES:: ETT 5/07 >>  L IJ CVL 5/07 >>  R radial A-line 5/07 >>   MICRO DATA: MRSA PCR 5/06 >> NEG Urine 5/05 >> E coli (pansens) Blood 5/05 >> E coli (pansens) Legionella Ag 5/06 >> NEG Strep Ag 5/06 >> NEG  ANTIMICROBIALS:  Azithro 5/06 >> 5/07 Ceftriaxone 5/06 >> 5/07 Levofloxacin 5/07 >> 5/08  Vanc 5/07 >> 5/08 Cefepime 5/07 >> 5/09 Cefazolin 5/07 >>    SUBJECTIVE:  RASS -1. + F/C  VITAL SIGNS: Temp:  [98.5 F (36.9 C)-100 F (37.8 C)] 98.7 F (37.1 C) (05/09 1121) Pulse Rate:  [64-86] 76 (05/09 1304) Resp:  [10-30] 20 (05/09 1304) BP: (76-116)/(43-64) 116/57 mmHg (05/09 1304) SpO2:  [76 %-100 %] 76 % (05/09 1304) Arterial Line BP: (84-155)/(38-58) 84/42 mmHg (05/09 1300) FiO2 (%):  [40 %-60 %] 40 % (05/09 1304) Weight:  [81.4 kg (179 lb 7.3 oz)] 81.4 kg (179 lb 7.3 oz) (05/09 0500) HEMODYNAMICS: CVP:  [10 mmHg-11 mmHg] 10 mmHg VENTILATOR SETTINGS: Vent Mode:  [-] PRVC FiO2 (%):  [40 %-60 %] 40 % Set Rate:  [20 bmp-30 bmp] 20 bmp Vt Set:  [480 mL] 480 mL PEEP:  [5 cmH20-10 cmH20] 5  cmH20 Pressure Support:  [15 cmH20] 15 cmH20 Plateau Pressure:  [17 cmH20-31 cmH20] 17 cmH20 INTAKE / OUTPUT: Intake/Output      05/08 0701 - 05/09 0700 05/09 0701 - 05/10 0700   I.V. (mL/kg) 1720.3 (21.1) 241.2 (3)   NG/GT 30 60   IV Piggyback 185 100   Total Intake(mL/kg) 1935.3 (23.8) 401.2 (4.9)   Urine (mL/kg/hr) 1560 (0.8) 390 (0.6)   Total Output 1560 390   Net +375.3 +11.2          PHYSICAL EXAMINATION: General: RASS -1, + F/C Neuro: anisocoria (L>R). MAEs, Patellar DTRs 3+ and symmetric, Babinski neg HEENT: NCAT Cardiovascular:  Reg, 3/6 systolic M of aortic origin, + S4 gallop Lungs: dependent crackles Abdomen:  Soft, non tender Musculoskeletal: no edema Skin: no rashes  LABS:  CBC  Recent Labs Lab 10/29/14 0439 10/31/14 0400 11/01/14 0455  WBC 18.3* 10.9* 10.5  HGB 9.9* 8.7* 8.4*  HCT 29.4* 25.5* 25.2*  PLT 262 196 258   BMET  Recent Labs Lab 10/29/14 0439 10/31/14 0400 11/01/14 0455  NA 140 137 136  K 4.0 3.6 3.6  CL 113* 109 109  CO2 21* 19* 18*  BUN _0 CREATININE 1.06* 1.08* 0.90  GLUCOSE 161* 108* 101*   Electrolytes  Recent Labs Lab 10/29/14 0439 10/31/14 0400 11/01/14 0455  CALCIUM 8.0* 8.3* 8.4*  MG  --  1.4* 2.0  PHOS  --  2.1* 3.6   Sepsis Markers  Recent Labs Lab 10/28/14 2315 10/30/14 0839 10/31/14 0400 11/01/14 0455  LATICACIDVEN 1.72  --   --   --   PROCALCITON  --  14.91 9.17 5.03   ABG  Recent Labs Lab 10/30/14 1321 10/31/14 0500 11/01/14 0442  PHART 7.346* 7.422 7.421  PCO2ART 32.0* 31.1* 28.7*  PO2ART 153.0* 87.5 96.0   Liver Enzymes  Recent Labs Lab 10/28/14 2310 10/29/14 0439 10/31/14 0400  AST _0 ALT 36 28 18  ALKPHOS 113 75 73  BILITOT 0.5 0.3 1.1  ALBUMIN 3.6 2.8* 1.9*    Glucose  Recent Labs Lab 10/30/14 0722  GLUCAP 161*    CXR: improving bilateral infiltrates - edema and/or ALI   ASSESSMENT / PLAN: PULMONARY A: Acute hypoxic respiratory failure ARDS vs  edema, CXR improving  Smoker P:   Cont vent support - settings reviewed and/or adjusted PSV as tolerated 5/09 Cont vent bundle Daily SBT if/when meets criteria  CARDIOVASCULAR A: Septic shock, resolved Mild to moderate aortic stenosis  Mild to mod mitral stenosis. P:   Wean NE to off for MAP goal > 65 mmHg Continue ASA Holding outpt lasix, verapamil Will likely need cardiology assessment later this week >> has been seen by Dr. Einar Gip previously Diuresis as tolerated  RENAL A: AKI, resolved Hypokalemia, resolved Hypomagnesemia, resolved Hypophosphatemia, resolved P:   Monitor BMET intermittently Monitor I/Os Correct electrolytes as indicated  GASTROINTESTINAL A: No issues P:   SUP: enteral famotidine Begin TFs 5/09  HEMATOLOGIC A: Anemia of critical illness P:   DVT px: SQ heparin Monitor CBC intermittently Transfuse per usual ICU guidelines  INFECTIOUS A: Severe sepsis E coli UTI E coli bacteremia Doubt PNA P:   Micro and abx as above  ENDOCRINE A: No active issues. P:   Monitor blood sugar on BMET  NEUROLOGIC A: Acute encephalopathy Hx of depression, anxiety on chronic benzo's Anisocoria of unclear significance Incidental finding of 7 mm hyper attenuating lesion along the R septum pellucidum  Unclear significance P:   RASS goal: 0 to -1 Resume alprazolam @ reduced dose Cont propofol Cont fent gtt Hold outpt prozac, trazodone, roxicet Daily WUA Consider Neuro eval 5/10  Updated pts husband at bedside.  CC time 45 minutes.  Merton Border, MD ; [facility] [phone].  After 5:30 PM or weekends, call [phone]

## 2014-11-01 NOTE — Progress Notes (Signed)
Utilization review completed

## 2014-11-01 NOTE — Progress Notes (Signed)
During assessment, RT noticed pt. Art-line was dislodged. Pt. Reading appeared to be really positional according to the RN. RN responded to assist with cleaning pt arm after art-line was noticed to be completely out.

## 2014-11-02 ENCOUNTER — Inpatient Hospital Stay (HOSPITAL_COMMUNITY): Payer: Medicare FFS

## 2014-11-02 DIAGNOSIS — J9601 Acute respiratory failure with hypoxia: Secondary | ICD-10-CM

## 2014-11-02 LAB — BASIC METABOLIC PANEL
ANION GAP: 5 (ref 5–15)
BUN: 12 mg/dL (ref 6–20)
CHLORIDE: 112 mmol/L — AB (ref 101–111)
CO2: 22 mmol/L (ref 22–32)
CREATININE: 0.79 mg/dL (ref 0.44–1.00)
Calcium: 8.6 mg/dL — ABNORMAL LOW (ref 8.9–10.3)
GFR calc Af Amer: >60 mL/min (ref >60)
GFR calc non Af Amer: >60 mL/min (ref >60)
GLUCOSE: 127 mg/dL — AB (ref 70–99)
Potassium: 3.7 mmol/L (ref 3.5–5.1)
Sodium: 139 mmol/L (ref 135–145)

## 2014-11-02 LAB — CBC
HEMATOCRIT: 23.6 % — AB (ref 36.0–46.0)
Hemoglobin: 7.7 g/dL — ABNORMAL LOW (ref 12.0–15.0)
MCH: 30.4 pg (ref 26.0–34.0)
MCHC: 32.6 g/dL (ref 30.0–36.0)
MCV: 93.3 fL (ref 78.0–100.0)
Platelets: 279 10*3/uL (ref 150–400)
RBC: 2.53 MIL/uL — ABNORMAL LOW (ref 3.87–5.11)
RDW: 13.7 % (ref 11.5–15.5)
WBC: 11.5 10*3/uL — ABNORMAL HIGH (ref 4.0–10.5)

## 2014-11-02 LAB — GLUCOSE, CAPILLARY
GLUCOSE-CAPILLARY: 127 mg/dL — AB (ref 70–99)
Glucose-Capillary: 128 mg/dL — ABNORMAL HIGH (ref 70–99)
Glucose-Capillary: 123 mg/dL — ABNORMAL HIGH (ref 70–99)
Glucose-Capillary: 139 mg/dL — ABNORMAL HIGH (ref 70–99)
Glucose-Capillary: 147 mg/dL — ABNORMAL HIGH (ref 70–99)
Glucose-Capillary: 116 mg/dL — ABNORMAL HIGH (ref 70–99)

## 2014-11-02 LAB — MAGNESIUM: Magnesium: 2.1 mg/dL (ref 1.7–2.4)

## 2014-11-02 LAB — PHOSPHORUS: Phosphorus: 4.2 mg/dL (ref 2.5–4.6)

## 2014-11-02 MED ORDER — HALOPERIDOL LACTATE 5 MG/ML IJ SOLN
INTRAMUSCULAR | Status: AC
  Filled 2014-11-02: qty 1

## 2014-11-02 MED ORDER — ALPRAZOLAM 0.5 MG PO TABS
1.0000 mg | ORAL_TABLET | Freq: Three times a day (TID) | ORAL | Status: DC
  Administered 2014-11-03 – 2014-11-04 (×3): 1 mg via ORAL
  Filled 2014-11-02 (×3): qty 2

## 2014-11-02 MED ORDER — IPRATROPIUM-ALBUTEROL 0.5-2.5 (3) MG/3ML IN SOLN
3.0000 mL | Freq: Four times a day (QID) | RESPIRATORY_TRACT | Status: DC
  Administered 2014-11-02 – 2014-11-03 (×3): 3 mL via RESPIRATORY_TRACT
  Filled 2014-11-02 (×3): qty 3

## 2014-11-02 MED ORDER — HALOPERIDOL LACTATE 5 MG/ML IJ SOLN
2.0000 mg | INTRAMUSCULAR | Status: DC | PRN
  Administered 2014-11-02 – 2014-11-03 (×4): 4 mg via INTRAVENOUS
  Filled 2014-11-02 (×3): qty 1

## 2014-11-02 MED ORDER — METOPROLOL TARTRATE 1 MG/ML IV SOLN: 5.0000 mg | Freq: Once | INTRAVENOUS | Status: AC

## 2014-11-02 MED ORDER — PROPOFOL 1000 MG/100ML IV EMUL: 5.0000 ug/kg/min | INTRAVENOUS | Status: DC

## 2014-11-02 MED ORDER — POTASSIUM CHLORIDE 10 MEQ/50ML IV SOLN
10.0000 meq | INTRAVENOUS | Status: AC
  Administered 2014-11-02 (×4): 10 meq via INTRAVENOUS
  Filled 2014-11-02: qty 50

## 2014-11-02 MED ORDER — FAMOTIDINE IN NACL 20-0.9 MG/50ML-% IV SOLN
20.0000 mg | Freq: Two times a day (BID) | INTRAVENOUS | Status: DC
  Administered 2014-11-02 – 2014-11-04 (×4): 20 mg via INTRAVENOUS
  Filled 2014-11-02 (×5): qty 50

## 2014-11-02 MED ORDER — FUROSEMIDE 10 MG/ML IJ SOLN
40.0000 mg | Freq: Once | INTRAMUSCULAR | Status: AC
  Administered 2014-11-02: 40 mg via INTRAVENOUS
  Filled 2014-11-02: qty 4

## 2014-11-02 MED ORDER — BUDESONIDE 0.25 MG/2ML IN SUSP
0.2500 mg | Freq: Four times a day (QID) | RESPIRATORY_TRACT | Status: DC
  Administered 2014-11-02 – 2014-11-03 (×3): 0.25 mg via RESPIRATORY_TRACT
  Filled 2014-11-02 (×7): qty 2

## 2014-11-02 MED ORDER — METOPROLOL TARTRATE 1 MG/ML IV SOLN
2.5000 mg | INTRAVENOUS | Status: DC | PRN
  Administered 2014-11-02: 5 mg via INTRAVENOUS
  Filled 2014-11-02: qty 5

## 2014-11-02 MED ORDER — METOPROLOL TARTRATE 1 MG/ML IV SOLN
INTRAVENOUS | Status: AC
  Administered 2014-11-02: 5 mg
  Filled 2014-11-02: qty 5

## 2014-11-02 NOTE — Progress Notes (Signed)
PULMONARY / CRITICAL CARE MEDICINE HISTORY AND PHYSICAL EXAMINATION   Name: Holly Figueroa MRN: [id number] DOB: 06-Dec-1965    ADMISSION DATE:  10/28/2014  PRIMARY SERVICE: PCCM  CHIEF COMPLAINT:  Fever, chills, SOB  BRIEF PATIENT DESCRIPTION:  49 yo female from [facility] with PNA, ARDS, septic shock.  SIGNIFICANT EVENTS/STUDIES: 5/05 Admit to [facility] 5/07 To [facility], start pressors, nimbex 5/07 CT chest >> diffuse ASD, bibasilar consolidation, small b/l effusions 5/07 Echo:  mild LVH, EF 55 to 60%, mild/mod AS, mild/mod MS, mod LA dilation 5/09 Anisocoria (L pupil 7-8 mm, R 3-4). Nimbex DC'd. + F/C on WUA. STAT CT head ordered 5/09 CT head: No CT evidence for acute infarction. 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 MRI of the brain without and with contrast is recommended to further evaluate  5/10 Anisocoria resolved. Passed SBT with marginal gas exchange. Extubated. Required high flow O2 > BiPAP. Agitated at times. PRN Haldol  INDWELLING DEVICES:: ETT 5/07 >> 5/10 L IJ CVL 5/07 >>  R radial A-line 5/07 >> 5/10  MICRO DATA: MRSA PCR 5/06 >> NEG Urine 5/05 >> E coli (pansens) Blood 5/05 >> E coli (pansens) Legionella Ag 5/06 >> NEG Strep Ag 5/06 >> NEG  ANTIMICROBIALS:  Azithro 5/06 >> 5/07 Ceftriaxone 5/06 >> 5/07 Levofloxacin 5/07 >> 5/08  Vanc 5/07 >> 5/08 Cefepime 5/07 >> 5/09 Cefazolin 5/07 >>    SUBJECTIVE:  RASS -1. + F/C  VITAL SIGNS: Temp:  [98 F (36.7 C)-100.9 F (38.3 C)] 100.9 F (38.3 C) (05/10 1550) Pulse Rate:  [71-137] 93 (05/10 1600) Resp:  [12-32] 18 (05/10 1600) BP: (83-130)/(41-90) 89/56 mmHg (05/10 1500) SpO2:  [59 %-96 %] 92 % (05/10 1600) Arterial Line BP: (83-126)/(58-89) 94/89 mmHg (05/09 2300) FiO2 (%):  [40 %-100 %] 70 % (05/10 1500) Weight:  [79 kg (174 lb 2.6 oz)] 79 kg (174 lb 2.6 oz) (05/10 0431) HEMODYNAMICS: CVP:  [10 mmHg-11 mmHg] 10 mmHg VENTILATOR SETTINGS: Vent  Mode:  [-] CPAP;PSV FiO2 (%):  [40 %-100 %] 70 % Set Rate:  [20 bmp] 20 bmp Vt Set:  [480 mL] 480 mL PEEP:  [5 cmH20] 5 cmH20 Pressure Support:  [15 cmH20] 15 cmH20 Plateau Pressure:  [23 cmH20-25 cmH20] 23 cmH20 INTAKE / OUTPUT: Intake/Output      05/09 0701 - 05/10 0700 05/10 0701 - 05/11 0700   I.V. (mL/kg) 647.2 (8.2) 84.5 (1.1)   Other 350    NG/GT 498.8 50   IV Piggyback 250 250   Total Intake(mL/kg) 1746 (22.1) 384.5 (4.9)   Urine (mL/kg/hr) 1450 (0.8) 2475 (3.2)   Total Output 1450 2475   Net +296 -2090.5          PHYSICAL EXAMINATION: General: RASS 0, + F/C Neuro: PERRL. MAEs HEENT: NCAT Cardiovascular:  Reg, 3/6 systolic M of aortic origin, + S4 gallop Lungs: bronchial BS in LLL, R basilar crackles Abdomen:  Soft, non tender Ext: no edema  LABS:  CBC  Recent Labs Lab 10/31/14 0400 11/01/14 0455 11/02/14 0431  WBC 10.9* 10.5 11.5*  HGB 8.7* 8.4* 7.7*  HCT 25.5* 25.2* 23.6*  PLT 196 258 279   BMET  Recent Labs Lab 10/31/14 0400 11/01/14 0455 11/02/14 0431  NA 137 136 139  K 3.6 3.6 3.7  CL 109 109 112*  CO2 19* 18* 22  BUN _0 CREATININE 1.08* 0.90 0.79  GLUCOSE 108* 101* 127*   Electrolytes  Recent Labs Lab 10/31/14 0400 11/01/14 0455 11/02/14 0431  CALCIUM 8.3* 8.4* 8.6*  MG 1.4* 2.0 2.1  PHOS 2.1* 3.6 4.2   Sepsis Markers  Recent Labs Lab 10/28/14 2315 10/30/14 0839 10/31/14 0400 11/01/14 0455  LATICACIDVEN 1.72  --   --   --   PROCALCITON  --  14.91 9.17 5.03   ABG  Recent Labs Lab 10/30/14 1321 10/31/14 0500 11/01/14 0442  PHART 7.346* 7.422 7.421  PCO2ART 32.0* 31.1* 28.7*  PO2ART 153.0* 87.5 96.0   Liver Enzymes  Recent Labs Lab 10/28/14 2310 10/29/14 0439 10/31/14 0400  AST _0 ALT 36 28 18  ALKPHOS 113 75 73  BILITOT 0.5 0.3 1.1  ALBUMIN 3.6 2.8* 1.9*    Glucose  Recent Labs Lab 11/01/14 2040 11/02/14 0048 11/02/14 0417 11/02/14 0752 11/02/14 1144 11/02/14 1521  GLUCAP 114*  128* 123* 139* 147* 127*    CXR: Edema pattern, LLL Atx vs consolidation   ASSESSMENT / PLAN: PULMONARY A: Acute hypoxic respiratory failure ARDS vs edema LLL Atx vs consolidation  Smoker P:   Monitor in ICU post extubation Supp O2 to maintain SpO2 > 86% PRN BiPAP Add scheduled nebulized steroids and bronchodilators Lasix X 1 5/10 Cont PRN nebs  CARDIOVASCULAR A: Septic shock, resolved Mild to moderate aortic stenosis  Mild to mod mitral stenosis P:   Wean NE to off for MAP goal > 65 mmHg Continue ASA Holding outpt lasix, verapamil Consider Cards input at some point (has seen Ganji in past) Recheck CXR 5/11  RENAL A: AKI, resolved Hypokalemia, resolved Hypomagnesemia, resolved Hypophosphatemia, resolved P:   Monitor BMET intermittently Monitor I/Os Correct electrolytes as indicated  GASTROINTESTINAL A: Post extubation dysphagia P:   SUP: IV famotidine NPO post extubation  HEMATOLOGIC A: Anemia of critical illness P:   DVT px: SQ heparin Monitor CBC intermittently Transfuse per usual ICU guidelines  INFECTIOUS A: Severe sepsis E coli UTI E coli bacteremia LLL consolidation, r/o PNA P:   Micro and abx as above PCT ordered for 5/11  ENDOCRINE A: No active issues P:   Monitor blood sugar on BMET  NEUROLOGIC A: Acute encephalopathy Hx of depression, anxiety on chronic high dose alprazolam  Anisocoria, resolved Incidental finding of 7 mm hyper attenuating lesion along the R septum pellucidum  Unclear significance P:   RASS goal: 0 Cont alprazolam @ reduced dose DC propofol and fent gtt Holding outpt prozac, trazodone, roxicet MRI to elucidate CT findings when stable  Updated pts husband at bedside  CC time 55 minutes.  Merton Border, MD ; [facility] [phone].  After 5:30 PM or weekends, call [phone]

## 2014-11-02 NOTE — Progress Notes (Signed)
Wasted 180cc/1853mg fentanyl gtt with IBirder RobsonRN down the sink

## 2014-11-02 NOTE — Procedures (Signed)
Extubation Procedure Note  Patient Details:   Name: Holly Figueroa DOB: 05/27/1966 MRN: [id number]   Airway Documentation:  Airway 7.5 mm (Active)  Secured at (cm) 21 cm 11/02/2014  8:04 AM  Measured From Lips 11/02/2014  8:04 AM  Secured Location Left 11/02/2014  8:04 AM  Secured By [employer] 11/02/2014  8:04 AM  Tube Holder Repositioned Yes 11/02/2014  8:04 AM  Cuff Pressure (cm H2O) 26 cm H2O 11/02/2014  8:04 AM  Site Condition Dry 11/01/2014  3:49 PM    Evaluation  O2 sats: transiently fell during during procedure Complications: No apparent complications Patient did not tolerate procedure well. Bilateral Breath Sounds: Rhonchi, Diminished Suctioning: Oral, Airway Yes   At 0930 pt extubated and placed on 100%nrb mk due to decreased sats of 80% immediately after extubation. Strong productive cough noted. SPO2 87-90% ON 100% NRB Dr Alva Garnet aware of pts sats before and after extubation.    Duffy Rhody 11/02/2014, 10:08 AM

## 2014-11-02 NOTE — Progress Notes (Signed)
Patient desats frequently, trying to get OOB on multiple occasions, placed on Bipap, order received for Haldol

## 2014-11-02 NOTE — Progress Notes (Signed)
Pt's arterial line came out. P. Hoffman made aware.

## 2014-11-03 ENCOUNTER — Inpatient Hospital Stay (HOSPITAL_COMMUNITY): Payer: Medicare FFS

## 2014-11-03 LAB — GLUCOSE, CAPILLARY
GLUCOSE-CAPILLARY: 116 mg/dL — AB (ref 70–99)
Glucose-Capillary: 105 mg/dL — ABNORMAL HIGH (ref 70–99)
Glucose-Capillary: 118 mg/dL — ABNORMAL HIGH (ref 70–99)
Glucose-Capillary: 183 mg/dL — ABNORMAL HIGH (ref 70–99)

## 2014-11-03 LAB — CBC
HCT: 25.1 % — ABNORMAL LOW (ref 36.0–46.0)
HEMOGLOBIN: 8.2 g/dL — AB (ref 12.0–15.0)
MCH: 30.5 pg (ref 26.0–34.0)
MCHC: 32.7 g/dL (ref 30.0–36.0)
MCV: 93.3 fL (ref 78.0–100.0)
Platelets: 335 10*3/uL (ref 150–400)
RBC: 2.69 MIL/uL — AB (ref 3.87–5.11)
RDW: 13.7 % (ref 11.5–15.5)
WBC: 14.9 10*3/uL — ABNORMAL HIGH (ref 4.0–10.5)

## 2014-11-03 LAB — BASIC METABOLIC PANEL
Anion gap: 9 (ref 5–15)
BUN: 18 mg/dL (ref 6–20)
CALCIUM: 9.2 mg/dL (ref 8.9–10.3)
CO2: 26 mmol/L (ref 22–32)
Chloride: 112 mmol/L — ABNORMAL HIGH (ref 101–111)
Creatinine, Ser: 0.87 mg/dL (ref 0.44–1.00)
GLUCOSE: 126 mg/dL — AB (ref 70–99)
Potassium: 4 mmol/L (ref 3.5–5.1)
SODIUM: 147 mmol/L — AB (ref 135–145)

## 2014-11-03 LAB — PROCALCITONIN: Procalcitonin: 1.94 ng/mL

## 2014-11-03 LAB — BRAIN NATRIURETIC PEPTIDE: B Natriuretic Peptide: 242.8 pg/mL — ABNORMAL HIGH (ref 0.0–100.0)

## 2014-11-03 MED ORDER — ARFORMOTEROL TARTRATE 15 MCG/2ML IN NEBU
15.0000 ug | INHALATION_SOLUTION | Freq: Two times a day (BID) | RESPIRATORY_TRACT | Status: DC
  Administered 2014-11-03 – 2014-11-08 (×11): 15 ug via RESPIRATORY_TRACT
  Filled 2014-11-03 (×14): qty 2

## 2014-11-03 MED ORDER — ENOXAPARIN SODIUM 40 MG/0.4ML ~~LOC~~ SOLN
40.0000 mg | SUBCUTANEOUS | Status: DC
  Administered 2014-11-03 – 2014-11-07 (×5): 40 mg via SUBCUTANEOUS
  Filled 2014-11-03 (×6): qty 0.4

## 2014-11-03 MED ORDER — BUDESONIDE 0.25 MG/2ML IN SUSP
0.5000 mg | Freq: Two times a day (BID) | RESPIRATORY_TRACT | Status: DC
  Administered 2014-11-03 – 2014-11-04 (×2): 0.5 mg via RESPIRATORY_TRACT
  Filled 2014-11-03 (×7): qty 4

## 2014-11-03 MED ORDER — FUROSEMIDE 10 MG/ML IJ SOLN
40.0000 mg | Freq: Once | INTRAMUSCULAR | Status: AC
  Administered 2014-11-03: 40 mg via INTRAVENOUS
  Filled 2014-11-03: qty 4

## 2014-11-03 MED ORDER — CEFAZOLIN SODIUM 1-5 GM-% IV SOLN
1.0000 g | Freq: Three times a day (TID) | INTRAVENOUS | Status: AC
  Administered 2014-11-03 – 2014-11-04 (×5): 1 g via INTRAVENOUS
  Filled 2014-11-03 (×5): qty 50

## 2014-11-03 MED ORDER — POTASSIUM CHLORIDE 2 MEQ/ML IV SOLN
INTRAVENOUS | Status: DC
  Administered 2014-11-03 – 2014-11-04 (×2): via INTRAVENOUS
  Filled 2014-11-03 (×3): qty 1000

## 2014-11-03 NOTE — Progress Notes (Signed)
PULMONARY / CRITICAL CARE MEDICINE HISTORY AND PHYSICAL EXAMINATION   Name: Holly Figueroa MRN: [id number] DOB: 09/17/1965    ADMISSION DATE:  10/28/2014  PRIMARY SERVICE: PCCM  CHIEF COMPLAINT:  Fever, chills, SOB  BRIEF PATIENT DESCRIPTION:  49 yo female from [facility] with PNA, ARDS, septic shock.  SIGNIFICANT EVENTS/STUDIES: 5/05 Admit to [facility] 5/07 To [facility], start pressors, nimbex 5/07 CT chest >> diffuse ASD, bibasilar consolidation, small b/l effusions 5/07 Echo:  mild LVH, EF 55 to 60%, mild/mod AS, mild/mod MS, mod LA dilation 5/09 Anisocoria (L pupil 7-8 mm, R 3-4). Nimbex DC'd. + F/C on WUA. STAT CT head ordered 5/09 CT head: No CT evidence for acute infarction. 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 MRI of the brain without and with contrast is recommended to further evaluate  5/10 Anisocoria resolved. Passed SBT with marginal gas exchange. Extubated. Required high flow O2 > BiPAP. Agitated at times. PRN Haldol. Off vasopressors 5/11 Less agitated and confused. Still required high flow O2  INDWELLING DEVICES:: ETT 5/07 >> 5/10 L IJ CVL 5/07 >>  R radial A-line 5/07 >> 5/10  MICRO DATA: MRSA PCR 5/06 >> NEG Urine 5/05 >> E coli (pansens) Blood 5/05 >> E coli (pansens) Legionella Ag 5/06 >> NEG Strep Ag 5/06 >> NEG  ANTIMICROBIALS:  Azithro 5/06 >> 5/07 Ceftriaxone 5/06 >> 5/07 Levofloxacin 5/07 >> 5/08  Vanc 5/07 >> 5/08 Cefepime 5/07 >> 5/09 Cefazolin 5/07 >>    SUBJECTIVE:  RASS 0 to +1. + F/C. Poorly oriented. No overt distress   VITAL SIGNS: Temp:  [98 F (36.7 C)-101.5 F (38.6 C)] 101.5 F (38.6 C) (05/11 0758) Pulse Rate:  [84-116] 116 (05/11 0910) Resp:  [16-32] 26 (05/11 0910) BP: (88-139)/(44-126) 93/44 mmHg (05/11 0900) SpO2:  [79 %-100 %] 87 % (05/11 0910) FiO2 (%):  [60 %-100 %] 80 % (05/11 0317) Weight:  [76.9 kg (169 lb 8.5 oz)] 76.9 kg (169 lb 8.5 oz) (05/11  0420) HEMODYNAMICS:   VENTILATOR SETTINGS: Vent Mode:  [-] BIPAP FiO2 (%):  [60 %-100 %] 80 % Set Rate:  [15 bmp] 15 bmp INTAKE / OUTPUT: Intake/Output      05/10 0701 - 05/11 0700 05/11 0701 - 05/12 0700   I.V. (mL/kg) 264.5 (3.4) 20 (0.3)   Other     NG/GT 50    IV Piggyback 450 50   Total Intake(mL/kg) 764.5 (9.9) 70 (0.9)   Urine (mL/kg/hr) 3400 (1.8) 300 (0.9)   Total Output 3400 300   Net -2635.5 -230          PHYSICAL EXAMINATION: General: RASS 0, + F/C Neuro: PERRL. MAEs HEENT: NCAT Cardiovascular:  Reg, 3/6 systolic M of aortic origin, + S4 gallop Lungs: bronchial BS in LLL, R basilar crackles Abdomen:  Soft, non tender Ext: no edema  LABS:  CBC  Recent Labs Lab 11/01/14 0455 11/02/14 0431 11/03/14 0410  WBC 10.5 11.5* 14.9*  HGB 8.4* 7.7* 8.2*  HCT 25.2* 23.6* 25.1*  PLT 258 279 335   BMET  Recent Labs Lab 11/01/14 0455 11/02/14 0431 11/03/14 0410  NA 136 139 147*  K 3.6 3.7 4.0  CL 109 112* 112*  CO2 18* 22 26  BUN _0 CREATININE 0.90 0.79 0.87  GLUCOSE 101* 127* 126*   Electrolytes  Recent Labs Lab 10/31/14 0400 11/01/14 0455 11/02/14 0431 11/03/14 0410  CALCIUM 8.3* 8.4* 8.6* 9.2  MG 1.4* 2.0  2.1  --   PHOS 2.1* 3.6 4.2  --    Sepsis Markers  Recent Labs Lab 10/28/14 2315  10/31/14 0400 11/01/14 0455 11/03/14 0410  LATICACIDVEN 1.72  --   --   --   --   PROCALCITON  --   < > 9.17 5.03 1.94  < > = values in this interval not displayed. ABG  Recent Labs Lab 10/30/14 1321 10/31/14 0500 11/01/14 0442  PHART 7.346* 7.422 7.421  PCO2ART 32.0* 31.1* 28.7*  PO2ART 153.0* 87.5 96.0   Liver Enzymes  Recent Labs Lab 10/28/14 2310 10/29/14 0439 10/31/14 0400  AST _0 ALT 36 28 18  ALKPHOS 113 75 73  BILITOT 0.5 0.3 1.1  ALBUMIN 3.6 2.8* 1.9*    Glucose  Recent Labs Lab 11/02/14 1144 11/02/14 1521 11/02/14 1907 11/03/14 0006 11/03/14 0414 11/03/14 0749  GLUCAP 147* 127* 116* 105* 118* 116*     CXR: NSC to increased B AS dz   ASSESSMENT / PLAN: PULMONARY A: Acute hypoxic respiratory failure ARDS vs edema LLL Atx vs consolidation  Smoker P:   Monitor in ICU  Supp O2 to maintain SpO2 > 86% Cont PRN BiPAP Cont scheduled nebulized steroids and bronchodilators Lasix X 1 5/11  CARDIOVASCULAR A: Septic shock, resolved Mild to moderate aortic stenosis  Mild to mod mitral stenosis P:   Continue ASA Holding outpt lasix, verapamil Consider Cards input at some point (has seen Ganji in past) Recheck CXR 5/11  RENAL A: AKI, resolved Hypokalemia, resolved Hypomagnesemia, resolved Hypophosphatemia, resolved P:   Monitor BMET intermittently Monitor I/Os Correct electrolytes as indicated  GASTROINTESTINAL A: Post extubation dysphagia P:   SUP: IV famotidine NPO post extubation pending SLP eval today  HEMATOLOGIC A: Anemia of critical illness P:   DVT px: SQ LMWH Monitor CBC intermittently Transfuse per usual ICU guidelines  INFECTIOUS A: Severe sepsis E coli UTI E coli bacteremia LLL consolidation, r/o PNA Elevated PCT - improving P:   Micro and abx as above  ENDOCRINE A: No active issues P:   Monitor blood sugar on BMET Consider SSI for glu > 180  NEUROLOGIC A: Acute encephalopathy Hx of depression, anxiety on chronic high dose alprazolam  Anisocoria, resolved Incidental finding of 7 mm hyper attenuating lesion along the R septum pellucidum  Unclear significance P:   RASS goal: 0 Cont alprazolam @ reduced dose Holding outpt prozac, trazodone, roxicet MRI to elucidate CT findings when stable  Updated pts husband at bedside.  Merton Border, MD ; [facility] [phone].  After 5:30 PM or weekends, call [phone]

## 2014-11-03 NOTE — Evaluation (Signed)
Clinical/Bedside Swallow Evaluation Patient Details  Name: Holly Figueroa MRN: [id number] Date of Birth: 1965-10-13  Today's Date: 11/03/2014 Time: SLP Start Time (ACUTE ONLY): 59 SLP Stop Time (ACUTE ONLY): 1045 SLP Time Calculation (min) (ACUTE ONLY): 27 min  Past Medical History:  Past Medical History  Diagnosis Date  . Acute respiratory failure   . Hypokalemia   . CHF (congestive heart failure)   . Mitral stenosis   . Leukocytosis, unspecified   . Tobacco abuse   . Benzodiazepine dependence   . Legionella pneumonia   . Hypertension   . Cervical cancer   . Depression   . Anxiety   . Panic attacks   . Headache(784.0)     migraines  . Anemia 08-2008    Blood transfusion   Past Surgical History:  Past Surgical History  Procedure Laterality Date  . Laser ablation of the cervix    . Mole removal    . Nasal sinus surgery    . Cervical conization w/bx  2000  . 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 Cholecystectomy  06/26/2012    Procedure: LAPAROSCOPIC CHOLECYSTECTOMY WITH INTRAOPERATIVE CHOLANGIOGRAM;  Surgeon: Ralene Ok, MD;  Location: [facility];  Service: General;  Laterality: N/A;   HPI:  49 yo female from [facility] with PNA, ARDS, septic shock.  Intubated 5/7-5/10.  Has required high flow 02>BiPAP; agitated intermittently requiring Haldol.      Assessment / Plan / Recommendation Clinical Impression  Pt presents with a mild acute reversible dysphagia with minimal signs of dyscoordination of swallow/ventilation.  Sp02 hovering 89-91% during assessment, with ventimask removed intermittently to allow PO intake.  Pt declined regular solids, concerned she may "choke."  Per her preferences, recommend beginning a full liquid diet and advancing to regular as tolerated. D/W pt and husband biomechanics of swallowing and shared aerodigestive tract; the necessity of beginning POs cautiously.  Pt understands - will f/u x1 to ensure safety/basic precautions then  sign off.     Aspiration Risk  Mild    Diet Recommendation  (full liquid diet)   Medication Administration: Whole meds with liquid Compensations: Slow rate;Small sips/bites    Other  Recommendations Oral Care Recommendations: Oral care BID      Frequency and Duration    1 week            Swallow Study Prior Functional Status       General Date of Onset: 10/28/14 Other Pertinent Information: 49 yo female from [facility] with PNA, ARDS, septic shock.  Intubated 5/7-5/10.  Has required high flow 02>BiPAP; agitated intermittently requiring Haldol.    Type of Study: Bedside swallow evaluation Diet Prior to this Study: Regular Temperature Spikes Noted: Yes Respiratory Status: Supplemental O2 delivered via (comment) (venturi mask) History of Recent Intubation: Yes Length of Intubations (days): 3 days Date extubated: 11/02/14 Behavior/Cognition: Alert;Pleasant mood;Confused Oral Cavity - Dentition: Adequate natural dentition/normal for age Self-Feeding Abilities: Able to feed self Patient Positioning: Upright in bed Baseline Vocal Quality: Normal Volitional Cough: Strong Volitional Swallow: Able to elicit    Oral/Motor/Sensory Function Overall Oral Motor/Sensory Function: Appears within functional limits for tasks assessed   Ice Chips Ice chips: Within functional limits Presentation: Spoon   Thin Liquid Thin Liquid: Impaired Presentation: Cup;Straw (throat-clearing after <10% of swallows; eliminated with controlled bolus size) Pharyngeal  Phase Impairments: Suspected delayed Swallow;Throat Clearing - Delayed    Nectar Thick Nectar Thick Liquid: Not tested   Honey Thick Honey Thick Liquid:  Not tested   Puree Puree: Within functional limits   Solid  Holly Figueroa L. [place], [place] CCC/SLP Pager [phone]     Solid: Not tested (declined - pt concerned she will "choke")       Holly Figueroa 11/03/2014,10:52 AM

## 2014-11-03 NOTE — Progress Notes (Signed)
Placed pt back on Bipap due to SpO2 80 on 55 VM. Pt tolerating well at this time. RT will continue to monitor

## 2014-11-03 NOTE — Evaluation (Signed)
Physical Therapy Evaluation Patient Details Name: Holly Figueroa MRN: [id number] DOB: 1965/10/04 Today's Date: 11/03/2014   History of Present Illness  Pt is a 49 y/o woman with hx of mitral stenosis p/w pneumonia, GNR sepsis, developed into ARDS. Pt was intubated 5/7-5/10 and transferred to [facility] for further medical management.  Clinical Impression  Pt admitted with above diagnosis. Pt currently with functional limitations due to the deficits listed below (see PT Problem List). At the time of PT eval pt was able to perform transfers with +2 assist mainly for safety and management of lines. Anticipate that pt will progress well with therapy and will be appropriate for return home. Pt will benefit from skilled PT to increase their independence and safety with mobility to allow discharge to the venue listed below.       Follow Up Recommendations Home health PT;Supervision/Assistance - 24 hour    Equipment Recommendations  Other (comment) (TBD by progress with PT)    Recommendations for Other Services       Precautions / Restrictions Precautions Precautions: Fall Restrictions Weight Bearing Restrictions: No      Mobility  Bed Mobility Overal bed mobility: Needs Assistance Bed Mobility: Supine to Sit     Supine to sit: Min guard     General bed mobility comments: Pt was able to transition to EOB with hands-on guarding for trunk support.   Transfers Overall transfer level: Needs assistance Equipment used: 2 person hand held assist Transfers: Sit to/from [facility] Sit to Stand: Min assist Stand pivot transfers: Min assist       General transfer comment: Steadying assist for balance and support as pt powers up to full stand and takes pivotal steps around to the recliner.   Ambulation/Gait                Stairs            Wheelchair Mobility    Modified Rankin (Stroke Patients Only)       Balance Overall balance assessment: Needs  assistance Sitting-balance support: Feet supported;No upper extremity supported Sitting balance-Leahy Scale: Fair     Standing balance support: Bilateral upper extremity supported Standing balance-Leahy Scale: Poor                               Pertinent Vitals/Pain Pain Assessment: No/denies pain    Home Living Family/patient expects to be discharged to:: Private residence Living Arrangements: Spouse/significant other Available Help at Discharge: Family Type of Home: House Home Access: Stairs to enter   Technical brewer of Steps: 4 Home Layout: One level Home Equipment: Environmental consultant - 2 wheels;Shower seat      Prior Function Level of Independence: Independent         Comments: Pt does not work as she is on disability, however is active and works out 2-3 times a week.      Hand Dominance        Extremity/Trunk Assessment   Upper Extremity Assessment: Defer to OT evaluation           Lower Extremity Assessment: Overall WFL for tasks assessed (Hip flexors slightly weakened)      Cervical / Trunk Assessment: Normal  Communication   Communication: No difficulties  Cognition Arousal/Alertness: Lethargic;Suspect due to medications Behavior During Therapy: [facility] for tasks assessed/performed Overall Cognitive Status: Within Functional Limits for tasks assessed  General Comments      Exercises        Assessment/Plan    PT Assessment Patient needs continued PT services  PT Diagnosis Difficulty walking;Generalized weakness   PT Problem List Decreased strength;Decreased range of motion;Decreased activity tolerance;Decreased balance;Decreased mobility;Decreased knowledge of use of DME;Decreased safety awareness;Decreased knowledge of precautions;Cardiopulmonary status limiting activity  PT Treatment Interventions DME instruction;Gait training;Stair training;Functional mobility training;Therapeutic activities;Therapeutic  exercise;Neuromuscular re-education;Patient/family education   PT Goals (Current goals can be found in the Care Plan section) Acute Rehab PT Goals Patient Stated Goal: Breathe better PT Goal Formulation: With patient/family Time For Goal Achievement: 11/17/14 Potential to Achieve Goals: Good    Frequency Min 3X/week   Barriers to discharge        Co-evaluation               End of Session Equipment Utilized During Treatment: Gait belt;Oxygen Activity Tolerance: Patient tolerated treatment well Patient left: in chair;with call bell/phone within reach;with nursing/sitter in room;with family/visitor present Nurse Communication: Mobility status         Time: 0211-1552 PT Time Calculation (min) (ACUTE ONLY): 20 min   Charges:   PT Evaluation $Initial PT Evaluation Tier I: 1 Procedure     PT G Codes:        Rolinda Roan 11/14/14, 12:18 PM   Rolinda Roan, PT, DPT Acute Rehabilitation Services Pager: [phone]

## 2014-11-03 NOTE — Progress Notes (Signed)
Placed pt. On bipap due to pt. Desating.pt. Tolerating well at this time.

## 2014-11-04 ENCOUNTER — Inpatient Hospital Stay (HOSPITAL_COMMUNITY): Payer: Medicare FFS

## 2014-11-04 DIAGNOSIS — A499 Bacterial infection, unspecified: Secondary | ICD-10-CM

## 2014-11-04 LAB — BASIC METABOLIC PANEL
ANION GAP: 8 (ref 5–15)
BUN: 16 mg/dL (ref 6–20)
CALCIUM: 9 mg/dL (ref 8.9–10.3)
CHLORIDE: 107 mmol/L (ref 101–111)
CO2: 29 mmol/L (ref 22–32)
CREATININE: 0.89 mg/dL (ref 0.44–1.00)
GFR calc Af Amer: >60 mL/min (ref >60)
GFR calc non Af Amer: >60 mL/min (ref >60)
Glucose, Bld: 144 mg/dL — ABNORMAL HIGH (ref 65–99)
Potassium: 3.9 mmol/L (ref 3.5–5.1)
Sodium: 144 mmol/L (ref 135–145)

## 2014-11-04 LAB — CBC
HEMATOCRIT: 24.1 % — AB (ref 36.0–46.0)
Hemoglobin: 7.8 g/dL — ABNORMAL LOW (ref 12.0–15.0)
MCH: 30.4 pg (ref 26.0–34.0)
MCHC: 32.4 g/dL (ref 30.0–36.0)
MCV: 93.8 fL (ref 78.0–100.0)
PLATELETS: 415 10*3/uL — AB (ref 150–400)
RBC: 2.57 MIL/uL — AB (ref 3.87–5.11)
RDW: 13.7 % (ref 11.5–15.5)
WBC: 17 10*3/uL — ABNORMAL HIGH (ref 4.0–10.5)

## 2014-11-04 LAB — PROCALCITONIN: PROCALCITONIN: 1.35 ng/mL

## 2014-11-04 MED ORDER — ALPRAZOLAM 0.5 MG PO TABS
0.5000 mg | ORAL_TABLET | Freq: Three times a day (TID) | ORAL | Status: DC
  Administered 2014-11-04 – 2014-11-08 (×11): 0.5 mg via ORAL
  Filled 2014-11-04 (×11): qty 1

## 2014-11-04 MED ORDER — PANTOPRAZOLE SODIUM 40 MG PO TBEC
40.0000 mg | DELAYED_RELEASE_TABLET | Freq: Every day | ORAL | Status: DC
  Administered 2014-11-05 – 2014-11-08 (×4): 40 mg via ORAL
  Filled 2014-11-04 (×4): qty 1

## 2014-11-04 NOTE — Progress Notes (Signed)
NO distress noted, MD rounding, request pt trial off bipap this morning.  PT tol well, no distress noted.  Pt denies SOB presently. VSS. RN aware.

## 2014-11-04 NOTE — Progress Notes (Signed)
Nutrition Follow-up  DOCUMENTATION CODES:  Not applicable  INTERVENTION:   Boost Breeze po BID, each supplement provides 250 kcal and 9 grams of protein   NUTRITION DIAGNOSIS:  Inadequate oral intake related to other (see comment) (poor appetite) as evidenced by meal completion < 50%.  Ongoing.  GOAL:  Patient will meet greater than or equal to 90% of their needs  Unmet.  MONITOR:  PO intake, Supplement acceptance, Labs, Weight trends   ASSESSMENT:  Patient admitted from [facility] on 5/5 with PNA, ARDS, septic shock.  Extubated on 5/10. Diet advanced to heart healthy this AM, she requested full liquids yesterday and is still a little scared that she is going to choke on something. Reports poor appetite. Will try Resource Breeze supplement to maximize oral intake of kcals and protein.  Height:  Ht Readings from Last 1 Encounters:  10/30/14 _0  (1.676 m)    Weight:  Wt Readings from Last 1 Encounters:  11/03/14 169 lb 8.5 oz (76.9 kg)   11/01/14 179 lb 7.3 oz (81.4 kg)   10/30/14 171 lb 11.8 oz (77.9 kg)            Ideal Body Weight:  59.1 kg   BMI:  Body mass index is 27.38 kg/(m^2).  Estimated Nutritional Needs:  Kcal:  1800-2000  Protein:  90-100 gm  Fluid:  2 L  Skin:  Reviewed, no issues  Diet Order:  Diet heart healthy/carb modified Room service appropriate?: Yes; Fluid consistency:: Thin  EDUCATION NEEDS:  No education needs identified at this time   Intake/Output Summary (Last 24 hours) at 11/04/14 1224 Last data filed at 11/04/14 1200  Gross per 24 hour  Intake   1675 ml  Output   1240 ml  Net    435 ml    Last BM:  5/5   Molli Barrows, RD, LDN, [place] Pager [phone] After Hours Pager [phone]

## 2014-11-04 NOTE — Progress Notes (Signed)
PULMONARY / CRITICAL CARE MEDICINE HISTORY AND PHYSICAL EXAMINATION   Name: Holly Figueroa MRN: [id number] DOB: [birth year]    ADMISSION DATE:  10/28/2014  PRIMARY SERVICE: PCCM  CHIEF COMPLAINT:  Fever, chills, SOB  BRIEF PATIENT DESCRIPTION:  49 yo female from [facility] with PNA, ARDS, septic shock.  SIGNIFICANT EVENTS/STUDIES: 5/05 Admit to [facility] 5/07 To [facility], start pressors, nimbex 5/07 CT chest >> diffuse ASD, bibasilar consolidation, small b/l effusions 5/07 Echo:  mild LVH, EF 55 to 60%, mild/mod AS, mild/mod MS, mod LA dilation 5/09 Anisocoria (L pupil 7-8 mm, R 3-4). Nimbex DC'd. + F/C on WUA. STAT CT head ordered 5/09 CT head: No CT evidence for acute infarction. 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 MRI of the brain without and with contrast is recommended to further evaluate  5/10 Anisocoria resolved. Passed SBT with marginal gas exchange. Extubated. Required high flow O2 > BiPAP. Agitated at times. PRN Haldol. Off vasopressors 5/11 Less agitated and confused. Still required high flow O2 5/12 episodes of desaturations while asleep. Concern for OSA raised by RT. Pt reported that her sleep quality is much improved on BiPAP. Tolerating VM while awake.  5/12 Renal US:   INDWELLING DEVICES:: ETT 5/07 >> 5/10 L IJ CVL 5/07 >>  R radial A-line 5/07 >> 5/10  MICRO DATA: MRSA PCR 5/06 >> NEG Urine 5/05 >> E coli (pansens) Blood 5/05 >> E coli (pansens) Legionella Ag 5/06 >> NEG Strep Ag 5/06 >> NEG  ANTIMICROBIALS:  Azithro 5/06 >> 5/07 Ceftriaxone 5/06 >> 5/07 Levofloxacin 5/07 >> 5/08  Vanc 5/07 >> 5/08 Cefepime 5/07 >> 5/09 Cefazolin 5/07 >> 5/12   SUBJECTIVE:  episodes of desaturations while asleep. Concern for OSA raised by RT. Pt reported that her sleep quality is much improved on BiPAP. Tolerating VM while awake.   VITAL SIGNS: Temp:  [97.4 F (36.3 C)-100 F (37.8 C)] 98.7 F (37.1 C)  (05/12 1140) Pulse Rate:  [81-118] 82 (05/12 1500) Resp:  [16-35] 25 (05/12 1300) BP: (87-115)/(50-68) 96/61 mmHg (05/12 1400) SpO2:  [85 %-97 %] 86 % (05/12 1500) FiO2 (%):  [45 %-60 %] 45 % (05/12 1300) HEMODYNAMICS:   VENTILATOR SETTINGS: Vent Mode:  [-] BIPAP FiO2 (%):  [45 %-60 %] 45 % Set Rate:  [15 bmp] 15 bmp INTAKE / OUTPUT: Intake/Output      05/11 0701 - 05/12 0700 05/12 0701 - 05/13 0700   I.V. (mL/kg) 1249.2 (16.2) 300 (3.9)   NG/GT     IV Piggyback 250 50   Total Intake(mL/kg) 1499.2 (19.5) 350 (4.6)   Urine (mL/kg/hr) 2600 (1.4) 240 (0.4)   Total Output 2600 240   Net -1100.8 +110          PHYSICAL EXAMINATION: General: RASS 0, + F/C Neuro: PERRL. MAEs HEENT: NCAT Cardiovascular:  Reg, 3/6 systolic M of aortic origin, + S4 gallop Lungs: bibasilar crackles Abdomen:  Soft, non tender Ext: no edema  LABS:  CBC  Recent Labs Lab 11/02/14 0431 11/03/14 0410 11/04/14 0450  WBC 11.5* 14.9* 17.0*  HGB 7.7* 8.2* 7.8*  HCT 23.6* 25.1* 24.1*  PLT 279 335 415*   BMET  Recent Labs Lab 11/02/14 0431 11/03/14 0410 11/04/14 0450  NA 139 147* 144  K 3.7 4.0 3.9  CL 112* 112* 107  CO2 _0 BUN _1 CREATININE 0.79 0.87 0.89  GLUCOSE 127* 126* 144*   Electrolytes  Recent Labs Lab 10/31/14 0400 11/01/14 0455 11/02/14 0431 11/03/14 0410 11/04/14 0450  CALCIUM 8.3* 8.4* 8.6* 9.2 9.0  MG 1.4* 2.0 2.1  --   --   PHOS 2.1* 3.6 4.2  --   --    Sepsis Markers  Recent Labs Lab 10/28/14 2315  11/01/14 0455 11/03/14 0410 11/04/14 0450  LATICACIDVEN 1.72  --   --   --   --   PROCALCITON  --   < > 5.03 1.94 1.35  < > = values in this interval not displayed. ABG  Recent Labs Lab 10/30/14 1321 10/31/14 0500 11/01/14 0442  PHART 7.346* 7.422 7.421  PCO2ART 32.0* 31.1* 28.7*  PO2ART 153.0* 87.5 96.0   Liver Enzymes  Recent Labs Lab 10/28/14 2310 10/29/14 0439 10/31/14 0400  AST _0 ALT 36 28 18  ALKPHOS 113 75 73   BILITOT 0.5 0.3 1.1  ALBUMIN 3.6 2.8* 1.9*    Glucose  Recent Labs Lab 11/02/14 1521 11/02/14 1907 11/03/14 0006 11/03/14 0414 11/03/14 0749 11/03/14 1134  GLUCAP 127* 116* 105* 118* 116* 183*    CXR: [place] B AS dz   ASSESSMENT / PLAN: PULMONARY A: Acute hypoxic respiratory failure ARDS, doubt edema LLL Atx, doubt PNA  Smoker Suspected OSA P:   Supp O2 to maintain SpO2 > 86% Cont PRN BiPAP during day Initiate nocturnal BiPAP 5/12  Cont scheduled nebulized steroids and bronchodilators Recheck CXR 5/13  CARDIOVASCULAR A: Septic shock, resolved Mild to moderate aortic stenosis  Mild to mod mitral stenosis P:   Continue ASA Holding outpt lasix, verapamil  RENAL A: AKI, resolved Hypokalemia, resolved Hypomagnesemia, resolved Hypophosphatemia, resolved P:   Monitor BMET intermittently Monitor I/Os Correct electrolytes as indicated  GASTROINTESTINAL A: Post extubation dysphagia, improved P:   SUP: PO PPI Advance diet as tolerated  HEMATOLOGIC A: Anemia of critical illness P:   DVT px: SQ LMWH Monitor CBC intermittently Transfuse per usual guidelines  INFECTIOUS A: Severe sepsis, resolving E coli UTI with bacteremia LLL consolidation, doubt PNA Elevated PCT - improving P:   Micro and abx as above Renal US 5/12 Recheck PCT 5/13  ENDOCRINE A: No active issues P:   Monitor blood sugar on BMET Consider SSI for glu > 180  NEUROLOGIC A: Acute encephalopathy, improving Hx of depression, anxiety on chronic high dose alprazolam  Anisocoria, resolved Incidental finding of 7 mm hyper attenuating lesion along the R septum pellucidum  Unclear significance P:   RASS goal: 0 Cont alprazolam @ reduced dose Resume outpt fluoxetine, trazodone  Holding outpt roxicet Need to obtain MRI to elucidate CT findings when stable  Updated pts husband at bedside. Transfer to SDU. To remain on PCCM service  Merton Border, MD ; [facility]  [phone].  After 5:30 PM or weekends, call [phone]

## 2014-11-04 NOTE — Progress Notes (Signed)
Utilization review completed

## 2014-11-05 ENCOUNTER — Inpatient Hospital Stay (HOSPITAL_COMMUNITY): Payer: Medicare FFS

## 2014-11-05 LAB — CBC
HEMATOCRIT: 23.5 % — AB (ref 36.0–46.0)
HEMOGLOBIN: 7.6 g/dL — AB (ref 12.0–15.0)
MCH: 30.2 pg (ref 26.0–34.0)
MCHC: 32.3 g/dL (ref 30.0–36.0)
MCV: 93.3 fL (ref 78.0–100.0)
PLATELETS: 480 10*3/uL — AB (ref 150–400)
RBC: 2.52 MIL/uL — AB (ref 3.87–5.11)
RDW: 13.7 % (ref 11.5–15.5)
WBC: 15.4 10*3/uL — AB (ref 4.0–10.5)

## 2014-11-05 LAB — BASIC METABOLIC PANEL
ANION GAP: 12 (ref 5–15)
BUN: 19 mg/dL (ref 6–20)
CO2: 26 mmol/L (ref 22–32)
Calcium: 8.9 mg/dL (ref 8.9–10.3)
Chloride: 104 mmol/L (ref 101–111)
Creatinine, Ser: 0.77 mg/dL (ref 0.44–1.00)
GFR calc Af Amer: >60 mL/min (ref >60)
Glucose, Bld: 150 mg/dL — ABNORMAL HIGH (ref 65–99)
Potassium: 3.7 mmol/L (ref 3.5–5.1)
Sodium: 142 mmol/L (ref 135–145)

## 2014-11-05 LAB — PROCALCITONIN: Procalcitonin: 0.82 ng/mL

## 2014-11-05 MED ORDER — FLUOXETINE HCL 20 MG PO CAPS
40.0000 mg | ORAL_CAPSULE | Freq: Every day | ORAL | Status: DC
  Administered 2014-11-05 – 2014-11-08 (×4): 40 mg via ORAL
  Filled 2014-11-05 (×4): qty 2

## 2014-11-05 MED ORDER — TRAZODONE HCL 50 MG PO TABS
50.0000 mg | ORAL_TABLET | Freq: Every day | ORAL | Status: DC
  Administered 2014-11-05 – 2014-11-06 (×2): 50 mg via ORAL
  Filled 2014-11-05 (×4): qty 1

## 2014-11-05 MED ORDER — BUDESONIDE 0.5 MG/2ML IN SUSP
0.5000 mg | Freq: Two times a day (BID) | RESPIRATORY_TRACT | Status: DC
  Administered 2014-11-05 – 2014-11-08 (×7): 0.5 mg via RESPIRATORY_TRACT
  Filled 2014-11-05 (×9): qty 2

## 2014-11-05 MED ORDER — LORAZEPAM 2 MG/ML IJ SOLN
1.0000 mg | Freq: Once | INTRAMUSCULAR | Status: AC
  Administered 2014-11-06: 1 mg via INTRAVENOUS
  Filled 2014-11-05: qty 1

## 2014-11-05 NOTE — Progress Notes (Signed)
PULMONARY / CRITICAL CARE MEDICINE HISTORY AND PHYSICAL EXAMINATION   Name: Holly Figueroa MRN: [id number] DOB: Jun 27, 1965    ADMISSION DATE:  10/28/2014  PRIMARY SERVICE: PCCM  CHIEF COMPLAINT:  Fever, chills, SOB  BRIEF PATIENT DESCRIPTION:  49 yo female from [facility] with PNA, ARDS, septic shock.  SIGNIFICANT EVENTS/STUDIES: 5/05 Admit to [facility] 5/07 To [facility], start pressors, nimbex 5/07 CT chest >> diffuse ASD, bibasilar consolidation, small b/l effusions 5/07 Echo:  mild LVH, EF 55 to 60%, mild/mod AS, mild/mod MS, mod LA dilation 5/09 Anisocoria (L pupil 7-8 mm, R 3-4). Nimbex DC'd. + F/C on WUA. STAT CT head ordered 5/09 CT head: No CT evidence for acute infarction. 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 MRI of the brain without and with contrast is recommended to further evaluate  5/10 Anisocoria resolved. Passed SBT with marginal gas exchange. Extubated. Required high flow O2 > BiPAP. Agitated at times. PRN Haldol. Off vasopressors 5/11 Less agitated and confused. Still required high flow O2 5/12 episodes of desaturations while asleep. Concern for OSA raised by RT. Pt reported that her sleep quality is much improved on BiPAP. Tolerating VM while awake.  5/12 Renal US: normal 5/13 Transfer to SDU ordered  INDWELLING DEVICES:: ETT 5/07 >> 5/10 L IJ CVL 5/07 >> 5/12 R radial A-line 5/07 >> 5/10  MICRO DATA: MRSA PCR 5/06 >> NEG Urine 5/05 >> E coli (pansens) Blood 5/05 >> E coli (pansens) Legionella Ag 5/06 >> NEG Strep Ag 5/06 >> NEG  ANTIMICROBIALS:  Azithro 5/06 >> 5/07 Ceftriaxone 5/06 >> 5/07 Levofloxacin 5/07 >> 5/08  Vanc 5/07 >> 5/08 Cefepime 5/07 >> 5/09 Cefazolin 5/07 >> 5/12   SUBJECTIVE:  No new complaints. No distress. Still requiring 55% by VM.   VITAL SIGNS: Temp:  [97.4 F (36.3 C)-100.6 F (38.1 C)] 98.1 F (36.7 C) (05/13 1120) Pulse Rate:  [79-102] 99 (05/13 1100) Resp:   [25-27] 25 (05/13 0345) BP: (95-134)/(51-88) 104/54 mmHg (05/13 1000) SpO2:  [86 %-99 %] 92 % (05/13 1100) FiO2 (%):  [45 %-100 %] 55 % (05/13 0914) HEMODYNAMICS:   VENTILATOR SETTINGS: Vent Mode:  [-] BIPAP FiO2 (%):  [45 %-100 %] 55 % Set Rate:  [15 bmp] 15 bmp INTAKE / OUTPUT: Intake/Output      05/12 0701 - 05/13 0700 05/13 0701 - 05/14 0700   P.O.  600   I.V. (mL/kg) 380 (4.9)    IV Piggyback 100    Total Intake(mL/kg) 480 (6.2) 600 (7.8)   Urine (mL/kg/hr) 790 (0.4) 125 (0.2)   Total Output 790 125   Net -310 +475          PHYSICAL EXAMINATION: General: RASS 0, + F/C Neuro: PERRL. MAEs HEENT: NCAT Cardiovascular:  Reg, 3/6 systolic M of aortic origin Lungs: bibasilar crackles Abdomen:  Soft, non tender Ext: no edema  LABS:  CBC  Recent Labs Lab 11/03/14 0410 11/04/14 0450 11/05/14 0500  WBC 14.9* 17.0* 15.4*  HGB 8.2* 7.8* 7.6*  HCT 25.1* 24.1* 23.5*  PLT 335 415* 480*   BMET  Recent Labs Lab 11/03/14 0410 11/04/14 0450 11/05/14 0500  NA 147* 144 142  K 4.0 3.9 3.7  CL 112* 107 104  CO2 _0 BUN _1 CREATININE 0.87 0.89 0.77  GLUCOSE 126* 144* 150*   Electrolytes  Recent Labs Lab 10/31/14 0400 11/01/14 0455 11/02/14 0431 11/03/14 0410 11/04/14 0450 11/05/14 0500  CALCIUM 8.3* 8.4* 8.6* 9.2 9.0 8.9  MG 1.4* 2.0 2.1  --   --   --   PHOS 2.1* 3.6 4.2  --   --   --    Sepsis Markers  Recent Labs Lab 11/03/14 0410 11/04/14 0450 11/05/14 0500  PROCALCITON 1.94 1.35 0.82   ABG  Recent Labs Lab 10/30/14 1321 10/31/14 0500 11/01/14 0442  PHART 7.346* 7.422 7.421  PCO2ART 32.0* 31.1* 28.7*  PO2ART 153.0* 87.5 96.0   Liver Enzymes  Recent Labs Lab 10/31/14 0400  AST 24  ALT 18  ALKPHOS 73  BILITOT 1.1  ALBUMIN 1.9*    Glucose  Recent Labs Lab 11/02/14 1521 11/02/14 1907 11/03/14 0006 11/03/14 0414 11/03/14 0749 11/03/14 1134  GLUCAP 127* 116* 105* 118* 116* 183*    CXR: Persistent B AS dz,  now L>R   ASSESSMENT / PLAN: PULMONARY A: Acute hypoxic respiratory failure ALI/ARDS - slow radiographic resolution Smoker Suspected OSA P:   Cont supp O2 to maintain SpO2 > 88% ContBiPAP PRN during day Cont nocturnal BiPAP empirically for hx compatible with OSA  Cont scheduled nebulized steroids and bronchodilators  CARDIOVASCULAR A: Septic shock, resolved Mild to moderate aortic stenosis  Mild to mod mitral stenosis P:   Continue ASA Holding outpt lasix, verapamil  RENAL A: AKI, resolved Hypokalemia, resolved Hypomagnesemia, resolved Hypophosphatemia, resolved P:   Monitor BMET intermittently Monitor I/Os Correct electrolytes as indicated  GASTROINTESTINAL A: Post extubation dysphagia, improved Chronic PPI use P:   SUP: PO PPI Advancing diet as tolerated  HEMATOLOGIC A: Anemia of critical illness P:   DVT px: SQ LMWH Monitor CBC intermittently Transfuse per usual guidelines  INFECTIOUS A: Severe sepsis, resolving E coli UTI with bacteremia, treated X 7 days Persistent pulmonary infiltrates - doubt active PNA Elevated PCT - improving P:   Micro and abx as above Monitoring off abx   ENDOCRINE A: No active issues P:   Monitor blood sugar on BMET Consider SSI for glu > 180  NEUROLOGIC A: Acute encephalopathy, improving Hx of depression, anxiety on chronic high dose alprazolam  Anisocoria, resolved Incidental finding of 7 mm hyper attenuating lesion along the R septum pellucidum  Unclear significance P:   RASS goal: 0 Cont alprazolam @ reduced dose Resumed outpt fluoxetine, trazodone 5/12 Holding outpt roxicet Need to obtain MRI to elucidate CT findings when resp status stable  Updated pts husband at bedside. Transfer to SDU ordered 5/12. To remain on PCCM service  Merton Border, MD ; [facility] [phone].  After 5:30 PM or weekends, call [phone]

## 2014-11-05 NOTE — Progress Notes (Signed)
Pt received from 2MW alert/oriented. On venti-mask 55 % CHG bath done and oriented to room given Nurse call light able to use it asked pt not to get up without help. Husband at bedside

## 2014-11-05 NOTE — Progress Notes (Signed)
[place] Progress Note Patient Name: Holly Figueroa DOB: May 12, 1966 MRN: [id number]   Date of Service  11/05/2014  HPI/Events of Note  Anxious - taking of BiPAP.  HD stable with good O2 sats.  eICU Interventions  One time dose of ativan 1 mg IV Continue with xanax, prozac, desyrel     Intervention Category Intermediate Interventions: Other:  DETERDING,ELIZABETH 11/05/2014, 11:53 PM

## 2014-11-05 NOTE — Progress Notes (Signed)
Foley catheter removed per nurse protocol. Patient tolerated without complaints of discomfort. Patient will notify nurse when she needs to void.

## 2014-11-05 NOTE — Progress Notes (Signed)
Physical Therapy Treatment Patient Details Name: Holly Figueroa MRN: [id number] DOB: 04/30/1966 Today's Date: 11/05/2014    History of Present Illness Pt is a 49 y/o woman with hx of mitral stenosis p/w pneumonia, GNR sepsis, developed into ARDS. Pt was intubated 5/7-5/10 and transferred to [facility] for further medical management.    PT Comments    Pt progressing towards physical therapy goals. Was able to tolerate gait training without complaints this session. Pt on 55% O2 with the venturi mask, and sats remained 90-95% throughout session. Will continue to follow and progress as able per POC.   Follow Up Recommendations  Home health PT;Supervision/Assistance - 24 hour     Equipment Recommendations  Other (comment) (TBD by progress with PT)    Recommendations for Other Services       Precautions / Restrictions Precautions Precautions: Fall Restrictions Weight Bearing Restrictions: No    Mobility  Bed Mobility Overal bed mobility: Needs Assistance Bed Mobility: Supine to Sit     Supine to sit: Min guard     General bed mobility comments: Hands-on guarding required for trunk support as pt transitioned to EOB.   Transfers Overall transfer level: Needs assistance Equipment used: Rolling walker (2 wheeled) Transfers: Sit to/from Stand Sit to Stand: Min guard         General transfer comment: Pt was able to power-up to full standing position without physical assistance. Pt was cued for increased time prior to initiating ambulation so vitals could be monitored.  Ambulation/Gait Ambulation/Gait assistance: Min assist Ambulation Distance (Feet): 50 Feet Assistive device: Rolling walker (2 wheeled) Gait Pattern/deviations: Step-through pattern;Decreased stride length;Trunk flexed Gait velocity: Decreased Gait velocity interpretation: Below normal speed for age/gender General Gait Details: Occasional assist required for walker direction. VC's for pursed-lip breathing and  general safety awareness with the RW.   Stairs            Wheelchair Mobility    Modified Rankin (Stroke Patients Only)       Balance Overall balance assessment: Needs assistance Sitting-balance support: Feet supported;No upper extremity supported Sitting balance-Leahy Scale: Fair     Standing balance support: Bilateral upper extremity supported Standing balance-Leahy Scale: Poor Standing balance comment: Pt requires UE support to maintain standing balance.                     Cognition Arousal/Alertness: Awake/alert Behavior During Therapy: Restless Overall Cognitive Status: Impaired/Different from baseline Area of Impairment: Attention;Memory;Following commands;Safety/judgement;Problem solving   Current Attention Level: Selective Memory: Decreased short-term memory Following Commands: Follows one step commands consistently;Follows one step commands with increased time Safety/Judgement: Decreased awareness of safety;Decreased awareness of deficits   Problem Solving: Slow processing;Decreased initiation;Difficulty sequencing;Requires verbal cues General Comments: Pt required frequent cueing for safety awareness and to wait for therapist to get equipment ready before getting to EOB. Pt trying to climb over railings to get up.    Exercises General Exercises - Lower Extremity Ankle Circles/Pumps: 20 reps Quad Sets: 15 reps    General Comments General comments (skin integrity, edema, etc.): Pt required increased time for therapeutic exercise instruction. Had difficulty grasping concept of quad sets. Husband present to assist pt with exercise when therapist is not there.       Pertinent Vitals/Pain Pain Assessment: No/denies pain    Home Living                      Prior Function  PT Goals (current goals can now be found in the care plan section) Acute Rehab PT Goals Patient Stated Goal: Breathe better PT Goal Formulation: With  patient/family Time For Goal Achievement: 11/17/14 Potential to Achieve Goals: Good Progress towards PT goals: Progressing toward goals    Frequency  Min 3X/week    PT Plan Current plan remains appropriate    Co-evaluation             End of Session Equipment Utilized During Treatment: Gait belt;Oxygen Activity Tolerance: Patient tolerated treatment well Patient left: in chair;with call bell/phone within reach;with family/visitor present     Time: 4196-2229 PT Time Calculation (min) (ACUTE ONLY): 34 min  Charges:  $Gait Training: 8-22 mins $Therapeutic Activity: 8-22 mins                    G Codes:      Rolinda Roan 11-08-14, 12:03 PM   Rolinda Roan, PT, DPT Acute Rehabilitation Services Pager: [phone]

## 2014-11-05 NOTE — Progress Notes (Signed)
Report called to receiving nurse Latricia Heft, RN) [phone]. Past medical history and present hospitalization reported. All questions answered. Husband at bedside and aware of move to 2C02. All belongings transported with patient.

## 2014-11-06 MED ORDER — HALOPERIDOL LACTATE 5 MG/ML IJ SOLN
5.0000 mg | Freq: Once | INTRAMUSCULAR | Status: AC
  Administered 2014-11-06: 5 mg via INTRAVENOUS

## 2014-11-06 MED ORDER — HALOPERIDOL LACTATE 5 MG/ML IJ SOLN
10.0000 mg | Freq: Once | INTRAMUSCULAR | Status: AC
  Administered 2014-11-06: 10 mg via INTRAVENOUS
  Filled 2014-11-06: qty 2

## 2014-11-06 MED ORDER — HALOPERIDOL LACTATE 5 MG/ML IJ SOLN: 10.0000 mg | Freq: Once | INTRAMUSCULAR | Status: DC

## 2014-11-06 MED ORDER — HALOPERIDOL LACTATE 5 MG/ML IJ SOLN
INTRAMUSCULAR | Status: AC
  Filled 2014-11-06: qty 1
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
## 2014-11-06 NOTE — Progress Notes (Signed)
   11/06/14 0152  Vent Select  Invasive or Noninvasive Noninvasive  Adult Vent Y  Adult Ventilator Settings  Vent Type Servo i  Vent Mode BIPAP  Vt Set 563 mL  Set Rate 15 bmp  FiO2 (%) 60 %  IPAP 12 cmH20  EPAP 5 cmH20  Adult Ventilator Measurements  Peak Airway Pressure 16 L/min  Mean Airway Pressure 8 cmH20  Resp Rate Spontaneous 13 br/min  Resp Rate Total 28 br/min  Exhaled Vt 563 mL  Measured Ve 14.7 mL  HOB> 30 Degrees Y  Adult Ventilator Alarms  Alarms On Y  Ve High Alarm 25 L/min  Ve Low Alarm 5 L/min  Resp Rate High Alarm 35 br/min  Resp Rate Low Alarm 10  Patient is placed back on NIV BIPAP. She tolerates it very well at this time.

## 2014-11-06 NOTE — Progress Notes (Signed)
PULMONARY / CRITICAL CARE MEDICINE HISTORY AND PHYSICAL EXAMINATION   Name: Holly Figueroa MRN: [id number] DOB: 24-Sep-1965    ADMISSION DATE:  10/28/2014  PRIMARY SERVICE: PCCM  CHIEF COMPLAINT:  Fever, chills, SOB  BRIEF PATIENT DESCRIPTION:  49 yo female from [facility] with PNA, ARDS, septic shock.  SIGNIFICANT EVENTS/STUDIES: 5/05 Admit to [facility] 5/07 To [facility], start pressors, nimbex 5/07 CT chest >> diffuse ASD, bibasilar consolidation, small b/l effusions 5/07 Echo:  mild LVH, EF 55 to 60%, mild/mod AS, mild/mod MS, mod LA dilation 5/09 Anisocoria (L pupil 7-8 mm, R 3-4). Nimbex DC'd. + F/C on WUA. STAT CT head ordered 5/09 CT head: No CT evidence for acute infarction. 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 MRI of the brain without and with contrast is recommended to further evaluate  5/10 Anisocoria resolved. Passed SBT with marginal gas exchange. Extubated. Required high flow O2 > BiPAP. Agitated at times. PRN Haldol. Off vasopressors 5/11 Less agitated and confused. Still required high flow O2 5/12 episodes of desaturations while asleep. Concern for OSA raised by RT. Pt reported that her sleep quality is much improved on BiPAP. Tolerating VM while awake.  5/12 Renal US: normal 5/13 Transfer to SDU ordered  INDWELLING DEVICES:: ETT 5/07 >> 5/10 L IJ CVL 5/07 >> 5/12 R radial A-line 5/07 >> 5/10  MICRO DATA: MRSA PCR 5/06 >> NEG Urine 5/05 >> E coli (pansens) Blood 5/05 >> E coli (pansens) Legionella Ag 5/06 >> NEG Strep Ag 5/06 >> NEG  ANTIMICROBIALS:  Azithro 5/06 >> 5/07 Ceftriaxone 5/06 >> 5/07 Levofloxacin 5/07 >> 5/08  Vanc 5/07 >> 5/08 Cefepime 5/07 >> 5/09 Cefazolin 5/07 >> 5/12   SUBJECTIVE:  Stable overnight, no increased wob.  Excellent sats on venti mask, can probably go to [place].  Low grade fever overnight, but pt tells me she feels well. Making urine well.   VITAL SIGNS: Temp:  [98.1 F  (36.7 C)-100.3 F (37.9 C)] 100.3 F (37.9 C) (05/14 0831) Pulse Rate:  [73-99] 73 (05/14 0831) Resp:  [20-29] 25 (05/14 0831) BP: (87-139)/(43-81) 87/43 mmHg (05/14 0831) SpO2:  [91 %-99 %] 95 % (05/14 0831) FiO2 (%):  [55 %-60 %] 60 % (05/14 0732) Weight:  [75.6 kg (166 lb 10.7 oz)-76.295 kg (168 lb 3.2 oz)] 75.6 kg (166 lb 10.7 oz) (05/14 0414) HEMODYNAMICS:   VENTILATOR SETTINGS: Vent Mode:  [-] BIPAP FiO2 (%):  [55 %-60 %] 60 % Set Rate:  [15 bmp] 15 bmp Vt Set:  [563 mL] 563 mL INTAKE / OUTPUT: Intake/Output      05/13 0701 - 05/14 0700 05/14 0701 - 05/15 0700   P.O. 1420    I.V. (mL/kg)     IV Piggyback     Total Intake(mL/kg) 1420 (18.8)    Urine (mL/kg/hr) 625 (0.3)    Total Output 625     Net +795          Urine Occurrence 1 x 1 x   Stool Occurrence  1 x     PHYSICAL EXAMINATION: General: wd female in nad Neuro: alert, moves all 4 HEENT: nose without purulence or d/c, neck with LN or TMG Cardiovascular:  RRR, 2/6 sem Lungs: crackles in bases, no wheezing Abdomen:  Soft, non tender, bs+ Ext: no edema, no cyanosis  LABS:  CBC  Recent Labs Lab 11/03/14 0410 11/04/14 0450 11/05/14 0500  WBC 14.9* 17.0* 15.4*  HGB 8.2* 7.8* 7.6*  HCT 25.1* 24.1* 23.5*  PLT 335 415* 480*   BMET  Recent Labs Lab 11/03/14 0410 11/04/14 0450 11/05/14 0500  NA 147* 144 142  K 4.0 3.9 3.7  CL 112* 107 104  CO2 _0 BUN _1 CREATININE 0.87 0.89 0.77  GLUCOSE 126* 144* 150*   Electrolytes  Recent Labs Lab 10/31/14 0400 11/01/14 0455 11/02/14 0431 11/03/14 0410 11/04/14 0450 11/05/14 0500  CALCIUM 8.3* 8.4* 8.6* 9.2 9.0 8.9  MG 1.4* 2.0 2.1  --   --   --   PHOS 2.1* 3.6 4.2  --   --   --    Sepsis Markers  Recent Labs Lab 11/03/14 0410 11/04/14 0450 11/05/14 0500  PROCALCITON 1.94 1.35 0.82   ABG  Recent Labs Lab 10/30/14 1321 10/31/14 0500 11/01/14 0442  PHART 7.346* 7.422 7.421  PCO2ART 32.0* 31.1* 28.7*  PO2ART 153.0* 87.5  96.0   Liver Enzymes  Recent Labs Lab 10/31/14 0400  AST 24  ALT 18  ALKPHOS 73  BILITOT 1.1  ALBUMIN 1.9*    Glucose  Recent Labs Lab 11/02/14 1521 11/02/14 1907 11/03/14 0006 11/03/14 0414 11/03/14 0749 11/03/14 1134  GLUCAP 127* 116* 105* 118* 116* 183*       ASSESSMENT / PLAN: PULMONARY A: Acute hypoxic respiratory failure ALI/ARDS - slow radiographic resolution Smoker Suspected OSA P:   Cont supp O2 to maintain SpO2 > 88%.  Try to wean to [place] ContBiPAP PRN  Cont nocturnal BiPAP empirically for hx compatible with OSA  Cont scheduled nebulized steroids and bronchodilators   HEMATOLOGIC A: Anemia of critical illness P:   DVT px: SQ LMWH Monitor CBC intermittently Transfuse per usual guidelines  INFECTIOUS A: Severe sepsis, resolving E coli UTI with bacteremia, treated X 7 days Persistent pulmonary infiltrates - doubt active PNA Elevated PCT - improving P:   Micro and abx as above Monitoring off abx   ENDOCRINE A: No active issues P:   Monitor blood sugar on BMET Consider SSI for glu > 180  NEUROLOGIC A: Acute encephalopathy, improving Hx of depression, anxiety on chronic high dose alprazolam  Anisocoria, resolved Incidental finding of 7 mm hyper attenuating lesion along the R septum pellucidum  Unclear significance P:   Cont alprazolam @ reduced dose Resumed outpt fluoxetine, trazodone 5/12 Holding outpt roxicet Need to obtain MRI to elucidate CT findings when resp status stable

## 2014-11-06 NOTE — Progress Notes (Signed)
[place] Progress Note Patient Name: Holly Figueroa DOB: April 03, 1966 MRN: [id number]   Date of Service  11/06/2014  HPI/Events of Note  Patient agitated.  Will not keep BiPAP in place which drops sats.  Did not respond to 1 mg ativan.  eICU Interventions  Plan: 5 mg haldol IV times one now. Nurse to contact [facility] MD in 30 minutes if patient remains agitated.     Intervention Category Major Interventions: Delirium, psychosis, severe agitation - evaluation and management  Salahuddin Arismendez 11/06/2014, 12:55 AM

## 2014-11-06 NOTE — Progress Notes (Signed)
Pt still trying to climb out of bed and is removing her bipapp. PCCM notified. An order for 41m haldol was given. Haldol administered at [place]

## 2014-11-07 DIAGNOSIS — G4733 Obstructive sleep apnea (adult) (pediatric): Secondary | ICD-10-CM

## 2014-11-07 LAB — CBC
HEMATOCRIT: 21.7 % — AB (ref 36.0–46.0)
HEMOGLOBIN: 7.1 g/dL — AB (ref 12.0–15.0)
MCH: 30.3 pg (ref 26.0–34.0)
MCHC: 32.7 g/dL (ref 30.0–36.0)
MCV: 92.7 fL (ref 78.0–100.0)
Platelets: 511 10*3/uL — ABNORMAL HIGH (ref 150–400)
RBC: 2.34 MIL/uL — ABNORMAL LOW (ref 3.87–5.11)
RDW: 13.9 % (ref 11.5–15.5)
WBC: 14.3 10*3/uL — ABNORMAL HIGH (ref 4.0–10.5)

## 2014-11-07 LAB — BASIC METABOLIC PANEL
Anion gap: 12 (ref 5–15)
BUN: 16 mg/dL (ref 6–20)
CO2: 24 mmol/L (ref 22–32)
Calcium: 8.6 mg/dL — ABNORMAL LOW (ref 8.9–10.3)
Chloride: 104 mmol/L (ref 101–111)
Creatinine, Ser: 0.7 mg/dL (ref 0.44–1.00)
GFR calc Af Amer: >60 mL/min (ref >60)
Glucose, Bld: 112 mg/dL — ABNORMAL HIGH (ref 65–99)
Potassium: 3.1 mmol/L — ABNORMAL LOW (ref 3.5–5.1)
Sodium: 140 mmol/L (ref 135–145)

## 2014-11-07 MED ORDER — POTASSIUM CHLORIDE CRYS ER 20 MEQ PO TBCR
40.0000 meq | EXTENDED_RELEASE_TABLET | ORAL | Status: AC
  Administered 2014-11-07 (×2): 40 meq via ORAL
  Filled 2014-11-07 (×2): qty 2

## 2014-11-07 NOTE — Progress Notes (Signed)
PULMONARY / CRITICAL CARE MEDICINE HISTORY AND PHYSICAL EXAMINATION   Name: Holly Figueroa MRN: [id number] DOB: 12-May-1966    ADMISSION DATE:  10/28/2014  PRIMARY SERVICE: PCCM  CHIEF COMPLAINT:  Fever, chills, SOB  BRIEF PATIENT DESCRIPTION:  49 yo female from [facility] with PNA, ARDS, septic shock.  SIGNIFICANT EVENTS/STUDIES: 5/05 Admit to [facility] 5/07 To [facility], start pressors, nimbex 5/07 CT chest >> diffuse ASD, bibasilar consolidation, small b/l effusions 5/07 Echo:  mild LVH, EF 55 to 60%, mild/mod AS, mild/mod MS, mod LA dilation 5/09 Anisocoria (L pupil 7-8 mm, R 3-4). Nimbex DC'd. + F/C on WUA. STAT CT head ordered 5/09 CT head: No CT evidence for acute infarction. 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 MRI of the brain without and with contrast is recommended to further evaluate  5/10 Anisocoria resolved. Passed SBT with marginal gas exchange. Extubated. Required high flow O2 > BiPAP. Agitated at times. PRN Haldol. Off vasopressors 5/11 Less agitated and confused. Still required high flow O2 5/12 episodes of desaturations while asleep. Concern for OSA raised by RT. Pt reported that her sleep quality is much improved on BiPAP. Tolerating VM while awake.  5/12 Renal US: normal 5/13 Transfer to SDU ordered  INDWELLING DEVICES:: ETT 5/07 >> 5/10 L IJ CVL 5/07 >> 5/12 R radial A-line 5/07 >> 5/10  MICRO DATA: MRSA PCR 5/06 >> NEG Urine 5/05 >> E coli (pansens) Blood 5/05 >> E coli (pansens) Legionella Ag 5/06 >> NEG Strep Ag 5/06 >> NEG  ANTIMICROBIALS:  Azithro 5/06 >> 5/07 Ceftriaxone 5/06 >> 5/07 Levofloxacin 5/07 >> 5/08  Vanc 5/07 >> 5/08 Cefepime 5/07 >> 5/09 Cefazolin 5/07 >> 5/12   SUBJECTIVE:  Good night, no increased sob or new issues.  Wearing bipap for presumed osa, on nasal oxygen this am with excellent sats  VITAL SIGNS: Temp:  [97.5 F (36.4 C)-98.6 F (37 C)] 98 F (36.7 C) (05/15  0746) Pulse Rate:  [70-84] 70 (05/15 0746) Resp:  [18-30] 25 (05/15 0746) BP: (91-115)/(35-55) 91/40 mmHg (05/15 0746) SpO2:  [90 %-98 %] 92 % (05/15 0746) FiO2 (%):  [40 %] 40 % (05/15 0413) HEMODYNAMICS:   VENTILATOR SETTINGS: Vent Mode:  [-]  FiO2 (%):  [40 %] 40 % INTAKE / OUTPUT: Intake/Output      05/14 0701 - 05/15 0700 05/15 0701 - 05/16 0700   P.O. 960 240   Total Intake(mL/kg) 960 (12.7) 240 (3.2)   Urine (mL/kg/hr) 500 (0.3) 300 (0.9)   Stool 0 (0) 0 (0)   Total Output 500 300   Net +460 -60        Urine Occurrence 3 x 1 x   Stool Occurrence 1 x 2 x     PHYSICAL EXAMINATION: General: wd female in nad Neuro: alert, moves all 4 HEENT: nose without purulence or d/c, neck with LN or TMG Cardiovascular:  RRR, 2/6 sem Lungs: minimal crackles in bases, no wheezing Abdomen:  Soft, non tender, bs+ Ext: no edema, no cyanosis  LABS:  CBC  Recent Labs Lab 11/04/14 0450 11/05/14 0500 11/07/14 0400  WBC 17.0* 15.4* 14.3*  HGB 7.8* 7.6* 7.1*  HCT 24.1* 23.5* 21.7*  PLT 415* 480* 511*   BMET  Recent Labs Lab 11/04/14 0450 11/05/14 0500 11/07/14 0400  NA 144 142 140  K 3.9 3.7 3.1*  CL 107 104 104  CO2 _0 BUN _1 CREATININE 0.89  0.77 0.70  GLUCOSE 144* 150* 112*   Electrolytes  Recent Labs Lab 11/01/14 0455 11/02/14 0431  11/04/14 0450 11/05/14 0500 11/07/14 0400  CALCIUM 8.4* 8.6*  < > 9.0 8.9 8.6*  MG 2.0 2.1  --   --   --   --   PHOS 3.6 4.2  --   --   --   --   < > = values in this interval not displayed. Sepsis Markers  Recent Labs Lab 11/03/14 0410 11/04/14 0450 11/05/14 0500  PROCALCITON 1.94 1.35 0.82   ABG  Recent Labs Lab 11/01/14 0442  PHART 7.421  PCO2ART 28.7*  PO2ART 96.0   Liver Enzymes No results for input(s): AST, ALT, ALKPHOS, BILITOT, ALBUMIN in the last 168 hours.  Glucose  Recent Labs Lab 11/02/14 1521 11/02/14 1907 11/03/14 0006 11/03/14 0414 11/03/14 0749 11/03/14 1134  GLUCAP 127*  116* 105* 118* 116* 183*       ASSESSMENT / PLAN: PULMONARY A: Acute hypoxic respiratory failure ALI/ARDS - slow radiographic resolution Smoker Suspected OSA P:   Cont supp O2 to maintain SpO2 > 88%.   ContBiPAP PRN  Cont nocturnal BiPAP empirically for hx compatible with OSA  Cont scheduled nebulized steroids and bronchodilators   HEMATOLOGIC A: Anemia of critical illness P:   DVT px: SQ LMWH Monitor CBC intermittently >> check in am with recent 7.1 Transfuse per usual guidelines   NEUROLOGIC A: Acute encephalopathy, improving Hx of depression, anxiety on chronic high dose alprazolam  Anisocoria, resolved Incidental finding of 7 mm hyper attenuating lesion along the R septum pellucidum  Unclear significance P:   Cont alprazolam @ reduced dose Resumed outpt fluoxetine, trazodone 5/12 Holding outpt roxicet Need to obtain MRI to elucidate CT findings when resp status stable

## 2014-11-07 NOTE — Progress Notes (Signed)
[place] Progress Note Patient Name: Risha Barretta Valko DOB: 07-08-1965 MRN: [id number]   Date of Service  11/07/2014  HPI/Events of Note  Hypokalemia  eICU Interventions  Potassium replaced     Intervention Category Intermediate Interventions: Electrolyte abnormality - evaluation and management  Latayna Ritchie 11/07/2014, 5:34 AM

## 2014-11-07 NOTE — Progress Notes (Signed)
Holly Figueroa tolerated oxygen at 2L [place] until she went to sleep.  Immediately she began to desaturate into the 80"s with a rapid rise upon awakening.  Respiratory called and she was placed on a bipap.  She has been on it for several hours with O2 levels in the upper 90's and sleeping soundly.  No confusion noted as yet.

## 2014-11-08 ENCOUNTER — Inpatient Hospital Stay (HOSPITAL_COMMUNITY): Payer: Medicare FFS

## 2014-11-08 LAB — CBC
HCT: 22.6 % — ABNORMAL LOW (ref 36.0–46.0)
Hemoglobin: 7.4 g/dL — ABNORMAL LOW (ref 12.0–15.0)
MCH: 30.5 pg (ref 26.0–34.0)
MCHC: 32.7 g/dL (ref 30.0–36.0)
MCV: 93 fL (ref 78.0–100.0)
Platelets: 574 10*3/uL — ABNORMAL HIGH (ref 150–400)
RBC: 2.43 MIL/uL — ABNORMAL LOW (ref 3.87–5.11)
RDW: 14 % (ref 11.5–15.5)
WBC: 15.4 10*3/uL — ABNORMAL HIGH (ref 4.0–10.5)

## 2014-11-08 MED ORDER — ALPRAZOLAM 1 MG PO TABS: 0.5000 mg | ORAL_TABLET | Freq: Three times a day (TID) | ORAL | Status: AC

## 2014-11-08 NOTE — Progress Notes (Signed)
Has made dramatic progress over WE Comfortable on RA Able to ambulate without distress but a little unsteady on feet She is ready for discharge A discharge CXR has been ordered  Her DC diagnoses are as follows:  Septic shock/severe sepsis, resolved E coli UTI with bacteremia, resolved Acute hypoxic respiratory failure, resolved ALI/ARDS, resolved AKI, resolved Hypokalemia, resolved Hypomagnesemia, resolved Hypophosphatemia, resolved Post extubation dysphagia, resolved Acute encephalopathy, resolved Anisocoria, resolved Mild to moderate aortic stenosis   Routine F/U with Dr Einar Gip as previously scheduled Mild to mod mitral stenosis  Routine F/U with Dr Einar Gip as previously scheduled Chronic PPI use - unclear indication  Cont previous dose of omeprazole Anemia of critical illness  Should have repeat CBC in 3-4 wks Smoker  Counseled re: need for cessation Suspected OSA by hx provided by husband  Needs outpt sleep study Hx of depression  Cont fluoxetine and trazodone at previous doses Chronic anxiety on high dose alprazolam   Discharge dose o alprazolam (0.5 mg PO TID) is reduced from previous Incidental finding of 7 mm hyper attenuating lesion along the R septum pellucidum by CT head of unclear significance  Needs MRI as outpt in followup per Neurology recs Deconditioning  Have discussed with Care Mgmt. Will arrange home PT  She should followup with [facility] pulmonary medicine  A discharge summary should be sent to Dr Einar Gip and to [facility] ([facility])   Merton Border, MD ; [facility] service [place] [phone].  After 5:30 PM or weekends, call [phone]

## 2014-11-08 NOTE — Care Management Note (Signed)
Case Management Note  Patient Details  Name: Holly Figueroa MRN: [id number] Date of Birth: Nov 01, 1965  Subjective/Objective:                    Action/Plan:   Expected Discharge Date:  11/02/14               Expected Discharge Plan:     In-House Referral:  NA  Discharge planning Services  CM Consult  Post Acute Care Choice:  NA Choice offered to:     DME Arranged:  Oxygen DME Agency:  [place]  HH Arranged:    [place] Agency:     Status of Service:  Completed, signed off  Medicare Important Message Given:  Yes Date Medicare IM Given:  10/29/14 Medicare IM give by:  Jolene Provost, RN, MSN, CM Date Additional Medicare IM Given:  11/08/14 Additional Medicare Important Message give by:  Jonnie Finner RN   If discussed at [place] of Stay Meetings, dates discussed:    Additional Comments: NCM spoke to pt and refusing HH PT at this time. [place] to arrange sleep study. Appt is December 02, 2014. They will mail out packet for pt to review and what is needed for sleep study. Faxed referral to [facility].   Erenest Rasher, RN 11/08/2014, 12:08 PM

## 2014-11-08 NOTE — Progress Notes (Signed)
Pt tolerated 4L [place] with saturation at 92-98% all night. Pt had a couple episodes of desaturation, but would rapidly rise upon awakening or after coughing. Pt did not need to be placed on BiPAP.   Pt refused scheduled trazodone 19m stating it, "makes me too sleepy".    MHart Rochester RN, BSN

## 2014-11-08 NOTE — Discharge Summary (Signed)
Physician Discharge Summary  Patient ID: Holly Figueroa MRN: [id number] DOB/AGE: 07/31/1965 49 y.o.  Admit date: 10/28/2014 Discharge date: 11/08/2014    Discharge Diagnoses:  Septic shock/severe sepsis, resolved E coli UTI with bacteremia, resolved Acute hypoxic respiratory failure, resolved ALI/ARDS, resolved AKI, resolved Hypokalemia, resolved Hypomagnesemia, resolved Hypophosphatemia, resolved Post extubation dysphagia, resolved Acute encephalopathy, resolved Anisocoria, resolved Mild to moderate aortic stenosis  Mild to mod mitral stenosis Chronic PPI use - unclear indication Anemia of critical illness Smoker Suspected OSA by hx provided by husband Hx of depression Chronic anxiety on high dose alprazolam  Incidental finding of 7 mm hyper attenuating lesion along the R septum pellucidum by CT head of unclear significance Deconditioning                                                                          DISCHARGE PLAN BY DIAGNOSIS       Septic shock/severe sepsis, resolved E coli UTI with bacteremia, resolved   Acute hypoxic respiratory failure, resolved ALI/ARDS, resolved Smoker Suspected OSA  Discharge Plan: Counseled regarding smoking cessation need  Outpt sleep study arranged at [facility]   AKI, resolved Hypokalemia, resolved Hypomagnesemia, resolved Hypophosphatemia, resolved  Discharge Plan: Follow up BMP in 3-4 weeks with PCP Dewaine Conger, NP)  Post extubation dysphagia, resolved Chronic PPI use - unclear indication  Discharge Plan: Continue omeprazole  Acute encephalopathy, resolved Anisocoria, resolved   Mild to moderate aortic stenosis  Mild to mod mitral stenosis Normotension   Discharge Plan: Routine F/U with Dr Einar Gip as previously scheduled (planned for next week) Hold verapamil and lasix until seen by Dr. Nadyne Coombes   Anemia of critical illness  Discharge Plan: Repeat  CBC in 3-4 wks with PCP   Hx of depression Chronic anxiety on high dose alprazolam   Discharge Plan: Reduce Alprazolam to 0.5 mg PO TID Cont fluoxetine and trazodone at previous doses  Incidental finding of 7 mm hyper attenuating lesion along the R septum pellucidum by CT head of unclear significance  Discharge Plan: Will need an MRI as outpt in followup per Neurology recs  Deconditioning Discharge Plan: Home PT arranged for discharge                    DISCHARGE SUMMARY   Holly Figueroa is a 49 y.o. y/o female, smoker (1ppd),  with a PMH of rheumatic heart disease, anxiety/depression, HTN, migraines, hypokalemia, mitral stenosis s/p valve repair in 2010 and cervical cancer who presented to [facility] on 10/28/14 with fever and chest discomfort.    On admit, the patient reported approximately 24 hours of rigors, chills, nausea, cough without sputum production and shortness of breath.  ER evaluation noted her to be hypoxic with saturations in the 80's and a chest xray consistent with multi-focal bilateral pneumonia.  She was admitted per [facility] to the ICU at [facility] and placed on BiPAP support.  She had progressive SOB and hypoxemia and required intubation and subsequent transfer to [facility] for further care.    She was admitted to ICU, maintained on mechanical ventilation, pan cultured and treated for PNA.  Due to ventilator dysynchrony, she required sedation and IV paralytics.  She developed  septic shock in the setting of GNR bacteremia (+ UTI/ CAP).  CT of the chest was assessed which showed diffuse airspace disease, bibasilar consolidation and small bilateral pleural effusions.  ECHO was notable for mild LVH, EF 55-60%.  Neuro exam on 5/7 noted anisocoria and Nimbex was discontinued.  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  She required vasopressors to maintain normotension. ICU course was complicated by acute kidney injury (negative renal ultrasound), electrolyte disturbances and anemia of critical illness.  Cultures were positive for E-Coli UTI and sputum which was pan sensitive.  She was treated with IV antibiotics as below.  The patient was subsequently weaned off paralytics on 5/9 and mechanical ventilation on 5/10 without further respiratory distress.  She had noted nocturnal desaturations during admit and was placed on BiPAP QHS which she reported significant improvement in sleep quality.    The patients blood pressure prior to discharge was within normal limits but not hypertensive.  Home medications were held due to normotension and can be reviewed at time of follow up.  She was assessed by PT and recommended for home physical therapy at time of discharge.  Smoking cessation was encouraged.  Oxygen was weaned to off.  Ambulatory desaturations were assessed prior to discharge and she maintained 88% or greater with activity (averaged 92-96% with walking).  The patient was medically cleared for discharge with plans as above.                SIGNIFICANT EVENTS / STUDIES: 5/05  Admit to [facility] 5/07  To [facility], start pressors, nimbex 5/07  CT chest >> diffuse ASD, bibasilar consolidation, small b/l effusions 5/07  ECHO >>  mild LVH, EF 55 to 60%, mild/mod AS, mild/mod MS, mod LA dilation 5/09  Anisocoria (L pupil 7-8 mm, R 3-4). Nimbex DC'd. + F/C on [facility]. STAT CT head ordered 5/09  CT head >> No CT evidence for acute infarction. 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 MRI of the brain without and with contrast is recommended to further evaluate  5/10  Anisocoria resolved. Passed SBT with marginal gas exchange. Extubated. Required high flow O2 > BiPAP. Agitated at times. PRN Haldol. Off  vasopressors 5/11  Less agitated and confused. Still required high flow O2 5/12  episodes of desaturations while asleep. Concern for OSA raised by RT. Pt reported that her sleep quality is much improved on BiPAP. Tolerating VM while awake.  5/12  Renal US: normal 5/13  Transfer to SDU ordered   MICRO DATA  MRSA PCR 5/06 >> NEG Urine 5/05 >> E coli (pansens) Blood 5/05 >> E coli (pansens) Legionella Ag 5/06 >> NEG Strep Ag 5/06 >> NEG  ANTIBIOTICS Azithro 5/06 >> 5/07 Ceftriaxone 5/06 >> 5/07 Levofloxacin 5/07 >> 5/08  Vanc 5/07 >> 5/08 Cefepime 5/07 >> 5/09 Cefazolin 5/07 >> 5/12  TUBES / LINES ETT 5/07 >> 5/10 L IJ CVL 5/07 >> 5/12 R radial A-line 5/07 >> 5/10     Discharge Exam: General: WDWN female in NAD, ambulating in hall  Neuro: alert, moves all 4 HEENT: nose without purulence or d/c, neck with LN or TMG Cardiovascular: RRR, 2/6 SEM Lungs: even/non-labored on room air, lungs bilaterally with few faint basilar crackles  Abdomen: Soft, non tender, BSx4 active,  tolerating PO's Ext: no edema, no cyanosis  Filed Vitals:   11/08/14 0100 11/08/14 0405 11/08/14 0712 11/08/14 0741  BP: 102/50 124/49 98/46   Pulse: 77 83 72   Temp:  98.2 F (36.8 C) 98.4 F (36.9 C)   TempSrc:  Oral Oral   Resp: _0 Height:      Weight:      SpO2: 96% 94% 97% 92%     Discharge Labs  BMET  Recent Labs Lab 11/02/14 0431 11/03/14 0410 11/04/14 0450 11/05/14 0500 11/07/14 0400  NA 139 147* 144 142 140  K 3.7 4.0 3.9 3.7 3.1*  CL 112* 112* 107 104 104  CO2 _1 GLUCOSE 127* 126* 144* 150* 112*  BUN _2 CREATININE 0.79 0.87 0.89 0.77 0.70  CALCIUM 8.6* 9.2 9.0 8.9 8.6*  MG 2.1  --   --   --   --   PHOS 4.2  --   --   --   --    CBC  Recent Labs Lab 11/05/14 0500 11/07/14 0400 11/08/14 0305  HGB 7.6* 7.1* 7.4*  HCT 23.5* 21.7* 22.6*  WBC 15.4* 14.3* 15.4*  PLT 480* 511* 574*    Discharge Instructions    Call MD for:   difficulty breathing, headache or visual disturbances    Complete by:  As directed      Call MD for:  extreme fatigue    Complete by:  As directed      Call MD for:  hives    Complete by:  As directed      Call MD for:  persistant dizziness or light-headedness    Complete by:  As directed      Call MD for:  persistant nausea and vomiting    Complete by:  As directed      Call MD for:  severe uncontrolled pain    Complete by:  As directed      Call MD for:  temperature >100.4    Complete by:  As directed      Diet - low sodium heart healthy    Complete by:  As directed      Discharge instructions    Complete by:  As directed   1.  Stop smoking  2.  Review your medications carefully as they have changed 3.  Follow up with [facility] as scheduled with an XRAY 4.  You will need a repeat CBC in 4 weeks     Increase activity slowly    Complete by:  As directed             Follow-up Information    Follow up with [facility].   Why:  appointment December 02, 2014. center will mail you a packet prior to appt.   Contact information:   [address] [place] [phone]      Follow up with PARRETT,TAMMY, NP On 11/12/2014.   Specialty:  Nurse Practitioner   Why:  Appt at 2:00 PM - please arrive 15 minutes before your appointment.     Contact information:   [address] [place] [address] [phone]       Follow up with Adrian Prows, MD.   Specialty:  Cardiology   Why:  Follow up as previously scheduled.    Contact information:   [address] [place] [address]  [phone]          Medication List    STOP taking these medications        furosemide 20 MG tablet  Commonly known as:  LASIX     GARCINIA CAMBOGIA-CHROMIUM PO     oxyCODONE-acetaminophen 5-325 MG per tablet  Commonly known as:  ROXICET     verapamil 120 MG 24 hr capsule  Commonly known as:  VERELAN PM      TAKE these  medications        acetaminophen 500 MG tablet  Commonly known as:  TYLENOL  Take 1,000 mg by mouth every 6 (six) hours as needed for mild pain or moderate pain.     ALPRAZolam 1 MG tablet  Commonly known as:  XANAX  Take 0.5 tablets (0.5 mg total) by mouth 3 (three) times daily. anxiety     aspirin EC 81 MG tablet  Take 81 mg by mouth daily.     atorvastatin 20 MG tablet  Commonly known as:  LIPITOR  Take 20 mg by mouth daily.     FLUoxetine 40 MG capsule  Commonly known as:  PROZAC  Take 40-80 mg by mouth daily before breakfast.     ibuprofen 200 MG tablet  Commonly known as:  ADVIL,MOTRIN  Take 600 mg by mouth every 6 (six) hours as needed for fever, headache or mild pain.     omeprazole 20 MG capsule  Commonly known as:  PRILOSEC  Take 20 mg by mouth daily.     psyllium 0.52 G capsule  Commonly known as:  REGULOID  Take 5 capsules by mouth daily.     traZODone 50 MG tablet  Commonly known as:  DESYREL  Take 50-100 mg by mouth at bedtime.          Disposition:  Home.  Home health PT arranged for discharge.    Discharged Condition: Holly Figueroa has met maximum benefit of inpatient care and is medically stable and cleared for discharge.  Patient is pending follow up as above.      Time spent on disposition:  Greater than 35 minutes.   Signed: Noe Gens, NP-C [facility] Pgr: [phone] Office: [phone]   Merton Border, MD ; [facility] [phone].  After 5:30 PM or weekends, call [phone]   CC:  Dr.  Einar Gip and Welford Roche at [facility]

## 2014-11-08 NOTE — Progress Notes (Signed)
11/08/2014 patient ambulate in hall wall on roomair she was 97 to 98% when she left her room. She contine to maintain saturation at 93 to 94%.  Patient saturation did drop to 88%, but increase 90 to 92%.  Patient did not c/o of any shortness of breath while walking. Noe Gens (NP) was made aware. [facility] RN.

## 2014-11-12 ENCOUNTER — Ambulatory Visit (INDEPENDENT_AMBULATORY_CARE_PROVIDER_SITE_OTHER)
Admission: RE | Admit: 2014-11-12 | Discharge: 2014-11-12 | Disposition: A | Discharge status: Home / Self Care | Payer: Medicare FFS | Source: Ambulatory Visit | Attending: Adult Health | Admitting: Adult Health

## 2014-11-12 ENCOUNTER — Encounter: Payer: Self-pay | Admitting: Adult Health

## 2014-11-12 ENCOUNTER — Ambulatory Visit (INDEPENDENT_AMBULATORY_CARE_PROVIDER_SITE_OTHER): Payer: Medicare FFS | Admitting: Adult Health

## 2014-11-12 VITALS — BP 122/80 | HR 102 | Temp 97.7°F | Ht 66.0 in | Wt 159.6 lb

## 2014-11-12 DIAGNOSIS — J80 Acute respiratory distress syndrome: Secondary | ICD-10-CM

## 2014-11-12 DIAGNOSIS — R0902 Hypoxemia: Secondary | ICD-10-CM

## 2014-11-12 DIAGNOSIS — J189 Pneumonia, unspecified organism: Secondary | ICD-10-CM

## 2014-11-12 NOTE — Patient Instructions (Signed)
Great Job on not smoking  Follow up for Sleep study as planned  Follow up with cardiology as planned  follow up Dr. Chase Caller in 4-6 weeks with PFT and As needed   Please contact office for sooner follow up if symptoms do not improve or worsen or seek emergency care

## 2014-11-15 NOTE — Assessment & Plan Note (Signed)
Improved  Nocturnal desats in hospital concern for OSA >>Has sleep study planned   Plan  Great Job on not smoking  Sleep study  follow up Dr. Chase Caller in 4-6 weeks with PFT and As needed   Please contact office for sooner follow up if symptoms do not improve or worsen or seek emergency care

## 2014-11-15 NOTE — Assessment & Plan Note (Signed)
Clinically improved  Will need PFT  cxr is improving -repeat on follow up in 4 weeks   Plan  Great Job on not smoking   follow up Dr. Chase Caller in 4-6 weeks with PFT and As needed   Please contact office for sooner follow up if symptoms do not improve or worsen or seek emergency care

## 2014-11-15 NOTE — Assessment & Plan Note (Signed)
Clinically improving  cxr shows continued improvement  Plan  Great Job on not smoking  follow up Dr. Chase Caller in 4-6 weeks with PFT and As needed   Please contact office for sooner follow up if symptoms do not improve or worsen or seek emergency care

## 2014-11-15 NOTE — Progress Notes (Signed)
Subjective:    Patient ID: Holly Figueroa, female    DOB: 17-Sep-1965, 49 y.o.   MRN: [id number]  HPI 49 year old female tobacco abuse with mitral stenosis. Admitted end march 2012 for acute resp failure, pulmonary edema due to CHF.   PFTs today are normal except for isolated low dlco 55% that could reflect early emphysema or Mitral stenosis.  Seen for initial post hospital follow up 09/2010  11/11/13 Post hospital follow up  Pt returns for a post hospital follow up.  Admitted 5/5-5/16 for severe septic shock, E.coli UTI/Bacteremia, ARDS , Resp Failure  She was treated with aggressive IV abx, mechanical ventilation,  Set up for follow up with Dr. Einar Gip for AV and MV stenosis .  Set up for sleep study as nocturnal desats for OSA. Since discharge she is feeling much better Has not smoked since discharge.  Last seen in office 09/2010 .  She is on no inhalers, no previous dx of COPD w/ PFT normal in 2012. (except low dlco) Says she was doing well and then suddenly got sick prior to admit.  Denies chest pain, orthopnea, fever, discolored mucus, edema.  Still weak and wears out quickly .    Review of Systems Constitutional:   No  weight loss, night sweats,  Fevers, chills,  +fatigue, or  lassitude.  HEENT:   No headaches,  Difficulty swallowing,  Tooth/dental problems, or  Sore throat,                No sneezing, itching, ear ache, nasal congestion, post nasal drip,   CV:  No chest pain,  Orthopnea, PND, swelling in lower extremities, anasarca, dizziness, palpitations, syncope.   GI  No heartburn, indigestion, abdominal pain, nausea, vomiting, diarrhea, change in bowel habits, loss of appetite, bloody stools.   Resp:    No chest wall deformity  Skin: no rash or lesions.  GU: no dysuria, change in color of urine, no urgency or frequency.  No flank pain, no hematuria   MS:  No joint pain or swelling.  No decreased range of motion.  No back pain.  Psych:  No change in mood or affect.  No depression or anxiety.  No memory loss.         Objective:   Physical Exam GEN: A/Ox3; pleasant , NAD, well nourished   HEENT:  [place]/AT,  EACs-clear, TMs-wnl, NOSE-clear, THROAT-clear, no lesions, no postnasal drip or exudate noted.   NECK:  Supple w/ fair ROM; no JVD; normal carotid impulses w/o bruits; no thyromegaly or nodules palpated; no lymphadenopathy.  RESP  Clear  P & A; w/o, wheezes/ rales/ or rhonchi.no accessory muscle use, no dullness to percussion  CARD:  RRR, no m/r/g  , no peripheral edema, pulses intact, no cyanosis or clubbing.  GI:   Soft & nt; nml bowel sounds; no organomegaly or masses detected.  Musco: Warm bil, no deformities or joint swelling noted.   Neuro: alert, no focal deficits noted.    Skin: Warm, no lesions or rashes         Assessment & Plan:

## 2014-12-02 ENCOUNTER — Encounter (HOSPITAL_BASED_OUTPATIENT_CLINIC_OR_DEPARTMENT_OTHER): Payer: Medicare (Managed Care)

## 2014-12-31 ENCOUNTER — Ambulatory Visit: Payer: Medicare FFS | Admitting: Internal Medicine

## 2016-02-27 ENCOUNTER — Encounter (HOSPITAL_COMMUNITY): Payer: Self-pay | Admitting: Emergency Medicine

## 2016-02-27 ENCOUNTER — Emergency Department (HOSPITAL_COMMUNITY): Payer: Medicare FFS

## 2016-02-27 ENCOUNTER — Emergency Department (HOSPITAL_COMMUNITY)
Admission: EM | Admit: 2016-02-27 | Discharge: 2016-02-27 | Disposition: A | Discharge status: Home / Self Care | Payer: Medicare FFS | Attending: Emergency Medicine | Admitting: Emergency Medicine

## 2016-02-27 DIAGNOSIS — I509 Heart failure, unspecified: Secondary | ICD-10-CM | POA: Diagnosis not present

## 2016-02-27 DIAGNOSIS — I110 Hypertensive heart disease with heart failure: Secondary | ICD-10-CM | POA: Insufficient documentation

## 2016-02-27 DIAGNOSIS — Z87891 Personal history of nicotine dependence: Secondary | ICD-10-CM | POA: Diagnosis not present

## 2016-02-27 DIAGNOSIS — F13939 Sedative, hypnotic or anxiolytic use, unspecified with withdrawal, unspecified: Secondary | ICD-10-CM

## 2016-02-27 DIAGNOSIS — Z8541 Personal history of malignant neoplasm of cervix uteri: Secondary | ICD-10-CM | POA: Insufficient documentation

## 2016-02-27 DIAGNOSIS — Z7982 Long term (current) use of aspirin: Secondary | ICD-10-CM | POA: Insufficient documentation

## 2016-02-27 DIAGNOSIS — R51 Headache: Secondary | ICD-10-CM | POA: Insufficient documentation

## 2016-02-27 DIAGNOSIS — F13239 Sedative, hypnotic or anxiolytic dependence with withdrawal, unspecified: Secondary | ICD-10-CM | POA: Diagnosis not present

## 2016-02-27 DIAGNOSIS — Z79899 Other long term (current) drug therapy: Secondary | ICD-10-CM | POA: Insufficient documentation

## 2016-02-27 DIAGNOSIS — R569 Unspecified convulsions: Secondary | ICD-10-CM

## 2016-02-27 LAB — URINE MICROSCOPIC-ADD ON

## 2016-02-27 LAB — CBC WITH DIFFERENTIAL/PLATELET
BASOS PCT: 0 %
Basophils Absolute: 0 10*3/uL (ref 0.0–0.1)
EOS PCT: 2 %
Eosinophils Absolute: 0.4 10*3/uL (ref 0.0–0.7)
HEMATOCRIT: 43.5 % (ref 36.0–46.0)
HEMOGLOBIN: 13.9 g/dL (ref 12.0–15.0)
LYMPHS PCT: 33 %
Lymphs Abs: 6.6 10*3/uL — ABNORMAL HIGH (ref 0.7–4.0)
MCH: 31.3 pg (ref 26.0–34.0)
MCHC: 32 g/dL (ref 30.0–36.0)
MCV: 98 fL (ref 78.0–100.0)
MONO ABS: 1.4 10*3/uL — AB (ref 0.1–1.0)
MONOS PCT: 7 %
NEUTROS PCT: 58 %
Neutro Abs: 11.7 10*3/uL — ABNORMAL HIGH (ref 1.7–7.7)
Platelets: 428 10*3/uL — ABNORMAL HIGH (ref 150–400)
RBC: 4.44 MIL/uL (ref 3.87–5.11)
RDW: 12.8 % (ref 11.5–15.5)
WBC: 20.1 10*3/uL — ABNORMAL HIGH (ref 4.0–10.5)

## 2016-02-27 LAB — I-STAT CHEM 8, ED
BUN: 13 mg/dL (ref 6–20)
CREATININE: 0.9 mg/dL (ref 0.44–1.00)
Calcium, Ion: 1.21 mmol/L (ref 1.15–1.40)
Chloride: 107 mmol/L (ref 101–111)
Glucose, Bld: 277 mg/dL — ABNORMAL HIGH (ref 65–99)
HEMATOCRIT: 47 % — AB (ref 36.0–46.0)
Hemoglobin: 16 g/dL — ABNORMAL HIGH (ref 12.0–15.0)
Potassium: 3.1 mmol/L — ABNORMAL LOW (ref 3.5–5.1)
Sodium: 143 mmol/L (ref 135–145)
TCO2: 14 mmol/L (ref 0–100)

## 2016-02-27 LAB — I-STAT CG4 LACTIC ACID, ED
Lactic Acid, Venous: 15.77 mmol/L (ref 0.5–1.9)
Lactic Acid, Venous: 1.42 mmol/L (ref 0.5–1.9)

## 2016-02-27 LAB — COMPREHENSIVE METABOLIC PANEL
ALK PHOS: 121 U/L (ref 38–126)
ALT: 77 U/L — AB (ref 14–54)
AST: 59 U/L — ABNORMAL HIGH (ref 15–41)
Albumin: 4.7 g/dL (ref 3.5–5.0)
Anion gap: 22 — ABNORMAL HIGH (ref 5–15)
BUN: 13 mg/dL (ref 6–20)
CALCIUM: 9.9 mg/dL (ref 8.9–10.3)
CO2: 11 mmol/L — ABNORMAL LOW (ref 22–32)
CREATININE: 1.35 mg/dL — AB (ref 0.44–1.00)
Chloride: 105 mmol/L (ref 101–111)
GFR, EST AFRICAN AMERICAN: 52 mL/min — AB (ref >60)
GFR, EST NON AFRICAN AMERICAN: 45 mL/min — AB (ref >60)
Glucose, Bld: 285 mg/dL — ABNORMAL HIGH (ref 65–99)
Potassium: 3.1 mmol/L — ABNORMAL LOW (ref 3.5–5.1)
Sodium: 138 mmol/L (ref 135–145)
Total Bilirubin: 0.7 mg/dL (ref 0.3–1.2)
Total Protein: 8.8 g/dL — ABNORMAL HIGH (ref 6.5–8.1)

## 2016-02-27 LAB — URINALYSIS, ROUTINE W REFLEX MICROSCOPIC
BILIRUBIN URINE: NEGATIVE
KETONES UR: 15 mg/dL — AB
Nitrite: POSITIVE — AB
PH: 6 (ref 5.0–8.0)
Protein, ur: 30 mg/dL — AB
SPECIFIC GRAVITY, URINE: 1.026 (ref 1.005–1.030)

## 2016-02-27 LAB — PROTIME-INR
INR: 1.12
PROTHROMBIN TIME: 14.5 s (ref 11.4–15.2)

## 2016-02-27 LAB — POC URINE PREG, ED: Preg Test, Ur: NEGATIVE

## 2016-02-27 LAB — LIPASE, BLOOD: LIPASE: 31 U/L (ref 11–51)

## 2016-02-27 LAB — RAPID URINE DRUG SCREEN, HOSP PERFORMED
AMPHETAMINES: NOT DETECTED
BARBITURATES: NOT DETECTED
Benzodiazepines: POSITIVE — AB
COCAINE: NOT DETECTED
Opiates: NOT DETECTED
TETRAHYDROCANNABINOL: NOT DETECTED

## 2016-02-27 LAB — CBG MONITORING, ED: Glucose-Capillary: 288 mg/dL — ABNORMAL HIGH (ref 65–99)

## 2016-02-27 MED ORDER — POTASSIUM CHLORIDE CRYS ER 20 MEQ PO TBCR
40.0000 meq | EXTENDED_RELEASE_TABLET | Freq: Once | ORAL | Status: AC
  Administered 2016-02-27: 40 meq via ORAL
  Filled 2016-02-27: qty 2

## 2016-02-27 MED ORDER — SODIUM CHLORIDE 0.9 % IV BOLUS (SEPSIS)
2000.0000 mL | Freq: Once | INTRAVENOUS | Status: AC
  Administered 2016-02-27: 2000 mL via INTRAVENOUS

## 2016-02-27 MED ORDER — ACETAMINOPHEN 325 MG PO TABS
650.0000 mg | ORAL_TABLET | Freq: Once | ORAL | Status: AC
  Administered 2016-02-27: 650 mg via ORAL
  Filled 2016-02-27: qty 2

## 2016-02-27 MED ORDER — ALPRAZOLAM 0.25 MG PO TABS
1.0000 mg | ORAL_TABLET | Freq: Once | ORAL | Status: AC
  Administered 2016-02-27: 1 mg via ORAL
  Filled 2016-02-27: qty 4

## 2016-02-27 NOTE — ED Notes (Signed)
Per husband at bedside, pt takes xanax qid. Pt has been visiting with family all day and has not taken any medication. Pt was "feeling funny," while walking to the car in the parking lot had a grand mal seizure. Pt was assisted to the ground. Pt diaphoretic with gaze to the L side. Pt alert to self and time only.

## 2016-02-27 NOTE — ED Triage Notes (Signed)
Pt arrives from parking lot, witnessed grandmal seizure last approx 2-3 minutes. No hx of seizure. Found by nurses in sidelying position being supported by bystanders. Pt was lowered to the ground, did not hit her head. Snoring respirations but airway intact, good peripheral pulses. No verbal response, responsive to painful stimuli. Placed on long spine board for transport and escorted to trauma bay. Snoring respirations resolved by the time she got to trauma room. Has been visiting family all day in the hospital and hasn't taken any of her daily meds, hx benzo use including xanax.

## 2016-02-27 NOTE — ED Provider Notes (Signed)
[facility] DEPT Provider Note   CSN: [id number] Arrival date & time: 02/27/16  1224  By signing my name below, I, Reola Mosher, attest that this documentation has been prepared under the direction and in the presence of Everlene Balls, MD. Electronically Signed: Reola Mosher, ED Scribe. 02/27/16. 1:14 AM.  History   Chief Complaint Chief Complaint  Patient presents with  . Seizures   LEVEL 5 CAVEAT: HPI and ROS limited due to AMS  The history is provided by a relative. A language interpreter was used.    HPI Comments: NEKEYA BRISKI is a 50 y.o. female with a PMHx of benzodiazepine dependence, CHF, and acute respiratory failure, who presents to the Emergency Department s/p witnessed full body seizure that occurred just PTA. Per family, her seizure lasted for approximately 2-3 minutes. No hx of seizures in the past. Per family member, they had been visiting the hospital all day for another family member, and as they were leaving and in the parking lot her episode began. At that time, she was lowered to the ground. Pt takes Xanax and Prozac QID, and her relative reports that she last took each dosage last night.   Past Medical History:  Diagnosis Date  . Acute respiratory failure ([place])   . Anemia 08-2008   Blood transfusion  . Anxiety   . Benzodiazepine dependence ([place])   . Cervical cancer ([place])   . CHF (congestive heart failure) ([place])   . Depression   . Headache(784.0)    migraines  . Hypertension   . Hypokalemia   . Legionella pneumonia ([place])   . Leukocytosis, unspecified   . Mitral stenosis   . Panic attacks   . Tobacco abuse    Patient Active Problem List   Diagnosis Date Noted  . ARDS (adult respiratory distress syndrome) ([place]) 10/30/2014  . Bilateral pneumonia 10/29/2014  . CAP (community acquired pneumonia) 10/29/2014  . Hypokalemia   . Hypoxia   . UTI (lower urinary tract infection)   . Tobacco abuse 10/11/2010  . Acute respiratory failure  ([place]) 10/11/2010   Past Surgical History:  Procedure Laterality Date  . CARDIAC VALVE REPLACEMENT  2011   stretched mitral valve  . CERVICAL CONIZATION W/BX  2000  . CHOLECYSTECTOMY  06/26/2012   Procedure: LAPAROSCOPIC CHOLECYSTECTOMY WITH INTRAOPERATIVE CHOLANGIOGRAM;  Surgeon: Ralene Ok, MD;  Location: [facility];  Service: General;  Laterality: N/A;  . LASER ABLATION OF THE CERVIX    . MOLE REMOVAL    . NASAL SINUS SURGERY      OB History    No data available       Home Medications    Prior to Admission medications   Medication Sig Start Date End Date Taking? Authorizing Provider  acetaminophen (TYLENOL) 500 MG tablet Take 1,000 mg by mouth every 6 (six) hours as needed for mild pain or moderate pain.   Yes Historical Provider, MD  ALPRAZolam Duanne Moron) 1 MG tablet Take 0.5 tablets (0.5 mg total) by mouth 3 (three) times daily. anxiety Patient taking differently: Take 1 mg by mouth 3 (three) times daily. anxiety 11/08/14  Yes Donita Brooks, NP  aspirin EC 81 MG tablet Take 81 mg by mouth daily.   Yes Historical Provider, MD  FLUoxetine (PROZAC) 40 MG capsule Take 40 mg by mouth 2 (two) times daily.    Yes Historical Provider, MD  furosemide (LASIX) 20 MG tablet Take 20 mg by mouth daily as needed for fluid.   Yes Historical Provider,  MD  ibuprofen (ADVIL,MOTRIN) 200 MG tablet Take 600 mg by mouth every 6 (six) hours as needed for fever, headache or mild pain.   Yes Historical Provider, MD  omeprazole (PRILOSEC) 20 MG capsule Take 20 mg by mouth daily.     Yes Historical Provider, MD  traZODone (DESYREL) 50 MG tablet Take 50-100 mg by mouth at bedtime.   Yes Historical Provider, MD    Family History Family History  Problem Relation Age of Onset  . Heart disease Father   . COPD Father   . Heart failure Maternal Grandmother   . Stroke Mother     Social History Social History  Substance Use Topics  . Smoking status: Former Smoker    Packs/day: 1.00    Years: 23.00     Types: Cigarettes    Quit date: 10/28/2014  . Smokeless tobacco: Never Used  . Alcohol use No     Allergies   Iodinated diagnostic agents; Sulfa antibiotics; Dilaudid [hydromorphone hcl]; and Hydrocodone   Review of Systems Review of Systems  Unable to perform ROS: Mental status change   Physical Exam Updated Vital Signs BP 152/83   Pulse 94   Temp 99.2 F (37.3 C) (Oral)   Resp (!) 27   LMP  (LMP Unknown)   SpO2 95%   Physical Exam  Constitutional: She appears well-developed and well-nourished. No distress.  HENT:  Head: Normocephalic and atraumatic.  Nose: Nose normal.  Mouth/Throat: Oropharynx is clear and moist. No oropharyngeal exudate.  Eyes: Conjunctivae and EOM are normal. Pupils are equal, round, and reactive to light. No scleral icterus.  Neck: Normal range of motion. Neck supple. No JVD present. No tracheal deviation present. No thyromegaly present.  Cardiovascular: Regular rhythm and normal heart sounds.  Tachycardia present.  Exam reveals no gallop and no friction rub.   No murmur heard. Pulmonary/Chest: Effort normal and breath sounds normal. No respiratory distress. She has no wheezes. She exhibits no tenderness.  Abdominal: Soft. Bowel sounds are normal. She exhibits no distension and no mass. There is no tenderness. There is no rebound and no guarding.  Musculoskeletal: Normal range of motion. She exhibits no edema or tenderness.  Lymphadenopathy:    She has no cervical adenopathy.  Neurological: She is alert.  Moves all four extremities to painful stimuli. Does not follow commands. Able to track w/ eyes across the midline.   Skin: Skin is warm and dry. No rash noted. No erythema. No pallor.  Nursing note and vitals reviewed.  ED Treatments / Results  DIAGNOSTIC STUDIES: Oxygen Saturation is 87% on RA, low by my interpretation.   Labs (all labs ordered are listed, but only abnormal results are displayed) Labs Reviewed  CBC WITH  DIFFERENTIAL/PLATELET - Abnormal; Notable for the following:       Result Value   WBC 20.1 (*)    Platelets 428 (*)    Neutro Abs 11.7 (*)    Lymphs Abs 6.6 (*)    Monocytes Absolute 1.4 (*)    All other components within normal limits  COMPREHENSIVE METABOLIC PANEL - Abnormal; Notable for the following:    Potassium 3.1 (*)    CO2 11 (*)    Glucose, Bld 285 (*)    Creatinine, Ser 1.35 (*)    Total Protein 8.8 (*)    AST 59 (*)    ALT 77 (*)    GFR calc non Af Amer 45 (*)    GFR calc Af Amer 52 (*)  Anion gap 22 (*)    All other components within normal limits  URINALYSIS, ROUTINE W REFLEX MICROSCOPIC (NOT AT [facility]) - Abnormal; Notable for the following:    APPearance CLOUDY (*)    Glucose, UA >1000 (*)    Hgb urine dipstick TRACE (*)    Ketones, ur 15 (*)    Protein, ur 30 (*)    Nitrite POSITIVE (*)    Leukocytes, UA TRACE (*)    All other components within normal limits  URINE RAPID DRUG SCREEN, HOSP PERFORMED - Abnormal; Notable for the following:    Benzodiazepines POSITIVE (*)    All other components within normal limits  URINE MICROSCOPIC-ADD ON - Abnormal; Notable for the following:    Squamous Epithelial / LPF 0-5 (*)    Bacteria, UA FEW (*)    Casts HYALINE CASTS (*)    All other components within normal limits  I-STAT CG4 LACTIC ACID, ED - Abnormal; Notable for the following:    Lactic Acid, Venous 15.77 (*)    All other components within normal limits  I-STAT CHEM 8, ED - Abnormal; Notable for the following:    Potassium 3.1 (*)    Glucose, Bld 277 (*)    Hemoglobin 16.0 (*)    HCT 47.0 (*)    All other components within normal limits  CBG MONITORING, ED - Abnormal; Notable for the following:    Glucose-Capillary 288 (*)    All other components within normal limits  URINE CULTURE  LIPASE, BLOOD  PROTIME-INR  ETHANOL  POC URINE PREG, ED  I-STAT CG4 LACTIC ACID, ED    EKG  EKG Interpretation None       Radiology Ct Head Wo Contrast  Result  Date: 02/27/2016 CLINICAL DATA:  50 year old female with new onset seizure and frontal headache. EXAM: CT HEAD WITHOUT CONTRAST TECHNIQUE: Contiguous axial images were obtained from the base of the skull through the vertex without intravenous contrast. COMPARISON:  CT dated 11/01/2014 FINDINGS: The ventricles and sulci appropriate size for patient's age. Stable 6 x 7 mm high attenuating nodular density along the septum pellucidum. The gray-white matter discrimination is preserved. There is no acute intracranial hemorrhage. No mass effect or midline shift noted. No extra-axial fluid collections. Small right sphenoid sinus retention cyst or polyp. There is mild diffuse mucoperiosteal thickening of paranasal sinuses. No air-fluid levels. The mastoid air cells are clear. The calvarium is intact. IMPRESSION: No acute intracranial pathology. Stable high attenuating nodular density abutting the right side of the septum pellucidum. Further evaluation with MRI if not previously performed recommended. Electronically Signed   By: Anner Crete M.D.   On: 02/27/2016 02:50   Dg Chest Port 1 View  Result Date: 02/27/2016 CLINICAL DATA:  50 year old female with new onset seizure. EXAM: PORTABLE CHEST 1 VIEW COMPARISON:  Chest radiograph dated 11/12/2014 FINDINGS: Single portable view of the chest demonstrate mild diffuse interstitial prominence involving the mid to lower lung fields bilaterally which may be related to atelectatic changes or scarring. Developing infiltrate is less likely but not excluded. There is no focal consolidation, pleural effusion, or pneumothorax. Top-normal cardiac silhouette. No acute osseous pathology. IMPRESSION: Bibasilar atelectasis.  No focal consolidation. Electronically Signed   By: Anner Crete M.D.   On: 02/27/2016 02:29    Procedures Procedures (including critical care time)  Medications Ordered in ED Medications  ALPRAZolam (XANAX) tablet 1 mg (not administered)  sodium  chloride 0.9 % bolus 2,000 mL (0 mLs Intravenous Stopped 02/27/16 0442)  potassium chloride SA (K-DUR,KLOR-CON) CR tablet 40 mEq (40 mEq Oral Given 02/27/16 0246)  acetaminophen (TYLENOL) tablet 650 mg (650 mg Oral Given 02/27/16 0349)     Initial Impression / Assessment and Plan / ED Course  I have reviewed the triage vital signs and the nursing notes.  Pertinent labs & imaging results that were available during my care of the patient were reviewed by me and considered in my medical decision making (see chart for details).  Clinical Course  Value Comment By Time  CT HEAD WO CONTRAST (Reviewed) Erasmo Downer 09/04 0223    Patient presents to the ED for seizure.  First time event, so CT scan was ordered as well as broad work up.  5:08 AM CT negative for cause of seizure.  Upon further history with the husband, patient is chronically on xanax 4x per day, but has not had her medications recently due to being in and out of the hospital visiting her mother in the ICU.  This is likely benzo withdrawal seizure.  She has more medications at home and plans to get them.  She is requesting one prior to DC which was provided.  UA reveals possible infection, but patient denies all complaints.  Will not treat this today.  Lactate likely elevated due to true seizure. She was given 2L IVF. Potassium replaced.  She appears well and in NAD, VS remain within her normal limits and she is safe for DC.  Final Clinical Impressions(s) / ED Diagnoses   Final diagnoses:  None    New Prescriptions New Prescriptions   No medications on file      I personally performed the services described in this documentation, which was scribed in my presence. The recorded information has been reviewed and is accurate.      Everlene Balls, MD 02/27/16 [phone]

## 2016-02-27 NOTE — ED Notes (Signed)
Patient taken to CT

## 2016-02-27 NOTE — ED Notes (Signed)
Pt given paper scrubs at discharge

## 2016-02-28 LAB — PATHOLOGIST SMEAR REVIEW: PATH REVIEW: REACTIVE

## 2016-02-29 LAB — URINE CULTURE

## 2016-03-01 ENCOUNTER — Telehealth (HOSPITAL_BASED_OUTPATIENT_CLINIC_OR_DEPARTMENT_OTHER): Payer: Self-pay | Admitting: *Deleted

## 2016-03-01 NOTE — Telephone Encounter (Signed)
Post ED Visit - Positive Culture Follow-up  Culture report reviewed by antimicrobial stewardship pharmacist:  _0  Elenor Quinones, Pharm.D. _1  Heide Guile, Pharm.D., BCPS _2  Parks Neptune, Pharm.D. _3  Alycia Rossetti, Pharm.D., BCPS _4  [place], [place].D., BCPS, AAHIVP _5  Legrand Como, Pharm.D., BCPS, AAHIVP _6  Milus Glazier, Pharm.D. _7  Stephens November, Pharm.D. Arlean Hopping, PA-C  Asymptomatic bacturia No further patient follow-up is required at this time.  Harlon [facility] 03/01/2016, 9:33 AM

## 2016-03-16 ENCOUNTER — Emergency Department (HOSPITAL_COMMUNITY): Payer: Medicare FFS

## 2016-03-16 ENCOUNTER — Encounter (HOSPITAL_COMMUNITY): Payer: Self-pay | Admitting: *Deleted

## 2016-03-16 ENCOUNTER — Emergency Department (HOSPITAL_COMMUNITY)
Admission: EM | Admit: 2016-03-16 | Discharge: 2016-03-16 | Disposition: A | Discharge status: Home / Self Care | Payer: Medicare FFS | Attending: Emergency Medicine | Admitting: Emergency Medicine

## 2016-03-16 DIAGNOSIS — Z7982 Long term (current) use of aspirin: Secondary | ICD-10-CM | POA: Diagnosis not present

## 2016-03-16 DIAGNOSIS — R05 Cough: Secondary | ICD-10-CM

## 2016-03-16 DIAGNOSIS — J209 Acute bronchitis, unspecified: Secondary | ICD-10-CM

## 2016-03-16 DIAGNOSIS — Z87891 Personal history of nicotine dependence: Secondary | ICD-10-CM | POA: Insufficient documentation

## 2016-03-16 DIAGNOSIS — I509 Heart failure, unspecified: Secondary | ICD-10-CM | POA: Diagnosis not present

## 2016-03-16 DIAGNOSIS — R059 Cough, unspecified: Secondary | ICD-10-CM

## 2016-03-16 DIAGNOSIS — I110 Hypertensive heart disease with heart failure: Secondary | ICD-10-CM | POA: Diagnosis not present

## 2016-03-16 DIAGNOSIS — Z79899 Other long term (current) drug therapy: Secondary | ICD-10-CM | POA: Insufficient documentation

## 2016-03-16 LAB — BASIC METABOLIC PANEL
Anion gap: 3 — ABNORMAL LOW (ref 5–15)
BUN: 10 mg/dL (ref 6–20)
CALCIUM: 8.9 mg/dL (ref 8.9–10.3)
CO2: 22 mmol/L (ref 22–32)
CREATININE: 0.82 mg/dL (ref 0.44–1.00)
Chloride: 108 mmol/L (ref 101–111)
GFR calc Af Amer: >60 mL/min (ref >60)
GLUCOSE: 221 mg/dL — AB (ref 65–99)
Potassium: 3.9 mmol/L (ref 3.5–5.1)
SODIUM: 133 mmol/L — AB (ref 135–145)

## 2016-03-16 LAB — TROPONIN I

## 2016-03-16 MED ORDER — HYDROCOD POLST-CPM POLST ER 10-8 MG/5ML PO SUER
5.0000 mL | Freq: Once | ORAL | Status: AC
  Administered 2016-03-16: 5 mL via ORAL
  Filled 2016-03-16: qty 5

## 2016-03-16 MED ORDER — AZITHROMYCIN 250 MG PO TABS: ORAL_TABLET | ORAL | 0 refills | Status: DC

## 2016-03-16 MED ORDER — DOXYCYCLINE HYCLATE 100 MG PO TABS
100.0000 mg | ORAL_TABLET | Freq: Once | ORAL | Status: AC
  Administered 2016-03-16: 100 mg via ORAL
  Filled 2016-03-16: qty 1

## 2016-03-16 MED ORDER — PROMETHAZINE-CODEINE 6.25-10 MG/5ML PO SYRP: 10.0000 mL | ORAL_SOLUTION | Freq: Four times a day (QID) | ORAL | 0 refills | Status: DC | PRN

## 2016-03-16 MED ORDER — PROMETHAZINE HCL 12.5 MG PO TABS
12.5000 mg | ORAL_TABLET | Freq: Once | ORAL | Status: AC
  Administered 2016-03-16: 12.5 mg via ORAL
  Filled 2016-03-16: qty 1

## 2016-03-16 MED ORDER — AZITHROMYCIN 250 MG PO TABS
500.0000 mg | ORAL_TABLET | Freq: Once | ORAL | Status: AC
  Administered 2016-03-16: 500 mg via ORAL
  Filled 2016-03-16: qty 2

## 2016-03-16 NOTE — ED Notes (Signed)
Pt ambulated and did well with ambulation. Oxygen saturations remained 95-98% HR 102.

## 2016-03-16 NOTE — Discharge Instructions (Signed)
Your cardiac enzymes are negative for acute event. Your electrolytes are within normal limits. Your oxygen level is between 96 and 98% on room air. Your chest x-ray questions infection in your lungs. Please increase your fluids. Use Zithromax daily starting tomorrow September 23. Use your cough medication every 6 hours as needed for cough and congestion. This medication may cause drowsiness, please use it with caution. Please see your primary physician, or return to the emergency department if not improving.

## 2016-03-16 NOTE — ED Provider Notes (Signed)
[facility] DEPT Provider Note   CSN: [id number] Arrival date & time: 03/16/16  1100     History   Chief Complaint Chief Complaint  Patient presents with  . Cough    HPI Holly Figueroa is a 50 y.o. female.  Patient is a 50 year old female who presents to the emergency department with a complaint of cough and chest soreness.  The patient states that for the past 3 or 4 days she's been having problems with shortness of breath, feeling loopy, and generally not feeling well. The patient states she doesn't laugh coughing. She states the cough is nonproductive. She now has pain across her chest. The patient is concerned because she has a history of pneumonia and seizures. Patient is also concerned because she had a glucose elevated at 221 earlier during the week. She is scheduled to have blood work done by her primary physician in for genuine. Patient feels like she's had some subjective of fever, she has not actually measured her temperature. She has had aching and chills. She has a history of mitral stenosis. Nothing seems to make this problem any better, and nothing makes it any worse.      Past Medical History:  Diagnosis Date  . Acute respiratory failure ([place])   . Anemia 08-2008   Blood transfusion  . Anxiety   . Benzodiazepine dependence ([place])   . Cervical cancer ([place])   . CHF (congestive heart failure) ([place])   . Depression   . Headache(784.0)    migraines  . Hypertension   . Hypokalemia   . Legionella pneumonia ([place])   . Leukocytosis, unspecified   . Mitral stenosis   . Panic attacks   . Tobacco abuse     Patient Active Problem List   Diagnosis Date Noted  . ARDS (adult respiratory distress syndrome) ([place]) 10/30/2014  . Bilateral pneumonia 10/29/2014  . CAP (community acquired pneumonia) 10/29/2014  . Hypokalemia   . Hypoxia   . UTI (lower urinary tract infection)   . Tobacco abuse 10/11/2010  . Acute respiratory failure ([place]) 10/11/2010    Past  Surgical History:  Procedure Laterality Date  . CARDIAC VALVE REPLACEMENT  2011   stretched mitral valve  . CERVICAL CONIZATION W/BX  2000  . CHOLECYSTECTOMY  06/26/2012   Procedure: LAPAROSCOPIC CHOLECYSTECTOMY WITH INTRAOPERATIVE CHOLANGIOGRAM;  Surgeon: Ralene Ok, MD;  Location: [facility] OR[facility];  Service: General;  Laterality: N/A;  . LASER ABLATION OF THE CERVIX    . MOLE REMOVAL    . NASAL SINUS SURGERY      OB History    No data available       Home Medications    Prior to Admission medications   Medication Sig Start Date End Date Taking? Authorizing Provider  acetaminophen (TYLENOL) 500 MG tablet Take 1,000 mg by mouth every 6 (six) hours as needed for mild pain or moderate pain.    Historical Provider, MD  ALPRAZolam Duanne Moron) 1 MG tablet Take 0.5 tablets (0.5 mg total) by mouth 3 (three) times daily. anxiety Patient taking differently: Take 1 mg by mouth 3 (three) times daily. anxiety 11/08/14   Donita Brooks, NP  aspirin EC 81 MG tablet Take 81 mg by mouth daily.    Historical Provider, MD  FLUoxetine (PROZAC) 40 MG capsule Take 40 mg by mouth 2 (two) times daily.     Historical Provider, MD  furosemide (LASIX) 20 MG tablet Take 20 mg by mouth daily as needed for fluid.    Historical  Provider, MD  ibuprofen (ADVIL,MOTRIN) 200 MG tablet Take 600 mg by mouth every 6 (six) hours as needed for fever, headache or mild pain.    Historical Provider, MD  omeprazole (PRILOSEC) 20 MG capsule Take 20 mg by mouth daily.      Historical Provider, MD  traZODone (DESYREL) 50 MG tablet Take 50-100 mg by mouth at bedtime.    Historical Provider, MD    Family History Family History  Problem Relation Age of Onset  . Heart disease Father   . COPD Father   . Heart failure Maternal Grandmother   . Stroke Mother     Social History Social History  Substance Use Topics  . Smoking status: Former Smoker    Packs/day: 1.00    Years: 23.00    Types: Cigarettes    Quit date: 10/28/2014  .  Smokeless tobacco: Never Used  . Alcohol use No     Allergies   Iodinated diagnostic agents; Sulfa antibiotics; Dilaudid [hydromorphone hcl]; and Hydrocodone   Review of Systems Review of Systems  Constitutional: Positive for chills.  Respiratory: Positive for cough and chest tightness.   Musculoskeletal: Positive for myalgias.  All other systems reviewed and are negative.    Physical Exam Updated Vital Signs BP 153/69 (BP Location: Left Arm)   Pulse 85   Temp 98.4 F (36.9 C) (Oral)   Resp 20   Ht _0  (1.676 m)   Wt 81.6 kg   LMP  (LMP Unknown)   SpO2 97%   BMI 29.05 kg/m   Physical Exam  Constitutional: She is oriented to person, place, and time. She appears well-developed and well-nourished.  Non-toxic appearance.  HENT:  Head: Normocephalic.  Right Ear: Tympanic membrane and external ear normal.  Left Ear: Tympanic membrane and external ear normal.  Mild nasal congestion present.  Eyes: EOM and lids are normal. Pupils are equal, round, and reactive to light.  Neck: Normal range of motion. Neck supple. Carotid bruit is not present.  Cardiovascular: Normal rate, regular rhythm, intact distal pulses and normal pulses.   Murmur heard. Pulmonary/Chest: Breath sounds normal. No respiratory distress.  Mild congestion noted.  Soreness of the diaphragm area. No deformity noted.  Abdominal: Soft. Bowel sounds are normal. There is no tenderness. There is no guarding.  Musculoskeletal: Normal range of motion.  Lymphadenopathy:       Head (right side): No submandibular adenopathy present.       Head (left side): No submandibular adenopathy present.    She has no cervical adenopathy.  Neurological: She is alert and oriented to person, place, and time. She has normal strength. No cranial nerve deficit or sensory deficit.  Skin: Skin is warm and dry.  Psychiatric: She has a normal mood and affect. Her speech is normal.  Nursing note and vitals reviewed.    ED  Treatments / Results  Labs (all labs ordered are listed, but only abnormal results are displayed) Labs Reviewed  BASIC METABOLIC PANEL  TROPONIN I    EKG  EKG Interpretation  Date/Time:  Friday March 16 2016 11:37:41 EDT Ventricular Rate:  81 PR Interval:  158 QRS Duration: 86 QT Interval:  396 QTC Calculation: 460 R Axis:   54 Text Interpretation:  Normal sinus rhythm Right atrial enlargement Borderline ECG No significant change was found Confirmed by Wyvonnia Dusky  MD, STEPHEN ([id number]) on 03/16/2016 11:44:44 AM       Radiology No results found.  Procedures Procedures (including critical care time)  Medications  Ordered in ED Medications - No data to display   Initial Impression / Assessment and Plan / ED Course  I have reviewed the triage vital signs and the nursing notes.  Pertinent labs & imaging results that were available during my care of the patient were reviewed by me and considered in my medical decision making (see chart for details).  Clinical Course    *I have reviewed nursing notes, vital signs, and all appropriate lab and imaging results for this patient.**  Final Clinical Impressions(s) / ED Diagnoses  Vital signs within normal limits. Pulse oximetry is 96% on room air. Within normal limits by my interpretation.  Basic metabolic panel shows the potassium to be normal at 3.9, doubt the respiratory related problem is due to potassium issues. It is of note that the glucose is 221, this is where her glucose was recently when tested by her primary physician. Renal function is well within normal limits. The troponin is negative for acute event. The chest x-ray shows a prominent interstitial lung markings bilaterally, it is suspected that this may be related to atypical infection.  The patient was ambulated in the hall. There was no drop in the pulse oximetry will whatsoever. There was no increase in cough as a result of the exercise.  I discussed with the  patient in terms which he understands the findings. I've also discussed the vital signs on. And the x-ray results. The plan at this time is for the patient to be placed on Zithromax to cover atypical infection. The patient will also be given on medication for cough-Cheratussin. The patient is to return to the emergency department if any changes or problems before she is seen by her primary physician. The patient is in agreement with this discharge plan.    Final diagnoses:  None    New Prescriptions New Prescriptions   No medications on file     Lily Kocher, PA-C 03/16/16 [place], MD 03/16/16 [phone]

## 2016-03-16 NOTE — ED Triage Notes (Addendum)
Pt comes in with a non-productive cough starting 3 days ago. Lungs clear on triage, oxygen 100%    Pt is having left rib pain that started after cough and worsens with inspiration and movement.

## 2017-02-08 ENCOUNTER — Emergency Department (HOSPITAL_COMMUNITY): Payer: Medicare FFS

## 2017-02-08 ENCOUNTER — Emergency Department (HOSPITAL_COMMUNITY)
Admission: EM | Admit: 2017-02-08 | Discharge: 2017-02-08 | Disposition: A | Discharge status: Home / Self Care | Payer: Medicare FFS | Attending: Emergency Medicine | Admitting: Emergency Medicine

## 2017-02-08 ENCOUNTER — Encounter (HOSPITAL_COMMUNITY): Payer: Self-pay | Admitting: *Deleted

## 2017-02-08 DIAGNOSIS — R739 Hyperglycemia, unspecified: Secondary | ICD-10-CM

## 2017-02-08 DIAGNOSIS — E119 Type 2 diabetes mellitus without complications: Secondary | ICD-10-CM

## 2017-02-08 DIAGNOSIS — I110 Hypertensive heart disease with heart failure: Secondary | ICD-10-CM | POA: Diagnosis not present

## 2017-02-08 DIAGNOSIS — Z7982 Long term (current) use of aspirin: Secondary | ICD-10-CM | POA: Insufficient documentation

## 2017-02-08 DIAGNOSIS — E1165 Type 2 diabetes mellitus with hyperglycemia: Secondary | ICD-10-CM | POA: Diagnosis not present

## 2017-02-08 DIAGNOSIS — I509 Heart failure, unspecified: Secondary | ICD-10-CM | POA: Diagnosis not present

## 2017-02-08 DIAGNOSIS — Z79899 Other long term (current) drug therapy: Secondary | ICD-10-CM | POA: Insufficient documentation

## 2017-02-08 DIAGNOSIS — Z87891 Personal history of nicotine dependence: Secondary | ICD-10-CM | POA: Diagnosis not present

## 2017-02-08 DIAGNOSIS — Z8541 Personal history of malignant neoplasm of cervix uteri: Secondary | ICD-10-CM | POA: Diagnosis not present

## 2017-02-08 DIAGNOSIS — R0789 Other chest pain: Secondary | ICD-10-CM | POA: Diagnosis present

## 2017-02-08 HISTORY — DX: Type 2 diabetes mellitus without complications: E11.9

## 2017-02-08 LAB — COMPREHENSIVE METABOLIC PANEL WITH GFR
ALT: 61 U/L — ABNORMAL HIGH (ref 14–54)
AST: 58 U/L — ABNORMAL HIGH (ref 15–41)
Albumin: 3.9 g/dL (ref 3.5–5.0)
Alkaline Phosphatase: 160 U/L — ABNORMAL HIGH (ref 38–126)
Anion gap: 13 (ref 5–15)
BUN: 11 mg/dL (ref 6–20)
CO2: 21 mmol/L — ABNORMAL LOW (ref 22–32)
Calcium: 9.2 mg/dL (ref 8.9–10.3)
Chloride: 99 mmol/L — ABNORMAL LOW (ref 101–111)
Creatinine, Ser: 0.96 mg/dL (ref 0.44–1.00)
GFR calc Af Amer: >60 mL/min (ref >60)
GFR calc non Af Amer: >60 mL/min (ref >60)
Glucose, Bld: 414 mg/dL — ABNORMAL HIGH (ref 65–99)
Potassium: 4.7 mmol/L (ref 3.5–5.1)
Sodium: 133 mmol/L — ABNORMAL LOW (ref 135–145)
Total Bilirubin: 0.5 mg/dL (ref 0.3–1.2)
Total Protein: 7.9 g/dL (ref 6.5–8.1)

## 2017-02-08 LAB — CBC WITH DIFFERENTIAL/PLATELET
BASOS ABS: 0 10*3/uL (ref 0.0–0.1)
BASOS PCT: 0 %
Eosinophils Absolute: 0.3 10*3/uL (ref 0.0–0.7)
Eosinophils Relative: 4 %
HCT: 36.2 % (ref 36.0–46.0)
Hemoglobin: 12.2 g/dL (ref 12.0–15.0)
Lymphocytes Relative: 32 %
Lymphs Abs: 2.9 10*3/uL (ref 0.7–4.0)
MCH: 29.8 pg (ref 26.0–34.0)
MCHC: 33.7 g/dL (ref 30.0–36.0)
MCV: 88.3 fL (ref 78.0–100.0)
MONO ABS: 0.4 10*3/uL (ref 0.1–1.0)
Monocytes Relative: 5 %
NEUTROS ABS: 5.4 10*3/uL (ref 1.7–7.7)
Neutrophils Relative %: 59 %
PLATELETS: 314 10*3/uL (ref 150–400)
RBC: 4.1 MIL/uL (ref 3.87–5.11)
RDW: 12.9 % (ref 11.5–15.5)
WBC: 9 10*3/uL (ref 4.0–10.5)

## 2017-02-08 LAB — BLOOD GAS, VENOUS
ACID-BASE DEFICIT: 1.8 mmol/L (ref 0.0–2.0)
BICARBONATE: 23.2 mmol/L (ref 20.0–28.0)
FIO2: 0.21
O2 Saturation: 99.2 %
PATIENT TEMPERATURE: 37
pCO2, Ven: 34.7 mmHg — ABNORMAL LOW (ref 44.0–60.0)
pH, Ven: 7.419 (ref 7.250–7.430)
pO2, Ven: 171 mmHg — ABNORMAL HIGH (ref 32.0–45.0)

## 2017-02-08 LAB — CBG MONITORING, ED
GLUCOSE-CAPILLARY: 442 mg/dL — AB (ref 65–99)
Glucose-Capillary: 390 mg/dL — ABNORMAL HIGH (ref 65–99)
Glucose-Capillary: 284 mg/dL — ABNORMAL HIGH (ref 65–99)

## 2017-02-08 LAB — TROPONIN I: Troponin I: <0.03 ng/mL (ref <0.03)

## 2017-02-08 LAB — BETA-HYDROXYBUTYRIC ACID: Beta-Hydroxybutyric Acid: 0.07 mmol/L (ref 0.05–0.27)

## 2017-02-08 MED ORDER — SODIUM CHLORIDE 0.9 % IV BOLUS (SEPSIS)
500.0000 mL | Freq: Once | INTRAVENOUS | Status: AC
  Administered 2017-02-08: 500 mL via INTRAVENOUS

## 2017-02-08 MED ORDER — METFORMIN HCL 500 MG PO TABS
500.0000 mg | ORAL_TABLET | Freq: Two times a day (BID) | ORAL | 0 refills | Status: AC
Start: 2017-02-08 — End: ?

## 2017-02-08 MED ORDER — SODIUM CHLORIDE 0.9 % IV SOLN
INTRAVENOUS | Status: DC
  Administered 2017-02-08: 3.3 [IU]/h via INTRAVENOUS
  Filled 2017-02-08: qty 1

## 2017-02-08 MED ORDER — METFORMIN HCL 500 MG PO TABS
500.0000 mg | ORAL_TABLET | Freq: Once | ORAL | Status: AC
  Administered 2017-02-08: 500 mg via ORAL
  Filled 2017-02-08: qty 1

## 2017-02-08 NOTE — ED Provider Notes (Addendum)
[facility] DEPT Provider Note   CSN: [id number] Arrival date & time: 02/08/17  1609     History   Chief Complaint Chief Complaint  Patient presents with  . Hyperglycemia    HPI Holly Figueroa is a 51 y.o. female.  HPI 51 year old female presents today stating that she has new onset of diabetes with elevated blood sugar. She reports that she felt very lightheaded too weak today and fell backwards. She denies losing consciousness. She states that she has been having elevated blood sugars. She reports first episode of noting this was 8 months ago. She checked her blood sugar on her mother-in-law's blood sugar monitor. She reports that it has continued to go up. She lives in [place] and has seen an urgent care in [place]. She denies having primary care. She states that she is seen by cardiologist in [place] and had been told previously that her blood sugar had been elevated. He reports a headache or neck pain. She states that she has had some left anterior chest pain that has been present for 2 months. She denies any nausea, vomiting, diarrhea, fever, or chills. States she has been very thirsty and has been drinking a lot of fluid. She had she may have lost some weight. Past Medical History:  Diagnosis Date  . Acute respiratory failure ([place])   . Anemia 08-2008   Blood transfusion  . Anxiety   . Benzodiazepine dependence ([place])   . Cervical cancer ([place])   . CHF (congestive heart failure) ([place])   . Depression   . Diabetes mellitus without complication ([place])   . Headache(784.0)    migraines  . Hypertension   . Hypokalemia   . Legionella pneumonia ([place])   . Leukocytosis, unspecified   . Mitral stenosis   . Panic attacks   . Tobacco abuse     Patient Active Problem List   Diagnosis Date Noted  . ARDS (adult respiratory distress syndrome) ([place]) 10/30/2014  . Bilateral pneumonia 10/29/2014  . CAP (community acquired pneumonia) 10/29/2014  . Hypokalemia   . Hypoxia    . UTI (lower urinary tract infection)   . Tobacco abuse 10/11/2010  . Acute respiratory failure ([place]) 10/11/2010    Past Surgical History:  Procedure Laterality Date  . CARDIAC VALVE REPLACEMENT  2011   stretched mitral valve  . CERVICAL CONIZATION W/BX  2000  . CHOLECYSTECTOMY  06/26/2012   Procedure: LAPAROSCOPIC CHOLECYSTECTOMY WITH INTRAOPERATIVE CHOLANGIOGRAM;  Surgeon: Ralene Ok, MD;  Location: [facility];  Service: General;  Laterality: N/A;  . LASER ABLATION OF THE CERVIX    . MOLE REMOVAL    . NASAL SINUS SURGERY      OB History    No data available       Home Medications    Prior to Admission medications   Medication Sig Start Date End Date Taking? Authorizing Provider  acetaminophen (TYLENOL) 500 MG tablet Take 1,000 mg by mouth every 6 (six) hours as needed for mild pain or moderate pain.    [provider]  ALPRAZolam Duanne Moron) 1 MG tablet Take 0.5 tablets (0.5 mg total) by mouth 3 (three) times daily. anxiety Patient taking differently: Take 1 mg by mouth 3 (three) times daily. anxiety 11/08/14   Donita Brooks, NP  aspirin EC 81 MG tablet Take 81 mg by mouth daily.    [provider]  azithromycin (ZITHROMAX) 250 MG tablet 1 po daily with food 03/16/16   Lily Kocher, PA-C  FLUoxetine (PROZAC) 40 MG  capsule Take 40 mg by mouth 2 (two) times daily.     [provider]  furosemide (LASIX) 20 MG tablet Take 20 mg by mouth daily as needed for fluid.    [provider]  ibuprofen (ADVIL,MOTRIN) 200 MG tablet Take 600 mg by mouth every 6 (six) hours as needed for fever, headache or mild pain.    [provider]  omeprazole (PRILOSEC) 20 MG capsule Take 20 mg by mouth daily.      [provider]  promethazine-codeine (PHENERGAN WITH CODEINE) 6.25-10 MG/5ML syrup Take 10 mLs by mouth every 6 (six) hours as needed. 03/16/16   Lily Kocher, PA-C  traZODone (DESYREL) 50 MG tablet Take 50-100 mg by mouth at bedtime.     [provider]    Family History Family History  Problem Relation Age of Onset  . Heart disease Father   . COPD Father   . Heart failure Maternal Grandmother   . Stroke Mother     Social History Social History  Substance Use Topics  . Smoking status: Former Smoker    Packs/day: 1.00    Years: 23.00    Types: Cigarettes    Quit date: 10/28/2014  . Smokeless tobacco: Never Used  . Alcohol use No     Allergies   Iodinated diagnostic agents; Sulfa antibiotics; Dilaudid [hydromorphone hcl]; and Hydrocodone   Review of Systems Review of Systems  All other systems reviewed and are negative.    Physical Exam Updated Vital Signs BP 121/76   Pulse 91   Temp 98.1 F (36.7 C)   Resp 20   Ht 1.676 m (_0 )   Wt 81.6 kg (180 lb)   SpO2 98%   BMI 29.05 kg/m   Physical Exam  Constitutional: She is oriented to person, place, and time. She appears well-developed and well-nourished.  HENT:  Head: Normocephalic and atraumatic.  Eyes: Pupils are equal, round, and reactive to light.  Neck: Normal range of motion. Neck supple.  Cardiovascular: Normal rate, regular rhythm, normal heart sounds and intact distal pulses.   Pulmonary/Chest: Effort normal and breath sounds normal.  Abdominal: Soft. Bowel sounds are normal.  Musculoskeletal: Normal range of motion.  Neurological: She is alert and oriented to person, place, and time. She displays normal reflexes. No cranial nerve deficit. Coordination normal.  Skin: Skin is warm and dry. Capillary refill takes less than 2 seconds.  Psychiatric: She has a normal mood and affect.  Nursing note and vitals reviewed.    ED Treatments / Results  Labs (all labs ordered are listed, but only abnormal results are displayed) Labs Reviewed  CBG MONITORING, ED - Abnormal; Notable for the following:       Result Value   Glucose-Capillary 442 (*)    All other components within normal limits  BLOOD GAS, VENOUS  CBC WITH  DIFFERENTIAL/PLATELET  BETA-HYDROXYBUTYRIC ACID  URINALYSIS, ROUTINE W REFLEX MICROSCOPIC  COMPREHENSIVE METABOLIC PANEL  CBG MONITORING, ED    EKG  EKG Interpretation  Date/Time:  Friday February 08 2017 17:23:44 EDT Ventricular Rate:  80 PR Interval:    QRS Duration: 80 QT Interval:  385 QTC Calculation: 445 R Axis:   35 Text Interpretation:  Normal sinus rhythm No significant change since last tracing Confirmed by Pattricia Boss [phone]) on 02/08/2017 6:25:23 PM       Radiology Dg Chest 2 View  Result Date: 02/08/2017 CLINICAL DATA:  Shortness of breath with central chest pain EXAM: CHEST  2 VIEW  COMPARISON:  07/06/2016 FINDINGS: No focal infiltrate or effusion. Mild cardiomegaly with slight central vascular congestion. No pneumothorax. IMPRESSION: Mild cardiomegaly with slight central congestion. No overt edema or focal infiltrates. Electronically Signed   By: Donavan Foil M.D.   On: 02/08/2017 18:11    Procedures Procedures (including critical care time)  Medications Ordered in ED Medications  sodium chloride 0.9 % bolus 500 mL (not administered)  insulin regular (NOVOLIN R,HUMULIN R) 100 Units in sodium chloride 0.9 % 100 mL (1 Units/mL) infusion (not administered)     Initial Impression / Assessment and Plan / ED Course  I have reviewed the triage vital signs and the nursing notes.  Pertinent labs & imaging results that were available during my care of the patient were reviewed by me and considered in my medical decision making (see chart for details).     Patient with new onset diabetes without evidence of dka- patient with bs now at 221.  Meformin started and patient advised regarding follow up. Lightheaded earlier today and near syncope.  No evidence of ischemia on ekg or troponin.  Patient advised re follow up and return precautions.   Reviewed metformin precautions/contraindications.  Provided patient with Up to Date info for metformin and new onset type 2 dm  info.  Final Clinical Impressions(s) / ED Diagnoses   Final diagnoses:  Hyperglycemia  New onset type 2 diabetes mellitus (HCC)    New Prescriptions New Prescriptions   METFORMIN (GLUCOPHAGE) 500 MG TABLET    Take 1 tablet (500 mg total) by mouth 2 (two) times daily with a meal.     Pattricia Boss, MD 02/08/17 1944    Pattricia Boss, MD 02/08/17 [id number]

## 2017-02-08 NOTE — ED Notes (Signed)
ED Provider at bedside.

## 2017-02-08 NOTE — Discharge Instructions (Signed)
Read up to date information regarding new type 2 diabetes.  Call the number provided to obtain recheck within 2 weeks.  Return if you are worse at any time.

## 2017-02-08 NOTE — ED Triage Notes (Signed)
States she fell today at home and hit the back of her head and back area. States her blood sugar is running high, has not been diagnosed with diabetes

## 2017-02-08 NOTE — ED Notes (Signed)
CRITICAL VALUE ALERT  Critical Value: cbg 442  Date & Time Notied: 02/08/2017 _0  1655   Provider Notified: RAY  Orders Received/Actions taken: ORDERS RECEIVED

## 2017-02-13 ENCOUNTER — Other Ambulatory Visit: Payer: Self-pay | Admitting: Adult Health

## 2017-06-11 ENCOUNTER — Encounter (HOSPITAL_COMMUNITY): Payer: Self-pay

## 2017-06-11 ENCOUNTER — Observation Stay (HOSPITAL_COMMUNITY)
Admission: EM | Admit: 2017-06-11 | Discharge: 2017-06-13 | Disposition: A | Discharge status: Home / Self Care | Payer: Medicare FFS | Attending: Internal Medicine | Admitting: Internal Medicine

## 2017-06-11 ENCOUNTER — Emergency Department (HOSPITAL_COMMUNITY): Payer: Medicare FFS

## 2017-06-11 ENCOUNTER — Other Ambulatory Visit: Payer: Self-pay

## 2017-06-11 DIAGNOSIS — Z79899 Other long term (current) drug therapy: Secondary | ICD-10-CM | POA: Insufficient documentation

## 2017-06-11 DIAGNOSIS — E876 Hypokalemia: Secondary | ICD-10-CM | POA: Insufficient documentation

## 2017-06-11 DIAGNOSIS — R0609 Other forms of dyspnea: Secondary | ICD-10-CM | POA: Diagnosis not present

## 2017-06-11 DIAGNOSIS — F329 Major depressive disorder, single episode, unspecified: Secondary | ICD-10-CM | POA: Diagnosis not present

## 2017-06-11 DIAGNOSIS — K921 Melena: Secondary | ICD-10-CM

## 2017-06-11 DIAGNOSIS — F410 Panic disorder [episodic paroxysmal anxiety] without agoraphobia: Secondary | ICD-10-CM | POA: Insufficient documentation

## 2017-06-11 DIAGNOSIS — R1310 Dysphagia, unspecified: Secondary | ICD-10-CM | POA: Diagnosis not present

## 2017-06-11 DIAGNOSIS — K640 First degree hemorrhoids: Secondary | ICD-10-CM | POA: Diagnosis not present

## 2017-06-11 DIAGNOSIS — J849 Interstitial pulmonary disease, unspecified: Secondary | ICD-10-CM | POA: Insufficient documentation

## 2017-06-11 DIAGNOSIS — R1319 Other dysphagia: Secondary | ICD-10-CM

## 2017-06-11 DIAGNOSIS — I110 Hypertensive heart disease with heart failure: Secondary | ICD-10-CM | POA: Insufficient documentation

## 2017-06-11 DIAGNOSIS — D649 Anemia, unspecified: Principal | ICD-10-CM | POA: Insufficient documentation

## 2017-06-11 DIAGNOSIS — Z7984 Long term (current) use of oral hypoglycemic drugs: Secondary | ICD-10-CM | POA: Diagnosis not present

## 2017-06-11 DIAGNOSIS — N390 Urinary tract infection, site not specified: Secondary | ICD-10-CM | POA: Insufficient documentation

## 2017-06-11 DIAGNOSIS — E119 Type 2 diabetes mellitus without complications: Secondary | ICD-10-CM | POA: Insufficient documentation

## 2017-06-11 DIAGNOSIS — Z952 Presence of prosthetic heart valve: Secondary | ICD-10-CM | POA: Diagnosis not present

## 2017-06-11 DIAGNOSIS — Z7982 Long term (current) use of aspirin: Secondary | ICD-10-CM | POA: Diagnosis not present

## 2017-06-11 DIAGNOSIS — Z87891 Personal history of nicotine dependence: Secondary | ICD-10-CM | POA: Diagnosis not present

## 2017-06-11 DIAGNOSIS — R51 Headache: Secondary | ICD-10-CM | POA: Insufficient documentation

## 2017-06-11 DIAGNOSIS — I5033 Acute on chronic diastolic (congestive) heart failure: Secondary | ICD-10-CM

## 2017-06-11 DIAGNOSIS — D539 Nutritional anemia, unspecified: Secondary | ICD-10-CM | POA: Diagnosis present

## 2017-06-11 DIAGNOSIS — K210 Gastro-esophageal reflux disease with esophagitis: Secondary | ICD-10-CM | POA: Diagnosis not present

## 2017-06-11 DIAGNOSIS — I5032 Chronic diastolic (congestive) heart failure: Secondary | ICD-10-CM | POA: Insufficient documentation

## 2017-06-11 DIAGNOSIS — K449 Diaphragmatic hernia without obstruction or gangrene: Secondary | ICD-10-CM | POA: Insufficient documentation

## 2017-06-11 DIAGNOSIS — E785 Hyperlipidemia, unspecified: Secondary | ICD-10-CM | POA: Diagnosis not present

## 2017-06-11 DIAGNOSIS — D500 Iron deficiency anemia secondary to blood loss (chronic): Secondary | ICD-10-CM

## 2017-06-11 LAB — CBG MONITORING, ED: GLUCOSE-CAPILLARY: 162 mg/dL — AB (ref 65–99)

## 2017-06-11 LAB — BASIC METABOLIC PANEL
Anion gap: 11 (ref 5–15)
BUN: 9 mg/dL (ref 6–20)
CHLORIDE: 105 mmol/L (ref 101–111)
CO2: 20 mmol/L — ABNORMAL LOW (ref 22–32)
CREATININE: 0.81 mg/dL (ref 0.44–1.00)
Calcium: 9 mg/dL (ref 8.9–10.3)
GFR calc Af Amer: >60 mL/min (ref >60)
GFR calc non Af Amer: >60 mL/min (ref >60)
GLUCOSE: 162 mg/dL — AB (ref 65–99)
Potassium: 4.2 mmol/L (ref 3.5–5.1)
SODIUM: 136 mmol/L (ref 135–145)

## 2017-06-11 LAB — URINALYSIS, ROUTINE W REFLEX MICROSCOPIC
Bilirubin Urine: NEGATIVE
GLUCOSE, UA: NEGATIVE mg/dL
HGB URINE DIPSTICK: NEGATIVE
Ketones, ur: NEGATIVE mg/dL
Nitrite: POSITIVE — AB
Protein, ur: NEGATIVE mg/dL
SPECIFIC GRAVITY, URINE: 1.015 (ref 1.005–1.030)
pH: 5 (ref 5.0–8.0)

## 2017-06-11 LAB — CBC WITH DIFFERENTIAL/PLATELET
Basophils Absolute: 0.1 10*3/uL (ref 0.0–0.1)
Basophils Relative: 1 %
EOS ABS: 0.4 10*3/uL (ref 0.0–0.7)
EOS PCT: 4 %
HCT: 26 % — ABNORMAL LOW (ref 36.0–46.0)
HEMOGLOBIN: 7.9 g/dL — AB (ref 12.0–15.0)
LYMPHS ABS: 2.1 10*3/uL (ref 0.7–4.0)
Lymphocytes Relative: 22 %
MCH: 27.1 pg (ref 26.0–34.0)
MCHC: 30.4 g/dL (ref 30.0–36.0)
MCV: 89 fL (ref 78.0–100.0)
MONOS PCT: 4 %
Monocytes Absolute: 0.4 10*3/uL (ref 0.1–1.0)
NEUTROS PCT: 69 %
Neutro Abs: 6.5 10*3/uL (ref 1.7–7.7)
Platelets: 375 10*3/uL (ref 150–400)
RBC: 2.92 MIL/uL — ABNORMAL LOW (ref 3.87–5.11)
RDW: 14.5 % (ref 11.5–15.5)
WBC: 9.4 10*3/uL (ref 4.0–10.5)

## 2017-06-11 LAB — HEPATIC FUNCTION PANEL
ALT: 24 U/L (ref 14–54)
AST: 23 U/L (ref 15–41)
Albumin: 3.5 g/dL (ref 3.5–5.0)
Alkaline Phosphatase: 85 U/L (ref 38–126)
BILIRUBIN DIRECT: 0.1 mg/dL (ref 0.1–0.5)
BILIRUBIN TOTAL: 0.4 mg/dL (ref 0.3–1.2)
Indirect Bilirubin: 0.3 mg/dL (ref 0.3–0.9)
Total Protein: 7.2 g/dL (ref 6.5–8.1)

## 2017-06-11 LAB — RETICULOCYTES
RBC.: 2.95 MIL/uL — ABNORMAL LOW (ref 3.87–5.11)
Retic Count, Absolute: 123.9 10*3/uL (ref 19.0–186.0)
Retic Ct Pct: 4.2 % — ABNORMAL HIGH (ref 0.4–3.1)

## 2017-06-11 LAB — GLUCOSE, CAPILLARY
Glucose-Capillary: 161 mg/dL — ABNORMAL HIGH (ref 65–99)
Glucose-Capillary: 161 mg/dL — ABNORMAL HIGH (ref 65–99)

## 2017-06-11 LAB — PROTIME-INR
INR: 1.08
Prothrombin Time: 13.9 seconds (ref 11.4–15.2)

## 2017-06-11 LAB — IRON AND TIBC
IRON: 25 ug/dL — AB (ref 28–170)
SATURATION RATIOS: 6 % — AB (ref 10.4–31.8)
TIBC: 444 ug/dL (ref 250–450)
UIBC: 419 ug/dL

## 2017-06-11 LAB — VITAMIN B12: VITAMIN B 12: 252 pg/mL (ref 180–914)

## 2017-06-11 LAB — POC OCCULT BLOOD, ED: FECAL OCCULT BLD: NEGATIVE

## 2017-06-11 LAB — PREPARE RBC (CROSSMATCH)

## 2017-06-11 LAB — ABO/RH: ABO/RH(D): O POS

## 2017-06-11 LAB — APTT: aPTT: 34 seconds (ref 24–36)

## 2017-06-11 LAB — FERRITIN: Ferritin: 63 ng/mL (ref 11–307)

## 2017-06-11 MED ORDER — TRAZODONE HCL 50 MG PO TABS
100.0000 mg | ORAL_TABLET | Freq: Every day | ORAL | Status: DC
  Administered 2017-06-11 – 2017-06-12 (×2): 100 mg via ORAL
  Filled 2017-06-11 (×2): qty 2

## 2017-06-11 MED ORDER — ACETAMINOPHEN 325 MG PO TABS
650.0000 mg | ORAL_TABLET | Freq: Four times a day (QID) | ORAL | Status: DC | PRN
  Administered 2017-06-11 – 2017-06-12 (×2): 650 mg via ORAL
  Filled 2017-06-11 (×2): qty 2

## 2017-06-11 MED ORDER — SODIUM CHLORIDE 0.9 % IV SOLN
Freq: Once | INTRAVENOUS | Status: AC
  Administered 2017-06-11: 19:00:00 via INTRAVENOUS

## 2017-06-11 MED ORDER — HYDRALAZINE HCL 20 MG/ML IJ SOLN: 10.0000 mg | Freq: Three times a day (TID) | INTRAMUSCULAR | Status: DC | PRN

## 2017-06-11 MED ORDER — PANTOPRAZOLE SODIUM 40 MG PO TBEC
40.0000 mg | DELAYED_RELEASE_TABLET | Freq: Every day | ORAL | Status: DC
  Administered 2017-06-11 – 2017-06-13 (×3): 40 mg via ORAL
  Filled 2017-06-11 (×3): qty 1

## 2017-06-11 MED ORDER — NICOTINE 7 MG/24HR TD PT24
7.0000 mg | MEDICATED_PATCH | Freq: Every day | TRANSDERMAL | Status: DC
  Administered 2017-06-11 – 2017-06-13 (×3): 7 mg via TRANSDERMAL
  Filled 2017-06-11 (×3): qty 1

## 2017-06-11 MED ORDER — PANTOPRAZOLE SODIUM 40 MG PO TBEC: 40.0000 mg | DELAYED_RELEASE_TABLET | Freq: Every day | ORAL | Status: DC

## 2017-06-11 MED ORDER — DEXTROSE 5 % IV SOLN
1.0000 g | INTRAVENOUS | Status: DC
  Administered 2017-06-11 – 2017-06-12 (×2): 1 g via INTRAVENOUS
  Filled 2017-06-11 (×4): qty 10

## 2017-06-11 MED ORDER — PEG 3350-KCL-NA BICARB-NACL 420 G PO SOLR
2000.0000 mL | Freq: Once | ORAL | Status: AC
  Administered 2017-06-11: 2000 mL via ORAL
  Filled 2017-06-11: qty 4000

## 2017-06-11 MED ORDER — FLUOXETINE HCL 20 MG PO CAPS
80.0000 mg | ORAL_CAPSULE | Freq: Every day | ORAL | Status: DC
  Administered 2017-06-11 – 2017-06-13 (×3): 80 mg via ORAL
  Filled 2017-06-11 (×3): qty 4

## 2017-06-11 MED ORDER — FUROSEMIDE 10 MG/ML IJ SOLN
20.0000 mg | Freq: Once | INTRAMUSCULAR | Status: AC
  Administered 2017-06-11: 20 mg via INTRAVENOUS
  Filled 2017-06-11: qty 2

## 2017-06-11 MED ORDER — PEG 3350-KCL-NA BICARB-NACL 420 G PO SOLR
2000.0000 mL | Freq: Once | ORAL | Status: AC
  Administered 2017-06-12: 2000 mL via ORAL

## 2017-06-11 MED ORDER — FUROSEMIDE 20 MG PO TABS: 20.0000 mg | ORAL_TABLET | Freq: Every day | ORAL | Status: DC | PRN

## 2017-06-11 MED ORDER — ALPRAZOLAM 1 MG PO TABS
1.0000 mg | ORAL_TABLET | Freq: Three times a day (TID) | ORAL | Status: DC | PRN
  Administered 2017-06-11 – 2017-06-13 (×4): 1 mg via ORAL
  Filled 2017-06-11: qty 2
  Filled 2017-06-11: qty 1
  Filled 2017-06-11: qty 2
  Filled 2017-06-11: qty 1

## 2017-06-11 MED ORDER — INSULIN ASPART 100 UNIT/ML ~~LOC~~ SOLN
0.0000 [IU] | Freq: Three times a day (TID) | SUBCUTANEOUS | Status: DC
  Administered 2017-06-11 – 2017-06-13 (×4): 3 [IU] via SUBCUTANEOUS

## 2017-06-11 MED ORDER — ROSUVASTATIN CALCIUM 10 MG PO TABS
10.0000 mg | ORAL_TABLET | Freq: Every day | ORAL | Status: DC
  Administered 2017-06-11 – 2017-06-13 (×3): 10 mg via ORAL
  Filled 2017-06-11 (×3): qty 1

## 2017-06-11 NOTE — H&P (Signed)
[facility] History and Physical  Holly Figueroa QJF:[id number] DOB: 15-Apr-1966 DOA: 06/11/2017  Referring physician:  PCP: Patient, No Pcp Per   Chief Complaint: "I was just tired."  HPI: Holly Figueroa is a 51 y.o. female with past medical history significant for recent hospitalization for pneumonia, severe anxiety, history of blood transfusion, migraines migraines presents the emergency room with chief complaint of fatigue. Presents to emergency room with chief complaint of fatigue.  Patient states that she has not seen any  hematuria hematuria or hematochezia.  She has progressively become more weak over the last few months. She states that she is dyspneic when she walks across the room.   Normally takes a daily aspirin.  Brother died of colon cancer 49 Pt has not had colonoscopy   Review of Systems:  As per HPI otherwise 10 point review of systems negative.    Past Medical History:  Diagnosis Date  . Acute respiratory failure ([place])   . Anemia 08-2008   Blood transfusion  . Anxiety   . Benzodiazepine dependence ([place])   . Cervical cancer ([place])   . CHF (congestive heart failure) ([place])   . Depression   . Diabetes mellitus without complication ([place])   . Headache(784.0)    migraines  . Hypokalemia   . Legionella pneumonia ([place])   . Leukocytosis, unspecified   . Mitral stenosis   . Panic attacks   . Tobacco abuse    Past Surgical History:  Procedure Laterality Date  . CARDIAC VALVE REPLACEMENT  2011   stretched mitral valve  . CERVICAL CONIZATION W/BX  2000  . CHOLECYSTECTOMY  06/26/2012   Procedure: LAPAROSCOPIC CHOLECYSTECTOMY WITH INTRAOPERATIVE CHOLANGIOGRAM;  Surgeon: Ralene Ok, MD;  Location: [facility];  Service: General;  Laterality: N/A;  . LASER ABLATION OF THE CERVIX    . MOLE REMOVAL    . NASAL SINUS SURGERY     Social History:  reports that she quit smoking about 2 years ago. Her smoking use included cigarettes. She has a 23.00 pack-year  smoking history. she has never used smokeless tobacco. She reports that she does not drink alcohol or use drugs.  Allergies  Allergen Reactions  . Iodinated Diagnostic Agents Anaphylaxis  . Sulfa Antibiotics Hives  . Dilaudid [Hydromorphone Hcl] Itching and Palpitations  . Hydrocodone Palpitations    Family History  Problem Relation Age of Onset  . Heart disease Father   . COPD Father   . Heart failure Maternal Grandmother   . Stroke Mother      Prior to Admission medications   Medication Sig Start Date End Date Taking? Authorizing Provider  acetaminophen (TYLENOL) 500 MG tablet Take 1,000 mg by mouth every 6 (six) hours as needed for mild pain or moderate pain.   Yes [provider]  ALPRAZolam Duanne Moron) 1 MG tablet Take 0.5 tablets (0.5 mg total) by mouth 3 (three) times daily. anxiety Patient taking differently: Take 1 mg by mouth 3 (three) times daily. anxiety 11/08/14  Yes Ollis, Velna Hatchet L, NP  aspirin EC 81 MG tablet Take 81 mg by mouth daily.   Yes [provider]  FLUoxetine (PROZAC) 40 MG capsule Take 80 mg by mouth daily.    Yes [provider]  furosemide (LASIX) 20 MG tablet Take 20 mg by mouth daily as needed for fluid.   Yes [provider]  ibuprofen (ADVIL,MOTRIN) 200 MG tablet Take 600 mg by mouth every 6 (six) hours as needed for fever, headache or mild pain.  Yes [provider]  metFORMIN (GLUCOPHAGE) 500 MG tablet Take 1 tablet (500 mg total) by mouth 2 (two) times daily with a meal. 02/08/17  Yes Pattricia Boss, MD  omeprazole (PRILOSEC) 20 MG capsule Take 20 mg by mouth daily.     Yes [provider]  Phenyleph-Doxylamine-DM-APAP (NYQUIL SEVERE COLD/FLU) 5-6.25-10-325 MG/15ML LIQD Take 15 mLs by mouth at bedtime as needed.   Yes [provider]  potassium chloride SA (K-DUR,KLOR-CON) 20 MEQ tablet Take 1 tablet by mouth daily as needed.  12/04/10  Yes [provider]  promethazine (PHENERGAN) 25 MG  tablet Take 25 mg by mouth every 6 (six) hours as needed. 08/27/10  Yes [provider]  rosuvastatin (CRESTOR) 10 MG tablet Take 10 mg by mouth daily. 04/15/17  Yes [provider]  traZODone (DESYREL) 50 MG tablet Take 100 mg by mouth at bedtime.    Yes [provider]  amoxicillin (AMOXIL) 500 MG capsule Take 1 capsule by mouth daily. 12/04/10   [provider]  azithromycin (ZITHROMAX) 250 MG tablet 1 po daily with food Patient not taking: Reported on 06/11/2017 03/16/16   Lily Kocher, PA-C   Physical Exam: Vitals:   06/11/17 1314 06/11/17 1416 06/11/17 1530 06/11/17 1545  BP:  109/64 110/66   Pulse:  67 88 89  Resp:  19    Temp:      TempSrc:      SpO2:  97% 96% 94%  Weight: 80.7 kg (178 lb)     Height: _0  (1.676 m)       Wt Readings from Last 3 Encounters:  06/11/17 80.7 kg (178 lb)  02/08/17 81.6 kg (180 lb)  03/16/16 81.6 kg (180 lb)    General:  Appears calm and comfortable; a&Ox3 Eyes:  PERRL, EOMI, normal lids, iris ENT:  grossly normal hearing, lips & tongue Neck:  no LAD, masses or thyromegaly Cardiovascular:  RRR, no m/r/g. No LE edema.  Respiratory:  CTA bilaterally, no w/r/r. Normal respiratory effort. Abdomen:  soft, ntnd Skin:  no rash or induration seen on limited exam Musculoskeletal:  grossly normal tone BUE/BLE Psychiatric:  grossly normal mood and affect, speech fluent and appropriate Neurologic:  CN 2-12 grossly intact, moves all extremities in coordinated fashion.          Labs on Admission:  Basic Metabolic Panel: Recent Labs  Lab 06/11/17 1340  NA 136  K 4.2  CL 105  CO2 20*  GLUCOSE 162*  BUN 9  CREATININE 0.81  CALCIUM 9.0   Liver Function Tests: No results for input(s): AST, ALT, ALKPHOS, BILITOT, PROT, ALBUMIN in the last 168 hours. No results for input(s): LIPASE, AMYLASE in the last 168 hours. No results for input(s): AMMONIA in the last 168 hours. CBC: Recent Labs  Lab 06/11/17 1340    WBC 9.4  NEUTROABS 6.5  HGB 7.9*  HCT 26.0*  MCV 89.0  PLT 375   Cardiac Enzymes: No results for input(s): CKTOTAL, CKMB, CKMBINDEX, TROPONINI in the last 168 hours.  BNP (last 3 results) No results for input(s): BNP in the last 8760 hours.  ProBNP (last 3 results) No results for input(s): PROBNP in the last 8760 hours.   Serum creatinine: 0.81 mg/dL 06/11/17 1340 Estimated creatinine clearance: 89.1 mL/min  CBG: Recent Labs  Lab 06/11/17 1400  GLUCAP 162*    Radiological Exams on Admission: Dg Chest 2 View  Result Date: 06/11/2017 CLINICAL DATA:  Cough, shortness of Breath EXAM: CHEST  2  VIEW COMPARISON:  02/08/2017 FINDINGS: Diffuse interstitial prominence throughout the lungs, likely chronic interstitial lung disease. Mild peribronchial thickening. Heart is normal size. No effusions or acute bony abnormality. IMPRESSION: Mild peribronchial thickening and interstitial prominence, favor chronic interstitial lung disease. Electronically Signed   By: Rolm Baptise M.D.   On: 06/11/2017 14:32    EKG: Independently reviewed. No stemi.  Assessment/Plan Active Problems:   Symptomatic anemia   Symptomatic Anemia Most recent hemoglobin Pos History of anemia Serial H&H Give 2u prbcs IV fluids History of bleed none GI consult not ordered Avoid NSAIDs Hold the following medications asa Anemia panel  DM SSI AC Hold oral DM meds  CHF Cont lasix  Tobacco abuse Cont nicotine patch  Hyperlipidemia Continue statin  GERD Cont PPI  Depression w/ anxiety No SI/HI Prn xanax Cont prozac, desyrel    Code Status: FC  DVT Prophylaxis: SCDs Family Communication: wife has called husband Disposition Plan: Pending Improvement  Status: obs medsurg  Elwin Mocha, MD Family Medicine [facility] [email or web address] Password [email or web address]

## 2017-06-11 NOTE — ED Triage Notes (Signed)
Pt reports she was in hospital in [place] with double pneumonia approx 1 month ago.   Reports still has a cough, headache, and gets sob with exertion.  Denies fever.

## 2017-06-11 NOTE — Consult Note (Signed)
Referring Provider: Dr. Sabra Heck  Primary Care Physician:  Patient, No Pcp Per Primary Gastroenterologist:  Dr. Oneida Alar   Date of Admission: 06/11/17 Date of Consultation: 06/11/17  Reason for Consultation:  Symptomatic anemia   HPI:  Holly Figueroa is a 51 y.o. year old female admitted with symptomatic anemia, Hgb 7.9 on admission. 4 months ago in epic noted to be 12.2. However, she was inpatient Nov 11-May 09, 2017 in [place] for pneumonia. Review of care everywhere shows her admitting Hgb in Nov was 10.2, with discharge Hgb in the 8 range. Normocytic anemia.   She notes that since discharge in Nov 2018, she has noticed weakness, dyspnea on exertion, fatigue, and wondered if she had recurrent pneumonia, prompting ED presentation. She notes vague lower abdominal discomfort described as a burning for the past 2 months, no aggravating or relieving factors. Notes several months of bright red blood per rectum with wiping but no melena. States her brother passed away at age 12 from colon cancer, so she had been monitoring the rectal bleeding and was looking for dark, tarry stool. She notes soft stools approximately 3-4 times per day but no diarrhea. Nausea noted with headache. Otherwise, no vomiting. No significant upper GI symptoms. No dysphagia. Takes Ibuprofen 600 mg for headaches. For past month has been taking 325 mg aspirin daily. No prior colonoscopy or upper endoscopy.   Past Medical History:  Diagnosis Date  . Acute respiratory failure ([place])   . Anemia 08-2008   Blood transfusion  . Anxiety   . Benzodiazepine dependence ([place])   . Cervical cancer ([place])   . CHF (congestive heart failure) ([place])   . Depression   . Diabetes mellitus without complication ([place])   . Headache(784.0)    migraines  . Hypokalemia   . Legionella pneumonia ([place])   . Leukocytosis, unspecified   . Mitral stenosis   . Panic attacks   . Tobacco abuse     Past Surgical History:  Procedure Laterality  Date  . CARDIAC VALVE REPLACEMENT  2011   stretched mitral valve  . CERVICAL CONIZATION W/BX  2000  . CHOLECYSTECTOMY  06/26/2012   Procedure: LAPAROSCOPIC CHOLECYSTECTOMY WITH INTRAOPERATIVE CHOLANGIOGRAM;  Surgeon: Ralene Ok, MD;  Location: [facility];  Service: General;  Laterality: N/A;  . LASER ABLATION OF THE CERVIX    . MOLE REMOVAL    . NASAL SINUS SURGERY      Prior to Admission medications   Medication Sig Start Date End Date Taking? Authorizing Provider  acetaminophen (TYLENOL) 500 MG tablet Take 1,000 mg by mouth every 6 (six) hours as needed for mild pain or moderate pain.   Yes [provider]  ALPRAZolam Duanne Moron) 1 MG tablet Take 0.5 tablets (0.5 mg total) by mouth 3 (three) times daily. anxiety Patient taking differently: Take 1 mg by mouth 3 (three) times daily. anxiety 11/08/14  Yes Ollis, Velna Hatchet L, NP  aspirin EC 81 MG tablet Take 81 mg by mouth daily.   Yes [provider]  FLUoxetine (PROZAC) 40 MG capsule Take 80 mg by mouth daily.    Yes [provider]  furosemide (LASIX) 20 MG tablet Take 20 mg by mouth daily as needed for fluid.   Yes [provider]  ibuprofen (ADVIL,MOTRIN) 200 MG tablet Take 600 mg by mouth every 6 (six) hours as needed for fever, headache or mild pain.   Yes [provider]  metFORMIN (GLUCOPHAGE) 500 MG tablet Take 1 tablet (500 mg total) by  mouth 2 (two) times daily with a meal. 02/08/17  Yes Ray, Andee Poles, MD  omeprazole (PRILOSEC) 20 MG capsule Take 20 mg by mouth daily.     Yes [provider]  Phenyleph-Doxylamine-DM-APAP (NYQUIL SEVERE COLD/FLU) 5-6.25-10-325 MG/15ML LIQD Take 15 mLs by mouth at bedtime as needed.   Yes [provider]  potassium chloride SA (K-DUR,KLOR-CON) 20 MEQ tablet Take 1 tablet by mouth daily as needed.  12/04/10  Yes [provider]  promethazine (PHENERGAN) 25 MG tablet Take 25 mg by mouth every 6 (six) hours as needed. 08/27/10  Yes [provider]  rosuvastatin (CRESTOR) 10 MG tablet Take 10 mg by mouth daily. 04/15/17  Yes [provider]  traZODone (DESYREL) 50 MG tablet Take 100 mg by mouth at bedtime.    Yes [provider]  amoxicillin (AMOXIL) 500 MG capsule Take 1 capsule by mouth daily. 12/04/10   [provider]  azithromycin (ZITHROMAX) 250 MG tablet 1 po daily with food Patient not taking: Reported on 06/11/2017 03/16/16   Lily Kocher, PA-C    Current Facility-Administered Medications  Medication Dose Route Frequency Provider Last Rate Last Dose  . 0.9 %  sodium chloride infusion   Intravenous Once Elwin Mocha, MD      . ALPRAZolam Duanne Moron) tablet 1 mg  1 mg Oral TID PRN Elwin Mocha, MD      . FLUoxetine (PROZAC) capsule 80 mg  80 mg Oral Daily Elwin Mocha, MD      . furosemide (LASIX) injection 20 mg  20 mg Intravenous Once Elwin Mocha, MD      . furosemide (LASIX) tablet 20 mg  20 mg Oral Daily PRN Elwin Mocha, MD      . hydrALAZINE (APRESOLINE) injection 10 mg  10 mg Intravenous Q8H PRN Elwin Mocha, MD      . insulin aspart (novoLOG) injection 0-15 Units  0-15 Units Subcutaneous TID WC Elwin Mocha, MD      . pantoprazole (PROTONIX) EC tablet 40 mg  40 mg Oral Daily Elwin Mocha, MD      . rosuvastatin (CRESTOR) tablet 10 mg  10 mg Oral Daily Elwin Mocha, MD      . traZODone (DESYREL) tablet 100 mg  100 mg Oral QHS Elwin Mocha, MD       Current Outpatient Medications  Medication Sig Dispense Refill  . acetaminophen (TYLENOL) 500 MG tablet Take 1,000 mg by mouth every 6 (six) hours as needed for mild pain or moderate pain.    Marland Kitchen ALPRAZolam (XANAX) 1 MG tablet Take 0.5 tablets (0.5 mg total) by mouth 3 (three) times daily. anxiety (Patient taking differently: Take 1 mg by mouth 3 (three) times daily. anxiety) 30 tablet 0  . aspirin EC 81 MG tablet Take 81 mg by mouth daily.    Marland Kitchen FLUoxetine (PROZAC) 40 MG capsule Take 80 mg by mouth  daily.     . furosemide (LASIX) 20 MG tablet Take 20 mg by mouth daily as needed for fluid.    Marland Kitchen ibuprofen (ADVIL,MOTRIN) 200 MG tablet Take 600 mg by mouth every 6 (six) hours as needed for fever, headache or mild pain.    . metFORMIN (GLUCOPHAGE) 500 MG tablet Take 1 tablet (500 mg total) by mouth 2 (two) times daily with a meal. 60 tablet 0  . omeprazole (PRILOSEC) 20 MG capsule Take 20 mg by mouth daily.      Marland Kitchen Phenyleph-Doxylamine-DM-APAP (NYQUIL  SEVERE COLD/FLU) 5-6.25-10-325 MG/15ML LIQD Take 15 mLs by mouth at bedtime as needed.    . potassium chloride SA (K-DUR,KLOR-CON) 20 MEQ tablet Take 1 tablet by mouth daily as needed.     . promethazine (PHENERGAN) 25 MG tablet Take 25 mg by mouth every 6 (six) hours as needed.    . rosuvastatin (CRESTOR) 10 MG tablet Take 10 mg by mouth daily.    . traZODone (DESYREL) 50 MG tablet Take 100 mg by mouth at bedtime.     Marland Kitchen amoxicillin (AMOXIL) 500 MG capsule Take 1 capsule by mouth daily.    Marland Kitchen azithromycin (ZITHROMAX) 250 MG tablet 1 po daily with food (Patient not taking: Reported on 06/11/2017) 4 each 0    Allergies as of 06/11/2017 - Review Complete 06/11/2017  Allergen Reaction Noted  . Iodinated diagnostic agents Anaphylaxis 10/11/2010  . Sulfa antibiotics Hives 10/11/2010  . Dilaudid [hydromorphone hcl] Itching and Palpitations 10/11/2010  . Hydrocodone Palpitations 10/28/2014    Family History  Problem Relation Age of Onset  . Heart disease Father   . COPD Father   . Heart failure Maternal Grandmother   . Stroke Mother   . Colon cancer Brother 81       deceased     Social History   Socioeconomic History  . Marital status: Married    Spouse name: Not on file  . Number of children: Not on file  . Years of education: Not on file  . Highest education level: Not on file  Social Needs  . Financial resource strain: Not on file  . Food insecurity - worry: Not on file  . Food insecurity - inability: Not on file  . Transportation  needs - medical: Not on file  . Transportation needs - non-medical: Not on file  Occupational History  . Not on file  Tobacco Use  . Smoking status: Former Smoker    Packs/day: 1.00    Years: 23.00    Pack years: 23.00    Types: Cigarettes    Last attempt to quit: 05/06/2017    Years since quitting: 0.0  . Smokeless tobacco: Never Used  Substance and Sexual Activity  . Alcohol use: No    Alcohol/week: 0.0 oz  . Drug use: No  . Sexual activity: Yes  Other Topics Concern  . Not on file  Social History Narrative  . Not on file    Review of Systems: Gen: Denies fever, chills, loss of appetite, change in weight or weight loss CV: Denies chest pain, heart palpitations, syncope, edema  Resp: see HPI  GI: see HPI  GU : Denies urinary burning, urinary frequency, urinary incontinence.  MS: Denies joint pain,swelling, cramping Derm: Denies rash, itching, dry skin Psych: Denies depression, anxiety,confusion, or memory loss Heme: see HPI   Physical Exam: Vital signs in last 24 hours: Temp:  [98 F (36.7 C)] 98 F (36.7 C) (12/18 1313) Pulse Rate:  [67-106] 88 (12/18 1611) Resp:  [16-19] 16 (12/18 1611) BP: (109-117)/(58-67) 114/58 (12/18 1611) SpO2:  [94 %-99 %] 99 % (12/18 1611) Weight:  [178 lb (80.7 kg)] 178 lb (80.7 kg) (12/18 1314)   General:   Alert,  Well-developed, well-nourished, pleasant and cooperative in NAD Head:  Normocephalic and atraumatic. Eyes:  Sclera clear, no icterus.   Conjunctiva pink. Ears:  Normal auditory acuity. Nose:  No deformity, discharge,  or lesions. Mouth:  No deformity or lesions Lungs:  Clear throughout to auscultation.   Heart:  S1 S2 present  with systolic murmur noted  Abdomen:  Soft, +BS, mild TTP epigastric and lower abdomen without rebound or guarding. No distension. Rectal:  Deferred until time of colonoscopy.   Msk:  Symmetrical without gross deformities. Normal posture. Extremities:  Without edema. Neurologic:  Alert and   oriented x4 Psych:  Alert and cooperative. Normal mood and affect.  Intake/Output from previous day: No intake/output data recorded. Intake/Output this shift: No intake/output data recorded.  Lab Results: Recent Labs    06/11/17 1340  WBC 9.4  HGB 7.9*  HCT 26.0*  PLT 375   BMET Recent Labs    06/11/17 1340  NA 136  K 4.2  CL 105  CO2 20*  GLUCOSE 162*  BUN 9  CREATININE 0.81  CALCIUM 9.0    Studies/Results: Dg Chest 2 View  Result Date: 06/11/2017 CLINICAL DATA:  Cough, shortness of Breath EXAM: CHEST  2 VIEW COMPARISON:  02/08/2017 FINDINGS: Diffuse interstitial prominence throughout the lungs, likely chronic interstitial lung disease. Mild peribronchial thickening. Heart is normal size. No effusions or acute bony abnormality. IMPRESSION: Mild peribronchial thickening and interstitial prominence, favor chronic interstitial lung disease. Electronically Signed   By: Rolm Baptise M.D.   On: 06/11/2017 14:32    Impression: 51 year old female admitted with symptomatic anemia, Hgb 7.9. I note that her Hgb was around the 8 range at time of discharge from [facility] in Nov 2018; however, several months ago her Hgb was normal in epic (Aug 2018). She reports bright red blood with wiping but no melena. Endorses Ibuprofen routinely and recently started taking 325 mg aspirin daily. No prior colonoscopy or upper endoscopy. Family history is significant for brother diagnosed with colon cancer and succumbed to the disease in his early 35s. Her abdominal exam is only with mild tenderness to palpation, and rectal exam noted in ED documentation without obvious hemorrhoids, fissures, mass, or tenderness on DRE.   Will proceed with diagnostic colonoscopy +/- EGD with Propofol by Dr. Gala Romney on 06/12/17. 2 units PRBCs have been ordered due to symptomatic anemia. Will start 1/2 prep this evening and remainder of prep tomorrow morning. NPO after midnight.   Plan: Clear liquids NPO after  midnight except for medications Agree with transfusion  Protonix once daily  Split prep for colonoscopy, 2 tap water enemas in the morning Colonoscopy +/- EGD with Dr. Gala Romney utilizing Propofol on 12/19. Risks and benefits discussed in detail with stated understanding  Annitta Needs, PhD, ANP-BC [facility]     LOS: 0 days    06/11/2017, 4:54 PM

## 2017-06-11 NOTE — Progress Notes (Signed)
Pt c/o headache which she states she has all the time. Rn adv pt that per GI note bleed may be from Ibuprofen use. Pt states she just wants to try some Tylenol, Dr. Hilbert Bible paged and made aware. Waiting for orders/call back.

## 2017-06-11 NOTE — Progress Notes (Signed)
ANTIBIOTIC CONSULT NOTE - INITIAL  Pharmacy Consult for rocephin Indication: UTI  Allergies  Allergen Reactions  . Iodinated Diagnostic Agents Anaphylaxis  . Sulfa Antibiotics Hives  . Dilaudid [Hydromorphone Hcl] Itching and Palpitations  . Hydrocodone Palpitations    Patient Measurements: Height: _0  (167.6 cm) Weight: 179 lb 3.2 oz (81.3 kg) IBW/kg (Calculated) : 59.3   Vital Signs: Temp: 98.4 F (36.9 C) (12/18 1917) Temp Source: Oral (12/18 1917) BP: 126/43 (12/18 1917) Pulse Rate: 88 (12/18 1917) Intake/Output from previous day: No intake/output data recorded. Intake/Output from this shift: Total I/O In: 400 [P.O.:400] Out: 500 [Urine:500]  Labs: Recent Labs    06/11/17 1340  WBC 9.4  HGB 7.9*  PLT 375  CREATININE 0.81   Estimated Creatinine Clearance: 89.3 mL/min (by C-G formula based on SCr of 0.81 mg/dL). No results for input(s): VANCOTROUGH, VANCOPEAK, VANCORANDOM, GENTTROUGH, GENTPEAK, GENTRANDOM, TOBRATROUGH, TOBRAPEAK, TOBRARND, AMIKACINPEAK, AMIKACINTROU, AMIKACIN in the last 72 hours.   Microbiology: No results found for this or any previous visit (from the past 720 hour(s)).  Medical History: Past Medical History:  Diagnosis Date  . Acute respiratory failure ([place])   . Anemia 08-2008   Blood transfusion  . Anxiety   . Benzodiazepine dependence ([place])   . Cervical cancer ([place])   . CHF (congestive heart failure) ([place])   . Depression   . Diabetes mellitus without complication ([place])   . Headache(784.0)    migraines  . Hypokalemia   . Legionella pneumonia ([place])   . Leukocytosis, unspecified   . Mitral stenosis   . Panic attacks   . Tobacco abuse     Medications:  See medication history Assessment: 51 yo lady to start rocephin for UTI  Goal of Therapy:  Eradication of infection  Plan:  Rocephin 1gm IV q24 hours Pharmacy will sign off as no dose adjustment necessary  Excell Seltzer P[place]et 06/11/2017,9:22 PM

## 2017-06-11 NOTE — ED Provider Notes (Signed)
[facility] EMERGENCY DEPARTMENT Provider Note   CSN: [id number] Arrival date & time: 06/11/17  1307     History   Chief Complaint Chief Complaint  Patient presents with  . Cough    HPI Holly Figueroa is a 51 y.o. female.  HPI  Patient is a 51 year old female, she has a known history of diabetes, congestive heart failure, as well as a reported history of anemia, she was admitted to the hospital 1 month ago in [place] and reports having "double pneumonia".  She was in the hospital for approximately 5 days and states that during that time they had discussed intubation though she refused it at that time.  She improved and was discharged on Augmentin as well as on metformin which was apparently a new medication for her.  It was noted based on lab work that accompanies the patient that her hemoglobin A1c was approximately 7 when she was at the other hospital.  The patient reports that since being out of the hospital she gets short of breath with ambulation, she appears generally weak when she stands or walks but when she is resting she feels fine.  She still has coughing and feels like the coughing is slightly worse recently.  There is no fevers, no swelling of the legs, no blurred vision headaches numbness weakness slurred speech or any other complaints.  She does not have chest pain, back pain or abdominal pain and her appetite has been reasonable.  Patient reports that her brother died at the age of 77 because of colon cancer and GI bleeding.  Her father was recently admitted to an outside hospital because of GI bleeding as well, he has not been known to have cancer.  The patient endorses having intermittent rectal bleeding over the last year, she cannot recall if the blood is mixed in with her stool or just on the toilet paper but notes that it does seem to be bright red.  She does take a daily baby aspirin.  Her last hemoglobin was 12.2 several months ago  Past Medical History:    Diagnosis Date  . Acute respiratory failure ([place])   . Anemia 08-2008   Blood transfusion  . Anxiety   . Benzodiazepine dependence ([place])   . Cervical cancer ([place])   . CHF (congestive heart failure) ([place])   . Depression   . Diabetes mellitus without complication ([place])   . Headache(784.0)    migraines  . Hypertension   . Hypokalemia   . Legionella pneumonia ([place])   . Leukocytosis, unspecified   . Mitral stenosis   . Panic attacks   . Tobacco abuse     Patient Active Problem List   Diagnosis Date Noted  . ARDS (adult respiratory distress syndrome) ([place]) 10/30/2014  . Bilateral pneumonia 10/29/2014  . CAP (community acquired pneumonia) 10/29/2014  . Hypokalemia   . Hypoxia   . UTI (lower urinary tract infection)   . Tobacco abuse 10/11/2010  . Acute respiratory failure ([place]) 10/11/2010    Past Surgical History:  Procedure Laterality Date  . CARDIAC VALVE REPLACEMENT  2011   stretched mitral valve  . CERVICAL CONIZATION W/BX  2000  . CHOLECYSTECTOMY  06/26/2012   Procedure: LAPAROSCOPIC CHOLECYSTECTOMY WITH INTRAOPERATIVE CHOLANGIOGRAM;  Surgeon: Ralene Ok, MD;  Location: [facility];  Service: General;  Laterality: N/A;  . LASER ABLATION OF THE CERVIX    . MOLE REMOVAL    . NASAL SINUS SURGERY      OB History  No data available       Home Medications    Prior to Admission medications   Medication Sig Start Date End Date Taking? Authorizing Provider  acetaminophen (TYLENOL) 500 MG tablet Take 1,000 mg by mouth every 6 (six) hours as needed for mild pain or moderate pain.    [provider]  ALPRAZolam Duanne Moron) 1 MG tablet Take 0.5 tablets (0.5 mg total) by mouth 3 (three) times daily. anxiety Patient taking differently: Take 1 mg by mouth 3 (three) times daily. anxiety 11/08/14   Donita Brooks, NP  amoxicillin (AMOXIL) 500 MG capsule Take 1 capsule by mouth daily. 12/04/10   [provider]  aspirin EC 81 MG tablet Take 81 mg by mouth daily.     [provider]  azithromycin (ZITHROMAX) 250 MG tablet 1 po daily with food 03/16/16   Lily Kocher, PA-C  FLUoxetine (PROZAC) 40 MG capsule Take 40 mg by mouth 2 (two) times daily.     [provider]  furosemide (LASIX) 20 MG tablet Take 20 mg by mouth daily as needed for fluid.    [provider]  ibuprofen (ADVIL,MOTRIN) 200 MG tablet Take 600 mg by mouth every 6 (six) hours as needed for fever, headache or mild pain.    [provider]  metFORMIN (GLUCOPHAGE) 500 MG tablet Take 1 tablet (500 mg total) by mouth 2 (two) times daily with a meal. 02/08/17   Pattricia Boss, MD  omeprazole (PRILOSEC) 20 MG capsule Take 20 mg by mouth daily.      [provider]  traZODone (DESYREL) 50 MG tablet Take 50-100 mg by mouth at bedtime.    [provider]    Family History Family History  Problem Relation Age of Onset  . Heart disease Father   . COPD Father   . Heart failure Maternal Grandmother   . Stroke Mother     Social History Social History   Tobacco Use  . Smoking status: Former Smoker    Packs/day: 1.00    Years: 23.00    Pack years: 23.00    Types: Cigarettes    Last attempt to quit: 10/28/2014    Years since quitting: 2.6  . Smokeless tobacco: Never Used  Substance Use Topics  . Alcohol use: No    Alcohol/week: 0.0 oz  . Drug use: No     Allergies   Iodinated diagnostic agents; Sulfa antibiotics; Dilaudid [hydromorphone hcl]; and Hydrocodone   Review of Systems Review of Systems  All other systems reviewed and are negative.    Physical Exam Updated Vital Signs BP 117/67 (BP Location: Right Arm)   Pulse (!) 106   Temp 98 F (36.7 C) (Oral)   Resp 18   Ht _0  (1.676 m)   Wt 80.7 kg (178 lb)   SpO2 94%   BMI 28.73 kg/m   Physical Exam  Constitutional: She appears well-developed and well-nourished. No distress.  HENT:  Head: Normocephalic and atraumatic.  Mouth/Throat: Oropharynx is clear and moist.  No oropharyngeal exudate.  Eyes: Conjunctivae and EOM are normal. Pupils are equal, round, and reactive to light. Right eye exhibits no discharge. Left eye exhibits no discharge. No scleral icterus.  Neck: Normal range of motion. Neck supple. No JVD present. No thyromegaly present.  Cardiovascular: Normal rate, regular rhythm, normal heart sounds and intact distal pulses. Exam reveals no gallop and no friction rub.  No murmur heard. Pulmonary/Chest: Effort normal and breath sounds normal. No respiratory distress.  She has no wheezes. She has no rales.  Lung sounds are clear diffusely with no increased work of breathing, oxygenation of 100% on room air in the supine position  Abdominal: Soft. Bowel sounds are normal. She exhibits no distension and no mass. There is no tenderness.  No abdominal tenderness to palpation or guarding  Genitourinary:  Genitourinary Comments: Chaperone present for exam, no masses, tenderness, hemorrhoids, fissures and no stool in the rectal vault.  Musculoskeletal: Normal range of motion. She exhibits no edema or tenderness.  No peripheral edema  Lymphadenopathy:    She has no cervical adenopathy.  Neurological: She is alert. Coordination normal.  Normal mental status, normal range of motion in all 4 extremities, normal strength and sensation diffusely.  She is able to follow commands without difficulty.  Skin: Skin is warm and dry. No rash noted. No erythema.  Psychiatric: She has a normal mood and affect. Her behavior is normal.  Nursing note and vitals reviewed.    ED Treatments / Results  Labs (all labs ordered are listed, but only abnormal results are displayed) Labs Reviewed  CBC WITH DIFFERENTIAL/PLATELET - Abnormal; Notable for the following components:      Result Value   RBC 2.92 (*)    Hemoglobin 7.9 (*)    HCT 26.0 (*)    All other components within normal limits  CBG MONITORING, ED - Abnormal; Notable for the following components:    Glucose-Capillary 162 (*)    All other components within normal limits  BASIC METABOLIC PANEL    EKG  EKG Interpretation  Date/Time:  Tuesday June 11 2017 13:29:55 EST Ventricular Rate:  83 PR Interval:    QRS Duration: 84 QT Interval:  373 QTC Calculation: 439 R Axis:   49 Text Interpretation:  Sinus rhythm Normal ECG since last tracing no significant change Confirmed by Noemi Chapel [phone]) on 06/11/2017 2:03:06 PM       Radiology No results found.  Procedures Procedures (including critical care time)  Procedure Note:  Anoscopy  Risks benefits alternatives of the procedure given to the patient Verbal Consent obtained Patient placed in the lateral decubitus position Anoscopy performed Findings  No bleeding, no hemorrhoids, no fissures. Patient tolerated procedure without any complaints   Medications Ordered in ED Medications - No data to display   Initial Impression / Assessment and Plan / ED Course  I have reviewed the triage vital signs and the nursing notes.  Pertinent labs & imaging results that were available during my care of the patient were reviewed by me and considered in my medical decision making (see chart for details).     The patient CBC shows that she is anemic with a hemoglobin less than 8, her last measurement was 4 months ago at which time it was 12.2.  The patient has a significant drop in her hemoglobin, it is measuring at 7.9 today.  She will need to be admitted for symptomatic anemia, thankfully she is not tachycardic nor she hypotensive.  She is dizzy even with sitting up.  Her Hemoccult test is pending, her anoscope exam shows no signs of significant hemorrhoids or fissures, there is no masses or tenderness on digital exam.  D/w Dr. Oneida Alar who will see pt in the hospital in consultation D/w Dr. Aggie Moats who will admit to hospitalist service.    Final Clinical Impressions(s) / ED Diagnoses   Final diagnoses:  Symptomatic anemia     ED Discharge Orders    None  Noemi Chapel, MD 06/11/17 [phone]

## 2017-06-12 ENCOUNTER — Other Ambulatory Visit: Payer: Self-pay | Admitting: *Deleted

## 2017-06-12 ENCOUNTER — Encounter (HOSPITAL_COMMUNITY): Payer: Self-pay

## 2017-06-12 ENCOUNTER — Observation Stay (HOSPITAL_COMMUNITY): Payer: Medicare FFS | Admitting: Anesthesiology

## 2017-06-12 ENCOUNTER — Encounter: Payer: Self-pay | Admitting: *Deleted

## 2017-06-12 ENCOUNTER — Telehealth: Payer: Self-pay | Admitting: Gastroenterology

## 2017-06-12 ENCOUNTER — Encounter (HOSPITAL_COMMUNITY)
Admission: EM | Disposition: A | Discharge status: Home / Self Care | Payer: Self-pay | Source: Home / Self Care | Attending: Emergency Medicine

## 2017-06-12 DIAGNOSIS — K921 Melena: Secondary | ICD-10-CM | POA: Diagnosis not present

## 2017-06-12 DIAGNOSIS — R1310 Dysphagia, unspecified: Secondary | ICD-10-CM | POA: Diagnosis not present

## 2017-06-12 DIAGNOSIS — D500 Iron deficiency anemia secondary to blood loss (chronic): Secondary | ICD-10-CM | POA: Diagnosis not present

## 2017-06-12 DIAGNOSIS — I5032 Chronic diastolic (congestive) heart failure: Secondary | ICD-10-CM | POA: Diagnosis not present

## 2017-06-12 DIAGNOSIS — K640 First degree hemorrhoids: Secondary | ICD-10-CM | POA: Diagnosis not present

## 2017-06-12 DIAGNOSIS — K209 Esophagitis, unspecified: Secondary | ICD-10-CM | POA: Diagnosis not present

## 2017-06-12 DIAGNOSIS — D649 Anemia, unspecified: Secondary | ICD-10-CM | POA: Diagnosis not present

## 2017-06-12 DIAGNOSIS — I5033 Acute on chronic diastolic (congestive) heart failure: Secondary | ICD-10-CM

## 2017-06-12 DIAGNOSIS — D509 Iron deficiency anemia, unspecified: Secondary | ICD-10-CM

## 2017-06-12 HISTORY — PX: BIOPSY: SHX5522

## 2017-06-12 HISTORY — PX: MALONEY DILATION: SHX5535

## 2017-06-12 HISTORY — PX: COLONOSCOPY WITH PROPOFOL: SHX5780

## 2017-06-12 HISTORY — PX: ESOPHAGOGASTRODUODENOSCOPY (EGD) WITH PROPOFOL: SHX5813

## 2017-06-12 LAB — BASIC METABOLIC PANEL
ANION GAP: 13 (ref 5–15)
BUN: 9 mg/dL (ref 6–20)
CALCIUM: 9.4 mg/dL (ref 8.9–10.3)
CO2: 20 mmol/L — AB (ref 22–32)
CREATININE: 0.75 mg/dL (ref 0.44–1.00)
Chloride: 106 mmol/L (ref 101–111)
Glucose, Bld: 141 mg/dL — ABNORMAL HIGH (ref 65–99)
Potassium: 4 mmol/L (ref 3.5–5.1)
SODIUM: 139 mmol/L (ref 135–145)

## 2017-06-12 LAB — TYPE AND SCREEN
ABO/RH(D): O POS
ANTIBODY SCREEN: NEGATIVE
UNIT DIVISION: 0
Unit division: 0
Unit division: 0

## 2017-06-12 LAB — FOLATE: Folate: 8.1 ng/mL (ref >5.9)

## 2017-06-12 LAB — BPAM RBC
BLOOD PRODUCT EXPIRATION DATE: 201901172359
BLOOD PRODUCT EXPIRATION DATE: 201901172359
Blood Product Expiration Date: 201901172359
ISSUE DATE / TIME: 201812181837
ISSUE DATE / TIME: 201812182355
UNIT TYPE AND RH: 5100
UNIT TYPE AND RH: 9500
Unit Type and Rh: 5100

## 2017-06-12 LAB — CBC WITH DIFFERENTIAL/PLATELET
BASOS ABS: 0.1 10*3/uL (ref 0.0–0.1)
BASOS PCT: 1 %
EOS ABS: 0.4 10*3/uL (ref 0.0–0.7)
Eosinophils Relative: 5 %
HEMATOCRIT: 35 % — AB (ref 36.0–46.0)
Hemoglobin: 10.6 g/dL — ABNORMAL LOW (ref 12.0–15.0)
Lymphocytes Relative: 27 %
Lymphs Abs: 2.2 10*3/uL (ref 0.7–4.0)
MCH: 26.2 pg (ref 26.0–34.0)
MCHC: 30.3 g/dL (ref 30.0–36.0)
MCV: 86.4 fL (ref 78.0–100.0)
MONO ABS: 0.4 10*3/uL (ref 0.1–1.0)
MONOS PCT: 5 %
NEUTROS ABS: 5.3 10*3/uL (ref 1.7–7.7)
NEUTROS PCT: 62 %
Platelets: 397 10*3/uL (ref 150–400)
RBC: 4.05 MIL/uL (ref 3.87–5.11)
RDW: 15.2 % (ref 11.5–15.5)
WBC: 8.3 10*3/uL (ref 4.0–10.5)

## 2017-06-12 LAB — GLUCOSE, CAPILLARY
GLUCOSE-CAPILLARY: 161 mg/dL — AB (ref 65–99)
GLUCOSE-CAPILLARY: 127 mg/dL — AB (ref 65–99)
GLUCOSE-CAPILLARY: 162 mg/dL — AB (ref 65–99)
Glucose-Capillary: 125 mg/dL — ABNORMAL HIGH (ref 65–99)
Glucose-Capillary: 165 mg/dL — ABNORMAL HIGH (ref 65–99)

## 2017-06-12 LAB — HIV ANTIBODY (ROUTINE TESTING W REFLEX): HIV Screen 4th Generation wRfx: NONREACTIVE

## 2017-06-12 SURGERY — COLONOSCOPY WITH PROPOFOL
Anesthesia: Monitor Anesthesia Care

## 2017-06-12 MED ORDER — SODIUM CHLORIDE 0.9 % IV SOLN: INTRAVENOUS | Status: DC

## 2017-06-12 MED ORDER — DIPHENHYDRAMINE HCL 50 MG/ML IJ SOLN
25.0000 mg | Freq: Once | INTRAMUSCULAR | Status: AC
  Administered 2017-06-12: 25 mg via INTRAVENOUS
  Filled 2017-06-12: qty 0.5

## 2017-06-12 MED ORDER — FENTANYL CITRATE (PF) 100 MCG/2ML IJ SOLN
INTRAMUSCULAR | Status: AC
  Filled 2017-06-12: qty 2

## 2017-06-12 MED ORDER — PROPOFOL 10 MG/ML IV BOLUS
INTRAVENOUS | Status: AC
  Filled 2017-06-12: qty 20

## 2017-06-12 MED ORDER — DIPHENHYDRAMINE HCL 50 MG/ML IJ SOLN
INTRAMUSCULAR | Status: AC
  Filled 2017-06-12: qty 1

## 2017-06-12 MED ORDER — FENTANYL CITRATE (PF) 100 MCG/2ML IJ SOLN
25.0000 ug | Freq: Once | INTRAMUSCULAR | Status: AC
  Administered 2017-06-12: 25 ug via INTRAVENOUS

## 2017-06-12 MED ORDER — METOCLOPRAMIDE HCL 5 MG/5ML PO SOLN
10.0000 mg | Freq: Once | ORAL | Status: AC
  Administered 2017-06-12: 10 mg via ORAL
  Filled 2017-06-12: qty 10

## 2017-06-12 MED ORDER — LIDOCAINE VISCOUS 2 % MT SOLN
15.0000 mL | Freq: Once | OROMUCOSAL | Status: AC
  Administered 2017-06-12: 15 mL via OROMUCOSAL

## 2017-06-12 MED ORDER — LACTATED RINGERS IV SOLN
INTRAVENOUS | Status: DC
  Administered 2017-06-12: 12:00:00 via INTRAVENOUS

## 2017-06-12 MED ORDER — LIDOCAINE VISCOUS 2 % MT SOLN
OROMUCOSAL | Status: AC
  Filled 2017-06-12: qty 15

## 2017-06-12 MED ORDER — PROPOFOL 10 MG/ML IV BOLUS
INTRAVENOUS | Status: DC | PRN
  Administered 2017-06-12 (×3): 20 mg via INTRAVENOUS

## 2017-06-12 MED ORDER — PROPOFOL 500 MG/50ML IV EMUL
INTRAVENOUS | Status: DC | PRN
  Administered 2017-06-12: 125 ug/kg/min via INTRAVENOUS
  Administered 2017-06-12: 100 ug/kg/min via INTRAVENOUS

## 2017-06-12 MED ORDER — MIDAZOLAM HCL 2 MG/2ML IJ SOLN
1.0000 mg | INTRAMUSCULAR | Status: DC
  Administered 2017-06-12: 2 mg via INTRAVENOUS

## 2017-06-12 MED ORDER — MIDAZOLAM HCL 2 MG/2ML IJ SOLN
INTRAMUSCULAR | Status: AC
  Filled 2017-06-12: qty 2

## 2017-06-12 NOTE — Addendum Note (Signed)
Addendum  created 06/12/17 1324 by Vista Deck, CRNA   Charge Capture section accepted

## 2017-06-12 NOTE — Progress Notes (Signed)
Pt complaining that no one came up to talk to her about her EGD/colonoscopy today and she wants to know what is going on. Pt stated that she wants to know why she is bleeding, why she is short of breath and what the results of her procedure was. Husband in sister is in room asking numerous questions and wanting to be transferred to [facility] hospital.  Day RN-Daniel has tried to paged DrMarland Kitchen Oneida Alar numerous times with no success. ---RN spoke with Dr. Gala Romney after paging him and mentioned to him all of the patient/family concerns. He stated he would pull up her chart and call pt in the room. Pt and family made aware.

## 2017-06-12 NOTE — Telephone Encounter (Addendum)
Patient is scheduled for GIVENS on 06/27/17 at 7:00am over at [facility]. I have placed instructions in the mail to patient.

## 2017-06-12 NOTE — Progress Notes (Signed)
PROGRESS NOTE  Holly Figueroa QUR:[id number] DOB: [birth year] DOA: 06/11/2017 PCP: Patient, No Pcp Per  Brief History:  51 year old female with a history of diastolic CHF, diabetes mellitus, anxiety/panic attacks, tobacco abuse, and benzodiazepine dependence presenting with one-month history of fatigue, dyspnea on exertion, and generalized weakness.  The patient states that she has had intermittent hematochezia that she notices when she wipes for the past year.  She denies any melena, hematemesis, hematuria, dysuria.  Patient was recently discharged from OSH in [place], [place] where she was treated for pneumonia and acute diastolic CHF.  She was admitted from May 04, 2017 through May 09, 2017.  Approximately 1 week after discharge, she noted progressive dyspnea on exertion with some intermittent chest discomfort.  In addition, the patient endorses use of daily ibuprofen 600 mg twice daily for the past year.  She denied any vomiting, diarrhea, hematemesis, melena.  In the emergency department, the patient was noted to have hemoglobin of 7.9.  She was transfused 2 units PRBC.  GI was consulted and planned EGD and colonoscopy June 12, 2017.  Assessment/Plan: Symptomatic anemia -May 05, 2017 hemoglobin 10.2 -May 08, 2017 hemoglobin 8.2--day of discharge from last hospitalization -Presenting hemoglobin 7.9 -Transfuse 2 units PRBC -Appreciate GI consult -Iron saturation 6%, ferritin 63; serum I20--199; folic acid pending -Concerned about NSAID induced peptic ulcer disease  Diabetes mellitus type 2 -May 05, 2017 hemoglobin A1c 7.2 -Holding metformin during hospitalization -NovoLog sliding scale  Chronic diastolic CHF -Appears euvolemic clinically -The patient normally sees the Dr. Einar Gip -She only takes Lasix as needed -May 05, 2017 echo--EF>75%, moderate MS, probable moderate left ear, trace TR  Panic attack/anxiety -Continue home dose  alprazolam -The [place] Controlled Substance Reporting System has been queried for this patient for the past 24 months. -Patient last received alprazolam 1 mg, #150, prescribed by Dr. Rosine Door on May 21, 2017  Tobacco abuse -The patient quit 1 month ago -NicoDerm patch -She has nearly 50-pack-year history  Hyperlipidemia -Continue statin   Disposition Plan:   Home 12/19 or 12/20 when cleared by GI Family Communication:  No Family at bedside  Consultants:  Gi  Code Status:  FULL  DVT Prophylaxis:  SCDs   Procedures: As Listed in Progress Note Above  Antibiotics: None    Subjective: Patient denies fevers, chills, headache, chest pain, dyspnea, nausea, vomiting, diarrhea, abdominal pain, dysuria, hematuria, hematochezia, and melena.   Objective: Vitals:   06/11/17 2353 06/12/17 0022 06/12/17 0250 06/12/17 0500  BP: (!) 146/61 134/61 124/62 (!) 111/51  Pulse: 80 80 80 85  Resp: _0 Temp: 97.6 F (36.4 C) (!) 97.5 F (36.4 C) 98.3 F (36.8 C) 97.9 F (36.6 C)  TempSrc: Oral Oral Oral Oral  SpO2: 98% 98% 98% 96%  Weight:    79.7 kg (175 lb 11.2 oz)  Height:        Intake/Output Summary (Last 24 hours) at 06/12/2017 0825 Last data filed at 06/12/2017 0523 Gross per 24 hour  Intake 1730 ml  Output 2000 ml  Net -270 ml   Weight change:  Exam:   General:  Pt is alert, follows commands appropriately, not in acute distress  HEENT: No icterus, No thrush, No neck mass, [place]/AT  Cardiovascular: RRR, S1/S2, no rubs, no gallops  Respiratory: CTA bilaterally, no wheezing, no crackles, no rhonchi  Abdomen: Soft/+BS, non tender, non distended, no guarding  Extremities: No edema, No lymphangitis, No petechiae,  No rashes, no synovitis   Data Reviewed: I have personally reviewed following labs and imaging studies Basic Metabolic Panel: Recent Labs  Lab 06/11/17 1340 06/12/17 0537  NA 136 139  K 4.2 4.0  CL 105 106  CO2 20* 20*  GLUCOSE  162* 141*  BUN 9 9  CREATININE 0.81 0.75  CALCIUM 9.0 9.4   Liver Function Tests: Recent Labs  Lab 06/11/17 1601  AST 23  ALT 24  ALKPHOS 85  BILITOT 0.4  PROT 7.2  ALBUMIN 3.5   No results for input(s): LIPASE, AMYLASE in the last 168 hours. No results for input(s): AMMONIA in the last 168 hours. Coagulation Profile: Recent Labs  Lab 06/11/17 1556  INR 1.08   CBC: Recent Labs  Lab 06/11/17 1340 06/12/17 0537  WBC 9.4 8.3  NEUTROABS 6.5 5.3  HGB 7.9* 10.6*  HCT 26.0* 35.0*  MCV 89.0 86.4  PLT 375 397   Cardiac Enzymes: No results for input(s): CKTOTAL, CKMB, CKMBINDEX, TROPONINI in the last 168 hours. BNP: Invalid input(s): POCBNP CBG: Recent Labs  Lab 06/11/17 1400 06/11/17 1809 06/11/17 2121 06/12/17 0755  GLUCAP 162* 161* 161* 161*   HbA1C: No results for input(s): HGBA1C in the last 72 hours. Urine analysis:    Component Value Date/Time   COLORURINE YELLOW 06/11/2017 1546   APPEARANCEUR HAZY (A) 06/11/2017 1546   LABSPEC 1.015 06/11/2017 1546   PHURINE 5.0 06/11/2017 1546   GLUCOSEU NEGATIVE 06/11/2017 1546   HGBUR NEGATIVE 06/11/2017 1546   BILIRUBINUR NEGATIVE 06/11/2017 1546   KETONESUR NEGATIVE 06/11/2017 1546   PROTEINUR NEGATIVE 06/11/2017 1546   UROBILINOGEN 0.2 10/28/2014 2330   NITRITE POSITIVE (A) 06/11/2017 1546   LEUKOCYTESUR MODERATE (A) 06/11/2017 1546   Sepsis Labs: _0 (procalcitonin:4,lacticidven:4) )No results found for this or any previous visit (from the past 240 hour(s)).   Scheduled Meds: . FLUoxetine  80 mg Oral Daily  . insulin aspart  0-15 Units Subcutaneous TID WC  . nicotine  7 mg Transdermal Daily  . pantoprazole  40 mg Oral Daily  . rosuvastatin  10 mg Oral Daily  . traZODone  100 mg Oral QHS   Continuous Infusions: . cefTRIAXone (ROCEPHIN)  IV Stopped (06/11/17 2350)    Procedures/Studies: Dg Chest 2 View  Result Date: 06/11/2017 CLINICAL DATA:  Cough, shortness of Breath EXAM: CHEST  2 VIEW  COMPARISON:  02/08/2017 FINDINGS: Diffuse interstitial prominence throughout the lungs, likely chronic interstitial lung disease. Mild peribronchial thickening. Heart is normal size. No effusions or acute bony abnormality. IMPRESSION: Mild peribronchial thickening and interstitial prominence, favor chronic interstitial lung disease. Electronically Signed   By: Rolm Baptise M.D.   On: 06/11/2017 14:32    Orson Eva, DO  [facility] Pager [phone]  If 7PM-7AM, please contact night-coverage [email or web address] Password [email or web address] 06/12/2017, 8:25 AM   LOS: 0 days

## 2017-06-12 NOTE — Progress Notes (Signed)
Dr. Hilbert Bible paged to see if pt could get medication for pain d/t headache. Waiting for orders/ call back.

## 2017-06-12 NOTE — Anesthesia Procedure Notes (Signed)
Procedure Name: MAC Date/Time: 06/12/2017 12:07 PM Performed by: Vista Deck, CRNA Pre-anesthesia Checklist: Patient identified, Emergency Drugs available, Suction available, Timeout performed and Patient being monitored Patient Re-evaluated:Patient Re-evaluated prior to induction Oxygen Delivery Method: Non-rebreather mask

## 2017-06-12 NOTE — Anesthesia Postprocedure Evaluation (Signed)
Anesthesia Post Note  Patient: Holly Figueroa  Procedure(s) Performed: COLONOSCOPY WITH PROPOFOL (N/A ) ESOPHAGOGASTRODUODENOSCOPY (EGD) WITH PROPOFOL (N/A ) [place] BIOPSY  Patient location during evaluation: PACU Anesthesia Type: MAC Level of consciousness: awake and alert and patient cooperative Pain management: satisfactory to patient Vital Signs Assessment: post-procedure vital signs reviewed and stable Respiratory status: spontaneous breathing Cardiovascular status: stable Postop Assessment: no apparent nausea or vomiting Anesthetic complications: no     Last Vitals:  Vitals:   06/12/17 1105 06/12/17 1255  BP: 134/79 119/60  Pulse: 86   Resp:    Temp: 36.9 C 36.6 C  SpO2: 97% 94%    Last Pain:  Vitals:   06/12/17 1105  TempSrc: Oral  PainSc:                  Drucie Opitz

## 2017-06-12 NOTE — Telephone Encounter (Signed)
Please schedule for small bowel capsule study as outpatient week of Dec 31st or after. Dx: IDA, unexplained.

## 2017-06-12 NOTE — Progress Notes (Signed)
Pt c/o shortness of dyspnea with exertion. RN monitored pts oxygen saturation while walking, pts oxygen saturation when sitting 96%, while walking only decreased to 95%. Pt thinks she was having a panic attack. After paging Dr. Hilbert Bible for pain medication, MD ordered Benadryl and Reglan. AC made aware d/t Benadryl being a drip. Will continue to monitor pt

## 2017-06-12 NOTE — Progress Notes (Addendum)
Patient seen and examined in short stay. Hemoglobin 10.6 this morning. Patient also relates significant esophageal dysphagia to solids and pills.  I've offered the patient both colonoscopy and EGD with possible esophageal dilation.  The risks, benefits, limitations, alternatives and imponderables have been reviewed with the patient. Questions have been answered. All parties are agreeable.   Further recommendations to follow.

## 2017-06-12 NOTE — Telephone Encounter (Signed)
Checked [employer]'s website and no PA is needed for GIVENS

## 2017-06-12 NOTE — Op Note (Signed)
[facility] Patient Name: Holly Figueroa Procedure Date: 06/12/2017 11:13 AM MRN: [id number] Date of Birth: 02/15/1966 Attending MD: Norvel Richards , MD CSN: [id number] Age: 51 Admit Type: Inpatient Procedure:                Colonoscopy Indications:              Hematochezia Providers:                Norvel Richards, MD, Jeanann Lewandowsky. Sharon Seller, RN,                            Nelma Rothman, Technician, Randa Spike, Technician Referring MD:              Medicines:                Propofol per Anesthesia Complications:            No immediate complications. Estimated Blood Loss:     Estimated blood loss: none. Procedure:                Pre-Anesthesia Assessment:                           - Prior to the procedure, a History and Physical                            was performed, and patient medications and                            allergies were reviewed. The patient's tolerance of                            previous anesthesia was also reviewed. The risks                            and benefits of the procedure and the sedation                            options and risks were discussed with the patient.                            All questions were answered, and informed consent                            was obtained. Prior Anticoagulants: The patient has                            taken no previous anticoagulant or antiplatelet                            agents. ASA Grade Assessment: II - A patient with                            mild systemic disease. After reviewing the risks  and benefits, the patient was deemed in                            satisfactory condition to undergo the procedure.                           After obtaining informed consent, the colonoscope                            was passed under direct vision. Throughout the                            procedure, the patient's blood pressure, pulse, and   oxygen saturations were monitored continuously. The                            [id number] ([id number]) scope was introduced through                            the and advanced to the 5 cm into the ileum. The                            terminal ileum, ileocecal valve, appendiceal                            orifice, and rectum were photographed. The entire                            colon was well visualized. The patient tolerated                            the procedure well. The quality of the bowel                            preparation was adequate. Scope In: 12:17:35 PM Scope Out: 12:29:58 PM Scope Withdrawal Time: 0 hours 8 minutes 12 seconds  Total Procedure Duration: 0 hours 12 minutes 23 seconds  Findings:      The colon (entire examined portion) appeared normal.      No additional abnormalities were found on retroflexion. Normal terminal       ileum. The distal 5 cm of terminal ileum appear normal.      Non-bleeding internal hemorrhoids were found during retroflexion. The       hemorrhoids were Grade I (internal hemorrhoids that do not prolapse). Impression:               - The entire examined colon is normal. Normal                            terminal ileum. No blood in the lower GI tract. No                            adequate explanation for anemia or GI bleeding  based on today's examination. No blood in the lower                            GI tract.                           - No specimens collected. Moderate Sedation:      Moderate (conscious) sedation was personally administered by an       anesthesia professional. The following parameters were monitored: oxygen       saturation, heart rate, blood pressure, respiratory rate, EKG, adequacy       of pulmonary ventilation, and response to care. Total physician       intraservice time was 18 minutes. Recommendation:           - Patient has a contact number available for                             emergencies. The signs and symptoms of potential                            delayed complications were discussed with the                            patient. Return to normal activities tomorrow.                            Written discharge instructions were provided to the                            patient.                           - Clear liquid diet. See EGD report.                           - Repeat colonoscopy in 5 years for screening                            purposes. Procedure Code(s):        --- Professional ---                           [phone], Colonoscopy, flexible; diagnostic, including                            collection of specimen(s) by brushing or washing,                            when performed (separate procedure) Diagnosis Code(s):        --- Professional ---                           K92.1, Melena (includes Hematochezia) CPT copyright 2016 American Medical Association. All rights reserved. The codes documented in this report are preliminary and upon coder review may  be revised to meet current compliance requirements. [name]. Gala Romney, MD Herbie Baltimore  Garfield Cornea, MD 06/12/2017 12:49:27 PM This report has been signed electronically. Number of Addenda: 0

## 2017-06-12 NOTE — Transfer of Care (Signed)
Immediate Anesthesia Transfer of Care Note  Patient: Holly Figueroa  Procedure(s) Performed: COLONOSCOPY WITH PROPOFOL (N/A ) ESOPHAGOGASTRODUODENOSCOPY (EGD) WITH PROPOFOL (N/A ) [place]ONE[place] DILATION BIOPSY  Patient Location: PACU  Anesthesia Type:MAC  Level of Consciousness: awake, alert  and patient cooperative  Airway & Oxygen Therapy: Patient Spontanous Breathing and Patient connected to nasal cannula oxygen  Post-op Assessment: Report given to RN and Post -op Vital signs reviewed and stable  Post vital signs: Reviewed and stable  Last Vitals:  Vitals:   06/12/17 0500 06/12/17 1105  BP: (!) 111/51 134/79  Pulse: 85 86  Resp: 16   Temp: 36.6 C 36.9 C  SpO2: 96% 97%    Last Pain:  Vitals:   06/12/17 1105  TempSrc: Oral  PainSc:       Patients Stated Pain Goal: 0 (04/59/97 7414)  Complications: No apparent anesthesia complications

## 2017-06-12 NOTE — Progress Notes (Signed)
Patient refused 0800 tap water enemas, patient did drink golytely, patient stated her stools were clear yellow. Notified Neil Crouch, PA of patient's refusal.

## 2017-06-12 NOTE — Op Note (Addendum)
[facility] Patient Name: Holly Figueroa Procedure Date: 06/12/2017 12:32 PM MRN: [id number] Date of Birth: 12-01-1965 Attending MD: Norvel Richards , MD CSN: [id number] Age: 51 Admit Type: Outpatient Procedure:                Upper GI endoscopy Indications:              Dysphagia, Hematochezia Providers:                Norvel Richards, MD, Jeanann Lewandowsky. Sharon Seller, RN,                            Randa Spike, Technician, Nelma Rothman, Technician Referring MD:              Medicines:                Propofol per Anesthesia Complications:            No immediate complications. Estimated Blood Loss:     Estimated blood loss was minimal. Procedure:                Pre-Anesthesia Assessment:                           - Prior to the procedure, a History and Physical                            was performed, and patient medications and                            allergies were reviewed. The patient's tolerance of                            previous anesthesia was also reviewed. The risks                            and benefits of the procedure and the sedation                            options and risks were discussed with the patient.                            All questions were answered, and informed consent                            was obtained. Prior Anticoagulants: The patient has                            taken no previous anticoagulant or antiplatelet                            agents. ASA Grade Assessment: II - A patient with                            mild systemic disease. After reviewing the risks  and benefits, the patient was deemed in                            satisfactory condition to undergo the procedure.                           After obtaining informed consent, the endoscope was                            passed under direct vision. Throughout the                            procedure, the patient's blood pressure, pulse, and                             oxygen saturations were monitored continuously. The                            [id number] ([id number]) scope was introduced through the                            and advanced to the second part of duodenum. The                            upper GI endoscopy was accomplished without                            difficulty. The patient tolerated the procedure                            well. The upper GI endoscopy was accomplished                            without difficulty. The patient tolerated the                            procedure well. Scope In: 12:35:43 PM Scope Out: 12:46:23 PM Total Procedure Duration: 0 hours 10 minutes 40 seconds  Findings:      Distal Esophagitis was found. Plaques localized to the esophageal mucosa       just above the GE junction. .      A small hiatal hernia was present.      The duodenal bulb and second portion of the duodenum were normal. The       scope was withdrawn. Dilation was performed with a Maloney dilator with       mild resistance at 29 Fr. The dilation site was examined following       endoscope reinsertion and showed mild improvement in luminal narrowing.       Estimated blood loss was minimal. Finally,This was biopsied with a cold       forceps for histology. Estimated blood loss was minimal Impression:               - Esophagitis - possibly chemical or pill induced.  Dilated. Biopsied.                           - Small hiatal hernia.                           - Normal duodenal bulb and second portion of the                            duodenum. No explanation for anemia based on                            today's findingsgs. Moderate Sedation:      Moderate (conscious) sedation was personally administered by an       anesthesia professional. The following parameters were monitored: oxygen       saturation, heart rate, blood pressure, respiratory rate, EKG, adequacy       of pulmonary ventilation,  and response to care. Total physician       intraservice time was 18 minutes. Recommendation:           - Return patient to referring hospital for ongoing                            care.                           - Clear liquid diet. Continue PPI.                           - Continue present medications.                           - No repeat upper endoscopy.                           - See colonoscopy report. Advance diet. Avoid all                            forms of nonsteroidal agents for now. We will                            arrange an outpatient capsule study of the small                            bowel in the next couple of weeks Procedure Code(s):        --- Professional ---                           [phone], Esophagogastroduodenoscopy, flexible,                            transoral; with biopsy, single or multiple                           43450, Dilation of esophagus, by unguided sound or  bougie, single or multiple passes Diagnosis Code(s):        --- Professional ---                           K20.9, Esophagitis, unspecified                           K44.9, Diaphragmatic hernia without obstruction or                            gangrene                           R13.10, Dysphagia, unspecified                           K92.1, Melena (includes Hematochezia) CPT copyright 2016 American Medical Association. All rights reserved. The codes documented in this report are preliminary and upon coder review may  be revised to meet current compliance requirements. [name]. Rourk, MD Norvel Richards, MD 06/12/2017 12:56:18 PM This report has been signed electronically. Number of Addenda: 0

## 2017-06-12 NOTE — Anesthesia Preprocedure Evaluation (Signed)
Anesthesia Evaluation  Patient identified by MRN, date of birth, ID band Patient awake    Reviewed: Allergy & Precautions, H&P , NPO status , Patient's Chart, lab work & pertinent test results  Airway Mallampati: III  TM Distance: <3 FB Neck ROM: Full  Mouth opening: Limited Mouth Opening  Dental no notable dental hx.    Pulmonary pneumonia, Current Smoker, former smoker,    Pulmonary exam normal breath sounds clear to auscultation       Cardiovascular hypertension, Pt. on medications +CHF  Normal cardiovascular exam+ Valvular Problems/Murmurs MR  Rhythm:Regular Rate:Normal     Neuro/Psych  Headaches, PSYCHIATRIC DISORDERS Anxiety Depression    GI/Hepatic negative GI ROS, Neg liver ROS,   Endo/Other  diabetes, Type 2  Renal/GU negative Renal ROS  negative genitourinary   Musculoskeletal negative musculoskeletal ROS (+)   Abdominal   Peds negative pediatric ROS (+)  Hematology  (+) anemia ,   Anesthesia Other Findings   Reproductive/Obstetrics negative OB ROS                             Anesthesia Physical Anesthesia Plan  ASA: III  Anesthesia Plan: MAC   Post-op Pain Management:    Induction: Intravenous  PONV Risk Score and Plan:   Airway Management Planned: Simple Face Mask  Additional Equipment:   Intra-op Plan:   Post-operative Plan:   Informed Consent: I have reviewed the patients History and Physical, chart, labs and discussed the procedure including the risks, benefits and alternatives for the proposed anesthesia with the patient or authorized representative who has indicated his/her understanding and acceptance.     Plan Discussed with: CRNA and Surgeon  Anesthesia Plan Comments:         Anesthesia Quick Evaluation

## 2017-06-12 NOTE — Progress Notes (Signed)
Spoke with both pt and husband this evening regarding EGD/TCS findings  (family not available earlier today following procedures).  Plans for small bowel capsule study reviewed.  Also, reasoning behind withholding full dose ASA/ NSAIDS reviewed. Ongoing dyspnea may not be completely related to anemia.  Further evaluation as per hospitalist. We will re-assess in the am.

## 2017-06-12 NOTE — Care Management Obs Status (Signed)
[facility] NOTIFICATION   Patient Details  Name: RENESMAY NESBITT MRN: [id number] Date of Birth: 12-14-1965   Medicare Observation Status Notification Given:  Yes    Kaysen Sefcik, Chauncey Reading, RN 06/12/2017, 10:11 AM

## 2017-06-13 ENCOUNTER — Encounter: Payer: Self-pay | Admitting: Internal Medicine

## 2017-06-13 ENCOUNTER — Encounter (HOSPITAL_COMMUNITY): Payer: Self-pay | Admitting: Gastroenterology

## 2017-06-13 DIAGNOSIS — D649 Anemia, unspecified: Secondary | ICD-10-CM | POA: Diagnosis not present

## 2017-06-13 DIAGNOSIS — R1310 Dysphagia, unspecified: Secondary | ICD-10-CM

## 2017-06-13 DIAGNOSIS — I5032 Chronic diastolic (congestive) heart failure: Secondary | ICD-10-CM | POA: Diagnosis not present

## 2017-06-13 DIAGNOSIS — D500 Iron deficiency anemia secondary to blood loss (chronic): Secondary | ICD-10-CM | POA: Diagnosis not present

## 2017-06-13 DIAGNOSIS — K921 Melena: Secondary | ICD-10-CM | POA: Diagnosis not present

## 2017-06-13 DIAGNOSIS — R1319 Other dysphagia: Secondary | ICD-10-CM

## 2017-06-13 LAB — GLUCOSE, CAPILLARY
Glucose-Capillary: 178 mg/dL — ABNORMAL HIGH (ref 65–99)
Glucose-Capillary: 220 mg/dL — ABNORMAL HIGH (ref 65–99)

## 2017-06-13 LAB — CBC
HEMATOCRIT: 34.6 % — AB (ref 36.0–46.0)
HEMOGLOBIN: 10.7 g/dL — AB (ref 12.0–15.0)
MCH: 26.6 pg (ref 26.0–34.0)
MCHC: 30.9 g/dL (ref 30.0–36.0)
MCV: 86.1 fL (ref 78.0–100.0)
Platelets: 401 10*3/uL — ABNORMAL HIGH (ref 150–400)
RBC: 4.02 MIL/uL (ref 3.87–5.11)
RDW: 15.2 % (ref 11.5–15.5)
WBC: 10.9 10*3/uL — ABNORMAL HIGH (ref 4.0–10.5)

## 2017-06-13 MED ORDER — CEFDINIR 300 MG PO CAPS: 300.0000 mg | ORAL_CAPSULE | Freq: Two times a day (BID) | ORAL | 0 refills | Status: DC

## 2017-06-13 NOTE — Discharge Summary (Signed)
Physician Discharge Summary  Rena Hunke Breslin OIB:[id number] DOB: 1965/08/22 DOA: 06/11/2017  PCP: Patient, No Pcp Per  Admit date: 06/11/2017 Discharge date: 06/13/2017  Admitted From: Home Disposition:  Home   Recommendations for Outpatient Follow-up:  1. Follow up with PCP in 1-2 weeks 2. Please obtain BMP/CBC in one week    Discharge Condition: Stable CODE STATUS: FULL Diet recommendation: Heart Healthy / Carb Modified    Brief/Interim Summary: 51 year old female with a history of diastolic CHF, diabetes mellitus, anxiety/panic attacks, tobacco abuse, and benzodiazepine dependence presenting with one-month history of fatigue, dyspnea on exertion, and generalized weakness.  The patient states that she has had intermittent hematochezia that she notices when she wipes for the past year.  She denies any melena, hematemesis, hematuria, dysuria.  Patient was recently discharged from OSH in [place], [place] where she was treated for pneumonia and acute diastolic CHF.  She was admitted from May 04, 2017 through May 09, 2017.  Approximately 1 week after discharge, she noted progressive dyspnea on exertion with some intermittent chest discomfort.  In addition, the patient endorses use of daily ibuprofen 600 mg twice daily for the past year.  She denied any vomiting, diarrhea, hematemesis, melena.  In the emergency department, the patient was noted to have hemoglobin of 7.9.  She was transfused 2 units PRBC.  GI was consulted and planned EGD and colonoscopy June 12, 2017.    Discharge Diagnoses:  Symptomatic anemia -May 05, 2017 hemoglobin 10.2 -May 08, 2017 hemoglobin 8.2--day of discharge from last hospitalization -Presenting hemoglobin 7.9 -Transfuse 2 units PRBC--hemoglobin remained stable after transfusion -Hemoglobin 10.7 on day of discharge -Iron saturation 6%, ferritin 63; serum X45--038; folic UEKC--0.0 -Concerned about NSAID induced peptic ulcer  disease -06/12/17--EGD--distal esophagitis--chemical versus pill induced--dilated and biopsied -06/12/17--colonoscopy--normal -Appreciate GI consult--> avoid NSAIDs, planning for a capsule endoscopy June 27, 2017; continue PPI daily Diabetes mellitus type 2 -May 05, 2017 hemoglobin A1c 7.2 -Holding metformin during hospitalization -NovoLog sliding scale  Chronic diastolic CHF -Appears euvolemic clinically -The patient normally sees the Dr. Einar Gip -She only takes Lasix as needed -May 05, 2017 echo--EF>75%, moderate MS, probable moderate left ear, trace TR  Dyspnea on exertion -Likely multifactorial including symptomatic anemia, tobacco abuse/underlying COPD, deconditioning -Oxygen saturation 99-100% on room air -Outpatient PFTs -Tobacco cessation discussed  UTI -UA with TNTC WBC -received ceftriaxone during hospitalization -home with cefdinir x 3 days to complete 5 days of tx  Panic attack/anxiety -Continue home dose alprazolam -The [place] Controlled Substance Reporting System has been queried for this patient for the past 24 months. -Patient last received alprazolam 1 mg, #150, prescribed by Dr. Rosine Door on May 21, 2017  Tobacco abuse -The patient quit 1 month ago -NicoDerm patch -She has nearly 50-pack-year history  Hyperlipidemia -Continue statin     Discharge Instructions   Allergies as of 06/13/2017      Reactions   Iodinated Diagnostic Agents Anaphylaxis   Sulfa Antibiotics Hives   Dilaudid [hydromorphone Hcl] Itching, Palpitations   Hydrocodone Palpitations      Medication List    STOP taking these medications   amoxicillin 500 MG capsule Commonly known as:  AMOXIL   azithromycin 250 MG tablet Commonly known as:  ZITHROMAX   ibuprofen 200 MG tablet Commonly known as:  ADVIL,MOTRIN     TAKE these medications   acetaminophen 500 MG tablet Commonly known as:  TYLENOL Take 1,000 mg by mouth every 6 (six) hours as  needed for mild pain or moderate pain.  ALPRAZolam 1 MG tablet Commonly known as:  XANAX Take 0.5 tablets (0.5 mg total) by mouth 3 (three) times daily. anxiety What changed:    how much to take  additional instructions   aspirin EC 81 MG tablet Take 81 mg by mouth daily.   cefdinir 300 MG capsule Commonly known as:  OMNICEF Take 1 capsule (300 mg total) by mouth 2 (two) times daily.   FLUoxetine 40 MG capsule Commonly known as:  PROZAC Take 80 mg by mouth daily.   furosemide 20 MG tablet Commonly known as:  LASIX Take 20 mg by mouth daily as needed for fluid.   metFORMIN 500 MG tablet Commonly known as:  GLUCOPHAGE Take 1 tablet (500 mg total) by mouth 2 (two) times daily with a meal.   NYQUIL SEVERE COLD/FLU 5-6.25-10-325 MG/15ML Liqd Generic drug:  Phenyleph-Doxylamine-DM-APAP Take 15 mLs by mouth at bedtime as needed.   omeprazole 20 MG capsule Commonly known as:  PRILOSEC Take 20 mg by mouth daily.   potassium chloride SA 20 MEQ tablet Commonly known as:  K-DUR,KLOR-CON Take 1 tablet by mouth daily as needed.   promethazine 25 MG tablet Commonly known as:  PHENERGAN Take 25 mg by mouth every 6 (six) hours as needed.   rosuvastatin 10 MG tablet Commonly known as:  CRESTOR Take 10 mg by mouth daily.   traZODone 50 MG tablet Commonly known as:  DESYREL Take 100 mg by mouth at bedtime.       Allergies  Allergen Reactions  . Iodinated Diagnostic Agents Anaphylaxis  . Sulfa Antibiotics Hives  . Dilaudid [Hydromorphone Hcl] Itching and Palpitations  . Hydrocodone Palpitations    Consultations:  GI   Procedures/Studies: Dg Chest 2 View  Result Date: 06/11/2017 CLINICAL DATA:  Cough, shortness of Breath EXAM: CHEST  2 VIEW COMPARISON:  02/08/2017 FINDINGS: Diffuse interstitial prominence throughout the lungs, likely chronic interstitial lung disease. Mild peribronchial thickening. Heart is normal size. No effusions or acute bony abnormality.  IMPRESSION: Mild peribronchial thickening and interstitial prominence, favor chronic interstitial lung disease. Electronically Signed   By: Rolm Baptise M.D.   On: 06/11/2017 14:32        Discharge Exam: Vitals:   06/13/17 0559 06/13/17 0728  BP: (!) 141/67   Pulse: 96   Resp: 16   Temp: 98.4 F (36.9 C)   SpO2: 96% 98%   Vitals:   06/12/17 2135 06/13/17 0500 06/13/17 0559 06/13/17 0728  BP: 129/62  (!) 141/67   Pulse: 84  96   Resp: 18  16   Temp: 98.3 F (36.8 C)  98.4 F (36.9 C)   TempSrc: Oral  Oral   SpO2: 95%  96% 98%  Weight:  78.7 kg (173 lb 6.4 oz)    Height:        General: Pt is alert, awake, not in acute distress Cardiovascular: RRR, S1/S2 +, no rubs, no gallops Respiratory: CTA bilaterally, no wheezing, no rhonchi Abdominal: Soft, NT, ND, bowel sounds + Extremities: no edema, no cyanosis   The results of significant diagnostics from this hospitalization (including imaging, microbiology, ancillary and laboratory) are listed below for reference.    Significant Diagnostic Studies: Dg Chest 2 View  Result Date: 06/11/2017 CLINICAL DATA:  Cough, shortness of Breath EXAM: CHEST  2 VIEW COMPARISON:  02/08/2017 FINDINGS: Diffuse interstitial prominence throughout the lungs, likely chronic interstitial lung disease. Mild peribronchial thickening. Heart is normal size. No effusions or acute bony abnormality. IMPRESSION: Mild peribronchial thickening and interstitial  prominence, favor chronic interstitial lung disease. Electronically Signed   By: Rolm Baptise M.D.   On: 06/11/2017 14:32     Microbiology: No results found for this or any previous visit (from the past 240 hour(s)).   Labs: Basic Metabolic Panel: Recent Labs  Lab 06/11/17 1340 06/12/17 0537  NA 136 139  K 4.2 4.0  CL 105 106  CO2 20* 20*  GLUCOSE 162* 141*  BUN 9 9  CREATININE 0.81 0.75  CALCIUM 9.0 9.4   Liver Function Tests: Recent Labs  Lab 06/11/17 1601  AST 23  ALT 24   ALKPHOS 85  BILITOT 0.4  PROT 7.2  ALBUMIN 3.5   No results for input(s): LIPASE, AMYLASE in the last 168 hours. No results for input(s): AMMONIA in the last 168 hours. CBC: Recent Labs  Lab 06/11/17 1340 06/12/17 0537 06/13/17 0831  WBC 9.4 8.3 10.9*  NEUTROABS 6.5 5.3  --   HGB 7.9* 10.6* 10.7*  HCT 26.0* 35.0* 34.6*  MCV 89.0 86.4 86.1  PLT 375 397 401*   Cardiac Enzymes: No results for input(s): CKTOTAL, CKMB, CKMBINDEX, TROPONINI in the last 168 hours. BNP: Invalid input(s): POCBNP CBG: Recent Labs  Lab 06/12/17 1117 06/12/17 1258 06/12/17 1703 06/12/17 2133 06/13/17 0737  GLUCAP 125* 127* 162* 165* 178*    Time coordinating discharge:  Greater than 30 minutes  Signed:  Orson Eva, DO [facility] Pager: [phone] 06/13/2017, 10:44 AM

## 2017-06-13 NOTE — Progress Notes (Signed)
Subjective:  Feels much better. Breathing good this morning. No SOB.   Objective: Vital signs in last 24 hours: Temp:  [97.9 F (36.6 C)-98.5 F (36.9 C)] 98.4 F (36.9 C) (12/20 0559) Pulse Rate:  [84-96] 96 (12/20 0559) Resp:  [16-19] 16 (12/20 0559) BP: (108-141)/(60-87) 141/67 (12/20 0559) SpO2:  [94 %-98 %] 98 % (12/20 0728) Weight:  [173 lb 6.4 oz (78.7 kg)-175 lb (79.4 kg)] 173 lb 6.4 oz (78.7 kg) (12/20 0500) Last BM Date: 06/12/17 General:   Alert,  Well-developed, well-nourished, pleasant and cooperative in NAD Head:  Normocephalic and atraumatic. Eyes:  Sclera clear, no icterus.  Chest: CTA bilaterally without rales, rhonchi, crackles.    Heart:  Regular rate and rhythm; no murmurs, clicks, rubs,  or gallops. Abdomen:  Soft, nontender and nondistended.  Normal bowel sounds, without guarding, and without rebound.   Extremities:  Without clubbing, deformity or edema. Neurologic:  Alert and  oriented x4;  grossly normal neurologically. Skin:  Intact without significant lesions or rashes. Psych:  Alert and cooperative. Normal mood and affect.  Intake/Output from previous day: 12/19 0701 - 12/20 0700 In: 910 [P.O.:360; I.V.:500; IV Piggyback:50] Out: 1700 [Urine:1700] Intake/Output this shift: No intake/output data recorded.  Lab Results: CBC Recent Labs    06/11/17 1340 06/12/17 0537  WBC 9.4 8.3  HGB 7.9* 10.6*  HCT 26.0* 35.0*  MCV 89.0 86.4  PLT 375 397   BMET Recent Labs    06/11/17 1340 06/12/17 0537  NA 136 139  K 4.2 4.0  CL 105 106  CO2 20* 20*  GLUCOSE 162* 141*  BUN 9 9  CREATININE 0.81 0.75  CALCIUM 9.0 9.4   LFTs Recent Labs    06/11/17 1601  BILITOT 0.4  BILIDIR 0.1  IBILI 0.3  ALKPHOS 85  AST 23  ALT 24  PROT 7.2  ALBUMIN 3.5   No results for input(s): LIPASE in the last 72 hours. PT/INR Recent Labs    06/11/17 1556  LABPROT 13.9  INR 1.08      Imaging Studies: Dg Chest 2 View  Result Date: 06/11/2017 CLINICAL  DATA:  Cough, shortness of Breath EXAM: CHEST  2 VIEW COMPARISON:  02/08/2017 FINDINGS: Diffuse interstitial prominence throughout the lungs, likely chronic interstitial lung disease. Mild peribronchial thickening. Heart is normal size. No effusions or acute bony abnormality. IMPRESSION: Mild peribronchial thickening and interstitial prominence, favor chronic interstitial lung disease. Electronically Signed   By: Rolm Baptise M.D.   On: 06/11/2017 14:32  [2 weeks]   Assessment: 51 year old female admitted with symptomatic anemia, Hgb 7.9. Hgb was around the 8 range at time of discharge from [facility] in Nov 2018; however, several months ago her Hgb was normal in epic (Aug 2018). She reports bright red blood with wiping but no melena. Endorses Ibuprofen routinely and recently started taking 325 mg aspirin daily. No prior colonoscopy or upper endoscopy. Family history is significant for brother diagnosed with colon cancer and succumbed to the disease in his early 78s.  EGD yesterday for dysphagia, anemia, hematochezia.  Distal esophagitis found, possibly chemical or pill induced.  Esophagus dilated and biopsied.  Small hiatal hernia.  No explanation for anemia.  Colonoscopy with normal distal terminal ileum, nonbleeding internal hemorrhoids/grade 1, nothing to adequately explain anemia or GI bleeding.  Dyspnea improved this morning. She reports seeing her cardiologist within past few weeks and told cardiac health stable. If recurrent dyspnea, consider etiologies other than anemia given her significant cardiopulmonary history.  Plan: 1. Continue PPI. 2. Outpatient givens capsule study planned for 06/27/17.  3. Avoid nsaids. Limit asa to 70m daily for cardiac reasons. Monitor for symptomatic anemia, melena, brbpr.  4. Consider migraine prophylaxis medication. 5. F/u path.   LLaureen Ochs LBernarda CaffeyRMonroe County HospitalGastroenterology Associates [phone]/201810:[id number] AM     LOS: 0 days

## 2017-06-13 NOTE — Progress Notes (Signed)
Stated has felt stronger today.  To be discahrged home today on ominicef.  Discharge papers signed and scripts sent to pharmacy.  Family member to drive home.  Walked out to car.

## 2017-06-14 LAB — URINE CULTURE: Culture: >=100000 — AB

## 2017-06-20 ENCOUNTER — Encounter (HOSPITAL_COMMUNITY): Payer: Self-pay | Admitting: Internal Medicine

## 2017-06-24 MED ORDER — IOPAMIDOL (ISOVUE-300) INJECTION 61%
INTRAVENOUS | Status: AC
  Filled 2017-06-24: qty 50

## 2017-06-26 NOTE — Telephone Encounter (Signed)
Veronica at pre-service center called office. She spoke to [place] at [facility]. Pt's benefits started over yesterday and she now requires PA for Givens Capsule Study that is scheduled for 06/27/17. Attempted to submit case via [employer]'s website, CPT code not found. Called [employer]. Case pending, ref# [id number]. Clinicals need to be faxed to [phone].  Called and informed pt. Givens capsule study cancelled for tomorrow. Will reschedule after approval is received. LMOVM for Endo scheduler. Called Endo, nurse will inform Threasa Beards. Called and informed Veronica at pre-service center.

## 2017-06-26 NOTE — Telephone Encounter (Signed)
Clinicals faxed to [phone].

## 2017-06-27 ENCOUNTER — Ambulatory Visit (HOSPITAL_COMMUNITY): Admission: RE | Admit: 2017-06-27 | Payer: Medicare FFS | Source: Ambulatory Visit | Admitting: Gastroenterology

## 2017-06-27 ENCOUNTER — Encounter (HOSPITAL_COMMUNITY): Admission: RE | Payer: Self-pay | Source: Ambulatory Visit

## 2017-06-27 SURGERY — IMAGING PROCEDURE, GI TRACT, INTRALUMINAL, VIA CAPSULE

## 2017-06-28 NOTE — Telephone Encounter (Signed)
Called [employer] to f/u on pending case. Spoke to clinical intake specialist (Vin). They received clinical info yesterday. Case is pending for review. Nurse will call the office with the outcome.

## 2017-07-01 ENCOUNTER — Other Ambulatory Visit: Payer: Self-pay

## 2017-07-01 DIAGNOSIS — D500 Iron deficiency anemia secondary to blood loss (chronic): Secondary | ICD-10-CM

## 2017-07-01 NOTE — Telephone Encounter (Signed)
Rose from [facility] called office. Givens capsule study is approved. PA# [id number], 06/27/17-07/27/17. Called and informed pt. Givens scheduled for 07/11/17 at 7:30am. Pt to arrive at 7:00am. Instructions mailed. Orders entered.

## 2017-07-11 ENCOUNTER — Encounter (HOSPITAL_COMMUNITY): Payer: Self-pay | Admitting: Emergency Medicine

## 2017-07-11 ENCOUNTER — Emergency Department (HOSPITAL_COMMUNITY)
Admission: EM | Admit: 2017-07-11 | Discharge: 2017-07-11 | Disposition: A | Discharge status: Home / Self Care | Payer: Medicare PPO | Attending: Emergency Medicine | Admitting: Emergency Medicine

## 2017-07-11 ENCOUNTER — Emergency Department (HOSPITAL_COMMUNITY): Payer: Medicare PPO

## 2017-07-11 ENCOUNTER — Telehealth: Payer: Self-pay

## 2017-07-11 ENCOUNTER — Ambulatory Visit (HOSPITAL_COMMUNITY)
Admission: RE | Admit: 2017-07-11 | Discharge: 2017-07-11 | Disposition: A | Discharge status: Home / Self Care | Payer: Medicare PPO | Source: Ambulatory Visit | Attending: Gastroenterology | Admitting: Gastroenterology

## 2017-07-11 ENCOUNTER — Encounter (HOSPITAL_COMMUNITY)
Admission: RE | Disposition: A | Discharge status: Home / Self Care | Payer: Self-pay | Source: Ambulatory Visit | Attending: Gastroenterology

## 2017-07-11 DIAGNOSIS — M6281 Muscle weakness (generalized): Secondary | ICD-10-CM | POA: Diagnosis not present

## 2017-07-11 DIAGNOSIS — K625 Hemorrhage of anus and rectum: Secondary | ICD-10-CM | POA: Insufficient documentation

## 2017-07-11 DIAGNOSIS — I5032 Chronic diastolic (congestive) heart failure: Secondary | ICD-10-CM | POA: Insufficient documentation

## 2017-07-11 DIAGNOSIS — D649 Anemia, unspecified: Secondary | ICD-10-CM

## 2017-07-11 DIAGNOSIS — Z8541 Personal history of malignant neoplasm of cervix uteri: Secondary | ICD-10-CM | POA: Diagnosis not present

## 2017-07-11 DIAGNOSIS — Z87891 Personal history of nicotine dependence: Secondary | ICD-10-CM | POA: Diagnosis not present

## 2017-07-11 DIAGNOSIS — R42 Dizziness and giddiness: Secondary | ICD-10-CM | POA: Diagnosis present

## 2017-07-11 DIAGNOSIS — R05 Cough: Secondary | ICD-10-CM | POA: Diagnosis not present

## 2017-07-11 DIAGNOSIS — Z79899 Other long term (current) drug therapy: Secondary | ICD-10-CM | POA: Insufficient documentation

## 2017-07-11 DIAGNOSIS — R531 Weakness: Secondary | ICD-10-CM

## 2017-07-11 DIAGNOSIS — E119 Type 2 diabetes mellitus without complications: Secondary | ICD-10-CM | POA: Diagnosis not present

## 2017-07-11 DIAGNOSIS — Z7982 Long term (current) use of aspirin: Secondary | ICD-10-CM | POA: Insufficient documentation

## 2017-07-11 DIAGNOSIS — Z5309 Procedure and treatment not carried out because of other contraindication: Secondary | ICD-10-CM

## 2017-07-11 DIAGNOSIS — R0602 Shortness of breath: Secondary | ICD-10-CM | POA: Diagnosis not present

## 2017-07-11 LAB — URINALYSIS, ROUTINE W REFLEX MICROSCOPIC
Bilirubin Urine: NEGATIVE
Glucose, UA: NEGATIVE mg/dL
Hgb urine dipstick: NEGATIVE
KETONES UR: NEGATIVE mg/dL
Nitrite: NEGATIVE
PROTEIN: NEGATIVE mg/dL
Specific Gravity, Urine: 1.006 (ref 1.005–1.030)
pH: 6 (ref 5.0–8.0)

## 2017-07-11 LAB — BASIC METABOLIC PANEL
ANION GAP: 11 (ref 5–15)
BUN: 6 mg/dL (ref 6–20)
CALCIUM: 9.5 mg/dL (ref 8.9–10.3)
CO2: 21 mmol/L — AB (ref 22–32)
CREATININE: 0.8 mg/dL (ref 0.44–1.00)
Chloride: 102 mmol/L (ref 101–111)
GFR calc Af Amer: >60 mL/min (ref >60)
GFR calc non Af Amer: >60 mL/min (ref >60)
GLUCOSE: 188 mg/dL — AB (ref 65–99)
Potassium: 3.9 mmol/L (ref 3.5–5.1)
Sodium: 134 mmol/L — ABNORMAL LOW (ref 135–145)

## 2017-07-11 LAB — CBC WITH DIFFERENTIAL/PLATELET
BASOS ABS: 0 10*3/uL (ref 0.0–0.1)
BASOS PCT: 0 %
EOS PCT: 4 %
Eosinophils Absolute: 0.4 10*3/uL (ref 0.0–0.7)
HCT: 30.8 % — ABNORMAL LOW (ref 36.0–46.0)
Hemoglobin: 9.6 g/dL — ABNORMAL LOW (ref 12.0–15.0)
LYMPHS PCT: 20 %
Lymphs Abs: 2.1 10*3/uL (ref 0.7–4.0)
MCH: 27.9 pg (ref 26.0–34.0)
MCHC: 31.2 g/dL (ref 30.0–36.0)
MCV: 89.5 fL (ref 78.0–100.0)
MONO ABS: 0.4 10*3/uL (ref 0.1–1.0)
MONOS PCT: 4 %
Neutro Abs: 7.6 10*3/uL (ref 1.7–7.7)
Neutrophils Relative %: 72 %
PLATELETS: 371 10*3/uL (ref 150–400)
RBC: 3.44 MIL/uL — ABNORMAL LOW (ref 3.87–5.11)
RDW: 16.8 % — AB (ref 11.5–15.5)
WBC: 10.6 10*3/uL — ABNORMAL HIGH (ref 4.0–10.5)

## 2017-07-11 LAB — HEPATIC FUNCTION PANEL
ALBUMIN: 3.8 g/dL (ref 3.5–5.0)
ALT: 43 U/L (ref 14–54)
AST: 38 U/L (ref 15–41)
Alkaline Phosphatase: 96 U/L (ref 38–126)
BILIRUBIN TOTAL: 0.3 mg/dL (ref 0.3–1.2)
Bilirubin, Direct: <0.1 mg/dL — ABNORMAL LOW (ref 0.1–0.5)
TOTAL PROTEIN: 8.4 g/dL — AB (ref 6.5–8.1)

## 2017-07-11 LAB — LIPASE, BLOOD: LIPASE: 21 U/L (ref 11–51)

## 2017-07-11 LAB — TROPONIN I

## 2017-07-11 SURGERY — IMAGING PROCEDURE, GI TRACT, INTRALUMINAL, VIA CAPSULE

## 2017-07-11 MED ORDER — ONDANSETRON 4 MG PO TBDP
4.0000 mg | ORAL_TABLET | Freq: Once | ORAL | Status: AC
Start: 2017-07-11 — End: 2017-07-11
  Administered 2017-07-11: 4 mg via ORAL
  Filled 2017-07-11: qty 1

## 2017-07-11 MED ORDER — FERROUS SULFATE 325 (65 FE) MG PO TABS: 325.0000 mg | ORAL_TABLET | Freq: Every day | ORAL | 0 refills | Status: DC

## 2017-07-11 MED ORDER — ACETAMINOPHEN 325 MG PO TABS
650.0000 mg | ORAL_TABLET | Freq: Once | ORAL | Status: AC
  Administered 2017-07-11: 650 mg via ORAL
  Filled 2017-07-11: qty 2

## 2017-07-11 MED ORDER — ALBUTEROL SULFATE HFA 108 (90 BASE) MCG/ACT IN AERS
2.0000 | INHALATION_SPRAY | Freq: Once | RESPIRATORY_TRACT | Status: AC
  Administered 2017-07-11: 2 via RESPIRATORY_TRACT
  Filled 2017-07-11: qty 6.7

## 2017-07-11 NOTE — Telephone Encounter (Signed)
[facility]olly [facility] at [facility] called office. Pt had Givens capsule scheduled for this morning. She ate supper last night and drank Sprite this morning. She also didn't stop her Aspirin. Holly Figueroa called Dr. Oneida Figueroa and she advised to reschedule Givens. Pt had her Givens instructions with her. [facility] rescheduled Givens to 07/19/17. She changed the dates on pt's instruction sheet. Pt said she thought her blood was low so she was going to the ER. New Givens instructions mailed to pt.

## 2017-07-11 NOTE — Telephone Encounter (Signed)
NO NEED TO STOP ASPIRIN.

## 2017-07-11 NOTE — Discharge Instructions (Signed)
Your labs, ekg and chest xray are stable today.  You will greatly benefit from smoking cessation and it was recommended you have Pulmonary Function Testing (PFTs) done as an outpatient (your primary doctor should arrange this) upon your last discharge from this hospital which will help to diagnose COPD (emphysema) which is suspected as the source of your shortness of breath.  You have not had a heart attack based on todays tests.  Your red blood cell count is stable - you do not need a blood transfusion today.  You are being prescribed an iron supplement to help your body replace your red blood cells.  Keep your procedure  as scheduled next week with Dr Oneida Alar.  You may try using 2 puffs of the albuterol inhaler every 4 hours if you are short of breath. Your oxygen saturation decreases when you are sleeping suggesting you may also have sleep apnea.  You should also discuss with your doctor about getting tested for this condition.

## 2017-07-11 NOTE — ED Triage Notes (Signed)
Pt reports she was supposed to have a procedure done today to see where her GI bleed is coming from, but she did not understand instructions about being NPO.  Decided to come be seen to make sure she's not anemic and also has not checked glucose in several days.

## 2017-07-11 NOTE — Telephone Encounter (Signed)
Called and informed pt she doesn't need to stop Aspirin prior to Givens. New instructions mailed.

## 2017-07-11 NOTE — ED Notes (Addendum)
02 2l [place] applied. Pt satting around 88 with good pleth. PA aware

## 2017-07-11 NOTE — OR Nursing (Signed)
Patient arrived and stated she ate supper last night at 1800. Patient has been drinking Sprite all morning. Dr. Oneida Alar notified and said to reschedule patient's procedure. Patient's procedure rescheduled for Friday, July 19, 2017 at 0700. Patient given new instructions and verbalized understanding.

## 2017-07-11 NOTE — ED Provider Notes (Signed)
[facility] EMERGENCY DEPARTMENT Provider Note   CSN: [id number] Arrival date & time: 07/11/17  0759     History   Chief Complaint Chief Complaint  Patient presents with  . Dizziness    HPI Holly Figueroa is a 52 y.o. female with a history of diabetes, CHF, suspected COPD with a history of acute respiratory failure and most recently with a GI bleed of unclear etiology as recent admission for blood transfusions given symptomatic anemia including endoscopy and colonoscopy were nondiagnostic for the source of bleeding although she did have some distal esophageal inflammation, presenting today with persistent generalized weakness, lightheadedness with walking and standing along with complaints of bilateral chest pressure which is been constant for the past 4 days.  She denies fevers or chills, she endorses a persistent nonproductive cough and is a daily smoker.  She presented this morning for a scheduled GI capsule procedure but misunderstood the n.p.o. instructions, so had to be rescheduled for 1 week.  She has persistent rectal bleeding which is not worse describing bright red blood on the toilet tissue with bowel movements.  The history is provided by the patient and a friend.    Past Medical History:  Diagnosis Date  . Acute respiratory failure ([place])   . Anemia 08-2008   Blood transfusion  . Anxiety   . Benzodiazepine dependence ([place])   . Cervical cancer ([place])   . CHF (congestive heart failure) ([place])   . Depression   . Diabetes mellitus without complication ([place])   . Headache(784.0)    migraines  . Hypokalemia   . Legionella pneumonia ([place])   . Leukocytosis, unspecified   . Mitral stenosis   . Panic attacks   . Tobacco abuse     Patient Active Problem List   Diagnosis Date Noted  . Iron deficiency anemia due to chronic blood loss   . Esophageal dysphagia   . Chronic diastolic CHF (congestive heart failure) ([place]) 06/12/2017  . Hematochezia 06/12/2017  . Symptomatic  anemia 06/11/2017  . ARDS (adult respiratory distress syndrome) ([place]) 10/30/2014  . Bilateral pneumonia 10/29/2014  . CAP (community acquired pneumonia) 10/29/2014  . Hypokalemia   . Hypoxia   . UTI (lower urinary tract infection)   . Tobacco abuse 10/11/2010  . Acute respiratory failure ([place]) 10/11/2010    Past Surgical History:  Procedure Laterality Date  . BIOPSY  06/12/2017   Procedure: BIOPSY;  Surgeon: Daneil Dolin, MD;  Location: [facility];  Service: Gastroenterology;;  esophagus  . cardiac valve repair  2011   stretched mitral valve  . CERVICAL CONIZATION W/BX  2000  . CHOLECYSTECTOMY  06/26/2012   Procedure: LAPAROSCOPIC CHOLECYSTECTOMY WITH INTRAOPERATIVE CHOLANGIOGRAM;  Surgeon: Ralene Ok, MD;  Location: [facility] ORS;  Service: General;  Laterality: N/A;  . COLONOSCOPY WITH PROPOFOL N/A 06/12/2017   Procedure: COLONOSCOPY WITH PROPOFOL;  Surgeon: Daneil Dolin, MD;  Location: [facility];  Service: Gastroenterology;  Laterality: N/A;  . ESOPHAGOGASTRODUODENOSCOPY (EGD) WITH PROPOFOL N/A 06/12/2017   Procedure: ESOPHAGOGASTRODUODENOSCOPY (EGD) WITH PROPOFOL;  Surgeon: Daneil Dolin, MD;  Location: [facility];  Service: Gastroenterology;  Laterality: N/A;  . LASER ABLATION OF THE CERVIX    . MALONEY DILATION  06/12/2017   Procedure: MALONEY DILATION;  Surgeon: Daneil Dolin, MD;  Location: [facility];  Service: Gastroenterology;;  . MOLE REMOVAL    . NASAL SINUS SURGERY      OB History    No data available  Home Medications    Prior to Admission medications   Medication Sig Start Date End Date Taking? Authorizing Provider  acetaminophen (TYLENOL) 500 MG tablet Take 1,000 mg by mouth every 12 (twelve) hours as needed for mild pain or moderate pain.    Yes [provider]  ALPRAZolam Duanne Moron) 1 MG tablet Take 0.5 tablets (0.5 mg total) by mouth 3 (three) times daily. anxiety Patient taking differently: Take 1 mg by mouth 3 (three)  times daily. anxiety 11/08/14  Yes Ollis, Velna Hatchet L, NP  aspirin EC 81 MG tablet Take 81 mg by mouth daily.   Yes [provider]  Cholecalciferol (VITAMIN D-3) 5000 units TABS Take 5,000 Units by mouth daily.   Yes [provider]  Cyanocobalamin (VITAMIN B-12 PO) Take 1 tablet by mouth daily.   Yes [provider]  esomeprazole (NEXIUM) 40 MG capsule Take 40 mg by mouth daily at 12 noon.   Yes [provider]  FLUoxetine (PROZAC) 40 MG capsule Take 80 mg by mouth daily.    Yes [provider]  furosemide (LASIX) 20 MG tablet Take 20 mg by mouth daily as needed for fluid.   Yes [provider]  metFORMIN (GLUCOPHAGE) 500 MG tablet Take 1 tablet (500 mg total) by mouth 2 (two) times daily with a meal. 02/08/17  Yes Ray, Andee Poles, MD  Phenyleph-Doxylamine-DM-APAP (NYQUIL SEVERE COLD/FLU) 5-6.25-10-325 MG/15ML LIQD Take 15 mLs by mouth at bedtime as needed (cold symptoms).    Yes [provider]  potassium chloride SA (K-DUR,KLOR-CON) 20 MEQ tablet Take 1 tablet by mouth daily as needed (when taken with Lasix for fluid).  12/04/10  Yes [provider]  rosuvastatin (CRESTOR) 10 MG tablet Take 10 mg by mouth daily. 04/15/17  Yes [provider]  traZODone (DESYREL) 50 MG tablet Take 100 mg by mouth at bedtime.    Yes [provider]  vitamin C (ASCORBIC ACID) 500 MG tablet Take 500 mg by mouth daily.   Yes [provider]  cefdinir (OMNICEF) 300 MG capsule Take 1 capsule (300 mg total) by mouth 2 (two) times daily. 06/13/17   Orson Eva, MD  ferrous sulfate 325 (65 FE) MG tablet Take 1 tablet (325 mg total) by mouth daily. 07/11/17   Evalee Jefferson, PA-C    Family History Family History  Problem Relation Age of Onset  . Heart disease Father   . COPD Father   . Heart failure Maternal Grandmother   . Stroke Mother   . Colon cancer Brother 96       deceased   . Colon polyps Neg Hx     Social  History Social History   Tobacco Use  . Smoking status: Former Smoker    Packs/day: 1.00    Years: 23.00    Pack years: 23.00    Types: Cigarettes    Last attempt to quit: 05/06/2017    Years since quitting: 0.1  . Smokeless tobacco: Never Used  Substance Use Topics  . Alcohol use: No    Alcohol/week: 0.0 oz  . Drug use: No     Allergies   Iodinated diagnostic agents; Aspirin; Ibuprofen; Sulfa antibiotics; Dilaudid [hydromorphone hcl]; and Hydrocodone   Review of Systems Review of Systems  Constitutional: Negative for appetite change and fever.  HENT: Negative for congestion and sore throat.   Eyes: Negative.   Respiratory: Positive for cough and shortness of breath. Negative for chest tightness and wheezing.   Cardiovascular: Positive for chest pain.  Negative for palpitations and leg swelling.  Gastrointestinal: Positive for blood in stool and nausea. Negative for abdominal pain and vomiting.  Genitourinary: Negative.   Musculoskeletal: Negative for arthralgias, joint swelling and neck pain.  Skin: Negative.  Negative for rash and wound.  Neurological: Negative for dizziness, weakness, light-headedness, numbness and headaches.  Psychiatric/Behavioral: Negative.      Physical Exam Updated Vital Signs BP (!) 98/47   Pulse 86   Temp 97.8 F (36.6 C) (Oral)   Resp 18   Ht _0  (1.676 m)   Wt 79.4 kg (175 lb)   SpO2 (!) 86%   BMI 28.25 kg/m   Physical Exam  Constitutional: She appears well-developed and well-nourished.  HENT:  Head: Normocephalic and atraumatic.  Eyes: Conjunctivae are normal.  Neck: Normal range of motion.  Cardiovascular: Normal rate, regular rhythm, normal heart sounds and intact distal pulses.  No ankle edema  Pulmonary/Chest: Effort normal and breath sounds normal. No stridor. No respiratory distress. She has no wheezes.  Abdominal: Soft. Bowel sounds are normal. There is no tenderness.  Musculoskeletal: Normal range of motion. She  exhibits no edema.  Neurological: She is alert.  Skin: Skin is warm and dry.  Psychiatric: She has a normal mood and affect.  Nursing note and vitals reviewed.    ED Treatments / Results  Labs (all labs ordered are listed, but only abnormal results are displayed) Labs Reviewed  CBC WITH DIFFERENTIAL/PLATELET - Abnormal; Notable for the following components:      Result Value   WBC 10.6 (*)    RBC 3.44 (*)    Hemoglobin 9.6 (*)    HCT 30.8 (*)    RDW 16.8 (*)    All other components within normal limits  BASIC METABOLIC PANEL - Abnormal; Notable for the following components:   Sodium 134 (*)    CO2 21 (*)    Glucose, Bld 188 (*)    All other components within normal limits  URINALYSIS, ROUTINE W REFLEX MICROSCOPIC - Abnormal; Notable for the following components:   APPearance HAZY (*)    Leukocytes, UA SMALL (*)    Bacteria, UA FEW (*)    Squamous Epithelial / LPF 6-30 (*)    Non Squamous Epithelial 0-5 (*)    All other components within normal limits  HEPATIC FUNCTION PANEL - Abnormal; Notable for the following components:   Total Protein 8.4 (*)    Bilirubin, Direct <0.1 (*)    All other components within normal limits  TROPONIN I  LIPASE, BLOOD    EKG  EKG Interpretation  Date/Time:  Thursday July 11 2017 10:53:56 EST Ventricular Rate:  80 PR Interval:    QRS Duration: 98 QT Interval:  402 QTC Calculation: 464 R Axis:   34 Text Interpretation:  Sinus rhythm Probable left atrial enlargement since last tracing no significant change Confirmed by Daleen Bo [phone]) on 07/11/2017 12:56:39 PM       Radiology Dg Chest 2 View  Result Date: 07/11/2017 CLINICAL DATA:  Shortness of breath.  History of cervical carcinoma EXAM: CHEST  2 VIEW COMPARISON:  June 11, 2017 FINDINGS: There is central peribronchial thickening, stable from prior study. There is no edema or consolidation. The heart size and pulmonary vascularity are normal. No adenopathy. No evident  bone lesions. IMPRESSION: Evidence of a degree of chronic central bronchitis. No edema or consolidation. Stable cardiac silhouette. Electronically Signed   By: Lowella Grip III M.D.   On: 07/11/2017 11:14  Procedures Procedures (including critical care time)  Medications Ordered in ED Medications  albuterol (PROVENTIL HFA;VENTOLIN HFA) 108 (90 Base) MCG/ACT inhaler 2 puff (not administered)  ondansetron (ZOFRAN-ODT) disintegrating tablet 4 mg (4 mg Oral Given 07/11/17 1117)     Initial Impression / Assessment and Plan / ED Course  I have reviewed the triage vital signs and the nursing notes.  Pertinent labs & imaging results that were available during my care of the patient were reviewed by me and considered in my medical decision making (see chart for details).     Pt was to have GI camera procedure this am, postponed x 1 week due to noncompliance with npo instructions.  She has stable labs today, not orthostatic.  No respiratory distress.  She does desat to upper 80's when asleep, quickly returns to normal saturation when awake.  She was given an albuterol mdi with spacer, no active wheezing but has strong h/o smoking with suspected copd, I think this will help with her sob, no evidence of chf per exam and imaging, pending outpatient pft's.  4 day h/o chest pressure, negative troponin and stable ekg.  PRn f/u anticipated.  Plan recheck by pcp and to discuss outpatient PFT testing.  Also reminded to follow instructions and plan for her next GI test next week as arranged by Dr Oneida Alar.  Hgb stable today, no indication for additional transfusions at this time.   Discussed with Dr. Eulis Foster prior to dc home.  Final Clinical Impressions(s) / ED Diagnoses   Final diagnoses:  Weakness  Anemia, unspecified type  Shortness of breath    ED Discharge Orders        Ordered    ferrous sulfate 325 (65 FE) MG tablet  Daily     07/11/17 1314       Evalee Jefferson, PA-C 07/11/17 1315     Daleen Bo, MD 07/11/17 1557

## 2017-07-11 NOTE — ED Notes (Signed)
RT aware pt needing instructions on inhaler

## 2017-07-19 ENCOUNTER — Encounter (HOSPITAL_COMMUNITY)
Admission: RE | Disposition: A | Discharge status: Home / Self Care | Payer: Self-pay | Source: Ambulatory Visit | Attending: Gastroenterology

## 2017-07-19 ENCOUNTER — Ambulatory Visit (HOSPITAL_COMMUNITY)
Admission: RE | Admit: 2017-07-19 | Discharge: 2017-07-19 | Disposition: A | Discharge status: Home / Self Care | Payer: Medicare PPO | Source: Ambulatory Visit | Attending: Gastroenterology | Admitting: Gastroenterology

## 2017-07-19 DIAGNOSIS — D649 Anemia, unspecified: Secondary | ICD-10-CM | POA: Diagnosis not present

## 2017-07-19 DIAGNOSIS — D62 Acute posthemorrhagic anemia: Secondary | ICD-10-CM | POA: Insufficient documentation

## 2017-07-19 HISTORY — PX: GIVENS CAPSULE STUDY: SHX5432

## 2017-07-19 SURGERY — IMAGING PROCEDURE, GI TRACT, INTRALUMINAL, VIA CAPSULE

## 2017-07-22 ENCOUNTER — Encounter (HOSPITAL_COMMUNITY): Payer: Self-pay | Admitting: Gastroenterology

## 2017-07-29 ENCOUNTER — Telehealth: Payer: Self-pay | Admitting: Gastroenterology

## 2017-07-29 NOTE — Procedures (Signed)
   PATIENT DATA: GASTRIC PASSAGE TIME: 2h  23m UNABLE TO CALCULATE DUE TO CAPSULE DID NOT REACH CECUM  RESULTS: LIMITED views of gastric mucosa due to retained contents.  No ULCERS, masses or AVMs seen IN THE SMALL BOWEL. RARE LYMPHANGECTASIA. No old blood or fresh blood in the stomach, small bowel, or colon.  DIAGNOSIS: NORMAL, BUT LIMITED DUE TO INCOMPLETE GIVENS STUDY  Plan: 1. HEMATOLOGY REFERRAL FOR NORMOCYTIC ANEMIA 2. OPV IN 4 MOS E30 ANEMIA

## 2017-07-29 NOTE — Telephone Encounter (Signed)
PLEASE CALL PT. HER GIVENS STUDY IS NORMAL, BUT INCOMPLETE BECAUSE THE GIVENS CAPSULE DID NOT MAKE IT TO HER COLON DURING THE STUDY.  SHE NEEDS: 1. TO SEE HEMATOLOGY FOR NORMOCYTIC ANEMIA 2. A KUB THIS WEEK TO CONFIRM CAPSULE HAS PASSED. IF IT HAS SHE WILL THEN NEED  AN EGD W/ MAC TO PLACE CAPSULE IN HER SMALL BOWEL TO OBTAIN A COMPLETE STUDY. 3. OPV IN 4 MOS E30 ANEMIA

## 2017-07-29 NOTE — Telephone Encounter (Signed)
ON RECALL  °

## 2017-07-30 ENCOUNTER — Other Ambulatory Visit: Payer: Self-pay

## 2017-07-30 DIAGNOSIS — D649 Anemia, unspecified: Secondary | ICD-10-CM

## 2017-07-30 NOTE — Telephone Encounter (Signed)
Pt is aware. OK to refer to Hematology and schedule KUB.

## 2017-07-30 NOTE — Telephone Encounter (Signed)
Referral sent to Hematology via Epic. Order entered for KUB (DG abd 1 view). Pt doesn't need appt for KUB. Tried to call pt, no answer, LMOVM. Informed pt no appt is needed for KUB and SLF requests she have it done this week. Hematology will call her to schedule appt.

## 2017-07-30 NOTE — Telephone Encounter (Signed)
LMOM to call.

## 2017-08-19 ENCOUNTER — Encounter (HOSPITAL_COMMUNITY): Payer: Self-pay | Admitting: Emergency Medicine

## 2017-08-19 ENCOUNTER — Emergency Department (HOSPITAL_COMMUNITY): Payer: Medicare PPO

## 2017-08-19 ENCOUNTER — Inpatient Hospital Stay (HOSPITAL_COMMUNITY)
Admission: EM | Admit: 2017-08-19 | Discharge: 2017-08-21 | DRG: 194 | Disposition: A | Discharge status: Home / Self Care | Payer: Medicare PPO | Attending: Family Medicine | Admitting: Family Medicine

## 2017-08-19 ENCOUNTER — Other Ambulatory Visit: Payer: Self-pay

## 2017-08-19 ENCOUNTER — Inpatient Hospital Stay (HOSPITAL_COMMUNITY): Payer: Medicare PPO | Attending: Internal Medicine | Admitting: Internal Medicine

## 2017-08-19 DIAGNOSIS — F419 Anxiety disorder, unspecified: Secondary | ICD-10-CM | POA: Diagnosis present

## 2017-08-19 DIAGNOSIS — Z825 Family history of asthma and other chronic lower respiratory diseases: Secondary | ICD-10-CM | POA: Diagnosis not present

## 2017-08-19 DIAGNOSIS — J9600 Acute respiratory failure, unspecified whether with hypoxia or hypercapnia: Secondary | ICD-10-CM | POA: Diagnosis not present

## 2017-08-19 DIAGNOSIS — R55 Syncope and collapse: Secondary | ICD-10-CM | POA: Diagnosis present

## 2017-08-19 DIAGNOSIS — Z8249 Family history of ischemic heart disease and other diseases of the circulatory system: Secondary | ICD-10-CM

## 2017-08-19 DIAGNOSIS — E876 Hypokalemia: Secondary | ICD-10-CM | POA: Diagnosis not present

## 2017-08-19 DIAGNOSIS — F17210 Nicotine dependence, cigarettes, uncomplicated: Secondary | ICD-10-CM | POA: Diagnosis present

## 2017-08-19 DIAGNOSIS — R579 Shock, unspecified: Secondary | ICD-10-CM | POA: Diagnosis not present

## 2017-08-19 DIAGNOSIS — J9601 Acute respiratory failure with hypoxia: Secondary | ICD-10-CM | POA: Diagnosis not present

## 2017-08-19 DIAGNOSIS — Z800 Family history of malignant neoplasm of digestive organs: Secondary | ICD-10-CM | POA: Diagnosis not present

## 2017-08-19 DIAGNOSIS — J101 Influenza due to other identified influenza virus with other respiratory manifestations: Secondary | ICD-10-CM | POA: Diagnosis not present

## 2017-08-19 DIAGNOSIS — K920 Hematemesis: Secondary | ICD-10-CM | POA: Diagnosis not present

## 2017-08-19 DIAGNOSIS — K209 Esophagitis, unspecified: Secondary | ICD-10-CM | POA: Diagnosis present

## 2017-08-19 DIAGNOSIS — J189 Pneumonia, unspecified organism: Secondary | ICD-10-CM

## 2017-08-19 DIAGNOSIS — Z7984 Long term (current) use of oral hypoglycemic drugs: Secondary | ICD-10-CM

## 2017-08-19 DIAGNOSIS — R0603 Acute respiratory distress: Secondary | ICD-10-CM | POA: Diagnosis not present

## 2017-08-19 DIAGNOSIS — A419 Sepsis, unspecified organism: Secondary | ICD-10-CM | POA: Diagnosis not present

## 2017-08-19 DIAGNOSIS — J9602 Acute respiratory failure with hypercapnia: Secondary | ICD-10-CM | POA: Diagnosis not present

## 2017-08-19 DIAGNOSIS — R1310 Dysphagia, unspecified: Secondary | ICD-10-CM | POA: Diagnosis not present

## 2017-08-19 DIAGNOSIS — K648 Other hemorrhoids: Secondary | ICD-10-CM | POA: Diagnosis present

## 2017-08-19 DIAGNOSIS — I340 Nonrheumatic mitral (valve) insufficiency: Secondary | ICD-10-CM | POA: Diagnosis not present

## 2017-08-19 DIAGNOSIS — Z823 Family history of stroke: Secondary | ICD-10-CM

## 2017-08-19 DIAGNOSIS — F329 Major depressive disorder, single episode, unspecified: Secondary | ICD-10-CM | POA: Diagnosis present

## 2017-08-19 DIAGNOSIS — R042 Hemoptysis: Secondary | ICD-10-CM | POA: Diagnosis not present

## 2017-08-19 DIAGNOSIS — J810 Acute pulmonary edema: Secondary | ICD-10-CM | POA: Diagnosis not present

## 2017-08-19 DIAGNOSIS — J80 Acute respiratory distress syndrome: Secondary | ICD-10-CM | POA: Diagnosis not present

## 2017-08-19 DIAGNOSIS — E1165 Type 2 diabetes mellitus with hyperglycemia: Secondary | ICD-10-CM | POA: Diagnosis present

## 2017-08-19 DIAGNOSIS — D649 Anemia, unspecified: Secondary | ICD-10-CM | POA: Diagnosis present

## 2017-08-19 DIAGNOSIS — K625 Hemorrhage of anus and rectum: Secondary | ICD-10-CM | POA: Diagnosis not present

## 2017-08-19 DIAGNOSIS — R109 Unspecified abdominal pain: Secondary | ICD-10-CM

## 2017-08-19 DIAGNOSIS — D72829 Elevated white blood cell count, unspecified: Secondary | ICD-10-CM

## 2017-08-19 DIAGNOSIS — I050 Rheumatic mitral stenosis: Secondary | ICD-10-CM | POA: Diagnosis present

## 2017-08-19 DIAGNOSIS — Z7982 Long term (current) use of aspirin: Secondary | ICD-10-CM | POA: Diagnosis not present

## 2017-08-19 DIAGNOSIS — N390 Urinary tract infection, site not specified: Secondary | ICD-10-CM | POA: Diagnosis present

## 2017-08-19 DIAGNOSIS — D539 Nutritional anemia, unspecified: Secondary | ICD-10-CM | POA: Diagnosis present

## 2017-08-19 DIAGNOSIS — R6521 Severe sepsis with septic shock: Secondary | ICD-10-CM | POA: Diagnosis not present

## 2017-08-19 LAB — TYPE AND SCREEN
ABO/RH(D): O POS
Antibody Screen: NEGATIVE

## 2017-08-19 LAB — URINALYSIS, ROUTINE W REFLEX MICROSCOPIC
Bilirubin Urine: NEGATIVE
Glucose, UA: NEGATIVE mg/dL
Hgb urine dipstick: NEGATIVE
KETONES UR: NEGATIVE mg/dL
Nitrite: POSITIVE — AB
PH: 5 (ref 5.0–8.0)
PROTEIN: NEGATIVE mg/dL
Specific Gravity, Urine: 1.011 (ref 1.005–1.030)

## 2017-08-19 LAB — CBC
HCT: 29.8 % — ABNORMAL LOW (ref 36.0–46.0)
HEMOGLOBIN: 8.9 g/dL — AB (ref 12.0–15.0)
MCH: 27.1 pg (ref 26.0–34.0)
MCHC: 29.9 g/dL — AB (ref 30.0–36.0)
MCV: 90.9 fL (ref 78.0–100.0)
Platelets: 453 10*3/uL — ABNORMAL HIGH (ref 150–400)
RBC: 3.28 MIL/uL — ABNORMAL LOW (ref 3.87–5.11)
RDW: 14.8 % (ref 11.5–15.5)
WBC: 11.3 10*3/uL — ABNORMAL HIGH (ref 4.0–10.5)

## 2017-08-19 LAB — COMPREHENSIVE METABOLIC PANEL
ALBUMIN: 3.7 g/dL (ref 3.5–5.0)
ALK PHOS: 93 U/L (ref 38–126)
ALT: 28 U/L (ref 14–54)
ANION GAP: 13 (ref 5–15)
AST: 31 U/L (ref 15–41)
BILIRUBIN TOTAL: 0.3 mg/dL (ref 0.3–1.2)
BUN: 8 mg/dL (ref 6–20)
CO2: 21 mmol/L — ABNORMAL LOW (ref 22–32)
Calcium: 9.5 mg/dL (ref 8.9–10.3)
Chloride: 106 mmol/L (ref 101–111)
Creatinine, Ser: 0.8 mg/dL (ref 0.44–1.00)
GFR calc non Af Amer: >60 mL/min (ref >60)
GLUCOSE: 197 mg/dL — AB (ref 65–99)
Potassium: 3.3 mmol/L — ABNORMAL LOW (ref 3.5–5.1)
Sodium: 140 mmol/L (ref 135–145)
TOTAL PROTEIN: 8.2 g/dL — AB (ref 6.5–8.1)

## 2017-08-19 LAB — POC OCCULT BLOOD, ED: FECAL OCCULT BLD: POSITIVE — AB

## 2017-08-19 LAB — CBG MONITORING, ED: GLUCOSE-CAPILLARY: 208 mg/dL — AB (ref 65–99)

## 2017-08-19 LAB — GLUCOSE, CAPILLARY
GLUCOSE-CAPILLARY: 143 mg/dL — AB (ref 65–99)
GLUCOSE-CAPILLARY: 165 mg/dL — AB (ref 65–99)

## 2017-08-19 MED ORDER — FLUOXETINE HCL 20 MG PO CAPS
80.0000 mg | ORAL_CAPSULE | Freq: Every day | ORAL | Status: DC
  Administered 2017-08-20 – 2017-08-21 (×2): 80 mg via ORAL
  Filled 2017-08-19 (×2): qty 4

## 2017-08-19 MED ORDER — PANTOPRAZOLE SODIUM 40 MG IV SOLR
INTRAVENOUS | Status: AC
  Filled 2017-08-19: qty 80

## 2017-08-19 MED ORDER — SODIUM CHLORIDE 0.9 % IV SOLN
1.0000 g | INTRAVENOUS | Status: DC
  Administered 2017-08-20 – 2017-08-21 (×2): 1 g via INTRAVENOUS
  Filled 2017-08-19: qty 10
  Filled 2017-08-19: qty 1
  Filled 2017-08-19 (×2): qty 10

## 2017-08-19 MED ORDER — ALPRAZOLAM 0.5 MG PO TABS
0.5000 mg | ORAL_TABLET | Freq: Three times a day (TID) | ORAL | Status: DC
  Administered 2017-08-19 – 2017-08-21 (×5): 0.5 mg via ORAL
  Filled 2017-08-19 (×5): qty 1

## 2017-08-19 MED ORDER — ACETAMINOPHEN 650 MG RE SUPP: 650.0000 mg | Freq: Four times a day (QID) | RECTAL | Status: DC | PRN

## 2017-08-19 MED ORDER — SODIUM CHLORIDE 0.9 % IV BOLUS (SEPSIS)
1000.0000 mL | Freq: Once | INTRAVENOUS | Status: AC
  Administered 2017-08-19: 1000 mL via INTRAVENOUS

## 2017-08-19 MED ORDER — POTASSIUM CHLORIDE 10 MEQ/100ML IV SOLN
10.0000 meq | Freq: Once | INTRAVENOUS | Status: AC
  Administered 2017-08-19: 10 meq via INTRAVENOUS
  Filled 2017-08-19: qty 100

## 2017-08-19 MED ORDER — SODIUM CHLORIDE 0.9 % IV SOLN
80.0000 mg | Freq: Once | INTRAVENOUS | Status: AC
  Administered 2017-08-19: 20:00:00 80 mg via INTRAVENOUS
  Filled 2017-08-19: qty 80

## 2017-08-19 MED ORDER — ACETAMINOPHEN 325 MG PO TABS: 650.0000 mg | ORAL_TABLET | Freq: Four times a day (QID) | ORAL | Status: DC | PRN

## 2017-08-19 MED ORDER — INSULIN ASPART 100 UNIT/ML ~~LOC~~ SOLN
0.0000 [IU] | SUBCUTANEOUS | Status: DC
  Administered 2017-08-19 – 2017-08-20 (×3): 2 [IU] via SUBCUTANEOUS
  Administered 2017-08-20: 3 [IU] via SUBCUTANEOUS
  Administered 2017-08-20: 2 [IU] via SUBCUTANEOUS
  Administered 2017-08-20: 1 [IU] via SUBCUTANEOUS
  Administered 2017-08-21: 2 [IU] via SUBCUTANEOUS
  Administered 2017-08-21: 1 [IU] via SUBCUTANEOUS
  Administered 2017-08-21: 2 [IU] via SUBCUTANEOUS

## 2017-08-19 MED ORDER — POTASSIUM CHLORIDE IN NACL 20-0.9 MEQ/L-% IV SOLN
INTRAVENOUS | Status: DC
  Administered 2017-08-19: via INTRAVENOUS

## 2017-08-19 MED ORDER — SODIUM CHLORIDE 0.9 % IV SOLN
8.0000 mg/h | INTRAVENOUS | Status: DC
  Administered 2017-08-19: 8 mg/h via INTRAVENOUS
  Filled 2017-08-19 (×5): qty 80

## 2017-08-19 MED ORDER — ALBUTEROL SULFATE (2.5 MG/3ML) 0.083% IN NEBU: 3.0000 mL | INHALATION_SOLUTION | Freq: Four times a day (QID) | RESPIRATORY_TRACT | Status: DC | PRN

## 2017-08-19 NOTE — H&P (Signed)
TRH H&P   Patient Demographics:    Holly Figueroa, is a 52 y.o. female  MRN: [id number]   DOB - March 28, 1966  Admit Date - 08/19/2017  Outpatient Primary MD for the patient is Keane Police, MD  Referring MD/NP/PA:  Angelina Ok  Outpatient Specialists:    Dr. Sydell Axon (GI)  Patient coming from:  home  Chief Complaint  Patient presents with  . Loss of Consciousness      HPI:    Holly Figueroa  is a 52 y.o. female, w DM2, mitral stenosis (mild - mod) CHF (EF 55-60%), apparently c/o syncope on Sunday,  Pt was sitting on ground when this occurred,  No presyncopal symptoms.  Pt denies seizure activity.  Pt states uncertain length of duration.  Pt presented today due to hematemesis x1.  Pt also notes some rectal bleeding when wipes with toilet paper there is blood on toilet paper.  Pt denies fever, chills, abd pain, diarrhea, black stool.   Pt presented due to hematamesis today.   In ED,  CXR  IMPRESSION: Cardiomegaly. Peribronchial thickening compatible with bronchitic changes. Interstitial prominence could be related to bronchitis or mild interstitial edema.  Na 140, K 3.3  Glucose 197 Bun 8, Creatinine 0.8 Ast 31, Alt 28  Wbc 11.3, Hgb 8.9, Plt 453 FOBT positive  Urinalysis wbc Tntc  Pt will be admitted for syncope, UTI, and hematemesis x1, and anemia.    Review of systems:    In addition to the HPI above,  No Fever-chills, No Headache, No changes with Vision or hearing, No problems swallowing food or Liquids, No Chest pain, Cough or Shortness of Breath, No Abdominal pain, No Blood in stool or Urine, Slight dysuria.  No new skin rashes or bruises, No new joints pains-aches,  No new weakness, tingling, numbness in any extremity, No recent weight gain or loss, No polyuria, polydypsia or polyphagia, No significant Mental Stressors.  A full 10 point  Review of Systems was done, except as stated above, all other Review of Systems were negative.   With Past History of the following :    Past Medical History:  Diagnosis Date  . Acute respiratory failure ([employer])   . Anemia 08-2008   Blood transfusion  . Anxiety   . Benzodiazepine dependence ([place])   . Cervical cancer ([place])   . CHF (congestive heart failure) ([place])   . Depression   . Diabetes mellitus without complication ([place])   . Headache(784.0)    migraines  . Hypokalemia   . Legionella pneumonia ([place])   . Leukocytosis, unspecified   . Mitral stenosis   . Panic attacks   . Tobacco abuse       Past Surgical History:  Procedure Laterality Date  . BIOPSY  06/12/2017   Procedure: BIOPSY;  Surgeon: Daneil Dolin, MD;  Location: [facility];  Service: Gastroenterology;;  esophagus  .  cardiac valve repair  2011   stretched mitral valve  . CERVICAL CONIZATION W/BX  2000  . CHOLECYSTECTOMY  06/26/2012   Procedure: LAPAROSCOPIC CHOLECYSTECTOMY WITH INTRAOPERATIVE CHOLANGIOGRAM;  Surgeon: Ralene Ok, MD;  Location: [facility];  Service: General;  Laterality: N/A;  . COLONOSCOPY WITH PROPOFOL N/A 06/12/2017   Procedure: COLONOSCOPY WITH PROPOFOL;  Surgeon: Daneil Dolin, MD;  Location: [facility];  Service: Gastroenterology;  Laterality: N/A;  . ESOPHAGOGASTRODUODENOSCOPY (EGD) WITH PROPOFOL N/A 06/12/2017   Procedure: ESOPHAGOGASTRODUODENOSCOPY (EGD) WITH PROPOFOL;  Surgeon: Daneil Dolin, MD;  Location: [facility];  Service: Gastroenterology;  Laterality: N/A;  . GIVENS CAPSULE STUDY N/A 07/19/2017   Procedure: GIVENS CAPSULE STUDY;  Surgeon: Danie Binder, MD;  Location: [facility];  Service: Endoscopy;  Laterality: N/A;  . LASER ABLATION OF THE CERVIX    . MALONEY DILATION  06/12/2017   Procedure: MALONEY DILATION;  Surgeon: Daneil Dolin, MD;  Location: [facility];  Service: Gastroenterology;;  . MOLE REMOVAL    . NASAL SINUS SURGERY        Social  History:     Social History   Tobacco Use  . Smoking status: Current Every Day Smoker    Packs/day: 1.00    Years: 23.00    Pack years: 23.00    Types: Cigarettes    Last attempt to quit: 05/06/2017    Years since quitting: 0.2  . Smokeless tobacco: Never Used  Substance Use Topics  . Alcohol use: No    Alcohol/week: 0.0 oz     Lives - at home  Mobility - walks by self   Family History :     Family History  Problem Relation Age of Onset  . Heart disease Father   . COPD Father   . Heart failure Maternal Grandmother   . Stroke Mother   . Colon cancer Brother 18       deceased   . Colon polyps Neg Hx       Home Medications:   Prior to Admission medications   Medication Sig Start Date End Date Taking? Authorizing Provider  albuterol (PROVENTIL HFA;VENTOLIN HFA) 108 (90 Base) MCG/ACT inhaler Inhale 1-2 puffs into the lungs every 6 (six) hours as needed for wheezing or shortness of breath (with Aerochamber).   Yes [provider]  ALPRAZolam Duanne Moron) 1 MG tablet Take 0.5 tablets (0.5 mg total) by mouth 3 (three) times daily. anxiety Patient taking differently: Take 1 mg by mouth 3 (three) times daily. anxiety 11/08/14  Yes Ollis, Velna Hatchet L, NP  aspirin EC 81 MG tablet Take 81 mg by mouth daily.   Yes [provider]  Cholecalciferol (VITAMIN D-3) 5000 units TABS Take 5,000 Units by mouth daily.   Yes [provider]  Cyanocobalamin (VITAMIN B-12 PO) Take 1 tablet by mouth daily.   Yes [provider]  ferrous sulfate 325 (65 FE) MG tablet Take 1 tablet (325 mg total) by mouth daily. 07/11/17  Yes Idol, Almyra Free, PA-C  FLUoxetine (PROZAC) 40 MG capsule Take 80 mg by mouth daily.    Yes [provider]  furosemide (LASIX) 20 MG tablet Take 20 mg by mouth daily as needed for fluid.   Yes [provider]  metFORMIN (GLUCOPHAGE) 500 MG tablet Take 1 tablet (500 mg total) by mouth 2 (two) times daily with a meal. Patient taking  differently: Take 1,000 mg by mouth 2 (two) times daily with a meal.  02/08/17  Yes  Pattricia Boss, MD  ondansetron [facility]) 4 MG tablet Take 4 mg by mouth every 8 (eight) hours as needed for nausea or vomiting.  08/16/17  Yes [provider]  pantoprazole (PROTONIX) 40 MG tablet Take 40 mg by mouth daily.   Yes [provider]  potassium chloride SA (K-DUR,KLOR-CON) 20 MEQ tablet Take 1 tablet by mouth daily as needed (when taken with Lasix for fluid).  12/04/10  Yes [provider]  rosuvastatin (CRESTOR) 10 MG tablet Take 10 mg by mouth daily. 04/15/17  Yes [provider]  Spacer/Aero-Holding Chambers (AEROCHAMBER PLUS FLO-VU MEDIUM) MISC 1 each by Other route every 6 (six) hours as needed.   Yes [provider]  traZODone (DESYREL) 50 MG tablet Take 100 mg by mouth at bedtime.    Yes [provider]  vitamin C (ASCORBIC ACID) 500 MG tablet Take 500 mg by mouth daily.   Yes [provider]  cefdinir (OMNICEF) 300 MG capsule Take 1 capsule (300 mg total) by mouth 2 (two) times daily. Patient not taking: Reported on 08/19/2017 06/13/17   Orson Eva, MD     Allergies:     Allergies  Allergen Reactions  . Iodinated Diagnostic Agents Anaphylaxis  . Aspirin Other (See Comments)    "Possible blood in stool" the 363m. Can take 816m  . Ibuprofen Other (See Comments)    "Possible blood in stool"  . Sulfa Antibiotics Hives  . Dilaudid [Hydromorphone Hcl] Itching and Palpitations  . Hydrocodone Palpitations     Physical Exam:   Vitals  Blood pressure 109/70, pulse 95, temperature 98.2 F (36.8 C), temperature source Oral, resp. rate 19, height _0  (1.676 m), weight 82.1 kg (181 lb), SpO2 97 %.   1. General  lying in bed in NAD,   2. Normal affect and insight, Not Suicidal or Homicidal, Awake Alert, Oriented X 3.  3. No F.N deficits, ALL C.Nerves Intact, Strength 5/5 all 4 extremities, Sensation intact all 4 extremities,  Plantars down going.  4. Ears and Eyes appear Normal, Conjunctivae clear, PERRLA. Moist Oral Mucosa.  5. Supple Neck, No JVD, No cervical lymphadenopathy appriciated, No Carotid Bruits.  6. Symmetrical Chest wall movement, Good air movement bilaterally, CTAB.  7. RRR, No Gallops, Rubs or Murmurs, No Parasternal Heave.  8. Positive Bowel Sounds, Abdomen Soft, No tenderness, No organomegaly appriciated,No rebound -guarding or rigidity.  9.  No Cyanosis, Normal Skin Turgor, No Skin Rash or Bruise.  10. Good muscle tone,  joints appear normal , no effusions, Normal ROM.  11. No Palpable Lymph Nodes in Neck or Axillae  No cva tenderness   Data Review:    CBC Recent Labs  Lab 08/19/17 1355  WBC 11.3*  HGB 8.9*  HCT 29.8*  PLT 453*  MCV 90.9  MCH 27.1  MCHC 29.9*  RDW 14.8   ------------------------------------------------------------------------------------------------------------------  Chemistries  Recent Labs  Lab 08/19/17 1355  NA 140  K 3.3*  CL 106  CO2 21*  GLUCOSE 197*  BUN 8  CREATININE 0.80  CALCIUM 9.5  AST 31  ALT 28  ALKPHOS 93  BILITOT 0.3   ------------------------------------------------------------------------------------------------------------------ estimated creatinine clearance is 90.8 mL/min (by C-G formula based on SCr of 0.8 mg/dL). ------------------------------------------------------------------------------------------------------------------ No results for input(s): TSH, T4TOTAL, T3FREE, THYROIDAB in the last 72 hours.  Invalid input(s): FREET3  Coagulation profile No results for input(s): INR, PROTIME in the last 168 hours. ------------------------------------------------------------------------------------------------------------------- No results for input(s): DDIMER in the last 72 hours. -------------------------------------------------------------------------------------------------------------------  Cardiac  Enzymes No  results for input(s): CKMB, TROPONINI, MYOGLOBIN in the last 168 hours.  Invalid input(s): CK ------------------------------------------------------------------------------------------------------------------    Component Value Date/Time   BNP 242.8 (H) 11/03/2014 0800     ---------------------------------------------------------------------------------------------------------------  Urinalysis    Component Value Date/Time   COLORURINE YELLOW 08/19/2017 1854   APPEARANCEUR CLEAR 08/19/2017 1854   LABSPEC 1.011 08/19/2017 1854   PHURINE 5.0 08/19/2017 1854   GLUCOSEU NEGATIVE 08/19/2017 1854   HGBUR NEGATIVE 08/19/2017 1854   BILIRUBINUR NEGATIVE 08/19/2017 1854   KETONESUR NEGATIVE 08/19/2017 1854   PROTEINUR NEGATIVE 08/19/2017 1854   UROBILINOGEN 0.2 10/28/2014 2330   NITRITE POSITIVE (A) 08/19/2017 1854   LEUKOCYTESUR SMALL (A) 08/19/2017 1854    ----------------------------------------------------------------------------------------------------------------   Imaging Results:    Dg Chest Port 1 View  Result Date: 08/19/2017 CLINICAL DATA:  Cough, syncope EXAM: PORTABLE CHEST 1 VIEW COMPARISON:  07/11/2017 FINDINGS: Cardiomegaly. Peribronchial thickening. Interstitial prominence in the lungs could reflect interstitial edema or bronchitic changes. No effusions or acute bony abnormality. IMPRESSION: Cardiomegaly. Peribronchial thickening compatible with bronchitic changes. Interstitial prominence could be related to bronchitis or mild interstitial edema. Electronically Signed   By: Rolm Baptise M.D.   On: 08/19/2017 19:22       Assessment & Plan:    Principal Problem:   Hematemesis Active Problems:   Hypokalemia   Anemia   Syncope    Hematemesis NPO Ns iv Protonix 78m iv x1, and 814mhr Check cbc serially GI consultation  Syncope Tele Trop I q6h x3 Carotid ultrasound Cardiac echo  UTI Await urine culture Start rocephin 1gm iv  qday   Hypokalemia replete Bmp in am  ANemia Cbc in am  DVT Prophylaxis  SCDs  AM Labs Ordered, also please review Full Orders  Family Communication: Admission, patients condition and plan of care including tests being ordered have been discussed with the patient  who indicate understanding and agree with the plan and Code Status.  Code Status FULL CODE  Likely DC to  home  Condition GUARDED  Consults called: cardiology  Admission status: inpatient  Time spent in minutes : 45   JaJani Gravel.D on 08/19/2017 at 8:17 PM  Between 7am to 7pm - Pager - [phone]. After 7pm go to [email or web address] - password [email or web address] - Office  [phone]

## 2017-08-19 NOTE — ED Provider Notes (Addendum)
[facility] EMERGENCY DEPARTMENT Provider Note   CSN: [id number] Arrival date & time: 08/19/17  1333     History   Chief Complaint Chief Complaint  Patient presents with  . Loss of Consciousness    HPI Holly Figueroa is a 52 y.o. female.  HPI states she has had 3 syncopal events in the past 2 days.  Unknown how long she is been unconscious for.  She complains of generalized weakness worse with standing up.  Other associated symptoms include hematemesis which occurred as she was coughing, posttussive vomiting and red blood in stools, as well as hematuria, and diffuse headache lightheadedness worse with standing and improved with lying down.  No fever.  No treatment prior to coming here.  Past Medical History:  Diagnosis Date  . Acute respiratory failure ([place])   . Anemia 08-2008   Blood transfusion  . Anxiety   . Benzodiazepine dependence ([place])   . Cervical cancer ([place])   . CHF (congestive heart failure) ([place])   . Depression   . Diabetes mellitus without complication ([employer])   . Headache(784.0)    migraines  . Hypokalemia   . Legionella pneumonia ([place])   . Leukocytosis, unspecified   . Mitral stenosis   . Panic attacks   . Tobacco abuse     Patient Active Problem List   Diagnosis Date Noted  . Iron deficiency anemia due to chronic blood loss   . Esophageal dysphagia   . Chronic diastolic CHF (congestive heart failure) ([place]) 06/12/2017  . Hematochezia 06/12/2017  . Symptomatic anemia 06/11/2017  . ARDS (adult respiratory distress syndrome) ([place]) 10/30/2014  . Bilateral pneumonia 10/29/2014  . CAP (community acquired pneumonia) 10/29/2014  . Hypokalemia   . Hypoxia   . UTI (lower urinary tract infection)   . Tobacco abuse 10/11/2010  . Acute respiratory failure ([place]) 10/11/2010    Past Surgical History:  Procedure Laterality Date  . BIOPSY  06/12/2017   Procedure: BIOPSY;  Surgeon: Daneil Dolin, MD;  Location: [facility];  Service: Gastroenterology;;   esophagus  . cardiac valve repair  2011   stretched mitral valve  . CERVICAL CONIZATION W/BX  2000  . CHOLECYSTECTOMY  06/26/2012   Procedure: LAPAROSCOPIC CHOLECYSTECTOMY WITH INTRAOPERATIVE CHOLANGIOGRAM;  Surgeon: Ralene Ok, MD;  Location: [facility];  Service: General;  Laterality: N/A;  . COLONOSCOPY WITH PROPOFOL N/A 06/12/2017   Procedure: COLONOSCOPY WITH PROPOFOL;  Surgeon: Daneil Dolin, MD;  Location: [facility];  Service: Gastroenterology;  Laterality: N/A;  . ESOPHAGOGASTRODUODENOSCOPY (EGD) WITH PROPOFOL N/A 06/12/2017   Procedure: ESOPHAGOGASTRODUODENOSCOPY (EGD) WITH PROPOFOL;  Surgeon: Daneil Dolin, MD;  Location: [facility];  Service: Gastroenterology;  Laterality: N/A;  . GIVENS CAPSULE STUDY N/A 07/19/2017   Procedure: GIVENS CAPSULE STUDY;  Surgeon: Danie Binder, MD;  Location: [facility];  Service: Endoscopy;  Laterality: N/A;  . LASER ABLATION OF THE CERVIX    . MALONEY DILATION  06/12/2017   Procedure: MALONEY DILATION;  Surgeon: Daneil Dolin, MD;  Location: [facility];  Service: Gastroenterology;;  . MOLE REMOVAL    . NASAL SINUS SURGERY      OB History    No data available       Home Medications    Prior to Admission medications   Medication Sig Start Date End Date Taking? Authorizing Provider  acetaminophen (TYLENOL) 500 MG tablet Take 1,000 mg by mouth every 12 (twelve) hours as needed for mild pain or moderate pain.  [provider]  ALPRAZolam Duanne Moron) 1 MG tablet Take 0.5 tablets (0.5 mg total) by mouth 3 (three) times daily. anxiety Patient taking differently: Take 1 mg by mouth 3 (three) times daily. anxiety 11/08/14   Donita Brooks, NP  aspirin EC 81 MG tablet Take 81 mg by mouth daily.    [provider]  cefdinir (OMNICEF) 300 MG capsule Take 1 capsule (300 mg total) by mouth 2 (two) times daily. 06/13/17   Orson Eva, MD  Cholecalciferol (VITAMIN D-3) 5000 units TABS Take 5,000 Units by mouth daily.     [provider]  Cyanocobalamin (VITAMIN B-12 PO) Take 1 tablet by mouth daily.    [provider]  esomeprazole (NEXIUM) 40 MG capsule Take 40 mg by mouth daily at 12 noon.    [provider]  ferrous sulfate 325 (65 FE) MG tablet Take 1 tablet (325 mg total) by mouth daily. 07/11/17   Evalee Jefferson, PA-C  FLUoxetine (PROZAC) 40 MG capsule Take 80 mg by mouth daily.     [provider]  furosemide (LASIX) 20 MG tablet Take 20 mg by mouth daily as needed for fluid.    [provider]  metFORMIN (GLUCOPHAGE) 500 MG tablet Take 1 tablet (500 mg total) by mouth 2 (two) times daily with a meal. 02/08/17   Pattricia Boss, MD  Phenyleph-Doxylamine-DM-APAP (NYQUIL SEVERE COLD/FLU) 5-6.25-10-325 MG/15ML LIQD Take 15 mLs by mouth at bedtime as needed (cold symptoms).     [provider]  potassium chloride SA (K-DUR,KLOR-CON) 20 MEQ tablet Take 1 tablet by mouth daily as needed (when taken with Lasix for fluid).  12/04/10   [provider]  rosuvastatin (CRESTOR) 10 MG tablet Take 10 mg by mouth daily. 04/15/17   [provider]  traZODone (DESYREL) 50 MG tablet Take 100 mg by mouth at bedtime.     [provider]  vitamin C (ASCORBIC ACID) 500 MG tablet Take 500 mg by mouth daily.    [provider]    Family History Family History  Problem Relation Age of Onset  . Heart disease Father   . COPD Father   . Heart failure Maternal Grandmother   . Stroke Mother   . Colon cancer Brother 40       deceased   . Colon polyps Neg Hx     Social History Social History   Tobacco Use  . Smoking status: Current Every Day Smoker    Packs/day: 1.00    Years: 23.00    Pack years: 23.00    Types: Cigarettes    Last attempt to quit: 05/06/2017    Years since quitting: 0.2  . Smokeless tobacco: Never Used  Substance Use Topics  . Alcohol use: No    Alcohol/week: 0.0 oz  . Drug use: No     Allergies   Iodinated  diagnostic agents; Aspirin; Ibuprofen; Sulfa antibiotics; Dilaudid [hydromorphone hcl]; and Hydrocodone   Review of Systems Review of Systems  Respiratory: Positive for cough.   Gastrointestinal: Positive for abdominal pain and vomiting.       Posttussive vomiting  Genitourinary: Positive for hematuria.       Postmenopausal  Allergic/Immunologic: Positive for immunocompromised state.       Diabetic  Neurological: Positive for light-headedness and headaches.  All other systems reviewed and are negative.    Physical Exam Updated Vital Signs BP 109/70 (BP Location: Left Arm)   Pulse 95   Temp 98.2 F (36.8 C) (Oral)  Resp 19   Ht _0  (1.676 m)   Wt 82.1 kg (181 lb)   SpO2 97%   BMI 29.21 kg/m   Physical Exam  Constitutional: She is oriented to person, place, and time. She appears well-developed and well-nourished. No distress.  HENT:  Head: Normocephalic and atraumatic.  Eyes: Conjunctivae are normal. Pupils are equal, round, and reactive to light.  Neck: Neck supple. No tracheal deviation present. No thyromegaly present.  Cardiovascular: Normal rate and regular rhythm.  No murmur heard. Pulmonary/Chest: Effort normal and breath sounds normal.  Abdominal: Soft. Bowel sounds are normal. She exhibits no distension. There is tenderness.  Minimally tender at epigastrium  Genitourinary: Rectal exam shows guaiac positive stool.  Genitourinary Comments: Rectum normal tone brown stool which is blood-tinged  Musculoskeletal: Normal range of motion. She exhibits no edema or tenderness.  Neurological: She is alert and oriented to person, place, and time. No cranial nerve deficit. Coordination normal.  Skin: Skin is warm and dry. No rash noted.  Psychiatric: She has a normal mood and affect.  Nursing note and vitals reviewed.    ED Treatments / Results  Labs (all labs ordered are listed, but only abnormal results are displayed) Labs Reviewed  COMPREHENSIVE METABOLIC PANEL -  Abnormal; Notable for the following components:      Result Value   Potassium 3.3 (*)    CO2 21 (*)    Glucose, Bld 197 (*)    Total Protein 8.2 (*)    All other components within normal limits  CBC - Abnormal; Notable for the following components:   WBC 11.3 (*)    RBC 3.28 (*)    Hemoglobin 8.9 (*)    HCT 29.8 (*)    MCHC 29.9 (*)    Platelets 453 (*)    All other components within normal limits  CBG MONITORING, ED - Abnormal; Notable for the following components:   Glucose-Capillary 208 (*)    All other components within normal limits  POC OCCULT BLOOD, ED - Abnormal; Notable for the following components:   Fecal Occult Bld POSITIVE (*)    All other components within normal limits  TYPE AND SCREEN   Chest x-ray viewed by me Results for orders placed or performed during the hospital encounter of 08/19/17  Comprehensive metabolic panel  Result Value Ref Range   Sodium 140 135 - 145 mmol/L   Potassium 3.3 (L) 3.5 - 5.1 mmol/L   Chloride 106 101 - 111 mmol/L   CO2 21 (L) 22 - 32 mmol/L   Glucose, Bld 197 (H) 65 - 99 mg/dL   BUN 8 6 - 20 mg/dL   Creatinine, Ser 0.80 0.44 - 1.00 mg/dL   Calcium 9.5 8.9 - 10.3 mg/dL   Total Protein 8.2 (H) 6.5 - 8.1 g/dL   Albumin 3.7 3.5 - 5.0 g/dL   AST 31 15 - 41 U/L   ALT 28 14 - 54 U/L   Alkaline Phosphatase 93 38 - 126 U/L   Total Bilirubin 0.3 0.3 - 1.2 mg/dL   GFR calc non Af Amer >60 >60 mL/min   GFR calc Af Amer >60 >60 mL/min   Anion gap 13 5 - 15  CBC  Result Value Ref Range   WBC 11.3 (H) 4.0 - 10.5 K/uL   RBC 3.28 (L) 3.87 - 5.11 MIL/uL   Hemoglobin 8.9 (L) 12.0 - 15.0 g/dL   HCT 29.8 (L) 36.0 - 46.0 %   MCV 90.9 78.0 - 100.0 fL  MCH 27.1 26.0 - 34.0 pg   MCHC 29.9 (L) 30.0 - 36.0 g/dL   RDW 14.8 11.5 - 15.5 %   Platelets 453 (H) 150 - 400 K/uL  Urinalysis, Routine w reflex microscopic  Result Value Ref Range   Color, Urine YELLOW YELLOW   APPearance CLEAR CLEAR   Specific Gravity, Urine 1.011 1.005 - 1.030   pH  5.0 5.0 - 8.0   Glucose, UA NEGATIVE NEGATIVE mg/dL   Hgb urine dipstick NEGATIVE NEGATIVE   Bilirubin Urine NEGATIVE NEGATIVE   Ketones, ur NEGATIVE NEGATIVE mg/dL   Protein, ur NEGATIVE NEGATIVE mg/dL   Nitrite POSITIVE (A) NEGATIVE   Leukocytes, UA SMALL (A) NEGATIVE   RBC / HPF 0-5 0 - 5 RBC/hpf   WBC, UA TOO NUMEROUS TO COUNT 0 - 5 WBC/hpf   Bacteria, UA MANY (A) NONE SEEN   Squamous Epithelial / LPF 0-5 (A) NONE SEEN   Mucus PRESENT   CBG monitoring, ED  Result Value Ref Range   Glucose-Capillary 208 (H) 65 - 99 mg/dL  POC occult blood, ED  Result Value Ref Range   Fecal Occult Bld POSITIVE (A) NEGATIVE  Type and screen [facility]  Result Value Ref Range   ABO/RH(D) O POS    Antibody Screen NEG    Sample Expiration      08/22/2017 Performed at [facility], [address] [place], [place] [address]    Dg Chest Port 1 View  Result Date: 08/19/2017 CLINICAL DATA:  Cough, syncope EXAM: PORTABLE CHEST 1 VIEW COMPARISON:  07/11/2017 FINDINGS: Cardiomegaly. Peribronchial thickening. Interstitial prominence in the lungs could reflect interstitial edema or bronchitic changes. No effusions or acute bony abnormality. IMPRESSION: Cardiomegaly. Peribronchial thickening compatible with bronchitic changes. Interstitial prominence could be related to bronchitis or mild interstitial edema. Electronically Signed   By: Rolm Baptise M.D.   On: 08/19/2017 19:22   EKG  EKG Interpretation  Date/Time:  Monday August 19 2017 13:50:01 EST Ventricular Rate:  95 PR Interval:  160 QRS Duration: 82 QT Interval:  376 QTC Calculation: 472 R Axis:   5 Text Interpretation:  Normal sinus rhythm Possible Left atrial enlargement Left ventricular hypertrophy Abnormal ECG No significant change since last tracing Confirmed by Orlie Dakin [phone]) on 08/19/2017 6:48:59 PM      Results for orders placed or performed during the hospital encounter of 08/19/17  Comprehensive metabolic panel    Result Value Ref Range   Sodium 140 135 - 145 mmol/L   Potassium 3.3 (L) 3.5 - 5.1 mmol/L   Chloride 106 101 - 111 mmol/L   CO2 21 (L) 22 - 32 mmol/L   Glucose, Bld 197 (H) 65 - 99 mg/dL   BUN 8 6 - 20 mg/dL   Creatinine, Ser 0.80 0.44 - 1.00 mg/dL   Calcium 9.5 8.9 - 10.3 mg/dL   Total Protein 8.2 (H) 6.5 - 8.1 g/dL   Albumin 3.7 3.5 - 5.0 g/dL   AST 31 15 - 41 U/L   ALT 28 14 - 54 U/L   Alkaline Phosphatase 93 38 - 126 U/L   Total Bilirubin 0.3 0.3 - 1.2 mg/dL   GFR calc non Af Amer >60 >60 mL/min   GFR calc Af Amer >60 >60 mL/min   Anion gap 13 5 - 15  CBC  Result Value Ref Range   WBC 11.3 (H) 4.0 - 10.5 K/uL   RBC 3.28 (L) 3.87 - 5.11 MIL/uL   Hemoglobin 8.9 (L) 12.0 -  15.0 g/dL   HCT 29.8 (L) 36.0 - 46.0 %   MCV 90.9 78.0 - 100.0 fL   MCH 27.1 26.0 - 34.0 pg   MCHC 29.9 (L) 30.0 - 36.0 g/dL   RDW 14.8 11.5 - 15.5 %   Platelets 453 (H) 150 - 400 K/uL  CBG monitoring, ED  Result Value Ref Range   Glucose-Capillary 208 (H) 65 - 99 mg/dL  POC occult blood, ED  Result Value Ref Range   Fecal Occult Bld POSITIVE (A) NEGATIVE  Type and screen [facility]  Result Value Ref Range   ABO/RH(D) O POS    Antibody Screen NEG    Sample Expiration      08/22/2017 Performed at [facility], [address], [place], [place] [address]    No results found. Radiology No results found.  Procedures Procedures (including critical care time)  Medications Ordered in ED Medications  sodium chloride 0.9 % bolus 1,000 mL (not administered)  pantoprazole (PROTONIX) 80 mg in sodium chloride 0.9 % 100 mL IVPB (not administered)  potassium chloride 10 mEq in 100 mL IVPB (not administered)     Initial Impression / Assessment and Plan / ED Course  I have reviewed the triage vital signs and the nursing notes.  Pertinent labs & imaging results that were available during my care of the patient were reviewed by me and considered in my medical decision making (see chart for  details).     Concern for syncopal events.  Etiology unclear.  However patient likely orthostatic and that she gets lightheaded with standing.  Blood-tinged stools.  And drop of hemoglobin of less than 1 g since 1 month.  I have consulted Dr. Maudie Mercury who will arrange for overnight stay.  Suggest GI consultation.  She seen Dr.Rourk inthe past.  Intravenous hydration, IV Protonix and IV calcium supplementation ordered by me Final Clinical Impressions(s) / ED Diagnoses  Diagnoses #1 syncope #2 rectal bleeding #3 hematemesis by history #4 epigastric abdominal pain #5 anemia #6 hypokalemia Final diagnoses:  None   #7 hyperglycemia ED Discharge Orders    None       Orlie Dakin, MD 08/19/17 Ilsa Iha    Orlie Dakin, MD 08/19/17 2009

## 2017-08-19 NOTE — Plan of Care (Signed)
  Clinical Measurements: Ability to maintain clinical measurements within normal limits will improve 08/19/2017 2133 - Progressing by Cassandria Anger, RN Diagnostic test results will improve 08/19/2017 2133 - Progressing by Cassandria Anger, RN   Activity: Risk for activity intolerance will decrease 08/19/2017 2133 - Progressing by Cassandria Anger, RN   Nutrition: Adequate nutrition will be maintained 08/19/2017 2133 - Progressing by Cassandria Anger, RN   Coping: Level of anxiety will decrease 08/19/2017 2133 - Progressing by Cassandria Anger, RN   Pain Managment: General experience of comfort will improve 08/19/2017 2133 - Progressing by Cassandria Anger, RN

## 2017-08-19 NOTE — ED Triage Notes (Addendum)
Pt c/o multiple syncopal episodes over the last few days. Pt states that she is vomiting blood and also has hematuria and bright red blood in her stools. States she recently had to have a blood transfusion. Pt had a syncopal episode in car en route to [facility]. Endorses abdominal pain and HA.

## 2017-08-20 ENCOUNTER — Inpatient Hospital Stay (HOSPITAL_COMMUNITY): Payer: Medicare PPO

## 2017-08-20 ENCOUNTER — Other Ambulatory Visit: Payer: Self-pay

## 2017-08-20 ENCOUNTER — Encounter (HOSPITAL_COMMUNITY): Payer: Self-pay | Admitting: Gastroenterology

## 2017-08-20 DIAGNOSIS — N390 Urinary tract infection, site not specified: Secondary | ICD-10-CM

## 2017-08-20 DIAGNOSIS — I340 Nonrheumatic mitral (valve) insufficiency: Secondary | ICD-10-CM

## 2017-08-20 DIAGNOSIS — K920 Hematemesis: Secondary | ICD-10-CM

## 2017-08-20 LAB — COMPREHENSIVE METABOLIC PANEL
ALBUMIN: 3.3 g/dL — AB (ref 3.5–5.0)
ALK PHOS: 84 U/L (ref 38–126)
ALT: 25 U/L (ref 14–54)
ANION GAP: 12 (ref 5–15)
AST: 27 U/L (ref 15–41)
BILIRUBIN TOTAL: 0.4 mg/dL (ref 0.3–1.2)
BUN: 8 mg/dL (ref 6–20)
CALCIUM: 8.9 mg/dL (ref 8.9–10.3)
CO2: 21 mmol/L — ABNORMAL LOW (ref 22–32)
Chloride: 106 mmol/L (ref 101–111)
Creatinine, Ser: 0.75 mg/dL (ref 0.44–1.00)
GFR calc Af Amer: >60 mL/min (ref >60)
GLUCOSE: 189 mg/dL — AB (ref 65–99)
POTASSIUM: 3.4 mmol/L — AB (ref 3.5–5.1)
Sodium: 139 mmol/L (ref 135–145)
Total Protein: 7.6 g/dL (ref 6.5–8.1)

## 2017-08-20 LAB — GLUCOSE, CAPILLARY
GLUCOSE-CAPILLARY: 147 mg/dL — AB (ref 65–99)
GLUCOSE-CAPILLARY: 182 mg/dL — AB (ref 65–99)
Glucose-Capillary: 163 mg/dL — ABNORMAL HIGH (ref 65–99)
Glucose-Capillary: 165 mg/dL — ABNORMAL HIGH (ref 65–99)
Glucose-Capillary: 229 mg/dL — ABNORMAL HIGH (ref 65–99)

## 2017-08-20 LAB — LIPID PANEL
CHOLESTEROL: 98 mg/dL (ref 0–200)
HDL: 34 mg/dL — AB (ref >40)
LDL Cholesterol: 42 mg/dL (ref 0–99)
TRIGLYCERIDES: 109 mg/dL (ref <150)
Total CHOL/HDL Ratio: 2.9 RATIO
VLDL: 22 mg/dL (ref 0–40)

## 2017-08-20 LAB — TSH: TSH: 1.952 u[IU]/mL (ref 0.350–4.500)

## 2017-08-20 LAB — CBC
HCT: 30 % — ABNORMAL LOW (ref 36.0–46.0)
HEMOGLOBIN: 9.1 g/dL — AB (ref 12.0–15.0)
MCH: 27.4 pg (ref 26.0–34.0)
MCHC: 30.3 g/dL (ref 30.0–36.0)
MCV: 90.4 fL (ref 78.0–100.0)
Platelets: 460 10*3/uL — ABNORMAL HIGH (ref 150–400)
RBC: 3.32 MIL/uL — ABNORMAL LOW (ref 3.87–5.11)
RDW: 15.1 % (ref 11.5–15.5)
WBC: 16.9 10*3/uL — AB (ref 4.0–10.5)

## 2017-08-20 LAB — TROPONIN I
Troponin I: <0.03 ng/mL (ref <0.03)
Troponin I: <0.03 ng/mL (ref <0.03)
Troponin I: <0.03 ng/mL (ref <0.03)

## 2017-08-20 LAB — RAPID URINE DRUG SCREEN, HOSP PERFORMED
Amphetamines: NOT DETECTED
BARBITURATES: NOT DETECTED
Benzodiazepines: POSITIVE — AB
Cocaine: NOT DETECTED
Opiates: NOT DETECTED
TETRAHYDROCANNABINOL: NOT DETECTED

## 2017-08-20 LAB — ECHOCARDIOGRAM COMPLETE
Height: 66 in
WEIGHTICAEL: 2896 [oz_av]

## 2017-08-20 MED ORDER — POTASSIUM CHLORIDE CRYS ER 20 MEQ PO TBCR
50.0000 meq | EXTENDED_RELEASE_TABLET | Freq: Once | ORAL | Status: AC
  Administered 2017-08-20: 50 meq via ORAL
  Filled 2017-08-20: qty 1

## 2017-08-20 MED ORDER — ONDANSETRON HCL 4 MG/2ML IJ SOLN: 4.0000 mg | Freq: Four times a day (QID) | INTRAMUSCULAR | Status: DC | PRN

## 2017-08-20 MED ORDER — PROMETHAZINE HCL 25 MG/ML IJ SOLN
25.0000 mg | Freq: Four times a day (QID) | INTRAMUSCULAR | Status: DC | PRN
  Administered 2017-08-20: 25 mg via INTRAVENOUS
  Filled 2017-08-20: qty 1

## 2017-08-20 MED ORDER — LIDOCAINE VISCOUS 2 % MT SOLN: 10.0000 mL | OROMUCOSAL | Status: DC | PRN

## 2017-08-20 MED ORDER — PANTOPRAZOLE SODIUM 40 MG IV SOLR
40.0000 mg | Freq: Two times a day (BID) | INTRAVENOUS | Status: DC
  Administered 2017-08-20 – 2017-08-21 (×2): 40 mg via INTRAVENOUS
  Filled 2017-08-20 (×2): qty 40

## 2017-08-20 MED ORDER — HYDROCORTISONE ACETATE 25 MG RE SUPP
25.0000 mg | Freq: Three times a day (TID) | RECTAL | Status: DC
  Administered 2017-08-20 – 2017-08-21 (×4): 25 mg via RECTAL
  Filled 2017-08-20 (×4): qty 1

## 2017-08-20 MED ORDER — FUROSEMIDE 10 MG/ML IJ SOLN
60.0000 mg | Freq: Once | INTRAMUSCULAR | Status: AC
  Administered 2017-08-20: 60 mg via INTRAVENOUS
  Filled 2017-08-20: qty 6

## 2017-08-20 MED ORDER — TRAZODONE HCL 50 MG PO TABS
100.0000 mg | ORAL_TABLET | Freq: Every day | ORAL | Status: DC
  Administered 2017-08-20: 100 mg via ORAL
  Filled 2017-08-20: qty 2

## 2017-08-20 MED ORDER — ROSUVASTATIN CALCIUM 10 MG PO TABS
10.0000 mg | ORAL_TABLET | Freq: Every day | ORAL | Status: DC
  Administered 2017-08-20 – 2017-08-21 (×2): 10 mg via ORAL
  Filled 2017-08-20 (×2): qty 1

## 2017-08-20 MED ORDER — DOXYCYCLINE HYCLATE 100 MG PO TABS
100.0000 mg | ORAL_TABLET | Freq: Two times a day (BID) | ORAL | Status: DC
  Administered 2017-08-20: 100 mg via ORAL
  Filled 2017-08-20: qty 1

## 2017-08-20 MED ORDER — PROMETHAZINE HCL 25 MG/ML IJ SOLN
12.5000 mg | Freq: Four times a day (QID) | INTRAMUSCULAR | Status: DC | PRN
Start: 2017-08-20 — End: 2017-08-21

## 2017-08-20 MED ORDER — BENZONATATE 100 MG PO CAPS
200.0000 mg | ORAL_CAPSULE | Freq: Three times a day (TID) | ORAL | Status: DC | PRN
  Administered 2017-08-20: 200 mg via ORAL
  Filled 2017-08-20: qty 2

## 2017-08-20 NOTE — Progress Notes (Signed)
*  PRELIMINARY RESULTS* Echocardiogram 2D Echocardiogram has been performed.  Leavy Cella 08/20/2017, 2:21 PM

## 2017-08-20 NOTE — Progress Notes (Signed)
PROGRESS NOTE    Holly Figueroa  IOM:[id number]  DOB: 09/27/1965  DOA: 08/19/2017 PCP: Keane Police, MD   Brief Admission Hx: Holly Figueroa  is a 52 y.o. female, w DM2, mitral stenosis (mild - mod) CHF (EF 55-60%), apparently c/o syncope on Sunday,  Pt was sitting on ground when this occurred,  No presyncopal symptoms.  Pt denies seizure activity.  Pt states uncertain length of duration.  Pt presented today due to hematemesis x1.  Pt also notes some rectal bleeding when wipes with toilet paper there is blood on toilet paper.  Pt denies fever, chills, abd pain, diarrhea, black stool.   Pt presented due to hematamesis.   MDM/Assessment & Plan:   1. Multifocal pneumonia-seen on chest x-ray this morning.  She also has a leukocytosis.  I have added doxycycline to her regimen.  She had already been on ceftriaxone for a UTI.  Follow CBC.  Continue supportive therapy.  There was also concern for pulmonary edema on her chest x-ray.  For this reason she was also given an IV furosemide dose of 60 mg.  Plan to repeat chest x-ray in the morning.  Echocardiogram is pending. 2. Hematemesis with concern for GI bleeding-GI has been consulted.  Her hemoglobin has remained relatively stable.  She is hemodynamically stable.  Await GI recommendations.  She is on IV Protonix. 3. Anemia, unspecified-she has an outpatient follow-up with hematology oncology arranged.  Apparently her appointment was yesterday.  She will need to have that rescheduled. 4. Syncope-she is in the process of being worked up for this with a carotid ultrasound, echocardiogram and serial troponins and continuous telemetry monitoring. 5. UTI-awaiting urine culture and treating with IV ceftriaxone daily. 6. Hypokalemia-is currently being repleted and will follow with BMP and magnesium in the morning.  DVT prophylaxis: SCDs Code Status: Full Family Communication: The patient updated at bedside Disposition Plan: Home in 1-2  days.   Consultants:  GI  Procedures:  TBD  Subjective: Patient reports that she has a lot of coughing and generalized malaise.  Objective: Vitals:   08/19/17 1814 08/19/17 2012 08/19/17 2120 08/20/17 0437  BP:  (!) 118/54 112/66 121/64  Pulse: 95 94 99 100  Resp: 19 (!) 23    Temp:   97.7 F (36.5 C) 98.1 F (36.7 C)  TempSrc:   Oral Oral  SpO2: 97% 95% 94% 90%  Weight:      Height:        Intake/Output Summary (Last 24 hours) at 08/20/2017 1147 Last data filed at 08/20/2017 0800 Gross per 24 hour  Intake 900 ml  Output -  Net 900 ml   Filed Weights   08/19/17 1346  Weight: 82.1 kg (181 lb)     REVIEW OF SYSTEMS  As per history otherwise all reviewed and reported negative  Exam:  General exam: Awake, alert, no apparent distress, cooperative. Respiratory system: Crackles heard posteriorly. No increased work of breathing. Cardiovascular system: S1 & S2 heard, RRR. No JVD, murmurs, gallops, clicks or pedal edema. Gastrointestinal system: Abdomen is nondistended, soft and nontender. Normal bowel sounds heard. Central nervous system: Alert and oriented. No focal neurological deficits. Extremities: no CCE.  Data Reviewed: Basic Metabolic Panel: Recent Labs  Lab 08/19/17 1355 08/20/17 0524  NA 140 139  K 3.3* 3.4*  CL 106 106  CO2 21* 21*  GLUCOSE 197* 189*  BUN 8 8  CREATININE 0.80 0.75  CALCIUM 9.5 8.9   Liver Function Tests: Recent Labs  Lab  08/19/17 1355 08/20/17 0524  AST 31 27  ALT 28 25  ALKPHOS 93 84  BILITOT 0.3 0.4  PROT 8.2* 7.6  ALBUMIN 3.7 3.3*   No results for input(s): LIPASE, AMYLASE in the last 168 hours. No results for input(s): AMMONIA in the last 168 hours. CBC: Recent Labs  Lab 08/19/17 1355 08/20/17 0524  WBC 11.3* 16.9*  HGB 8.9* 9.1*  HCT 29.8* 30.0*  MCV 90.9 90.4  PLT 453* 460*   Cardiac Enzymes: Recent Labs  Lab 08/19/17 2313 08/20/17 0524 08/20/17 1054  TROPONINI <0.03 <0.03 <0.03   CBG (last 3)   Recent Labs    08/20/17 0406 08/20/17 0723 08/20/17 1113  GLUCAP 163* 165* 147*   No results found for this or any previous visit (from the past 240 hour(s)).   Studies: Dg Chest Port 1 View  Result Date: 08/20/2017 CLINICAL DATA:  DOE EXAM: PORTABLE CHEST 1 VIEW COMPARISON:  08/19/2017 FINDINGS: Patient rotated rightward. There is new perihilar airspace disease. No pneumothorax. No osseous abnormality. IMPRESSION: Bilateral airspace disease suggests pulmonary edema versus multifocal pneumonia. These results will be called to the ordering clinician or representative by the Radiologist Assistant, and communication documented in the PACS or zVision Dashboard. Electronically Signed   By: Suzy Bouchard M.D.   On: 08/20/2017 09:42   Dg Chest Port 1 View  Result Date: 08/19/2017 CLINICAL DATA:  Cough, syncope EXAM: PORTABLE CHEST 1 VIEW COMPARISON:  07/11/2017 FINDINGS: Cardiomegaly. Peribronchial thickening. Interstitial prominence in the lungs could reflect interstitial edema or bronchitic changes. No effusions or acute bony abnormality. IMPRESSION: Cardiomegaly. Peribronchial thickening compatible with bronchitic changes. Interstitial prominence could be related to bronchitis or mild interstitial edema. Electronically Signed   By: Rolm Baptise M.D.   On: 08/19/2017 19:22     Scheduled Meds: . ALPRAZolam  0.5 mg Oral TID  . doxycycline  100 mg Oral Q12H  . FLUoxetine  80 mg Oral Daily  . insulin aspart  0-9 Units Subcutaneous Q4H  . pantoprazole (PROTONIX) IV  40 mg Intravenous Q12H   Continuous Infusions: . cefTRIAXone (ROCEPHIN)  IV Stopped (08/20/17 0224)  . pantoprozole (PROTONIX) infusion 8 mg/hr (08/20/17 0700)    Principal Problem:   Hematemesis Active Problems:   Hypokalemia   UTI (urinary tract infection)   Anemia   Syncope   Time spent:   Irwin Brakeman, MD, FAAFP [facility] Pager [phone]  If 7PM-7AM, please contact  night-coverage [email or web address] Password [facility] 08/20/2017, 11:47 AM    LOS: 1 day

## 2017-08-20 NOTE — Consult Note (Signed)
Referring Provider: Dr. Maudie Mercury  Primary Care Physician:  Keane Police, MD Primary Gastroenterologist:  Dr. Oneida Alar   Date of Admission: 08/19/17 Date of Consultation: 08/20/17  Reason for Consultation:  Hematemesis   HPI:  Holly Figueroa is a 52 y.o. year old female who originally was seen by [facility] as inpatient Dec 2018 with symptomatic anemia, Hgb 7.9, with normal Hgb in Aug 2018. Ferritin in Dec 2018 was 63, iron low at 25.  She underwent both colonoscopy and EGD during hospitalization. Both without obvious source for profound anemia. She received 2 units PRBCs while inpatient. Capsule then completed as outpatient but incomplete as it did not reach the cecum. She was to have a KUB to ensure capsule had passed. If so, capsule was to then be placed endoscopically via EGD so that the capsule could be deployed directly into small bowel (remained in stomach for several hours during capsule study). Hematology outpatient referral also recommended, and her appt was actually for 2/25 (yesterday).   She presented to the ED yesterday evening with reports of syncope over past 2 days, generalized weakness, hematemesis, blood in stools, hematuria. Hgb 8.9. At discharge one month ago, Hgb was 9.6. This morning, up to 9.1.   States syncopal episode started Sunday. She is drowsy this morning, stating she did not sleep well and was given phenergan. States she has had occasional vomiting at home but not often. Hematemesis yesterday "large amount" after multiple episodes of vomiting. None further. No melena. States her metformin was increased from 500 BID to 1000 BID on Friday, and then the blackouts started to occur. Takes Xaxan 1 mg TID. Has chronic headaches, daily. History of NSAIDs but none since she was hospitalized in Dec 2018. She has been taking Protonix at home.    [place] Jan 2019: incomplete capsule study as it did not reach cecum. No obvious source for anemia. Hematology referral recommended.  Was to have KUB to confirm capsule had passed. If so, would then need EGD with Propofol to deploy capsule into small bowel as prior exam with capsule in stomach for several hours.   EGD Dec 2018: esophagitis, possibly chemical or pill-induced, s/p dilation. Small hiatal hernia. Normal duodenal bulb and D2. No explanation for anemia. Colonoscopy with normal colon, normal TI. No blood. Grade I internal hemorrhoids.   Past Medical History:  Diagnosis Date  . Acute respiratory failure ([place])   . Anemia 08-2008   Blood transfusion  . Anxiety   . Benzodiazepine dependence ([place])   . Cervical cancer ([place])   . CHF (congestive heart failure) ([place])   . Depression   . Diabetes mellitus without complication ([place])   . Headache(784.0)    migraines  . Hypokalemia   . Legionella pneumonia ([place])   . Leukocytosis, unspecified   . Mitral stenosis   . Panic attacks   . Tobacco abuse     Past Surgical History:  Procedure Laterality Date  . BIOPSY  06/12/2017   Procedure: BIOPSY;  Surgeon: Daneil Dolin, MD;  Location: [facility];  Service: Gastroenterology;;  esophagus  . cardiac valve repair  2011   stretched mitral valve  . CERVICAL CONIZATION W/BX  2000  . CHOLECYSTECTOMY  06/26/2012   Procedure: LAPAROSCOPIC CHOLECYSTECTOMY WITH INTRAOPERATIVE CHOLANGIOGRAM;  Surgeon: Ralene Ok, MD;  Location: [facility];  Service: General;  Laterality: N/A;  . COLONOSCOPY WITH PROPOFOL N/A 06/12/2017   Normal colon, normal TI, Grade I internal hemorrhoids  . ESOPHAGOGASTRODUODENOSCOPY (EGD) WITH  PROPOFOL N/A 06/12/2017   esophagitis, possibly chemical or pill-induced, s/p dilation. small hiatal hernia, normal duodenal bulb and D 2  . GIVENS CAPSULE STUDY N/A 07/19/2017   incomplete capsule study as did not reach cecum.   Marland Kitchen LASER ABLATION OF THE CERVIX    . MALONEY DILATION  06/12/2017   Procedure: MALONEY DILATION;  Surgeon: Daneil Dolin, MD;  Location: [facility];  Service: Gastroenterology;;  .  MOLE REMOVAL    . NASAL SINUS SURGERY      Prior to Admission medications   Medication Sig Start Date End Date Taking? Authorizing Provider  albuterol (PROVENTIL HFA;VENTOLIN HFA) 108 (90 Base) MCG/ACT inhaler Inhale 1-2 puffs into the lungs every 6 (six) hours as needed for wheezing or shortness of breath (with Aerochamber).   Yes [provider]  ALPRAZolam Duanne Moron) 1 MG tablet Take 0.5 tablets (0.5 mg total) by mouth 3 (three) times daily. anxiety Patient taking differently: Take 1 mg by mouth 3 (three) times daily. anxiety 11/08/14  Yes Ollis, Velna Hatchet L, NP  aspirin EC 81 MG tablet Take 81 mg by mouth daily.   Yes [provider]  Cholecalciferol (VITAMIN D-3) 5000 units TABS Take 5,000 Units by mouth daily.   Yes [provider]  Cyanocobalamin (VITAMIN B-12 PO) Take 1 tablet by mouth daily.   Yes [provider]  ferrous sulfate 325 (65 FE) MG tablet Take 1 tablet (325 mg total) by mouth daily. 07/11/17  Yes Idol, Almyra Free, PA-C  FLUoxetine (PROZAC) 40 MG capsule Take 80 mg by mouth daily.    Yes [provider]  furosemide (LASIX) 20 MG tablet Take 20 mg by mouth daily as needed for fluid.   Yes [provider]  metFORMIN (GLUCOPHAGE) 500 MG tablet Take 1 tablet (500 mg total) by mouth 2 (two) times daily with a meal. Patient taking differently: Take 1,000 mg by mouth 2 (two) times daily with a meal.  02/08/17  Yes Pattricia Boss, MD  ondansetron (ZOFRAN) 4 MG tablet Take 4 mg by mouth every 8 (eight) hours as needed for nausea or vomiting.  08/16/17  Yes [provider]  pantoprazole (PROTONIX) 40 MG tablet Take 40 mg by mouth daily.   Yes [provider]  potassium chloride SA (K-DUR,KLOR-CON) 20 MEQ tablet Take 1 tablet by mouth daily as needed (when taken with Lasix for fluid).  12/04/10  Yes [provider]  rosuvastatin (CRESTOR) 10 MG tablet Take 10 mg by mouth daily. 04/15/17  Yes [provider]   Spacer/Aero-Holding Chambers (AEROCHAMBER PLUS FLO-VU MEDIUM) MISC 1 each by Other route every 6 (six) hours as needed.   Yes [provider]  traZODone (DESYREL) 50 MG tablet Take 100 mg by mouth at bedtime.    Yes [provider]  vitamin C (ASCORBIC ACID) 500 MG tablet Take 500 mg by mouth daily.   Yes [provider]  cefdinir (OMNICEF) 300 MG capsule Take 1 capsule (300 mg total) by mouth 2 (two) times daily. Patient not taking: Reported on 08/19/2017 06/13/17   Orson Eva, MD    Current Facility-Administered Medications  Medication Dose Route Frequency Provider Last Rate Last Dose  . acetaminophen (TYLENOL) tablet 650 mg  650 mg Oral Q6H PRN Jani Gravel, MD       Or  . acetaminophen (TYLENOL) suppository 650 mg  650 mg Rectal Q6H PRN Jani Gravel, MD      . albuterol (PROVENTIL) (2.5 MG/3ML) 0.083% nebulizer solution 3 mL  3 mL Inhalation Q6H PRN Jani Gravel, MD      . ALPRAZolam Duanne Moron) tablet 0.5 mg  0.5 mg Oral TID Jani Gravel, MD   0.5 mg at 08/19/17 2342  . benzonatate (TESSALON) capsule 200 mg  200 mg Oral TID PRN Reubin Milan, MD   200 mg at 08/20/17 0344  . cefTRIAXone (ROCEPHIN) 1 g in sodium chloride 0.9 % 100 mL IVPB  1 g Intravenous Q24H Jani Gravel, MD   Stopped at 08/20/17 6967  . doxycycline (VIBRA-TABS) tablet 100 mg  100 mg Oral Q12H Johnson, Clanford L, MD      . FLUoxetine (PROZAC) capsule 80 mg  80 mg Oral Daily Jani Gravel, MD      . insulin aspart (novoLOG) injection 0-9 Units  0-9 Units Subcutaneous Q4H Jani Gravel, MD   2 Units at 08/20/17 [phone]  . pantoprazole (PROTONIX) 80 mg in sodium chloride 0.9 % 250 mL (0.32 mg/mL) infusion  8 mg/hr Intravenous Continuous Jani Gravel, MD 25 mL/hr at 08/20/17 0700 8 mg/hr at 08/20/17 0700  . promethazine (PHENERGAN) injection 25 mg  25 mg Intravenous Q6H PRN Reubin Milan, MD   25 mg at 08/20/17 0346    Allergies as of 08/19/2017 - Review Complete 08/19/2017  Allergen Reaction Noted  .  Iodinated diagnostic agents Anaphylaxis 10/11/2010  . Aspirin Other (See Comments) 07/11/2017  . Ibuprofen Other (See Comments) 07/11/2017  . Sulfa antibiotics Hives 10/11/2010  . Dilaudid [hydromorphone hcl] Itching and Palpitations 10/11/2010  . Hydrocodone Palpitations 10/28/2014    Family History  Problem Relation Age of Onset  . Heart disease Father   . COPD Father   . Heart failure Maternal Grandmother   . Stroke Mother   . Colon cancer Brother 35       deceased   . Colon polyps Neg Hx     Social History   Socioeconomic History  . Marital status: Married    Spouse name: Not on file  . Number of children: Not on file  . Years of education: Not on file  . Highest education level: Not on file  Social Needs  . Financial resource strain: Not on file  . Food insecurity - worry: Not on file  . Food insecurity - inability: Not on file  . Transportation needs - medical: Not on file  . Transportation needs - non-medical: Not on file  Occupational History  . Not on file  Tobacco Use  . Smoking status: Current Every Day Smoker    Packs/day: 1.00    Years: 23.00    Pack years: 23.00    Types: Cigarettes    Last attempt to quit: 05/06/2017    Years since quitting: 0.2  . Smokeless tobacco: Never Used  Substance and Sexual Activity  . Alcohol use: No    Alcohol/week: 0.0 oz  . Drug use: No  . Sexual activity: Yes  Other Topics Concern  . Not on file  Social History Narrative  . Not on file    Review of Systems: Gen: Denies fever, chills, loss of appetite, change in weight or weight loss CV: Denies chest pain, heart palpitations, syncope, edema  Resp: _coughing  GI: see HPI  GU : Denies urinary burning, urinary frequency, urinary incontinence.  MS: Denies joint pain,swelling, cramping Derm: Denies rash, itching, dry skin Psych: Denies depression, anxiety,confusion, or memory loss Heme: see HPI   Physical Exam: Vital signs in last 24 hours: Temp:  [97.7 F  (36.5 C)-98.2 F (  36.8 C)] 98.1 F (36.7 C) (02/26 0437) Pulse Rate:  [94-100] 100 (02/26 0437) Resp:  [16-23] 23 (02/25 2012) BP: (109-127)/(54-70) 121/64 (02/26 0437) SpO2:  [90 %-97 %] 90 % (02/26 0437) Weight:  [181 lb (82.1 kg)] 181 lb (82.1 kg) (02/25 1346) Last BM Date: 08/19/17 General:   Drowsy but easily awakens,  Well-developed, well-nourished, pleasant and cooperative in NAD Head:  Normocephalic and atraumatic. Eyes:  Sclera clear, no icterus.   Conjunctiva pink. Ears:  Normal auditory acuity. Nose:  No deformity, discharge,  or lesions. Mouth:  No deformity or lesions, dentition normal. Lungs:  No wheezing, diminished bases  Heart:  S1 S2 present, soft systolic murmur  Abdomen:  Soft, nontender and nondistended. No masses, hepatosplenomegaly or hernias noted. Normal bowel sounds, without guarding, and without rebound.   Rectal:  Deferred until time of colonoscopy.   Msk:  Symmetrical without gross deformities. Normal posture. Extremities:  Without  edema. Neurologic:  Alert and  oriented x4 Psych:  Alert and cooperative. Normal mood and affect.  Intake/Output from previous day: 02/25 0701 - 02/26 0700 In: 900 [I.V.:700; IV Piggyback:200] Out: -  Intake/Output this shift: No intake/output data recorded.  Lab Results: Recent Labs    08/19/17 1355 08/20/17 0524  WBC 11.3* 16.9*  HGB 8.9* 9.1*  HCT 29.8* 30.0*  PLT 453* 460*   BMET Recent Labs    08/19/17 1355 08/20/17 0524  NA 140 139  K 3.3* 3.4*  CL 106 106  CO2 21* 21*  GLUCOSE 197* 189*  BUN 8 8  CREATININE 0.80 0.75  CALCIUM 9.5 8.9   LFT Recent Labs    08/19/17 1355 08/20/17 0524  PROT 8.2* 7.6  ALBUMIN 3.7 3.3*  AST 31 27  ALT 28 25  ALKPHOS 93 84  BILITOT 0.3 0.4    Studies/Results: Dg Chest Port 1 View  Result Date: 08/19/2017 CLINICAL DATA:  Cough, syncope EXAM: PORTABLE CHEST 1 VIEW COMPARISON:  07/11/2017 FINDINGS: Cardiomegaly. Peribronchial thickening. Interstitial  prominence in the lungs could reflect interstitial edema or bronchitic changes. No effusions or acute bony abnormality. IMPRESSION: Cardiomegaly. Peribronchial thickening compatible with bronchitic changes. Interstitial prominence could be related to bronchitis or mild interstitial edema. Electronically Signed   By: Rolm Baptise M.D.   On: 08/19/2017 19:22    Impression: 52 year old female admitted with profound symptomatic anemia in Dec 2018 s/p 2 units PRBCs, with improvement in Hgb to in the 10 range by discharge in Dec 2018. Colonoscopy and EGD without obvious source for significant anemia, and capsule study was attempted as outpatient but incomplete as it did not reach the cecum. However, what was seen was unrevealing. Hematology referral recommended as outpatient, and she was actually due for initial consultation on 08/19/17 but presented to the ED with multiple complaints as noted above.   Rectal bleeding: likely secondary to known internal hemorrhoids.  Hematemesis: known history of esophagitis seen on recent EGD. Can stop PPI infusion and change to PPI IV BID.   N/V: history of diabetes. Unable to exclude delayed gastric emptying  Anemia: mixed pattern. Unclear if syncopal episodes are related to anemia and agree with further evaluation as planned (troponin, carotid ultrasound, echo). Prior evaluation with ferritin in the 60s. Hgb on presentation for this admission was 8.9, drifted down from 2 months ago (10.7>9.6>8.9 on admission). However, she does not appear to have a significant GI bleed and is actually improved today. Capsule study is indicated to conclude GI evaluation. Will need KUB to ensure  capsule has passed and if so, will need EGD with Propofol to deploy capsule into small bowel; however, this could wait until outpatient due to multiple other health issues.  Syncopal episodes: unable to entirely say this is related to anemia, as her Hgb is fairly similar to prior range. Her husband  noted that her metformin was increased on Friday, just prior to onset of syncopal episodes, and they also raise concern regarding chronic headaches. Requesting MRI. Will defer to hospitalist.   Multi-focal pneumonia: with leukocytosis. Per hospitalist. Would defer any EGD with capsule deployment till recovered or even as outpatient in a more elective setting.    Plan: KUB today Stop Protonix infusion: change to PPI IV BID Will need capsule study at some point in the near future Will need outpatient Hematology follow-up May have diet advanced after KUB Zofran as needed for nausea   Annitta Needs, PhD, ANP-BC [facility]       LOS: 1 day    08/20/2017, 8:15 AM

## 2017-08-20 NOTE — Progress Notes (Signed)
Received call from [facility] radiology regarding CXR results. Text paged Dr Wynetta Emery to notify. Donavan Foil, RN

## 2017-08-20 NOTE — Progress Notes (Signed)
Patient vomited moderate amount mucous/ liquid based emesis after coughing spell. Presently feels nauseas. MD made aware

## 2017-08-21 ENCOUNTER — Telehealth: Payer: Self-pay | Admitting: Gastroenterology

## 2017-08-21 ENCOUNTER — Inpatient Hospital Stay (HOSPITAL_COMMUNITY): Payer: Medicare PPO

## 2017-08-21 DIAGNOSIS — K625 Hemorrhage of anus and rectum: Secondary | ICD-10-CM

## 2017-08-21 DIAGNOSIS — D649 Anemia, unspecified: Secondary | ICD-10-CM

## 2017-08-21 LAB — CBC WITH DIFFERENTIAL/PLATELET
Basophils Absolute: 0 10*3/uL (ref 0.0–0.1)
Basophils Relative: 0 %
Eosinophils Absolute: 0.3 10*3/uL (ref 0.0–0.7)
Eosinophils Relative: 4 %
HCT: 29.1 % — ABNORMAL LOW (ref 36.0–46.0)
Hemoglobin: 8.9 g/dL — ABNORMAL LOW (ref 12.0–15.0)
LYMPHS ABS: 1.9 10*3/uL (ref 0.7–4.0)
LYMPHS PCT: 22 %
MCH: 27.6 pg (ref 26.0–34.0)
MCHC: 30.6 g/dL (ref 30.0–36.0)
MCV: 90.1 fL (ref 78.0–100.0)
MONO ABS: 0.5 10*3/uL (ref 0.1–1.0)
Monocytes Relative: 5 %
Neutro Abs: 6.1 10*3/uL (ref 1.7–7.7)
Neutrophils Relative %: 69 %
Platelets: 421 10*3/uL — ABNORMAL HIGH (ref 150–400)
RBC: 3.23 MIL/uL — ABNORMAL LOW (ref 3.87–5.11)
RDW: 15.1 % (ref 11.5–15.5)
WBC: 8.9 10*3/uL (ref 4.0–10.5)

## 2017-08-21 LAB — COMPREHENSIVE METABOLIC PANEL
ALT: 21 U/L (ref 14–54)
AST: 21 U/L (ref 15–41)
Albumin: 3.2 g/dL — ABNORMAL LOW (ref 3.5–5.0)
Alkaline Phosphatase: 78 U/L (ref 38–126)
Anion gap: 11 (ref 5–15)
BUN: 9 mg/dL (ref 6–20)
CHLORIDE: 103 mmol/L (ref 101–111)
CO2: 25 mmol/L (ref 22–32)
Calcium: 8.7 mg/dL — ABNORMAL LOW (ref 8.9–10.3)
Creatinine, Ser: 0.74 mg/dL (ref 0.44–1.00)
Glucose, Bld: 181 mg/dL — ABNORMAL HIGH (ref 65–99)
POTASSIUM: 3.8 mmol/L (ref 3.5–5.1)
Sodium: 139 mmol/L (ref 135–145)
Total Bilirubin: 0.4 mg/dL (ref 0.3–1.2)
Total Protein: 7.5 g/dL (ref 6.5–8.1)

## 2017-08-21 LAB — GLUCOSE, CAPILLARY
GLUCOSE-CAPILLARY: 147 mg/dL — AB (ref 65–99)
GLUCOSE-CAPILLARY: 171 mg/dL — AB (ref 65–99)
Glucose-Capillary: 151 mg/dL — ABNORMAL HIGH (ref 65–99)

## 2017-08-21 LAB — MAGNESIUM: Magnesium: 1.5 mg/dL — ABNORMAL LOW (ref 1.7–2.4)

## 2017-08-21 MED ORDER — HYDROCORTISONE ACETATE 25 MG RE SUPP: 25.0000 mg | Freq: Three times a day (TID) | RECTAL | 1 refills | Status: DC

## 2017-08-21 MED ORDER — MAGNESIUM SULFATE 2 GM/50ML IV SOLN
2.0000 g | Freq: Once | INTRAVENOUS | Status: AC
  Administered 2017-08-21: 2 g via INTRAVENOUS
  Filled 2017-08-21: qty 50

## 2017-08-21 MED ORDER — PANTOPRAZOLE SODIUM 40 MG PO TBEC: 40.0000 mg | DELAYED_RELEASE_TABLET | Freq: Two times a day (BID) | ORAL | Status: DC

## 2017-08-21 MED ORDER — AMOXICILLIN-POT CLAVULANATE 875-125 MG PO TABS: 1.0000 | ORAL_TABLET | Freq: Two times a day (BID) | ORAL | 0 refills | Status: DC

## 2017-08-21 MED ORDER — PROMETHAZINE HCL 12.5 MG PO TABS: 12.5000 mg | ORAL_TABLET | Freq: Four times a day (QID) | ORAL | 0 refills | Status: DC | PRN

## 2017-08-21 NOTE — Progress Notes (Signed)
Subjective:  Worried about her rectal bleeding. Intermittent for a year. "it cannot be just hemorrhoids". Complains of fatigue and dark urine. Wonders why she was passing out prior to admission. No further hematemesis. Postmenopausal and no vaginal bleeding in a long time.   Objective: Vital signs in last 24 hours: Temp:  [97.5 F (36.4 C)-98.6 F (37 C)] 98.6 F (37 C) (02/27 0449) Pulse Rate:  [85-97] 85 (02/27 0449) Resp:  [20] 20 (02/26 1251) BP: (104-122)/(53-60) 104/60 (02/27 0449) SpO2:  [93 %-95 %] 93 % (02/27 0449) Last BM Date: 08/20/17 General:   Alert,  Well-developed, well-nourished, pleasant and cooperative in NAD Head:  Normocephalic and atraumatic. Eyes:  Sclera clear, no icterus.   Abdomen:  Soft, nontender and nondistended.  Normal bowel sounds, without guarding, and without rebound.   Extremities:  Without clubbing, deformity or edema. Neurologic:  Alert and  oriented x4;  grossly normal neurologically. Skin:  Intact without significant lesions or rashes. Psych:  Alert and cooperative. Normal mood and affect.  Intake/Output from previous day: 02/26 0701 - 02/27 0700 In: 240 [P.O.:240] Out: 295 [Urine:294; Stool:1] Intake/Output this shift: Total I/O In: 240 [P.O.:240] Out: -   Lab Results: CBC Recent Labs    08/19/17 1355 08/20/17 0524 08/21/17 0430  WBC 11.3* 16.9* 8.9  HGB 8.9* 9.1* 8.9*  HCT 29.8* 30.0* 29.1*  MCV 90.9 90.4 90.1  PLT 453* 460* 421*   BMET Recent Labs    08/19/17 1355 08/20/17 0524 08/21/17 0430  NA 140 139 139  K 3.3* 3.4* 3.8  CL 106 106 103  CO2 21* 21* 25  GLUCOSE 197* 189* 181*  BUN _0 CREATININE 0.80 0.75 0.74  CALCIUM 9.5 8.9 8.7*   LFTs Recent Labs    08/19/17 1355 08/20/17 0524 08/21/17 0430  BILITOT 0.3 0.4 0.4  ALKPHOS 93 84 78  AST _1 ALT _2 PROT 8.2* 7.6 7.5  ALBUMIN 3.7 3.3* 3.2*   No results for input(s): LIPASE in the last 72 hours. PT/INR No results for input(s):  LABPROT, INR in the last 72 hours.    Imaging Studies: Dg Abd 1 View  Result Date: 08/20/2017 CLINICAL DATA:  Capsule study EXAM: ABDOMEN - 1 VIEW COMPARISON:  None. FINDINGS: Surgical clips in the right upper quadrant. Nonobstructed gas pattern. Calcified pelvic phleboliths. No definite metallic radiopaque foreign body is seen. IMPRESSION: 1. No definite radiopaque metallic foreign body seen 2. Nonobstructed gas pattern. Electronically Signed   By: Donavan Foil M.D.   On: 08/20/2017 16:50   US Carotid Bilateral  Result Date: 08/20/2017 CLINICAL DATA:  52 year old female with a history of syncope EXAM: BILATERAL CAROTID DUPLEX ULTRASOUND TECHNIQUE: Pearline Cables scale imaging, color Doppler and duplex ultrasound were performed of bilateral carotid and vertebral arteries in the neck. COMPARISON:  No prior duplex FINDINGS: Criteria: Quantification of carotid stenosis is based on velocity parameters that correlate the residual internal carotid diameter with NASCET-based stenosis levels, using the diameter of the distal internal carotid lumen as the denominator for stenosis measurement. The following velocity measurements were obtained: RIGHT ICA:  Systolic 660 cm/sec, Diastolic 30 cm/sec CCA:  85 cm/sec SYSTOLIC ICA/CCA RATIO:  1.2 ECA:  105 cm/sec LEFT ICA:  Systolic 79 cm/sec, Diastolic 33 cm/sec CCA:  83 cm/sec SYSTOLIC ICA/CCA RATIO:  1.0 ECA:  85 cm/sec Right Brachial SBP: Not acquired Left Brachial SBP: Not acquired RIGHT CAROTID ARTERY: No significant calcified disease of the right common carotid artery.  Intermediate waveform maintained. Heterogeneous plaque without significant calcifications at the right carotid bifurcation. Low resistance waveform of the right ICA. No significant tortuosity. RIGHT VERTEBRAL ARTERY: Antegrade flow with low resistance waveform. LEFT CAROTID ARTERY: No significant calcified disease of the left common carotid artery. Intermediate waveform maintained. Heterogeneous plaque at the  left carotid bifurcation without significant calcifications. Low resistance waveform of the left ICA. LEFT VERTEBRAL ARTERY: Left vertebral artery not visualized. IMPRESSION: Color duplex indicates minimal heterogeneous plaque, with no hemodynamically significant stenosis by duplex criteria in the extracranial cerebrovascular circulation. No left vertebral artery not visualized. Signed, Dulcy Fanny. Earleen Newport, DO [facility] Electronically Signed   By: Corrie Mckusick D.O.   On: 08/20/2017 13:41   Dg Chest Port 1 View  Result Date: 08/21/2017 CLINICAL DATA:  Pneumonia/hx cervical cancer/diabetic/chf/smoker EXAM: PORTABLE CHEST 1 VIEW COMPARISON:  08/20/2017 FINDINGS: Heart size is accentuated by technique. There has been improvement in aeration. Persistent opacity in the MEDIAL LEFT lung base obscures the hemidiaphragm. Persistent prominent diffuse airspace filling opacities warrant follow-up. IMPRESSION: Improved aeration, favoring improvement in pulmonary edema. Electronically Signed   By: Nolon Nations M.D.   On: 08/21/2017 09:54   Dg Chest Port 1 View  Result Date: 08/20/2017 CLINICAL DATA:  DOE EXAM: PORTABLE CHEST 1 VIEW COMPARISON:  08/19/2017 FINDINGS: Patient rotated rightward. There is new perihilar airspace disease. No pneumothorax. No osseous abnormality. IMPRESSION: Bilateral airspace disease suggests pulmonary edema versus multifocal pneumonia. These results will be called to the ordering clinician or representative by the Radiologist Assistant, and communication documented in the PACS or zVision Dashboard. Electronically Signed   By: Suzy Bouchard M.D.   On: 08/20/2017 09:42   Dg Chest Port 1 View  Result Date: 08/19/2017 CLINICAL DATA:  Cough, syncope EXAM: PORTABLE CHEST 1 VIEW COMPARISON:  07/11/2017 FINDINGS: Cardiomegaly. Peribronchial thickening. Interstitial prominence in the lungs could reflect interstitial edema or bronchitic  changes. No effusions or acute bony abnormality. IMPRESSION: Cardiomegaly. Peribronchial thickening compatible with bronchitic changes. Interstitial prominence could be related to bronchitis or mild interstitial edema. Electronically Signed   By: Rolm Baptise M.D.   On: 08/19/2017 19:22  [2 weeks]   Assessment: 52 year old female admitted with profound symptomatic anemia in Dec 2018 s/p 2 units PRBCs, with improvement in Hgb to in the 10 range by discharge in Dec 2018. Colonoscopy and EGD without obvious source for significant anemia, and capsule study was attempted as outpatient but incomplete as it did not reach the cecum. However, what was seen was unrevealing. Hematology referral recommended as outpatient, and she was actually due for initial consultation on 08/19/17 but presented to the ED with multiple complaints as noted above.   Rectal bleeding: persistent but likely secondary to known internal hemorrhoids.Hgb has been stable this admission.   Hematemesis: known history of esophagitis seen on recent EGD. No further episodes. Switch over to oral PPI BID.   N/V: history of diabetes. Unable to exclude delayed gastric emptying  Anemia: mixed pattern.  Hgb on presentation for this admission was 8.9, drifted down from 2 months ago (10.7>9.6>8.9 on admission). However, she does not appear to have a significant GI bleed and Hgb stable. Capsule study is indicated to conclude GI evaluation. EGD with Propofol to deploy capsule into small bowel; however, this could wait until outpatient due to multiple other health issues.  Plan: 1. Outpatient EGD with propofol for small bowel capsule placement.  2. She will need outpatient hematology follow up as she missed  her first appt with this admission.  3. Topical hydrocortisone for hemorrhoids, consider hemorrhoidectomy if persistent bleeding.  4. If persistent n/v, consider outpatient evaluation for GES once she has recovered from acute illnesses.    Laureen Ochs. Bernarda Caffey [facility] [phone] 2/27/201910:20 AM      LOS: 2 days

## 2017-08-21 NOTE — Telephone Encounter (Signed)
Please schedule ov in 3 weeks for hospital f/u in order to schedule EGD with propofol and capsule endoscopy placement. Can use urgent.

## 2017-08-21 NOTE — Progress Notes (Signed)
Patient discharged home.  IV removed - WNL.  Reviewed AVS  - emphasized importance of taking complete dose of abx and of following up with PCP, hematoglogy and GI.  Verbalizes understanding.  No questions at this time.  Patient in NAD - assisted off unit

## 2017-08-21 NOTE — Care Management Important Message (Signed)
Important Message  Patient Details  Name: Holly Figueroa MRN: [id number] Date of Birth: Jun 16, 1966   Medicare Important Message Given:  Yes    Sherald Barge, RN 08/21/2017, 11:58 AM

## 2017-08-21 NOTE — Discharge Instructions (Signed)
Please reschedule appointment with hematologist as soon as possible for anemia work up. Please ask your primary care provider to set you up for pulmonary function testing.  Seek medical care or return if symptoms recur, worsen or new problems develop.    Follow with Primary MD  Keane Police, MD  and other consultant's as instructed your Hospitalist MD  Please get a complete blood count and chemistry panel checked by your Primary MD at your next visit, and again as instructed by your Primary MD.  Get Medicines reviewed and adjusted: Please take all your medications with you for your next visit with your Primary MD  Laboratory/radiological data: Please request your Primary MD to go over all hospital tests and procedure/radiological results at the follow up, please ask your Primary MD to get all Hospital records sent to his/her office.  In some cases, they will be blood work, cultures and biopsy results pending at the time of your discharge. Please request that your primary care M.D. follows up on these results.  Also Note the following: If you experience worsening of your admission symptoms, develop shortness of breath, life threatening emergency, suicidal or homicidal thoughts you must seek medical attention immediately by calling 911 or calling your MD immediately  if symptoms less severe.  You must read complete instructions/literature along with all the possible adverse reactions/side effects for all the Medicines you take and that have been prescribed to you. Take any new Medicines after you have completely understood and accpet all the possible adverse reactions/side effects.   Do not drive when taking Pain medications or sleeping medications (Benzodaizepines)  Do not take more than prescribed Pain, Sleep and Anxiety Medications. It is not advisable to combine anxiety,sleep and pain medications without talking with your primary care practitioner  Special Instructions: If you have  smoked or chewed Tobacco  in the last 2 yrs please stop smoking, stop any regular Alcohol  and or any Recreational drug use.  Wear Seat belts while driving.  Please note: You were cared for by a hospitalist during your hospital stay. Once you are discharged, your primary care physician will handle any further medical issues. Please note that NO REFILLS for any discharge medications will be authorized once you are discharged, as it is imperative that you return to your primary care physician (or establish a relationship with a primary care physician if you do not have one) for your post hospital discharge needs so that they can reassess your need for medications and monitor your lab values.    Community-Acquired Pneumonia, Adult Pneumonia is an infection of the lungs. One type of pneumonia can happen while a person is in a hospital. A different type can happen when a person is not in a hospital (community-acquired pneumonia). It is easy for this kind to spread from person to person. It can spread to you if you breathe near an infected person who coughs or sneezes. Some symptoms include:  A dry cough.  A wet (productive) cough.  Fever.  Sweating.  Chest pain.  Follow these instructions at home:  Take over-the-counter and prescription medicines only as told by your doctor. ? Only take cough medicine if you are losing sleep. ? If you were prescribed an antibiotic medicine, take it as told by your doctor. Do not stop taking the antibiotic even if you start to feel better.  Sleep with your head and neck raised (elevated). You can do this by putting a few pillows under your head, or you  can sleep in a recliner.  Do not use tobacco products. These include cigarettes, chewing tobacco, and e-cigarettes. If you need help quitting, ask your doctor.  Drink enough water to keep your pee (urine) clear or pale yellow. A shot (vaccine) can help prevent pneumonia. Shots are often suggested  for:  People older than 51 years of age.  People older than 51 years of age: ? Who are having cancer treatment. ? Who have long-term (chronic) lung disease. ? Who have problems with their body's defense system (immune system).  You may also prevent pneumonia if you take these actions:  Get the flu (influenza) shot every year.  Go to the dentist as often as told.  Wash your hands often. If soap and water are not available, use hand sanitizer.  Contact a doctor if:  You have a fever.  You lose sleep because your cough medicine does not help. Get help right away if:  You are short of breath and it gets worse.  You have more chest pain.  Your sickness gets worse. This is very serious if: ? You are an older adult. ? Your body's defense system is weak.  You cough up blood. This information is not intended to replace advice given to you by your health care provider. Make sure you discuss any questions you have with your health care provider. Document Released: 11/28/2007 Document Revised: 11/17/2015 Document Reviewed: 10/06/2014 Elsevier Interactive Patient Education  [employer].

## 2017-08-21 NOTE — Plan of Care (Signed)
  Education: Knowledge of General Education information will improve 08/21/2017 0227 - Progressing by Cassandria Anger, RN   Clinical Measurements: Ability to maintain clinical measurements within normal limits will improve 08/21/2017 0227 - Progressing by Cassandria Anger, RN Will remain free from infection 08/21/2017 0227 - Progressing by Cassandria Anger, RN Cardiovascular complication will be avoided 08/21/2017 0227 - Progressing by Cassandria Anger, RN   Activity: Risk for activity intolerance will decrease 08/21/2017 0227 - Progressing by Cassandria Anger, RN   Coping: Level of anxiety will decrease 08/21/2017 0227 - Progressing by Cassandria Anger, RN   Pain Managment: General experience of comfort will improve 08/21/2017 0227 - Progressing by Cassandria Anger, RN

## 2017-08-21 NOTE — Discharge Summary (Addendum)
Physician Discharge Summary  Holly Figueroa ZGY:[id number] DOB: 08-26-1965 DOA: 08/19/2017  PCP: Keane Police, MD  Admit date: 08/19/2017 Discharge date: 08/21/2017  Admitted From: HOME  Disposition: HOME  Recommendations for Outpatient Follow-up:  1. Follow up with PCP in 1 weeks.   2. Reschedule appointment with hematologist as soon as possible.  3. Follow up with GI for further outpatient work up in 2-3 weeks. 4. Please obtain BMP/CBC in one week 5. Repeat CXR in 3 weeks to ensure full resolution.  6. Please follow up on the following pending results: Final Culture Data  Home Health: PT DECLINED SERVICES  Discharge Condition: STABLE   CODE STATUS: FULL    Brief Hospitalization Summary: Please see all hospital notes, images, labs for full details of the hospitalization. Brief Admission Hx: MichelleAldersonis a52 y.o.female,w DM2, mitral stenosis (mild - mod) CHF (EF 55-60%), apparently c/o syncope on Sunday, Pt was sitting on ground when this occurred, No presyncopal symptoms. Pt denies seizure activity. Pt states uncertain length of duration. Pt presented today due to hematemesis x1. Pt also notes some rectal bleeding when wipes with toilet paper there is blood on toilet paper. Pt denies fever, chills, abd pain, diarrhea, black stool. Pt presented due to hematamesis.   MDM/Assessment & Plan:   1. Multifocal pneumonia-seen on chest x-ray this morning.  She also has a leukocytosis which is better now.  discharge on augmentin for 7 days.  Xray is looking better.   There was also concern for pulmonary edema on her chest x-ray.  For this reason she was also given an IV furosemide dose of 60 mg.  Plan to repeat chest x-ray in the morning.  Echocardiogram preserved EF and grade 1 DD . 2. Hematemesis with concern for GI bleeding-GI has been consulted.  Her hemoglobin has remained relatively stable.  she has esophagitis on EGD. She is hemodynamically stable.  Continue  protonix.  She does have hemorrhoids and she declined to see surgeon to have them evaluated for hemorrhoidectomy. 3. Anemia, unspecified-she has an outpatient follow-up with hematology oncology arranged.  Apparently her appointment was yesterday.  She will need to have that rescheduled.  Repeat CBC in 1 week with PCP and /or hematologist.  4. Syncope-she was worked up for this with a carotid ultrasound, echocardiogram and serial troponins and continuous telemetry monitoring with no significant abnormalities found to explain symptoms.  She says that she is going to a tertiary care center after discharge. She reports [facility] or [facility] or [facility].  5. UTI-awaiting urine culture and treating with IV ceftriaxone daily.  Final urine culture results pending.  Based on recent culture, she had E coli sensitive to augmentin so hopefully she is covered for this infection.  Follow up with PCP to review final culture data and to adjust antibiotics as needed.  6. Hypokalemia-repleted.   DVT prophylaxis: SCDs Code Status: Full Family Communication: The patient updated at bedside, no other family present. Disposition Plan: Home, pt declined [place] services and said that she is going directly to a tertiary care center after discharge.   Consultants:  GI  Echocardiogram Study Conclusions  - Left ventricle: The cavity size was normal. Wall thickness was  increased in a pattern of mild LVH. Systolic function was   vigorous. The estimated ejection fraction was in the range of 65%  to 70%. Wall motion was normal; there were no regional wall   motion abnormalities. Doppler parameters are consistent with  abnormal left ventricular relaxation (grade  1 diastolic  dysfunction). Doppler parameters are consistent with high  ventricular filling pressure. - Aortic valve: There was mild regurgitation. While peak velocities  and mean gradients are elevated across LVOT, there may only be a   mild degree of aortic  stenosis on gross examination of the valve. - Mitral valve: Moderately thickened leaflets . While mean gradient is elevated, mitral stenosis appears more in the mild range and   at most mild to moderate range. There was mild regurgitation. - Left atrium: The atrium was mildly dilated. - Tricuspid valve: There was mild regurgitation. - Pulmonary arteries: Inadequate TR jet to accurately assess  pulmonary pressures.  Discharge Diagnoses:  Principal Problem:   Hematemesis Active Problems:   Hypokalemia   UTI (urinary tract infection)   Anemia   Syncope   Rectal bleeding  Discharge Instructions: Discharge Instructions    Call MD for:  difficulty breathing, headache or visual disturbances   Complete by:  As directed    Call MD for:  extreme fatigue   Complete by:  As directed    Call MD for:  persistant dizziness or light-headedness   Complete by:  As directed    Call MD for:  persistant nausea and vomiting   Complete by:  As directed    Call MD for:  severe uncontrolled pain   Complete by:  As directed    Increase activity slowly   Complete by:  As directed      Allergies as of 08/21/2017      Reactions   Iodinated Diagnostic Agents Anaphylaxis   Aspirin Other (See Comments)   "Possible blood in stool" the 373m. Can take 835m   Ibuprofen Other (See Comments)   "Possible blood in stool"   Sulfa Antibiotics Hives   Zofran [ondansetron Hcl]    Bad headaches   Dilaudid [hydromorphone Hcl] Itching, Palpitations   Hydrocodone Palpitations      Medication List    STOP taking these medications   cefdinir 300 MG capsule Commonly known as:  OMNICEF     TAKE these medications   AEROCHAMBER PLUS FLO-VU MEDIUM Misc 1 each by Other route every 6 (six) hours as needed.   albuterol 108 (90 Base) MCG/ACT inhaler Commonly known as:  PROVENTIL HFA;VENTOLIN HFA Inhale 1-2 puffs into the lungs every 6 (six) hours as needed for wheezing or shortness of breath (with Aerochamber).    ALPRAZolam 1 MG tablet Commonly known as:  XANAX Take 0.5 tablets (0.5 mg total) by mouth 3 (three) times daily. anxiety What changed:    how much to take  additional instructions   amoxicillin-clavulanate 875-125 MG tablet Commonly known as:  AUGMENTIN Take 1 tablet by mouth 2 (two) times daily for 7 days.   aspirin EC 81 MG tablet Take 81 mg by mouth daily.   ferrous sulfate 325 (65 FE) MG tablet Take 1 tablet (325 mg total) by mouth daily.   FLUoxetine 40 MG capsule Commonly known as:  PROZAC Take 80 mg by mouth daily.   furosemide 20 MG tablet Commonly known as:  LASIX Take 20 mg by mouth daily as needed for fluid.   hydrocortisone 25 MG suppository Commonly known as:  ANUSOL-HC Place 1 suppository (25 mg total) rectally 4 (four) times daily -  with meals and at bedtime.   metFORMIN 500 MG tablet Commonly known as:  GLUCOPHAGE Take 1 tablet (500 mg total) by mouth 2 (two) times daily with a meal. What changed:  how much to take  ondansetron 4 MG tablet Commonly known as:  ZOFRAN Take 4 mg by mouth every 8 (eight) hours as needed for nausea or vomiting.   pantoprazole 40 MG tablet Commonly known as:  PROTONIX Take 40 mg by mouth daily.   potassium chloride SA 20 MEQ tablet Commonly known as:  K-DUR,KLOR-CON Take 1 tablet by mouth daily as needed (when taken with Lasix for fluid).   promethazine 12.5 MG tablet Commonly known as:  PHENERGAN Take 1 tablet (12.5 mg total) by mouth every 6 (six) hours as needed for nausea or vomiting.   rosuvastatin 10 MG tablet Commonly known as:  CRESTOR Take 10 mg by mouth daily.   traZODone 50 MG tablet Commonly known as:  DESYREL Take 100 mg by mouth at bedtime.   VITAMIN B-12 PO Take 1 tablet by mouth daily.   vitamin C 500 MG tablet Commonly known as:  ASCORBIC ACID Take 500 mg by mouth daily.   Vitamin D-3 5000 units Tabs Take 5,000 Units by mouth daily.      Follow-up Information    Fields, Marga Melnick,  MD. Schedule an appointment as soon as possible for a visit in 3 week(s).   Specialty:  Gastroenterology Why:  Hospital Follow UP  Contact information: [address] [place] [address] [phone]        [place]. Schedule an appointment as soon as possible for a visit in 1 week(s).   Specialty:  Oncology Why:  Establish care hematology appointment reschedule for anemia Contact information: [address] [id number] [place]       Keane Police, MD. Schedule an appointment as soon as possible for a visit in 1 week(s).   Specialty:  Family Medicine Why:  Hospital Follow Up Contact information: [address] [place] [address] [phone]          Allergies  Allergen Reactions  . Iodinated Diagnostic Agents Anaphylaxis  . Aspirin Other (See Comments)    "Possible blood in stool" the 322m. Can take 825m  . Ibuprofen Other (See Comments)    "Possible blood in stool"  . Sulfa Antibiotics Hives  . Zofran [Ondansetron Hcl]     Bad headaches  . Dilaudid [Hydromorphone Hcl] Itching and Palpitations  . Hydrocodone Palpitations   Allergies as of 08/21/2017      Reactions   Iodinated Diagnostic Agents Anaphylaxis   Aspirin Other (See Comments)   "Possible blood in stool" the 32544mCan take 39m67m Ibuprofen Other (See Comments)   "Possible blood in stool"   Sulfa Antibiotics Hives   Zofran [ondansetron Hcl]    Bad headaches   Dilaudid [hydromorphone Hcl] Itching, Palpitations   Hydrocodone Palpitations      Medication List    STOP taking these medications   cefdinir 300 MG capsule Commonly known as:  OMNICEF     TAKE these medications   AEROCHAMBER PLUS FLO-VU MEDIUM Misc 1 each by Other route every 6 (six) hours as needed.   albuterol 108 (90 Base) MCG/ACT inhaler Commonly known as:  PROVENTIL HFA;VENTOLIN HFA Inhale 1-2 puffs into the lungs every 6 (six) hours as needed  for wheezing or shortness of breath (with Aerochamber).   ALPRAZolam 1 MG tablet Commonly known as:  XANAX Take 0.5 tablets (0.5 mg total) by mouth 3 (three) times daily. anxiety What changed:    how much to take  additional instructions   amoxicillin-clavulanate 875-125 MG tablet Commonly known as:  AUGMENTIN Take 1 tablet by mouth 2 (two) times daily for 7 days.   aspirin EC 81 MG tablet Take 81 mg by mouth daily.   ferrous sulfate 325 (65 FE) MG tablet Take 1 tablet (325 mg total) by mouth daily.   FLUoxetine 40 MG capsule Commonly known as:  PROZAC Take 80 mg by mouth daily.   furosemide 20 MG tablet Commonly known as:  LASIX Take 20 mg by mouth daily as needed for fluid.   hydrocortisone 25 MG suppository Commonly known as:  ANUSOL-HC Place 1 suppository (25 mg total) rectally 4 (four) times daily -  with meals and at bedtime.   metFORMIN 500 MG tablet Commonly known as:  GLUCOPHAGE Take 1 tablet (500 mg total) by mouth 2 (two) times daily with a meal. What changed:  how much to take   ondansetron 4 MG tablet Commonly known as:  ZOFRAN Take 4 mg by mouth every 8 (eight) hours as needed for nausea or vomiting.   pantoprazole 40 MG tablet Commonly known as:  PROTONIX Take 40 mg by mouth daily.   potassium chloride SA 20 MEQ tablet Commonly known as:  K-DUR,KLOR-CON Take 1 tablet by mouth daily as needed (when taken with Lasix for fluid).   promethazine 12.5 MG tablet Commonly known as:  PHENERGAN Take 1 tablet (12.5 mg total) by mouth every 6 (six) hours as needed for nausea or vomiting.   rosuvastatin 10 MG tablet Commonly known as:  CRESTOR Take 10 mg by mouth daily.   traZODone 50 MG tablet Commonly known as:  DESYREL Take 100 mg by mouth at bedtime.   VITAMIN B-12 PO Take 1 tablet by mouth daily.   vitamin C 500 MG tablet Commonly known as:  ASCORBIC ACID Take 500 mg by mouth daily.   Vitamin D-3 5000 units Tabs Take 5,000 Units by mouth  daily.       Procedures/Studies: Dg Abd 1 View  Result Date: 08/20/2017 CLINICAL DATA:  Capsule study EXAM: ABDOMEN - 1 VIEW COMPARISON:  None. FINDINGS: Surgical clips in the right upper quadrant. Nonobstructed gas pattern. Calcified pelvic phleboliths. No definite metallic radiopaque foreign body is seen. IMPRESSION: 1. No definite radiopaque metallic foreign body seen 2. Nonobstructed gas pattern. Electronically Signed   By: Donavan Foil M.D.   On: 08/20/2017 16:50   US Carotid Bilateral  Result Date: 08/20/2017 CLINICAL DATA:  52 year old female with a history of syncope EXAM: BILATERAL CAROTID DUPLEX ULTRASOUND TECHNIQUE: Pearline Cables scale imaging, color Doppler and duplex ultrasound were performed of bilateral carotid and vertebral arteries in the neck. COMPARISON:  No prior duplex FINDINGS: Criteria: Quantification of carotid stenosis is based on velocity parameters that correlate the residual internal carotid diameter with NASCET-based stenosis levels, using the diameter of the distal internal carotid lumen as the denominator for stenosis measurement. The following velocity measurements were obtained: RIGHT ICA:  Systolic 426 cm/sec, Diastolic 30 cm/sec CCA:  85 cm/sec SYSTOLIC ICA/CCA RATIO:  1.2 ECA:  105 cm/sec LEFT ICA:  Systolic 79 cm/sec, Diastolic 33 cm/sec CCA:  83 cm/sec SYSTOLIC ICA/CCA RATIO:  1.0 ECA:  85 cm/sec Right Brachial SBP: Not acquired Left Brachial SBP: Not acquired RIGHT CAROTID ARTERY: No significant calcified disease of the right common carotid artery. Intermediate waveform maintained. Heterogeneous plaque without significant calcifications at the right carotid bifurcation. Low resistance waveform of the right ICA. No significant tortuosity. RIGHT VERTEBRAL ARTERY: Antegrade flow with low resistance waveform. LEFT CAROTID ARTERY: No significant calcified disease of the left  common carotid artery. Intermediate waveform maintained. Heterogeneous plaque at the left carotid  bifurcation without significant calcifications. Low resistance waveform of the left ICA. LEFT VERTEBRAL ARTERY: Left vertebral artery not visualized. IMPRESSION: Color duplex indicates minimal heterogeneous plaque, with no hemodynamically significant stenosis by duplex criteria in the extracranial cerebrovascular circulation. No left vertebral artery not visualized. Signed, Dulcy Fanny. Earleen Newport, DO [facility] Radiology Electronically Signed   By: Corrie Mckusick D.O.   On: 08/20/2017 13:41   Dg Chest Port 1 View  Result Date: 08/21/2017 CLINICAL DATA:  Pneumonia/hx cervical cancer/diabetic/chf/smoker EXAM: PORTABLE CHEST 1 VIEW COMPARISON:  08/20/2017 FINDINGS: Heart size is accentuated by technique. There has been improvement in aeration. Persistent opacity in the MEDIAL LEFT lung base obscures the hemidiaphragm. Persistent prominent diffuse airspace filling opacities warrant follow-up. IMPRESSION: Improved aeration, favoring improvement in pulmonary edema. Electronically Signed   By: Nolon Nations M.D.   On: 08/21/2017 09:54   Dg Chest Port 1 View  Result Date: 08/20/2017 CLINICAL DATA:  DOE EXAM: PORTABLE CHEST 1 VIEW COMPARISON:  08/19/2017 FINDINGS: Patient rotated rightward. There is new perihilar airspace disease. No pneumothorax. No osseous abnormality. IMPRESSION: Bilateral airspace disease suggests pulmonary edema versus multifocal pneumonia. These results will be called to the ordering clinician or representative by the Radiologist Assistant, and communication documented in the PACS or zVision Dashboard. Electronically Signed   By: Suzy Bouchard M.D.   On: 08/20/2017 09:42   Dg Chest Port 1 View  Result Date: 08/19/2017 CLINICAL DATA:  Cough, syncope EXAM: PORTABLE CHEST 1 VIEW COMPARISON:  07/11/2017 FINDINGS: Cardiomegaly. Peribronchial thickening. Interstitial prominence in the lungs could reflect interstitial edema or bronchitic changes. No  effusions or acute bony abnormality. IMPRESSION: Cardiomegaly. Peribronchial thickening compatible with bronchitic changes. Interstitial prominence could be related to bronchitis or mild interstitial edema. Electronically Signed   By: Rolm Baptise M.D.   On: 08/19/2017 19:22      Subjective: Pt has been eating, no emesis this morning.  No abdominal pain, no rectal bleeding, refusing surgery consult this morning for hemorrhoidectomy consideration.  Discharge Exam: Vitals:   08/20/17 2053 08/21/17 0449  BP: (!) 105/53 104/60  Pulse: 89 85  Resp:    Temp: 98.2 F (36.8 C) 98.6 F (37 C)  SpO2: 95% 93%   Vitals:   08/20/17 0437 08/20/17 1251 08/20/17 2053 08/21/17 0449  BP: 121/64 (!) 122/58 (!) 105/53 104/60  Pulse: 100 97 89 85  Resp:  20    Temp: 98.1 F (36.7 C) (!) 97.5 F (36.4 C) 98.2 F (36.8 C) 98.6 F (37 C)  TempSrc: Oral Oral Oral Oral  SpO2: 90% 94% 95% 93%  Weight:      Height:       General: Pt is alert, awake, not in acute distress Cardiovascular: RRR, S1/S2 +, no rubs, no gallops Respiratory: CTA bilaterally, no wheezing, no rhonchi Abdominal: Soft, NT, ND, bowel sounds + Extremities: no edema, no cyanosis   The results of significant diagnostics from this hospitalization (including imaging, microbiology, ancillary and laboratory) are listed below for reference.     Microbiology: Recent Results (from the past 240 hour(s))  Culture, Urine     Status: Abnormal (Preliminary result)   Collection Time: 08/19/17  6:54 PM  Result Value Ref Range Status   Specimen Description   Final    URINE, CLEAN CATCH Performed at [facility], [address] [place], [place] [address]    Special Requests   Final  NONE Performed at [facility], [address], [place], [place] [address]    Culture >=100,000 COLONIES/mL GRAM NEGATIVE RODS (A)  Final   Report Status PENDING  Incomplete     Labs: BNP (last 3 results) No results for input(s): BNP in the last 8760  hours. Basic Metabolic Panel: Recent Labs  Lab 08/19/17 1355 08/20/17 0524 08/21/17 0430  NA 140 139 139  K 3.3* 3.4* 3.8  CL 106 106 103  CO2 21* 21* 25  GLUCOSE 197* 189* 181*  BUN _0 CREATININE 0.80 0.75 0.74  CALCIUM 9.5 8.9 8.7*  MG  --   --  1.5*   Liver Function Tests: Recent Labs  Lab 08/19/17 1355 08/20/17 0524 08/21/17 0430  AST _1 ALT _2 ALKPHOS 93 84 78  BILITOT 0.3 0.4 0.4  PROT 8.2* 7.6 7.5  ALBUMIN 3.7 3.3* 3.2*   No results for input(s): LIPASE, AMYLASE in the last 168 hours. No results for input(s): AMMONIA in the last 168 hours. CBC: Recent Labs  Lab 08/19/17 1355 08/20/17 0524 08/21/17 0430  WBC 11.3* 16.9* 8.9  NEUTROABS  --   --  6.1  HGB 8.9* 9.1* 8.9*  HCT 29.8* 30.0* 29.1*  MCV 90.9 90.4 90.1  PLT 453* 460* 421*   Cardiac Enzymes: Recent Labs  Lab 08/19/17 2313 08/20/17 0524 08/20/17 1054  TROPONINI <0.03 <0.03 <0.03   BNP: Invalid input(s): POCBNP CBG: Recent Labs  Lab 08/20/17 1701 08/20/17 1954 08/21/17 0039 08/21/17 0448 08/21/17 0731  GLUCAP 182* 229* 147* 171* 151*   D-Dimer No results for input(s): DDIMER in the last 72 hours. Hgb A1c No results for input(s): HGBA1C in the last 72 hours. Lipid Profile Recent Labs    08/20/17 0524  CHOL 98  HDL 34*  LDLCALC 42  TRIG 109  CHOLHDL 2.9   Thyroid function studies Recent Labs    08/19/17 2313  TSH 1.952   Anemia work up No results for input(s): VITAMINB12, FOLATE, FERRITIN, TIBC, IRON, RETICCTPCT in the last 72 hours. Urinalysis    Component Value Date/Time   COLORURINE YELLOW 08/19/2017 [place] 08/19/2017 1854   LABSPEC 1.011 08/19/2017 1854   PHURINE 5.0 08/19/2017 1854   GLUCOSEU NEGATIVE 08/19/2017 1854   HGBUR NEGATIVE 08/19/2017 1854   BILIRUBINUR NEGATIVE 08/19/2017 1854   KETONESUR NEGATIVE 08/19/2017 1854   PROTEINUR NEGATIVE 08/19/2017 1854   UROBILINOGEN 0.2 10/28/2014 2330   NITRITE POSITIVE (A)  08/19/2017 1854   LEUKOCYTESUR SMALL (A) 08/19/2017 1854   Sepsis Labs Invalid input(s): PROCALCITONIN,  WBC,  LACTICIDVEN Microbiology Recent Results (from the past 240 hour(s))  Culture, Urine     Status: Abnormal (Preliminary result)   Collection Time: 08/19/17  6:54 PM  Result Value Ref Range Status   Specimen Description   Final    URINE, CLEAN CATCH Performed at [facility], [address] [place], [place] [address]    Special Requests   Final    NONE Performed at [facility], [address], [place], [place] [address]    Culture >=100,000 COLONIES/mL GRAM NEGATIVE RODS (A)  Final   Report Status PENDING  Incomplete   Time coordinating discharge: 32 minutes  SIGNED:  Irwin Brakeman, MD  [facility] 08/21/2017, 12:45 PM Pager [phone]  If 7PM-7AM, please contact night-coverage [email or web address] Password [email or web address]

## 2017-08-21 NOTE — Care Management (Signed)
CM consult for [facility] services. Per MD [facility] RN and PT will be ordered at DC. CM in to visit pt to discuss HHA options and preferences. Pt declines [facility] referral at this time as she plans to go straight to [facility] when she leaves here.

## 2017-08-22 ENCOUNTER — Encounter: Payer: Self-pay | Admitting: Gastroenterology

## 2017-08-22 LAB — URINE CULTURE: Culture: >=100000 — AB

## 2017-08-22 LAB — GLUCOSE, CAPILLARY: GLUCOSE-CAPILLARY: 103 mg/dL — AB (ref 65–99)

## 2017-08-22 NOTE — Telephone Encounter (Signed)
PATIENT SCHEDULED  °

## 2017-08-24 ENCOUNTER — Inpatient Hospital Stay (HOSPITAL_COMMUNITY): Payer: Medicare PPO

## 2017-08-24 ENCOUNTER — Inpatient Hospital Stay (HOSPITAL_COMMUNITY): Payer: Medicare PPO | Admitting: Certified Registered Nurse Anesthetist

## 2017-08-24 ENCOUNTER — Other Ambulatory Visit: Payer: Self-pay

## 2017-08-24 ENCOUNTER — Inpatient Hospital Stay (HOSPITAL_COMMUNITY)
Admission: AD | Admit: 2017-08-24 | Discharge: 2017-09-18 | DRG: 870 | Disposition: A | Discharge status: Home Health | Payer: Medicare PPO | Source: Other Acute Inpatient Hospital | Attending: Internal Medicine | Admitting: Internal Medicine

## 2017-08-24 ENCOUNTER — Encounter (HOSPITAL_COMMUNITY): Payer: Self-pay

## 2017-08-24 DIAGNOSIS — J101 Influenza due to other identified influenza virus with other respiratory manifestations: Secondary | ICD-10-CM | POA: Diagnosis not present

## 2017-08-24 DIAGNOSIS — E785 Hyperlipidemia, unspecified: Secondary | ICD-10-CM | POA: Diagnosis present

## 2017-08-24 DIAGNOSIS — Z7982 Long term (current) use of aspirin: Secondary | ICD-10-CM

## 2017-08-24 DIAGNOSIS — R1310 Dysphagia, unspecified: Secondary | ICD-10-CM | POA: Diagnosis not present

## 2017-08-24 DIAGNOSIS — E874 Mixed disorder of acid-base balance: Secondary | ICD-10-CM | POA: Diagnosis not present

## 2017-08-24 DIAGNOSIS — Z9049 Acquired absence of other specified parts of digestive tract: Secondary | ICD-10-CM | POA: Diagnosis not present

## 2017-08-24 DIAGNOSIS — I421 Obstructive hypertrophic cardiomyopathy: Secondary | ICD-10-CM | POA: Diagnosis present

## 2017-08-24 DIAGNOSIS — J1000 Influenza due to other identified influenza virus with unspecified type of pneumonia: Secondary | ICD-10-CM | POA: Diagnosis present

## 2017-08-24 DIAGNOSIS — R0603 Acute respiratory distress: Secondary | ICD-10-CM

## 2017-08-24 DIAGNOSIS — Z789 Other specified health status: Secondary | ICD-10-CM

## 2017-08-24 DIAGNOSIS — Z4659 Encounter for fitting and adjustment of other gastrointestinal appliance and device: Secondary | ICD-10-CM

## 2017-08-24 DIAGNOSIS — Z978 Presence of other specified devices: Secondary | ICD-10-CM

## 2017-08-24 DIAGNOSIS — Z882 Allergy status to sulfonamides status: Secondary | ICD-10-CM | POA: Diagnosis not present

## 2017-08-24 DIAGNOSIS — R609 Edema, unspecified: Secondary | ICD-10-CM | POA: Diagnosis not present

## 2017-08-24 DIAGNOSIS — R0489 Hemorrhage from other sites in respiratory passages: Secondary | ICD-10-CM | POA: Diagnosis not present

## 2017-08-24 DIAGNOSIS — A419 Sepsis, unspecified organism: Secondary | ICD-10-CM | POA: Diagnosis present

## 2017-08-24 DIAGNOSIS — J9600 Acute respiratory failure, unspecified whether with hypoxia or hypercapnia: Secondary | ICD-10-CM | POA: Diagnosis not present

## 2017-08-24 DIAGNOSIS — Z885 Allergy status to narcotic agent status: Secondary | ICD-10-CM | POA: Diagnosis not present

## 2017-08-24 DIAGNOSIS — J9690 Respiratory failure, unspecified, unspecified whether with hypoxia or hypercapnia: Secondary | ICD-10-CM

## 2017-08-24 DIAGNOSIS — Z9289 Personal history of other medical treatment: Secondary | ICD-10-CM

## 2017-08-24 DIAGNOSIS — R570 Cardiogenic shock: Secondary | ICD-10-CM | POA: Diagnosis not present

## 2017-08-24 DIAGNOSIS — F1320 Sedative, hypnotic or anxiolytic dependence, uncomplicated: Secondary | ICD-10-CM | POA: Diagnosis present

## 2017-08-24 DIAGNOSIS — J189 Pneumonia, unspecified organism: Secondary | ICD-10-CM | POA: Diagnosis present

## 2017-08-24 DIAGNOSIS — F329 Major depressive disorder, single episode, unspecified: Secondary | ICD-10-CM | POA: Diagnosis present

## 2017-08-24 DIAGNOSIS — Z888 Allergy status to other drugs, medicaments and biological substances status: Secondary | ICD-10-CM | POA: Diagnosis not present

## 2017-08-24 DIAGNOSIS — J810 Acute pulmonary edema: Secondary | ICD-10-CM | POA: Diagnosis not present

## 2017-08-24 DIAGNOSIS — R579 Shock, unspecified: Secondary | ICD-10-CM | POA: Diagnosis not present

## 2017-08-24 DIAGNOSIS — F410 Panic disorder [episodic paroxysmal anxiety] without agoraphobia: Secondary | ICD-10-CM | POA: Diagnosis present

## 2017-08-24 DIAGNOSIS — Z8249 Family history of ischemic heart disease and other diseases of the circulatory system: Secondary | ICD-10-CM

## 2017-08-24 DIAGNOSIS — B961 Klebsiella pneumoniae [K. pneumoniae] as the cause of diseases classified elsewhere: Secondary | ICD-10-CM | POA: Diagnosis present

## 2017-08-24 DIAGNOSIS — Z452 Encounter for adjustment and management of vascular access device: Secondary | ICD-10-CM

## 2017-08-24 DIAGNOSIS — R6521 Severe sepsis with septic shock: Secondary | ICD-10-CM | POA: Diagnosis present

## 2017-08-24 DIAGNOSIS — Z8541 Personal history of malignant neoplasm of cervix uteri: Secondary | ICD-10-CM

## 2017-08-24 DIAGNOSIS — R042 Hemoptysis: Secondary | ICD-10-CM

## 2017-08-24 DIAGNOSIS — E781 Pure hyperglyceridemia: Secondary | ICD-10-CM | POA: Diagnosis present

## 2017-08-24 DIAGNOSIS — Z9911 Dependence on respirator [ventilator] status: Secondary | ICD-10-CM

## 2017-08-24 DIAGNOSIS — Z825 Family history of asthma and other chronic lower respiratory diseases: Secondary | ICD-10-CM

## 2017-08-24 DIAGNOSIS — J9601 Acute respiratory failure with hypoxia: Secondary | ICD-10-CM

## 2017-08-24 DIAGNOSIS — I5033 Acute on chronic diastolic (congestive) heart failure: Secondary | ICD-10-CM | POA: Diagnosis not present

## 2017-08-24 DIAGNOSIS — J9602 Acute respiratory failure with hypercapnia: Secondary | ICD-10-CM | POA: Diagnosis not present

## 2017-08-24 DIAGNOSIS — I5030 Unspecified diastolic (congestive) heart failure: Secondary | ICD-10-CM

## 2017-08-24 DIAGNOSIS — Y95 Nosocomial condition: Secondary | ICD-10-CM | POA: Diagnosis present

## 2017-08-24 DIAGNOSIS — E876 Hypokalemia: Secondary | ICD-10-CM | POA: Diagnosis not present

## 2017-08-24 DIAGNOSIS — R140 Abdominal distension (gaseous): Secondary | ICD-10-CM

## 2017-08-24 DIAGNOSIS — G9341 Metabolic encephalopathy: Secondary | ICD-10-CM | POA: Diagnosis not present

## 2017-08-24 DIAGNOSIS — Z91041 Radiographic dye allergy status: Secondary | ICD-10-CM

## 2017-08-24 DIAGNOSIS — E871 Hypo-osmolality and hyponatremia: Secondary | ICD-10-CM | POA: Diagnosis not present

## 2017-08-24 DIAGNOSIS — Z800 Family history of malignant neoplasm of digestive organs: Secondary | ICD-10-CM

## 2017-08-24 DIAGNOSIS — I340 Nonrheumatic mitral (valve) insufficiency: Secondary | ICD-10-CM | POA: Diagnosis not present

## 2017-08-24 DIAGNOSIS — R0602 Shortness of breath: Secondary | ICD-10-CM | POA: Diagnosis present

## 2017-08-24 DIAGNOSIS — Z886 Allergy status to analgesic agent status: Secondary | ICD-10-CM

## 2017-08-24 DIAGNOSIS — I080 Rheumatic disorders of both mitral and aortic valves: Secondary | ICD-10-CM | POA: Diagnosis present

## 2017-08-24 DIAGNOSIS — E870 Hyperosmolality and hypernatremia: Secondary | ICD-10-CM | POA: Diagnosis not present

## 2017-08-24 DIAGNOSIS — E1165 Type 2 diabetes mellitus with hyperglycemia: Secondary | ICD-10-CM | POA: Diagnosis present

## 2017-08-24 DIAGNOSIS — F17210 Nicotine dependence, cigarettes, uncomplicated: Secondary | ICD-10-CM | POA: Diagnosis present

## 2017-08-24 DIAGNOSIS — D500 Iron deficiency anemia secondary to blood loss (chronic): Secondary | ICD-10-CM | POA: Diagnosis present

## 2017-08-24 DIAGNOSIS — N390 Urinary tract infection, site not specified: Secondary | ICD-10-CM | POA: Diagnosis present

## 2017-08-24 DIAGNOSIS — I110 Hypertensive heart disease with heart failure: Secondary | ICD-10-CM | POA: Diagnosis present

## 2017-08-24 DIAGNOSIS — F419 Anxiety disorder, unspecified: Secondary | ICD-10-CM | POA: Diagnosis present

## 2017-08-24 DIAGNOSIS — J80 Acute respiratory distress syndrome: Secondary | ICD-10-CM | POA: Diagnosis present

## 2017-08-24 DIAGNOSIS — Z823 Family history of stroke: Secondary | ICD-10-CM

## 2017-08-24 DIAGNOSIS — I272 Pulmonary hypertension, unspecified: Secondary | ICD-10-CM | POA: Diagnosis not present

## 2017-08-24 DIAGNOSIS — Z7984 Long term (current) use of oral hypoglycemic drugs: Secondary | ICD-10-CM

## 2017-08-24 DIAGNOSIS — R0902 Hypoxemia: Secondary | ICD-10-CM

## 2017-08-24 DIAGNOSIS — L8990 Pressure ulcer of unspecified site, unspecified stage: Secondary | ICD-10-CM

## 2017-08-24 LAB — BLOOD GAS, ARTERIAL
ACID-BASE DEFICIT: 7.9 mmol/L — AB (ref 0.0–2.0)
Bicarbonate: 20.1 mmol/L (ref 20.0–28.0)
Drawn by: 270211
FIO2: 1
O2 SAT: 67.5 %
PEEP: 10 cmH2O
PH ART: 7.177 — AB (ref 7.350–7.450)
Patient temperature: 98.6
RATE: 20 resp/min
VT: 470 mL
pCO2 arterial: 56.6 mmHg — ABNORMAL HIGH (ref 32.0–48.0)
pO2, Arterial: 49.5 mmHg — ABNORMAL LOW (ref 83.0–108.0)

## 2017-08-24 LAB — ABO/RH: ABO/RH(D): O POS

## 2017-08-24 LAB — GLUCOSE, CAPILLARY
GLUCOSE-CAPILLARY: 175 mg/dL — AB (ref 65–99)
Glucose-Capillary: 218 mg/dL — ABNORMAL HIGH (ref 65–99)
Glucose-Capillary: 357 mg/dL — ABNORMAL HIGH (ref 65–99)
Glucose-Capillary: 417 mg/dL — ABNORMAL HIGH (ref 65–99)
Glucose-Capillary: 166 mg/dL — ABNORMAL HIGH (ref 65–99)

## 2017-08-24 LAB — COMPREHENSIVE METABOLIC PANEL
ALBUMIN: 3.1 g/dL — AB (ref 3.5–5.0)
ALT: 42 U/L (ref 14–54)
AST: 48 U/L — AB (ref 15–41)
Alkaline Phosphatase: 76 U/L (ref 38–126)
Anion gap: 14 (ref 5–15)
BUN: 11 mg/dL (ref 6–20)
CO2: 18 mmol/L — AB (ref 22–32)
CREATININE: 0.82 mg/dL (ref 0.44–1.00)
Calcium: 8.5 mg/dL — ABNORMAL LOW (ref 8.9–10.3)
Chloride: 106 mmol/L (ref 101–111)
GFR calc Af Amer: >60 mL/min (ref >60)
GLUCOSE: 273 mg/dL — AB (ref 65–99)
Potassium: 3.9 mmol/L (ref 3.5–5.1)
SODIUM: 138 mmol/L (ref 135–145)
Total Bilirubin: 0.7 mg/dL (ref 0.3–1.2)
Total Protein: 7.1 g/dL (ref 6.5–8.1)

## 2017-08-24 LAB — APTT: aPTT: 37 seconds — ABNORMAL HIGH (ref 24–36)

## 2017-08-24 LAB — CBC WITH DIFFERENTIAL/PLATELET
BASOS ABS: 0 10*3/uL (ref 0.0–0.1)
Basophils Relative: 0 %
Eosinophils Absolute: 0 10*3/uL (ref 0.0–0.7)
Eosinophils Relative: 0 %
HCT: 28.2 % — ABNORMAL LOW (ref 36.0–46.0)
Hemoglobin: 8.6 g/dL — ABNORMAL LOW (ref 12.0–15.0)
Lymphocytes Relative: 2 %
Lymphs Abs: 0.6 10*3/uL — ABNORMAL LOW (ref 0.7–4.0)
MCH: 27.5 pg (ref 26.0–34.0)
MCHC: 30.5 g/dL (ref 30.0–36.0)
MCV: 90.1 fL (ref 78.0–100.0)
Monocytes Absolute: 0.7 10*3/uL (ref 0.1–1.0)
Monocytes Relative: 3 %
NEUTROS ABS: 23.6 10*3/uL — AB (ref 1.7–7.7)
NEUTROS PCT: 95 %
PLATELETS: 472 10*3/uL — AB (ref 150–400)
RBC: 3.13 MIL/uL — AB (ref 3.87–5.11)
RDW: 15.2 % (ref 11.5–15.5)
WBC: 24.9 10*3/uL — ABNORMAL HIGH (ref 4.0–10.5)

## 2017-08-24 LAB — HEMOGLOBIN AND HEMATOCRIT, BLOOD
HCT: 27 % — ABNORMAL LOW (ref 36.0–46.0)
Hemoglobin: 8.4 g/dL — ABNORMAL LOW (ref 12.0–15.0)

## 2017-08-24 LAB — TROPONIN I: Troponin I: <0.03 ng/mL (ref <0.03)

## 2017-08-24 LAB — LACTIC ACID, PLASMA
Lactic Acid, Venous: 2.8 mmol/L (ref 0.5–1.9)
Lactic Acid, Venous: 1.7 mmol/L (ref 0.5–1.9)

## 2017-08-24 LAB — TYPE AND SCREEN
ABO/RH(D): O POS
ANTIBODY SCREEN: NEGATIVE

## 2017-08-24 LAB — PROCALCITONIN: Procalcitonin: 6.34 ng/mL

## 2017-08-24 LAB — BRAIN NATRIURETIC PEPTIDE: B NATRIURETIC PEPTIDE 5: 140.5 pg/mL — AB (ref 0.0–100.0)

## 2017-08-24 LAB — MAGNESIUM: Magnesium: 1.7 mg/dL (ref 1.7–2.4)

## 2017-08-24 LAB — PROTIME-INR
INR: 1.23
Prothrombin Time: 15.4 seconds — ABNORMAL HIGH (ref 11.4–15.2)

## 2017-08-24 LAB — MRSA PCR SCREENING: MRSA by PCR: NEGATIVE

## 2017-08-24 MED ORDER — DIPHENHYDRAMINE HCL 50 MG PO CAPS: 50.0000 mg | ORAL_CAPSULE | Freq: Once | ORAL | Status: DC

## 2017-08-24 MED ORDER — LACTATED RINGERS IV SOLN
INTRAVENOUS | Status: DC
  Administered 2017-08-24 – 2017-08-26 (×3): via INTRAVENOUS

## 2017-08-24 MED ORDER — HYDROCORTISONE NA SUCCINATE PF 250 MG IJ SOLR: 200.0000 mg | Freq: Once | INTRAMUSCULAR | Status: DC

## 2017-08-24 MED ORDER — PIPERACILLIN-TAZOBACTAM 3.375 G IVPB
3.3750 g | Freq: Once | INTRAVENOUS | Status: AC
  Administered 2017-08-24: 3.375 g via INTRAVENOUS
  Filled 2017-08-24: qty 50

## 2017-08-24 MED ORDER — MIDAZOLAM HCL 2 MG/2ML IJ SOLN
2.0000 mg | INTRAMUSCULAR | Status: DC | PRN
  Filled 2017-08-24: qty 2

## 2017-08-24 MED ORDER — INSULIN ASPART 100 UNIT/ML ~~LOC~~ SOLN
0.0000 [IU] | SUBCUTANEOUS | Status: DC
  Administered 2017-08-24: 15 [IU] via SUBCUTANEOUS
  Administered 2017-08-24: 3 [IU] via SUBCUTANEOUS
  Administered 2017-08-24: 5 [IU] via SUBCUTANEOUS

## 2017-08-24 MED ORDER — DEXMEDETOMIDINE HCL IN NACL 200 MCG/50ML IV SOLN
0.4000 ug/kg/h | INTRAVENOUS | Status: DC
  Administered 2017-08-24 (×3): 0.4 ug/kg/h via INTRAVENOUS
  Administered 2017-08-24: 0.7 ug/kg/h via INTRAVENOUS
  Filled 2017-08-24: qty 100
  Filled 2017-08-24 (×3): qty 50

## 2017-08-24 MED ORDER — SUCCINYLCHOLINE CHLORIDE 200 MG/10ML IV SOSY
PREFILLED_SYRINGE | INTRAVENOUS | Status: AC
  Filled 2017-08-24: qty 10

## 2017-08-24 MED ORDER — PANTOPRAZOLE SODIUM 40 MG IV SOLR
40.0000 mg | Freq: Two times a day (BID) | INTRAVENOUS | Status: DC
  Administered 2017-08-24 – 2017-09-02 (×19): 40 mg via INTRAVENOUS
  Filled 2017-08-24 (×19): qty 40

## 2017-08-24 MED ORDER — IPRATROPIUM-ALBUTEROL 0.5-2.5 (3) MG/3ML IN SOLN: 3.0000 mL | RESPIRATORY_TRACT | Status: DC | PRN

## 2017-08-24 MED ORDER — LORAZEPAM 2 MG/ML IJ SOLN
1.0000 mg | INTRAMUSCULAR | Status: DC
  Administered 2017-08-24 – 2017-08-25 (×3): 1 mg via INTRAVENOUS
  Filled 2017-08-24 (×3): qty 1

## 2017-08-24 MED ORDER — SODIUM CHLORIDE 0.9 % IV BOLUS (SEPSIS)
1000.0000 mL | Freq: Once | INTRAVENOUS | Status: AC
  Administered 2017-08-24: 1000 mL via INTRAVENOUS

## 2017-08-24 MED ORDER — MAGNESIUM SULFATE 2 GM/50ML IV SOLN
2.0000 g | Freq: Once | INTRAVENOUS | Status: AC
  Administered 2017-08-24: 2 g via INTRAVENOUS
  Filled 2017-08-24: qty 50

## 2017-08-24 MED ORDER — PIPERACILLIN-TAZOBACTAM 3.375 G IVPB
3.3750 g | Freq: Three times a day (TID) | INTRAVENOUS | Status: DC
  Administered 2017-08-24 – 2017-08-29 (×15): 3.375 g via INTRAVENOUS
  Filled 2017-08-24 (×15): qty 50

## 2017-08-24 MED ORDER — INSULIN GLARGINE 100 UNIT/ML ~~LOC~~ SOLN
8.0000 [IU] | Freq: Every day | SUBCUTANEOUS | Status: DC
  Filled 2017-08-24: qty 0.08

## 2017-08-24 MED ORDER — INSULIN ASPART 100 UNIT/ML ~~LOC~~ SOLN: 3.0000 [IU] | SUBCUTANEOUS | Status: DC

## 2017-08-24 MED ORDER — VANCOMYCIN HCL IN DEXTROSE 750-5 MG/150ML-% IV SOLN
750.0000 mg | Freq: Two times a day (BID) | INTRAVENOUS | Status: DC
  Administered 2017-08-24 – 2017-08-26 (×4): 750 mg via INTRAVENOUS
  Filled 2017-08-24 (×6): qty 150

## 2017-08-24 MED ORDER — VANCOMYCIN HCL IN DEXTROSE 1-5 GM/200ML-% IV SOLN
1000.0000 mg | Freq: Once | INTRAVENOUS | Status: AC
  Administered 2017-08-24: 1000 mg via INTRAVENOUS
  Filled 2017-08-24: qty 200

## 2017-08-24 MED ORDER — FENTANYL CITRATE (PF) 100 MCG/2ML IJ SOLN
INTRAMUSCULAR | Status: AC
  Administered 2017-08-24: 100 ug
  Filled 2017-08-24: qty 2

## 2017-08-24 MED ORDER — PROMETHAZINE HCL 25 MG/ML IJ SOLN
12.5000 mg | Freq: Four times a day (QID) | INTRAMUSCULAR | Status: DC | PRN
  Administered 2017-08-24: 25 mg via INTRAVENOUS
  Filled 2017-08-24: qty 1

## 2017-08-24 MED ORDER — SODIUM CHLORIDE 0.9 % IV SOLN
INTRAVENOUS | Status: DC
  Administered 2017-08-25: via INTRAVENOUS

## 2017-08-24 MED ORDER — PROPOFOL 10 MG/ML IV BOLUS
INTRAVENOUS | Status: DC | PRN
  Administered 2017-08-24: 65 mg via INTRAVENOUS

## 2017-08-24 MED ORDER — DIPHENHYDRAMINE HCL 50 MG/ML IJ SOLN: 50.0000 mg | Freq: Once | INTRAMUSCULAR | Status: DC

## 2017-08-24 MED ORDER — ORAL CARE MOUTH RINSE: 15.0000 mL | Freq: Two times a day (BID) | OROMUCOSAL | Status: DC

## 2017-08-24 MED ORDER — PROPOFOL 10 MG/ML IV BOLUS
INTRAVENOUS | Status: AC
  Filled 2017-08-24: qty 20

## 2017-08-24 MED ORDER — INSULIN GLARGINE 100 UNIT/ML ~~LOC~~ SOLN
15.0000 [IU] | Freq: Every day | SUBCUTANEOUS | Status: DC
  Administered 2017-08-24 – 2017-08-25 (×2): 15 [IU] via SUBCUTANEOUS
  Filled 2017-08-24 (×2): qty 0.15

## 2017-08-24 MED ORDER — IPRATROPIUM-ALBUTEROL 0.5-2.5 (3) MG/3ML IN SOLN
3.0000 mL | Freq: Four times a day (QID) | RESPIRATORY_TRACT | Status: DC
  Administered 2017-08-24 – 2017-09-05 (×47): 3 mL via RESPIRATORY_TRACT
  Filled 2017-08-24 (×48): qty 3

## 2017-08-24 MED ORDER — MIDAZOLAM HCL 2 MG/2ML IJ SOLN
INTRAMUSCULAR | Status: AC
  Administered 2017-08-24: 2 mg
  Filled 2017-08-24: qty 2

## 2017-08-24 MED ORDER — ACETAMINOPHEN 325 MG PO TABS
650.0000 mg | ORAL_TABLET | Freq: Four times a day (QID) | ORAL | Status: DC | PRN
  Administered 2017-08-25 – 2017-08-30 (×12): 650 mg via ORAL
  Filled 2017-08-24 (×13): qty 2

## 2017-08-24 MED ORDER — INSULIN ASPART 100 UNIT/ML ~~LOC~~ SOLN
0.0000 [IU] | SUBCUTANEOUS | Status: DC
  Administered 2017-08-25 (×2): 3 [IU] via SUBCUTANEOUS

## 2017-08-24 MED ORDER — FENTANYL 2500MCG IN NS 250ML (10MCG/ML) PREMIX INFUSION
0.0000 ug/h | INTRAVENOUS | Status: DC
  Administered 2017-08-24: 100 ug/h via INTRAVENOUS
  Administered 2017-08-25 – 2017-08-26 (×5): 250 ug/h via INTRAVENOUS
  Administered 2017-08-27: 300 ug/h via INTRAVENOUS
  Administered 2017-08-27: 250 ug/h via INTRAVENOUS
  Administered 2017-08-28: 300 ug/h via INTRAVENOUS
  Administered 2017-08-28: 350 ug/h via INTRAVENOUS
  Administered 2017-08-29 – 2017-08-30 (×3): 200 ug/h via INTRAVENOUS
  Administered 2017-08-31: 300 ug/h via INTRAVENOUS
  Filled 2017-08-24 (×14): qty 250

## 2017-08-24 MED ORDER — ACETAMINOPHEN 650 MG RE SUPP
650.0000 mg | Freq: Four times a day (QID) | RECTAL | Status: DC | PRN
  Administered 2017-08-25 – 2017-08-28 (×4): 650 mg via RECTAL
  Filled 2017-08-24 (×4): qty 1

## 2017-08-24 MED ORDER — SUCCINYLCHOLINE CHLORIDE 20 MG/ML IJ SOLN
INTRAMUSCULAR | Status: DC | PRN
  Administered 2017-08-24: 100 mg via INTRAVENOUS

## 2017-08-24 MED ORDER — PHENYLEPHRINE HCL-NACL 10-0.9 MG/250ML-% IV SOLN
0.0000 ug/min | INTRAVENOUS | Status: DC
  Administered 2017-08-24: 20 ug/min via INTRAVENOUS
  Administered 2017-08-25: 110 ug/min via INTRAVENOUS
  Administered 2017-08-25: 80 ug/min via INTRAVENOUS
  Administered 2017-08-25: 120 ug/min via INTRAVENOUS
  Administered 2017-08-25: 100 ug/min via INTRAVENOUS
  Filled 2017-08-24: qty 750
  Filled 2017-08-24 (×2): qty 250

## 2017-08-24 MED ORDER — ENOXAPARIN SODIUM 40 MG/0.4ML ~~LOC~~ SOLN
40.0000 mg | SUBCUTANEOUS | Status: DC
  Administered 2017-08-24 – 2017-08-30 (×7): 40 mg via SUBCUTANEOUS
  Filled 2017-08-24 (×7): qty 0.4

## 2017-08-24 MED ORDER — FENTANYL BOLUS VIA INFUSION
50.0000 ug | INTRAVENOUS | Status: DC | PRN
  Administered 2017-08-24: 50 ug via INTRAVENOUS
  Filled 2017-08-24: qty 50

## 2017-08-24 MED ORDER — MIDAZOLAM HCL 2 MG/2ML IJ SOLN
2.0000 mg | INTRAMUSCULAR | Status: DC | PRN
  Administered 2017-08-25: 2 mg via INTRAVENOUS

## 2017-08-24 MED ORDER — CHLORHEXIDINE GLUCONATE 0.12 % MT SOLN
15.0000 mL | Freq: Two times a day (BID) | OROMUCOSAL | Status: DC
  Administered 2017-08-24: 15 mL via OROMUCOSAL
  Filled 2017-08-24: qty 15

## 2017-08-24 NOTE — Progress Notes (Signed)
Leolia Vinzant Kunkler is a 52 y.o. female with medical history significant of mitral stenosis, DM type II, CHF last EF 65-70% with grade 1 diastolic dysfunction She was discharged from [facility] on 2/27 after treatment for multifocal PNA, discharged on Augmentin, now presenting with worsening dyspnea, Was noted to be hypoxic with EMS and requiring BiPAP in ED.  nfluenza PCR negative. CXR with bilateral infiltrates read as pulm edema, but BNP only 50 and recent echo with preserved EF, grade 1 diastolic dysfunction. She was admitted for acute respiratory failure with hypoxia secondary to health care associated pneumonia.  Pulmonology consulted.   Plan: 1. Remain on BIPAP, appreciate pulmonology recommendations.  2. Elevated D dimer, will need V/Q Scan once she is off BIPAP.  3. Resume IV antibiotics and duonebs.    Hosie Poisson, MD [phone]

## 2017-08-24 NOTE — Progress Notes (Signed)
At 1740 pt called out and stated she was feeling nauseas. This RN went into the room and immediately removed her BIPAP mask and put on a HFNC at 15L.  Pt became hypoxic and had hematemesis.  Called [facility] RN to come to assist.  Dr. Karleen Hampshire, Respiratory and Dr. Pearline Cables made aware.  PT was intubated a few moments later by the CRNA.    Irven [place], RN

## 2017-08-24 NOTE — Anesthesia Procedure Notes (Addendum)
Procedure Name: Intubation Date/Time: 08/24/2017 5:45 PM Performed by: West Pugh, CRNA Pre-anesthesia Checklist: Patient identified, Emergency Drugs available, Suction available, Patient being monitored and Timeout performed Patient Re-evaluated:Patient Re-evaluated prior to induction Oxygen Delivery Method: Ambu bag Preoxygenation: Pre-oxygenation with 100% oxygen Induction Type: Rapid sequence, Cricoid Pressure applied and IV induction Laryngoscope Size: 3 and Glidescope Grade View: Grade I Tube type: Subglottic suction tube Tube size: 7.5 mm Number of attempts: 1 Airway Equipment and Method: Stylet and Video-laryngoscopy Placement Confirmation: ETT inserted through vocal cords under direct vision,  CO2 detector and breath sounds checked- equal and bilateral Secured at: 23 cm Tube secured with: Tape Dental Injury: Teeth and Oropharynx as per pre-operative assessment  Difficulty Due To: Difficulty was anticipated Comments: Difficulty anticipated due to emesis prior to intubation. Visualized cords with glidescope and pink/red secretions noted in airway. ETT placed without difficulty and color change CO2 detector confirmed tracheal intubation. Lots of red secretions suction from ETT once secured.

## 2017-08-24 NOTE — Progress Notes (Signed)
Pharmacy Antibiotic Note  Holly Figueroa is a 52 y.o. female admitted on 08/24/2017 with sepsis.  Pharmacy has been consulted for Vancomycin and Zosyn dosing.  Plan: Zosyn 3.375g IV q8h (4 hour infusion).   Vancomycin 1gm iv x1 (given), then vancomycin 754m iv q12hr  Goal AUC = 400 - 500 for all indications, except meningitis (goal AUC > 500 and Cmin 15-20 mcg/mL)   Height: _0  (167.6 cm) Weight: 180 lb 5.4 oz (81.8 kg) IBW/kg (Calculated) : 59.3  Temp (24hrs), Avg:98.2 F (36.8 C), Min:98.2 F (36.8 C), Max:98.2 F (36.8 C)  Recent Labs  Lab 08/19/17 1355 08/20/17 0524 08/21/17 0430  WBC 11.3* 16.9* 8.9  CREATININE 0.80 0.75 0.74    Estimated Creatinine Clearance: 90.7 mL/min (by C-G formula based on SCr of 0.74 mg/dL).    Allergies  Allergen Reactions  . Iodinated Diagnostic Agents Anaphylaxis  . Aspirin Other (See Comments)    "Possible blood in stool" the 3265m Can take 8170m . Ibuprofen Other (See Comments)    "Possible blood in stool"  . Sulfa Antibiotics Hives  . Zofran [Ondansetron Hcl]     Bad headaches  . Dilaudid [Hydromorphone Hcl] Itching and Palpitations  . Hydrocodone Palpitations    Antimicrobials this admission: Vancomycin 08/24/2017 >> Zosyn 08/24/2017 >>   Dose adjustments this admission: -  Microbiology results: pending  Thank you for allowing pharmacy to be a part of this patient's care.  GriNani Skillernowford 08/24/2017 4:39 AM

## 2017-08-24 NOTE — H&P (Addendum)
History and Physical    Rashad Obeid Lacek OAC:[id number] DOB: 1965/07/31 DOA: 08/24/2017  Referring MD/NP/PA:Dr. Noberto Retort PCP: Keane Police, MD  Patient coming from: [facility] transfer  Chief Complaint: Shortness of breath  I have personally briefly reviewed patient's old medical records in [place]   HPI: NADIA VIAR is a 52 y.o. female with medical history significant of mitral stenosis, DM type II, CHF last EF 65-70% with grade 1 diastolic dysfunction She was discharged from [facility] on 2/27 after treatment for multifocal PNA, discharged on Augmentin, now presenting with worsening dyspnea to the outside facility.  Patient reported coughing up blood as well as blood in stools for which she was being evaluated by gastroenterology, but states they could not find a source.  Patient had recent colonoscopy.  Was noted to be hypoxic with EMS and requiring BiPAP in ED.  Vital signs revealed temperature 100 F BP 139/63, HR 119, RR 24, sat 96% on BiPAP. WBC is 17k, hemoglobin 9, BUN/CR 8 and 0.97, magnesium low (replaced), d-dimer 1.64, troponin negative, but electrolytes o/w wnl. Influenza PCR negative.  ABG revealed pH 7.44, PaCO2 28.3, and PCO2 57.7.  CXR with bilateral infiltrates read as pulm edema, but BNP only 50 and recent echo with preserved EF, grade 1 diastolic dysfunction. She was treated with 1 L of normal saline IV fluids, IV Solu-Medrol, vanc, Zosyn, magnesium, and later given 40 mg of Lasix IV. HR and RR improved after being placed on BiPAP, BP remains stable. No SDU beds available at their facility or at [facility] and therefore patient accepted at [facility]..    ED Course: As seen above   Review of Systems  Constitutional: Positive for malaise/fatigue.  HENT: Negative for congestion and nosebleeds.   Eyes: Negative for photophobia.  Respiratory: Positive for cough, hemoptysis and shortness of breath.   Cardiovascular: Positive for chest pain (With  coughing). Negative for leg swelling.  Gastrointestinal: Positive for abdominal pain and blood in stool.  Genitourinary: Negative for dysuria and frequency.  Musculoskeletal: Negative for falls and myalgias.  Skin: Negative for itching and rash.  Neurological: Negative for speech change and focal weakness.  Endo/Heme/Allergies: Negative for polydipsia. Bruises/bleeds easily.  Psychiatric/Behavioral: Negative for suicidal ideas. The patient is nervous/anxious.     Past Medical History:  Diagnosis Date  . Acute respiratory failure ([place])   . Anemia 08-2008   Blood transfusion  . Anxiety   . Benzodiazepine dependence ([place])   . Cervical cancer ([place])   . CHF (congestive heart failure) ([place])   . Depression   . Diabetes mellitus without complication ([place])   . Headache(784.0)    migraines  . Hypokalemia   . Legionella pneumonia ([place])   . Leukocytosis, unspecified   . Mitral stenosis   . Panic attacks   . Tobacco abuse     Past Surgical History:  Procedure Laterality Date  . BIOPSY  06/12/2017   Procedure: BIOPSY;  Surgeon: Daneil Dolin, MD;  Location: [facility];  Service: Gastroenterology;;  esophagus  . cardiac valve repair  2011   stretched mitral valve  . CERVICAL CONIZATION W/BX  2000  . CHOLECYSTECTOMY  06/26/2012   Procedure: LAPAROSCOPIC CHOLECYSTECTOMY WITH INTRAOPERATIVE CHOLANGIOGRAM;  Surgeon: Ralene Ok, MD;  Location: [facility] ORS;  Service: General;  Laterality: N/A;  . COLONOSCOPY WITH PROPOFOL N/A 06/12/2017   Normal colon, normal TI, Grade I internal hemorrhoids  . ESOPHAGOGASTRODUODENOSCOPY (EGD) WITH PROPOFOL N/A 06/12/2017   esophagitis, possibly chemical  or pill-induced, s/p dilation. small hiatal hernia, normal duodenal bulb and D 2  . GIVENS CAPSULE STUDY N/A 07/19/2017   incomplete capsule study as did not reach cecum.   Marland Kitchen LASER ABLATION OF THE CERVIX    . MALONEY DILATION  06/12/2017   Procedure: MALONEY DILATION;  Surgeon: Daneil Dolin, MD;   Location: [facility];  Service: Gastroenterology;;  . MOLE REMOVAL    . NASAL SINUS SURGERY       reports that she has been smoking cigarettes.  She has a 23.00 pack-year smoking history. she has never used smokeless tobacco. She reports that she does not drink alcohol or use drugs.  Allergies  Allergen Reactions  . Iodinated Diagnostic Agents Anaphylaxis  . Aspirin Other (See Comments)    "Possible blood in stool" the 322m. Can take 863m  . Ibuprofen Other (See Comments)    "Possible blood in stool"  . Sulfa Antibiotics Hives  . Zofran [Ondansetron Hcl]     Bad headaches  . Dilaudid [Hydromorphone Hcl] Itching and Palpitations  . Hydrocodone Palpitations    Family History  Problem Relation Age of Onset  . Heart disease Father   . COPD Father   . Heart failure Maternal Grandmother   . Stroke Mother   . Colon cancer Brother [id number]     deceased   . Colon polyps Neg Hx     Prior to Admission medications   Medication Sig Start Date End Date Taking? Authorizing Provider  albuterol (PROVENTIL HFA;VENTOLIN HFA) 108 (90 Base) MCG/ACT inhaler Inhale 1-2 puffs into the lungs every 6 (six) hours as needed for wheezing or shortness of breath (with Aerochamber).    [provider]  ALPRAZolam (XDuanne Moron1 MG tablet Take 0.5 tablets (0.5 mg total) by mouth 3 (three) times daily. anxiety Patient taking differently: Take 1 mg by mouth 3 (three) times daily. anxiety 11/08/14   OlDonita BrooksNP  amoxicillin-clavulanate (AUGMENTIN) 875-125 MG tablet Take 1 tablet by mouth 2 (two) times daily for 7 days. 08/21/17 08/28/17  JoMurlean IbaMD  aspirin EC 81 MG tablet Take 81 mg by mouth daily.    [provider]  Cholecalciferol (VITAMIN D-3) 5000 units TABS Take 5,000 Units by mouth daily.    [provider]  Cyanocobalamin (VITAMIN B-12 PO) Take 1 tablet by mouth daily.    [provider]  ferrous sulfate 325 (65 FE) MG tablet Take 1 tablet (325 mg total)  by mouth daily. 07/11/17   IdEvalee JeffersonPA-C  FLUoxetine (PROZAC) 40 MG capsule Take 80 mg by mouth daily.     [provider]  furosemide (LASIX) 20 MG tablet Take 20 mg by mouth daily as needed for fluid.    [provider]  hydrocortisone (ANUSOL-HC) 25 MG suppository Place 1 suppository (25 mg total) rectally 4 (four) times daily -  with meals and at bedtime. 08/21/17   Johnson, Clanford L, MD  metFORMIN (GLUCOPHAGE) 500 MG tablet Take 1 tablet (500 mg total) by mouth 2 (two) times daily with a meal. Patient taking differently: Take 1,000 mg by mouth 2 (two) times daily with a meal.  02/08/17   RaPattricia BossMD  ondansetron (ZOFRAN) 4 MG tablet Take 4 mg by mouth every 8 (eight) hours as needed for nausea or vomiting.  08/16/17   [provider]  pantoprazole (PROTONIX) 40 MG tablet Take 40 mg by mouth daily.    [provider]  potassium chloride  SA (K-DUR,KLOR-CON) 20 MEQ tablet Take 1 tablet by mouth daily as needed (when taken with Lasix for fluid).  12/04/10   [provider]  promethazine (PHENERGAN) 12.5 MG tablet Take 1 tablet (12.5 mg total) by mouth every 6 (six) hours as needed for nausea or vomiting. 08/21/17   Wynetta Emery, Clanford L, MD  rosuvastatin (CRESTOR) 10 MG tablet Take 10 mg by mouth daily. 04/15/17   [provider]  Spacer/Aero-Holding Chambers (AEROCHAMBER PLUS FLO-VU MEDIUM) MISC 1 each by Other route every 6 (six) hours as needed.    [provider]  traZODone (DESYREL) 50 MG tablet Take 100 mg by mouth at bedtime.     [provider]  vitamin C (ASCORBIC ACID) 500 MG tablet Take 500 mg by mouth daily.    [provider]    Physical Exam:  Constitutional: Older female who appears to be in some moderate respiratory distress Vitals:   08/24/17 0243 08/24/17 0245 08/24/17 0300 08/24/17 0315  BP:   (!) 112/47   Pulse: (!) 105 (!) 104 (!) 102   Resp: (!) 36 (!) 39 (!) 35   Temp:    98.2 F (36.8  C)  TempSrc:    Axillary  SpO2: 90% 93% 90%   Weight:      Height:       Eyes: PERRL, lids and conjunctivae normal ENMT: Mucous membranes are dry. Posterior pharynx clear of any exudate or lesions.Normal dentition.  Neck: normal, supple, no masses, no thyromegaly Respiratory: Tachypneic with decreased overall aeration, but no significant wheezes or rhonchi appreciated at this time. Cardiovascular: Regular rate and rhythm, no murmurs / rubs / gallops. No extremity edema. 2+ pedal pulses. No carotid bruits.  Abdomen: no tenderness, no masses palpated. No hepatosplenomegaly. Bowel sounds positive.  Musculoskeletal: no clubbing / cyanosis. No joint deformity upper and lower extremities. Good ROM, no contractures. Normal muscle tone.  Skin: no rashes, lesions, ulcers. No induration Neurologic: CN 2-12 grossly intact. Sensation intact, DTR normal. Strength 5/5 in all 4.  Psychiatric: Normal judgment and insight. Alert and oriented x 3. Normal mood.     Labs on Admission: I have personally reviewed following labs and imaging studies  CBC: Recent Labs  Lab 08/19/17 1355 08/20/17 0524 08/21/17 0430  WBC 11.3* 16.9* 8.9  NEUTROABS  --   --  6.1  HGB 8.9* 9.1* 8.9*  HCT 29.8* 30.0* 29.1*  MCV 90.9 90.4 90.1  PLT 453* 460* 482*   Basic Metabolic Panel: Recent Labs  Lab 08/19/17 1355 08/20/17 0524 08/21/17 0430  NA 140 139 139  K 3.3* 3.4* 3.8  CL 106 106 103  CO2 21* 21* 25  GLUCOSE 197* 189* 181*  BUN _0 CREATININE 0.80 0.75 0.74  CALCIUM 9.5 8.9 8.7*  MG  --   --  1.5*   GFR: Estimated Creatinine Clearance: 90.8 mL/min (by C-G formula based on SCr of 0.74 mg/dL). Liver Function Tests: Recent Labs  Lab 08/19/17 1355 08/20/17 0524 08/21/17 0430  AST _1 ALT _2 ALKPHOS 93 84 78  BILITOT 0.3 0.4 0.4  PROT 8.2* 7.6 7.5  ALBUMIN 3.7 3.3* 3.2*   No results for input(s): LIPASE, AMYLASE in the last 168 hours. No results for input(s): AMMONIA in the  last 168 hours. Coagulation Profile: No results for input(s): INR, PROTIME in the last 168 hours. Cardiac Enzymes: Recent Labs  Lab 08/19/17 2313 08/20/17 0524 08/20/17 1054  TROPONINI <0.03 <0.03 <  0.03   BNP (last 3 results) No results for input(s): PROBNP in the last 8760 hours. HbA1C: No results for input(s): HGBA1C in the last 72 hours. CBG: Recent Labs  Lab 08/20/17 1954 08/21/17 0039 08/21/17 0448 08/21/17 0731 08/21/17 1225  GLUCAP 229* 147* 171* 151* 103*   Lipid Profile: No results for input(s): CHOL, HDL, LDLCALC, TRIG, CHOLHDL, LDLDIRECT in the last 72 hours. Thyroid Function Tests: No results for input(s): TSH, T4TOTAL, FREET4, T3FREE, THYROIDAB in the last 72 hours. Anemia Panel: No results for input(s): VITAMINB12, FOLATE, FERRITIN, TIBC, IRON, RETICCTPCT in the last 72 hours. Urine analysis:    Component Value Date/Time   COLORURINE YELLOW 08/19/2017 [place] 08/19/2017 1854   LABSPEC 1.011 08/19/2017 1854   PHURINE 5.0 08/19/2017 1854   GLUCOSEU NEGATIVE 08/19/2017 1854   HGBUR NEGATIVE 08/19/2017 [place] NEGATIVE 08/19/2017 1854   KETONESUR NEGATIVE 08/19/2017 1854   PROTEINUR NEGATIVE 08/19/2017 1854   UROBILINOGEN 0.2 10/28/2014 2330   NITRITE POSITIVE (A) 08/19/2017 1854   LEUKOCYTESUR SMALL (A) 08/19/2017 1854   Sepsis Labs: Recent Results (from the past 240 hour(s))  Culture, Urine     Status: Abnormal   Collection Time: 08/19/17  6:54 PM  Result Value Ref Range Status   Specimen Description   Final    URINE, CLEAN CATCH Performed at [facility], [address], [place], [place] [address]    Special Requests   Final    NONE Performed at [facility], [address] [place], [place] [address]    Culture >=100,000 COLONIES/mL KLEBSIELLA PNEUMONIAE (A)  Final   Report Status 08/22/2017 FINAL  Final   Organism ID, Bacteria KLEBSIELLA PNEUMONIAE (A)  Final      Susceptibility   Klebsiella pneumoniae - MIC*     AMPICILLIN >=32 RESISTANT Resistant     CEFAZOLIN <=4 SENSITIVE Sensitive     CEFTRIAXONE <=1 SENSITIVE Sensitive     CIPROFLOXACIN <=0.25 SENSITIVE Sensitive     GENTAMICIN <=1 SENSITIVE Sensitive     IMIPENEM <=0.25 SENSITIVE Sensitive     NITROFURANTOIN 64 INTERMEDIATE Intermediate     TRIMETH/SULFA <=20 SENSITIVE Sensitive     AMPICILLIN/SULBACTAM 16 INTERMEDIATE Intermediate     PIP/TAZO <=4 SENSITIVE Sensitive     Extended ESBL NEGATIVE Sensitive     * >=100,000 COLONIES/mL KLEBSIELLA PNEUMONIAE     Radiological Exams on Admission: No results found.  EKG: Independently reviewed.   Assessment/Plan Acute hypoxic respiratory failure, Sepsis 2/2 healthcare associated pneumonia: Patient initially presented and met sepsis criteria at the outside facility. Lactic acid was initially noted to be trending upward from 2.5-3.6.  Given patient's clinical picture symptoms were thought more likely related to infection and less likely CHF. - Admit to stepdown bed  - N.p.o. - Continuous pulse oximetry - BiPAP as needed - Sepsis protocol initiated - Check stat CBC, CMP, lactic acid - Check VQ scan, Question PE - Continue empiric antibiotics of vancomycin and Zosyn - DuoNeb's 4 times daily and prn  - Consult to pulmonary critical care  Anemia of chronic blood loss, esophagitis, hemorrhoids: Patient had recent EGD showing esophagitis and does have history of hemorrhoids and declined evaluation for hemorrhoidectomy.  Patient reports having hemoptysis, but question if this is secondary to previously noted esophagitis.  Hemoglobin noted to be 9. -Type and screen for possible need of blood products - Follow-up repeat CBC  Diastolic CHF: Last EF noted to be 65-70%.  With grade 1 diastolic dysfunction on  2/26. - Strict intake and output and - daily weights  Elevated d-dimer: Patient has contrast allergy but an elevated d-dimer.  Reports hemoptysis question possibility of pulmonary embolus. -  Check VQ scan  Diabetes mellitus type 2 - Hypoglycemic protocol - CBGs every 4 hours with moderate SSI  GERD prophylaxis: Protonix IV  DVT prophylaxis: lovenox  Code Status: Full Family Communication: Discussed plan of care with the patient family present at bedside Disposition Plan: TBD Consults called: [facility] M Admission status: Inpatient  Norval Morton MD [facility] Pager [phone]   If 7PM-7AM, please contact night-coverage [email or web address] Password [email or web address]  08/24/2017, 2:42 AM

## 2017-08-24 NOTE — Progress Notes (Signed)
PULMONARY / CRITICAL CARE MEDICINE   Name: Holly Figueroa MRN: [id number] DOB: 08-24-1965    ADMISSION DATE:  08/24/2017   HISTORY OF PRESENT ILLNESS:        This 52 y.o. Caucasian female smoker is seen in consultation at the request of Dr. Abelina Bachelor for recommendations on further evaluation and management of respiratory distress.  The patient is seen in the ICU where she is currently on BiPAP support.  She is awake, alert and breathing comfortably on BiPAP, albeit with a respiratory rate in the 20s and 30s.  She claims that she is breathing comfortably and is able to provide her own clinical history.  The patient reports that she was discharged 2-3 days ago from [facility], where she underwent treatment for pneumonia.  She was discharged on antibiotic therapy but states that she has not been getting better.  Yesterday, while cleaning her house, the patient became severely dyspneic, more than normal.  Most concerning was that she did not recover despite sitting down for rest.  She presented to [facility] emergency department with dyspnea, lower abdominal pain and headache.  She was observed to have labored respirations.  She was placed on supplemental oxygen at 4-6 LPM via nasal cannula with SPO2 90-91%.  However, she subsequently was found to have an SPO2 of 72% and, consequently, was placed on BiPAP.  She promptly improved her oxygenation    PAST MEDICAL HISTORY :  She  has a past medical history of Acute respiratory failure ([place]), Anemia (08-2008), Anxiety, Benzodiazepine dependence ([place]), Cervical cancer ([place]), CHF (congestive heart failure) ([place]), Depression, Diabetes mellitus without complication ([place]), [id number](478.2), Hypokalemia, Legionella pneumonia ([place]), Leukocytosis, unspecified, Mitral stenosis, Panic attacks, and Tobacco abuse.  PAST SURGICAL HISTORY: She  has a past surgical history that includes Laser ablation of the cervix; Mole removal; Nasal sinus surgery;  Cervical conization w/bx (2000); Cholecystectomy (06/26/2012); cardiac valve repair (2011); Colonoscopy with propofol (N/A, 06/12/2017); Esophagogastroduodenoscopy (egd) with propofol (N/A, 06/12/2017); maloney dilation (06/12/2017); biopsy (06/12/2017); and Givens capsule study (N/A, 07/19/2017).  Allergies  Allergen Reactions  . Iodinated Diagnostic Agents Anaphylaxis  . Aspirin Other (See Comments)    "Possible blood in stool" the 316m. Can take 83m  . Ibuprofen Other (See Comments)    "Possible blood in stool"  . Sulfa Antibiotics Hives  . Zofran [Ondansetron Hcl]     Bad headaches  . Dilaudid [Hydromorphone Hcl] Itching and Palpitations  . Hydrocodone Palpitations    No current facility-administered medications on file prior to encounter.    Current Outpatient Medications on File Prior to Encounter  Medication Sig  . albuterol (PROVENTIL HFA;VENTOLIN HFA) 108 (90 Base) MCG/ACT inhaler Inhale 1-2 puffs into the lungs every 6 (six) hours as needed for wheezing or shortness of breath (with Aerochamber).  . ALPRAZolam (XANAX) 1 MG tablet Take 0.5 tablets (0.5 mg total) by mouth 3 (three) times daily. anxiety (Patient taking differently: Take 1 mg by mouth 3 (three) times daily. anxiety)  . aspirin EC 81 MG tablet Take 81 mg by mouth daily after breakfast.   . Cholecalciferol (VITAMIN D-3) 5000 units TABS Take 5,000 Units by mouth daily after breakfast.   . Cyanocobalamin (VITAMIN B-12 PO) Take 1 tablet by mouth daily after breakfast.   . FLUoxetine (PROZAC) 40 MG capsule Take 80 mg by mouth daily after breakfast.   . furosemide (LASIX) 20 MG tablet Take 20 mg by mouth daily as needed for fluid.  . hydrocortisone (ANUSOL-HC) 25 MG  suppository Place 1 suppository (25 mg total) rectally 4 (four) times daily -  with meals and at bedtime. (Patient taking differently: Place 25 mg rectally 2 (two) times daily as needed for hemorrhoids or anal itching. )  . metFORMIN (GLUCOPHAGE) 500 MG tablet  Take 1 tablet (500 mg total) by mouth 2 (two) times daily with a meal. (Patient taking differently: Take 1,000 mg by mouth 2 (two) times daily with a meal. )  . pantoprazole (PROTONIX) 40 MG tablet Take 40 mg by mouth daily after breakfast.   . potassium chloride SA (K-DUR,KLOR-CON) 20 MEQ tablet Take 1 tablet by mouth daily as needed (when taken with Lasix for fluid).   . promethazine (PHENERGAN) 12.5 MG tablet Take 1 tablet (12.5 mg total) by mouth every 6 (six) hours as needed for nausea or vomiting.  . rosuvastatin (CRESTOR) 10 MG tablet Take 10 mg by mouth daily after breakfast.   . Spacer/Aero-Holding Chambers (AEROCHAMBER PLUS FLO-VU MEDIUM) MISC 1 each by Other route every 6 (six) hours as needed (sob,wheezing).   . traZODone (DESYREL) 50 MG tablet Take 100 mg by mouth at bedtime.   . vitamin C (ASCORBIC ACID) 500 MG tablet Take 500 mg by mouth daily after breakfast.   . amoxicillin-clavulanate (AUGMENTIN) 875-125 MG tablet Take 1 tablet by mouth 2 (two) times daily for 7 days. (Patient not taking: Reported on 08/24/2017)  . ferrous sulfate 325 (65 FE) MG tablet Take 1 tablet (325 mg total) by mouth daily. (Patient not taking: Reported on 08/24/2017)    FAMILY HISTORY:  Her indicated that the status of her mother is unknown. She indicated that the status of her father is unknown. She indicated that the status of her brother is unknown. She indicated that the status of her maternal grandmother is unknown. She indicated that the status of her neg hx is unknown.   SOCIAL HISTORY: She  reports that she has been smoking cigarettes.  She has a 23.00 pack-year smoking history. she has never used smokeless tobacco. She reports that she does not drink alcohol or use drugs.    SUBJECTIVE:       Shefeels that she is breathing more comfortably this morning.  She tells me that her acute shortness of breath came on insidiously.  Was associated with some productive cough and pleuritic chest pain at  home.  VITAL SIGNS: BP (!) 109/48   Pulse 90   Temp 98 F (36.7 C) (Axillary)   Resp (!) 40   Ht _0  (1.676 m)   Wt 180 lb 5.4 oz (81.8 kg)   SpO2 95%   BMI 29.11 kg/m   HEMODYNAMICS:    VENTILATOR SETTINGS: FiO2 (%):  [100 %] 100 %  INTAKE / OUTPUT: I/O last 3 completed shifts: In: 250 [IV Piggyback:250] Out: 100 [Urine:100]  PHYSICAL EXAMINATION: General: Middle-aged female who is somewhat tachypneic but not labored on BiPAP. Neuro: She is awake and answering questions but has some difficulty in communicating with the BiPAP mask in place Cardiovascular: S1 and S2 are regular without murmur rub or gallop. Lungs: She is tachypneic but not labored.  There is symmetric air movement scattered rhonchi and no wheezes Abdomen: The abdomen is obese and soft without any organomegaly masses or tenderness Musculoskeletal: There is no dependent edema   LABS:  BMET Recent Labs  Lab 08/20/17 0524 08/21/17 0430 08/24/17 0511  NA 139 139 138  K 3.4* 3.8 3.9  CL 106 103 106  CO2 21* 25 18*  BUN _0 CREATININE 0.75 0.74 0.82  GLUCOSE 189* 181* 273*    Electrolytes Recent Labs  Lab 08/20/17 0524 08/21/17 0430 08/24/17 0511 08/24/17 0530  CALCIUM 8.9 8.7* 8.5*  --   MG  --  1.5*  --  1.7    CBC Recent Labs  Lab 08/20/17 0524 08/21/17 0430 08/24/17 0511  WBC 16.9* 8.9 24.9*  HGB 9.1* 8.9* 8.6*  HCT 30.0* 29.1* 28.2*  PLT 460* 421* 472*    Coag's Recent Labs  Lab 08/24/17 0511  APTT 37*  INR 1.23    Sepsis Markers Recent Labs  Lab 08/24/17 0511  LATICACIDVEN 2.8*  PROCALCITON 6.34    ABG Recent Labs  Lab 08/24/17 0620  PHART 7.348*  PCO2ART 41.4  PO2ART 113*    Liver Enzymes Recent Labs  Lab 08/20/17 0524 08/21/17 0430 08/24/17 0511  AST 27 21 48*  ALT 25 21 42  ALKPHOS 84 78 76  BILITOT 0.4 0.4 0.7  ALBUMIN 3.3* 3.2* 3.1*    Cardiac Enzymes Recent Labs  Lab 08/20/17 0524 08/20/17 1054 08/24/17 0511  TROPONINI  <0.03 <0.03 <0.03    Glucose Recent Labs  Lab 08/20/17 1701 08/20/17 1954 08/21/17 0039 08/21/17 0448 08/21/17 0731 08/21/17 1225  GLUCAP 182* 229* 147* 171* 151* 103*    Imaging No results found.    ANTIBIOTICS: Vaanco and Zosyn started 3/2    DISCUSSION:      This is a 52 year old smoker who was recently discharged from another hospital after being treated for pneumonia.  She developed insidiously progressive and severe shortness of breath.  On admission here she has an elevated white count and elevated lactate and elevated pro-calcitonin.  She is being treated for a healthcare associated pneumonia.  ASSESSMENT / PLAN:  PULMONARY A: Continue vancomycin and Zosyn for presumed healthcare associated pneumonia.  She is currently not bronchospastic, continue duo nebs.  I am concerned by her high minute volume of approximately 15 L/min,she will need to be observed for fatigue even on BiPAP.   CARDIOVASCULAR A: No active issues.  With overt infiltrates on her chest x-ray and elevated pro-calcitonin, white count, and lactic acid and a history of insidious progression, my suspicion of PE contributing to her dyspnea is very limited    GASTROINTESTINAL A: Prophylaxis is with Protonix  ENDOCRINE A: Diabetic control needs to be tightened, I have added a dose of Lantus to her sliding scale insulin   Lars Masson, MD Pulmonary and [place] Pager: [phone]  08/24/2017, 8:29 AM

## 2017-08-24 NOTE — Consult Note (Signed)
Initial Pulmonary/Critical Care Consultation  Patient Name: Holly Figueroa MRN: [id number] DOB: 08-13-1965    ADMISSION DATE:  08/24/2017 CONSULTATION DATE:  08/24/2017  REFERRING MD:  Dr. Sharyon Medicus  REASON FOR CONSULTATION:  Respiratory distress   HISTORY OF PRESENT ILLNESS  This 52 y.o. Caucasian female smoker is seen in consultation at the request of Dr. Abelina Bachelor for recommendations on further evaluation and management of respiratory distress.  The patient is seen in the ICU where she is currently on BiPAP support.  She is awake, alert and breathing comfortably on BiPAP, albeit with a respiratory rate in the 20s and 30s.  She claims that she is breathing comfortably and is able to provide her own clinical history.  The patient reports that she was discharged 2-3 days ago from [facility], where she underwent treatment for pneumonia.  She was discharged on antibiotic therapy but states that she has not been getting better.  Yesterday, while cleaning her house, the patient became severely dyspneic, more than normal.  Most concerning was that she did not recover despite sitting down for rest.  She presented to [facility] emergency department with dyspnea, lower abdominal pain and headache.  She was observed to have labored respirations.  She was placed on supplemental oxygen at 4-6 LPM via nasal cannula with SPO2 90-91%.  However, she subsequently was found to have an SPO2 of 72% and, consequently, was placed on BiPAP.  She promptly improved her oxygenation.  Although not stipulated in her supporting clinical documentation, the patient reports that she was told she needed a CTA of the chest to rule out pulmonary embolism; however, she is allergic (anaphylactic shock) to IV contrast.  Consequently, she was advised that a V/Q scan was needed, perhaps explaining the need to transfer to this facility.  She also reports that she recently has been diagnosed with anemia of unknown  etiology.  She does endorse abnormal blood loss (hemoptysis, hematemesis, melanotic stools and hematuria); however, there is no current evidence of active bleeding.  At the time of clinical interview, there is no current clinical data is available.   REVIEW OF SYSTEMS Constitutional: Febrile.  Fatigue.  No chills.  No weight loss. No night sweats. HEENT: Headache.  No dysphagia, sore throat, otalgia, nasal congestion, PND CV:  No chest pain.  No PND, swelling in lower extremities.  Tachycardia.   GI:  Abdominal pain.  Nausea (aerophagia?).  No emesis, diarrhea, change in bowel pattern, anorexia Resp: Nurse reports that she did see 1 bloody sputum, although unsure whether this was bloody from epistaxis or true hemoptysis.  Dyspnea at rest.  Cough productive of yellow and green sputum.  Wheezing. GU: Hematuria.  No urgency or frequency.  No flank pain. MS:  No joint pain or swelling. No myalgias,  No decreased range of motion.  Psych:  No change in mood or affect. No memory loss. Skin: no rash or lesions.   PAST MEDICAL/SURGICAL/SOCIAL/FAMILY HISTORIES   Past Medical History:  Diagnosis Date  . Acute respiratory failure ([place])   . Anemia 08-2008   Blood transfusion  . Anxiety   . Benzodiazepine dependence ([place])   . Cervical cancer ([place])   . CHF (congestive heart failure) ([place])   . Depression   . Diabetes mellitus without complication ([place])   . Headache(784.0)    migraines  . Hypokalemia   . Legionella pneumonia ([place])   . Leukocytosis, unspecified   . Mitral stenosis   . Panic attacks   .  Tobacco abuse     Past Surgical History:  Procedure Laterality Date  . BIOPSY  06/12/2017   Procedure: BIOPSY;  Surgeon: Daneil Dolin, MD;  Location: [facility] END[facility] SUITE;  Service: Gastroenterology;;  esophagus  . cardiac valve repair  2011   stretched mitral valve  . CERVICAL CONIZATION W/BX  2000  . CHOLECYSTECTOMY  06/26/2012   Procedure: LAPAROSCOPIC CHOLECYSTECTOMY WITH INTRAOPERATIVE  CHOLANGIOGRAM;  Surgeon: Ralene Ok, MD;  Location: [facility] ORS;  Service: General;  Laterality: N/A;  . COLONOSCOPY WITH PROPOFOL N/A 06/12/2017   Normal colon, normal TI, Grade I internal hemorrhoids  . ESOPHAGOGASTRODUODENOSCOPY (EGD) WITH PROPOFOL N/A 06/12/2017   esophagitis, possibly chemical or pill-induced, s/p dilation. small hiatal hernia, normal duodenal bulb and D 2  . GIVENS CAPSULE STUDY N/A 07/19/2017   incomplete capsule study as did not reach cecum.   Marland Kitchen LASER ABLATION OF THE CERVIX    . MALONEY DILATION  06/12/2017   Procedure: MALONEY DILATION;  Surgeon: Daneil Dolin, MD;  Location: [facility];  Service: Gastroenterology;;  . MOLE REMOVAL    . NASAL SINUS SURGERY      Social History   Tobacco Use  . Smoking status: Current Every Day Smoker    Packs/day: 1.00    Years: 23.00    Pack years: 23.00    Types: Cigarettes    Last attempt to quit: 05/06/2017    Years since quitting: 0.3  . Smokeless tobacco: Never Used  Substance Use Topics  . Alcohol use: No    Alcohol/week: 0.0 oz    Family History  Problem Relation Age of Onset  . Heart disease Father   . COPD Father   . Heart failure Maternal Grandmother   . Stroke Mother   . Colon cancer Brother 21       deceased   . Colon polyps Neg Hx      Allergies  Allergen Reactions  . Iodinated Diagnostic Agents Anaphylaxis  . Aspirin Other (See Comments)    "Possible blood in stool" the 337m. Can take 873m  . Ibuprofen Other (See Comments)    "Possible blood in stool"  . Sulfa Antibiotics Hives  . Zofran [Ondansetron Hcl]     Bad headaches  . Dilaudid [Hydromorphone Hcl] Itching and Palpitations  . Hydrocodone Palpitations     Prior to Admission medications   Medication Sig Start Date End Date Taking? Authorizing Provider  albuterol (PROVENTIL HFA;VENTOLIN HFA) 108 (90 Base) MCG/ACT inhaler Inhale 1-2 puffs into the lungs every 6 (six) hours as needed for wheezing or shortness of breath (with  Aerochamber).    [provider]  ALPRAZolam (XDuanne Moron1 MG tablet Take 0.5 tablets (0.5 mg total) by mouth 3 (three) times daily. anxiety Patient taking differently: Take 1 mg by mouth 3 (three) times daily. anxiety 11/08/14   OlDonita BrooksNP  amoxicillin-clavulanate (AUGMENTIN) 875-125 MG tablet Take 1 tablet by mouth 2 (two) times daily for 7 days. 08/21/17 08/28/17  JoMurlean IbaMD  aspirin EC 81 MG tablet Take 81 mg by mouth daily.    [provider]  Cholecalciferol (VITAMIN D-3) 5000 units TABS Take 5,000 Units by mouth daily.    [provider]  Cyanocobalamin (VITAMIN B-12 PO) Take 1 tablet by mouth daily.    [provider]  ferrous sulfate 325 (65 FE) MG tablet Take 1 tablet (325 mg total) by mouth daily. 07/11/17   IdEvalee JeffersonPA-C  FLUoxetine (PROZAC) 40 MG capsule Take  80 mg by mouth daily.     [provider]  furosemide (LASIX) 20 MG tablet Take 20 mg by mouth daily as needed for fluid.    [provider]  hydrocortisone (ANUSOL-HC) 25 MG suppository Place 1 suppository (25 mg total) rectally 4 (four) times daily -  with meals and at bedtime. 08/21/17   Johnson, Clanford L, MD  metFORMIN (GLUCOPHAGE) 500 MG tablet Take 1 tablet (500 mg total) by mouth 2 (two) times daily with a meal. Patient taking differently: Take 1,000 mg by mouth 2 (two) times daily with a meal.  02/08/17   Pattricia Boss, MD  ondansetron (ZOFRAN) 4 MG tablet Take 4 mg by mouth every 8 (eight) hours as needed for nausea or vomiting.  08/16/17   [provider]  pantoprazole (PROTONIX) 40 MG tablet Take 40 mg by mouth daily.    [provider]  potassium chloride SA (K-DUR,KLOR-CON) 20 MEQ tablet Take 1 tablet by mouth daily as needed (when taken with Lasix for fluid).  12/04/10   [provider]  promethazine (PHENERGAN) 12.5 MG tablet Take 1 tablet (12.5 mg total) by mouth every 6 (six) hours as needed for nausea or vomiting.  08/21/17   Wynetta Emery, Clanford L, MD  rosuvastatin (CRESTOR) 10 MG tablet Take 10 mg by mouth daily. 04/15/17   [provider]  Spacer/Aero-Holding Chambers (AEROCHAMBER PLUS FLO-VU MEDIUM) MISC 1 each by Other route every 6 (six) hours as needed.    [provider]  traZODone (DESYREL) 50 MG tablet Take 100 mg by mouth at bedtime.     [provider]  vitamin C (ASCORBIC ACID) 500 MG tablet Take 500 mg by mouth daily.    [provider]    Current Facility-Administered Medications  Medication Dose Route Frequency Provider Last Rate Last Dose  . acetaminophen (TYLENOL) tablet 650 mg  650 mg Oral Q6H PRN Norval Morton, MD       Or  . acetaminophen (TYLENOL) suppository 650 mg  650 mg Rectal Q6H PRN Smith, Rondell A, MD      . enoxaparin (LOVENOX) injection 40 mg  40 mg Subcutaneous Q24H Smith, Rondell A, MD      . ipratropium-albuterol (DUONEB) 0.5-2.5 (3) MG/3ML nebulizer solution 3 mL  3 mL Nebulization Q2H PRN Smith, Rondell A, MD      . ipratropium-albuterol (DUONEB) 0.5-2.5 (3) MG/3ML nebulizer solution 3 mL  3 mL Nebulization QID Smith, Rondell A, MD      . piperacillin-tazobactam (ZOSYN) IVPB 3.375 g  3.375 g Intravenous Q8H Smith, Rondell A, MD      . vancomycin (VANCOCIN) IVPB 1000 mg/200 mL premix  1,000 mg Intravenous Once Fuller Plan A, MD 200 mL/hr at 08/24/17 0417 1,000 mg at 08/24/17 0417  . vancomycin (VANCOCIN) IVPB 750 mg/150 ml premix  750 mg Intravenous Q12H Smith, Rondell A, MD         VITAL SIGNS: BP (!) 112/47   Pulse (!) 102   Temp 98.2 F (36.8 C) (Axillary)   Resp (!) 35   Ht _0  (1.676 m)   Wt 81.8 kg (180 lb 5.4 oz)   SpO2 90%   BMI 29.11 kg/m   INTAKE / OUTPUT: No intake/output data recorded.  PHYSICAL EXAMINATION: GENERAL: alert, oriented to person, place, and time. Pleasant. Well-developed. Cooperative.  Tolerating BiPAP. HEAD: normocephalic, atraumatic EYE: PERRLA, EOM intact, no scleral icterus, no  pallor. NOSE: nares are patent. No polyps. No exudate. THROAT/ORAL CAVITY: Normal  dentition. No oral thrush. No exudate. Mucous membranes are dry. No tonsillar enlargement.  Mallampati airway classification not feasible at this time due to BiPAP dependence. NECK: supple, no thyromegaly, no JVD, no lymphadenopathy. Trachea midline. CHEST/LUNG: symmetric in development and expansion. Good air entry.  Bilateral rales and rhonchi. HEART: Regular S1 and S2 without murmur, rub or gallop.  Tachycardic ABDOMEN: soft, nontender, nondistended. Normoactive bowel sounds. No rebound. No guarding. No hepatosplenomegaly. EXTREMITIES: Edema: none. No cyanosis. No clubbing. 2+ DP pulses LYMPHATIC: no cervical/axiallary/inguinal lymph nodes appreciated MUSCULOSKELETAL:  no joint tenderness, deformity or swelling. SKIN:  No rash or lesion. NEUROLOGIC: Doll's eyes intact. Corneal reflex intact. Spontaneous respirations intact. Cranial nerves II-XII are grossly symmetric and physiologic. Babinski absent. No sensory deficit. Motor: 5/5 @ RUE, 5/5 @ LUE, 5/5 @ RLL,  5/5 @ LLL.  DTR: 2+ @ R biceps, 2+ @ L biceps, 2+ @ R patellar,  2+ @ L patellar. No cerebellar signs. Gait was not assessed.   LABS:  ABG No results for input(s): PHART, PCO2ART, PO2ART in the last 168 hours.  Cardiac Enzymes Recent Labs  Lab 08/19/17 2313 08/20/17 0524 08/20/17 1054  TROPONINI <0.03 <0.03 <3.49    BASIC METABOLIC PROFILE Recent Labs  Lab 08/19/17 1355 08/20/17 0524 08/21/17 0430  NA 140 139 139  K 3.3* 3.4* 3.8  CL 106 106 103  CO2 21* 21* 25  BUN _0 CREATININE 0.80 0.75 0.74  GLUCOSE 197* 189* 181*  CALCIUM 9.5 8.9 8.7*  MG  --   --  1.5*    Glucose Recent Labs  Lab 08/20/17 1701 08/20/17 1954 08/21/17 0039 08/21/17 0448 08/21/17 0731 08/21/17 1225  GLUCAP 182* 229* 147* 171* 151* 103*    Liver Enzymes Recent Labs  Lab 08/19/17 1355 08/20/17 0524 08/21/17 0430  AST _1 ALT _2 ALKPHOS 93 84 78  BILITOT 0.3 0.4 0.4  ALBUMIN 3.7 3.3* 3.2*    CBC Recent Labs  Lab 08/19/17 1355 08/20/17 0524 08/21/17 0430  WBC 11.3* 16.9* 8.9  HGB 8.9* 9.1* 8.9*  HCT 29.8* 30.0* 29.1*  PLT 453* 460* 421*    COAGULATION STUDIES No results for input(s): APTT, INR in the last 168 hours.  SEPSIS MARKERS No results for input(s): LATICACIDVEN, PROCALCITON, O2SATVEN in the last 168 hours.  CULTURES: Results for orders placed or performed during the hospital encounter of 08/19/17  Culture, Urine     Status: Abnormal   Collection Time: 08/19/17  6:54 PM  Result Value Ref Range Status   Specimen Description   Final    URINE, CLEAN CATCH Performed at [facility], [address] [place], [place] [address]    Special Requests   Final    NONE Performed at [facility], [address] [place], [place] [address]    Culture >=100,000 COLONIES/mL KLEBSIELLA PNEUMONIAE (A)  Final   Report Status 08/22/2017 FINAL  Final   Organism ID, Bacteria KLEBSIELLA PNEUMONIAE (A)  Final      Susceptibility   Klebsiella pneumoniae - MIC*    AMPICILLIN >=32 RESISTANT Resistant     CEFAZOLIN <=4 SENSITIVE Sensitive     CEFTRIAXONE <=1 SENSITIVE Sensitive     CIPROFLOXACIN <=0.25 SENSITIVE Sensitive     GENTAMICIN <=1 SENSITIVE Sensitive     IMIPENEM <=0.25 SENSITIVE Sensitive     NITROFURANTOIN 64 INTERMEDIATE Intermediate     TRIMETH/SULFA <=20 SENSITIVE Sensitive     AMPICILLIN/SULBACTAM 16 INTERMEDIATE Intermediate  PIP/TAZO <=4 SENSITIVE Sensitive     Extended ESBL NEGATIVE Sensitive     * >=100,000 COLONIES/mL KLEBSIELLA PNEUMONIAE     IMAGING: No results found.   ASSESSMENT / PLAN: Active Problems:   Acute respiratory failure (HCC)   PULMONARY  Acute hypoxemic respiratory failure, suspected to be secondary to healthcare associated pneumonia Currently on Zosyn.  Add vancomycin. Adjust BiPAP to 12/8.  Wean FiO2 as tolerated. Check ABG in 1 hour. Ideally, this  patient would transition to a heated, humidified, high flow cannula to encourage airway clearance. Precedex. Chest x-ray.   Check pro-calcitonin.  RENAL  Lactic acidosis (lactate 2.5 at outside hospital)  Hypomagnesemia Repeat magnesium level.  Replete as needed. Avoid proton pump inhibitors for now.  HEMATOLOGIC  Normal acidic anemia, unknown etiology  Leukocytosis (WBC 17 at outside hospital) Monitor hemoglobin. Monitor for abnormal blood loss.  INFECTIOUS  Sepsis  Klebsiella urinary tract infection Currently on Zosyn.  ENDOCRINE  Type 2 diabetes without complications Defer management to the primary team  NEUROLOGIC  Benzodiazepine dependence  Panic attacks   FAMILY  - Updates: Husband at the bedside updated  - Inter-disciplinary family meet or Palliative Care meeting due by: 08/31/2017   Renee Pain, MD Board Certified by the ABIM, [place] Pager: [phone]  08/24/2017, 5:14 AM

## 2017-08-24 NOTE — Progress Notes (Signed)
CRITICAL VALUE ALERT  Critical Value:  Lactic acid 2.8  Date & Time Notied:  08-24-17 0645  Provider Notified: [place]  Orders Received/Actions taken:

## 2017-08-25 ENCOUNTER — Inpatient Hospital Stay (HOSPITAL_COMMUNITY): Payer: Medicare PPO

## 2017-08-25 ENCOUNTER — Encounter (HOSPITAL_COMMUNITY): Payer: Self-pay

## 2017-08-25 DIAGNOSIS — R609 Edema, unspecified: Secondary | ICD-10-CM

## 2017-08-25 DIAGNOSIS — J80 Acute respiratory distress syndrome: Secondary | ICD-10-CM

## 2017-08-25 DIAGNOSIS — I340 Nonrheumatic mitral (valve) insufficiency: Secondary | ICD-10-CM

## 2017-08-25 DIAGNOSIS — J9601 Acute respiratory failure with hypoxia: Secondary | ICD-10-CM

## 2017-08-25 DIAGNOSIS — R6521 Severe sepsis with septic shock: Secondary | ICD-10-CM

## 2017-08-25 LAB — GLUCOSE, CAPILLARY
GLUCOSE-CAPILLARY: 137 mg/dL — AB (ref 65–99)
GLUCOSE-CAPILLARY: 145 mg/dL — AB (ref 65–99)
Glucose-Capillary: 110 mg/dL — ABNORMAL HIGH (ref 65–99)
Glucose-Capillary: 172 mg/dL — ABNORMAL HIGH (ref 65–99)
Glucose-Capillary: 137 mg/dL — ABNORMAL HIGH (ref 65–99)
Glucose-Capillary: 125 mg/dL — ABNORMAL HIGH (ref 65–99)

## 2017-08-25 LAB — CBC WITH DIFFERENTIAL/PLATELET
BASOS ABS: 0 10*3/uL (ref 0.0–0.1)
Basophils Absolute: 0 10*3/uL (ref 0.0–0.1)
Basophils Relative: 0 %
Basophils Relative: 0 %
Eosinophils Absolute: 0 10*3/uL (ref 0.0–0.7)
Eosinophils Absolute: 0 10*3/uL (ref 0.0–0.7)
Eosinophils Relative: 0 %
Eosinophils Relative: 0 %
HCT: 23.7 % — ABNORMAL LOW (ref 36.0–46.0)
HEMATOCRIT: 25.1 % — AB (ref 36.0–46.0)
HEMOGLOBIN: 7.1 g/dL — AB (ref 12.0–15.0)
HEMOGLOBIN: 7.5 g/dL — AB (ref 12.0–15.0)
LYMPHS ABS: 0.5 10*3/uL — AB (ref 0.7–4.0)
LYMPHS PCT: 3 %
LYMPHS PCT: 4 %
Lymphs Abs: 0.6 10*3/uL — ABNORMAL LOW (ref 0.7–4.0)
MCH: 27.2 pg (ref 26.0–34.0)
MCH: 27.4 pg (ref 26.0–34.0)
MCHC: 30 g/dL (ref 30.0–36.0)
MCHC: 29.9 g/dL — ABNORMAL LOW (ref 30.0–36.0)
MCV: 90.8 fL (ref 78.0–100.0)
MCV: 91.6 fL (ref 78.0–100.0)
MONOS PCT: 2 %
Monocytes Absolute: 0.3 10*3/uL (ref 0.1–1.0)
Monocytes Absolute: 0.3 10*3/uL (ref 0.1–1.0)
Monocytes Relative: 2 %
NEUTROS PCT: 95 %
NEUTROS PCT: 94 %
Neutro Abs: 17.8 10*3/uL — ABNORMAL HIGH (ref 1.7–7.7)
Neutro Abs: 13.7 10*3/uL — ABNORMAL HIGH (ref 1.7–7.7)
Platelets: 309 10*3/uL (ref 150–400)
Platelets: 370 10*3/uL (ref 150–400)
RBC: 2.61 MIL/uL — AB (ref 3.87–5.11)
RBC: 2.74 MIL/uL — AB (ref 3.87–5.11)
RDW: 15.6 % — ABNORMAL HIGH (ref 11.5–15.5)
RDW: 15.5 % (ref 11.5–15.5)
WBC: 18.7 10*3/uL — AB (ref 4.0–10.5)
WBC: 14.6 10*3/uL — ABNORMAL HIGH (ref 4.0–10.5)

## 2017-08-25 LAB — BLOOD GAS, ARTERIAL
Acid-base deficit: 6.2 mmol/L — ABNORMAL HIGH (ref 0.0–2.0)
Bicarbonate: 21 mmol/L (ref 20.0–28.0)
DRAWN BY: 331471
FIO2: 100
O2 SAT: 99.2 %
PATIENT TEMPERATURE: 103.1
PCO2 ART: 59.1 mmHg — AB (ref 32.0–48.0)
PEEP: 5 cmH2O
PH ART: 7.192 — AB (ref 7.350–7.450)
PO2 ART: 210 mmHg — AB (ref 83.0–108.0)
RATE: 29 resp/min
VT: 360 mL

## 2017-08-25 LAB — RESPIRATORY PANEL BY PCR
Adenovirus: NOT DETECTED
BORDETELLA PERTUSSIS-RVPCR: NOT DETECTED
CHLAMYDOPHILA PNEUMONIAE-RVPPCR: NOT DETECTED
Coronavirus 229E: NOT DETECTED
Coronavirus HKU1: NOT DETECTED
Coronavirus NL63: NOT DETECTED
Coronavirus OC43: NOT DETECTED
INFLUENZA A-RVPPCR: NOT DETECTED
INFLUENZA B-RVPPCR: DETECTED — AB
MYCOPLASMA PNEUMONIAE-RVPPCR: NOT DETECTED
Metapneumovirus: NOT DETECTED
PARAINFLUENZA VIRUS 3-RVPPCR: NOT DETECTED
PARAINFLUENZA VIRUS 4-RVPPCR: NOT DETECTED
Parainfluenza Virus 1: NOT DETECTED
Parainfluenza Virus 2: NOT DETECTED
RHINOVIRUS / ENTEROVIRUS - RVPPCR: NOT DETECTED
Respiratory Syncytial Virus: NOT DETECTED

## 2017-08-25 LAB — ECHOCARDIOGRAM COMPLETE
HEIGHTINCHES: 66 in
WEIGHTICAEL: 2959.46 [oz_av]

## 2017-08-25 LAB — BASIC METABOLIC PANEL
ANION GAP: 9 (ref 5–15)
BUN: 16 mg/dL (ref 6–20)
CHLORIDE: 110 mmol/L (ref 101–111)
CO2: 20 mmol/L — ABNORMAL LOW (ref 22–32)
Calcium: 7.8 mg/dL — ABNORMAL LOW (ref 8.9–10.3)
Creatinine, Ser: 0.86 mg/dL (ref 0.44–1.00)
GFR calc Af Amer: >60 mL/min (ref >60)
Glucose, Bld: 173 mg/dL — ABNORMAL HIGH (ref 65–99)
POTASSIUM: 4.7 mmol/L (ref 3.5–5.1)
SODIUM: 139 mmol/L (ref 135–145)

## 2017-08-25 LAB — MAGNESIUM
MAGNESIUM: 2 mg/dL (ref 1.7–2.4)
Magnesium: 1.9 mg/dL (ref 1.7–2.4)
Magnesium: 2 mg/dL (ref 1.7–2.4)

## 2017-08-25 LAB — PHOSPHORUS
PHOSPHORUS: 3 mg/dL (ref 2.5–4.6)
Phosphorus: 3.3 mg/dL (ref 2.5–4.6)
Phosphorus: 2 mg/dL — ABNORMAL LOW (ref 2.5–4.6)

## 2017-08-25 LAB — STREP PNEUMONIAE URINARY ANTIGEN: STREP PNEUMO URINARY ANTIGEN: NEGATIVE

## 2017-08-25 MED ORDER — PRO-STAT SUGAR FREE PO LIQD
30.0000 mL | Freq: Two times a day (BID) | ORAL | Status: DC
  Administered 2017-08-25 – 2017-09-02 (×15): 30 mL
  Filled 2017-08-25 (×16): qty 30

## 2017-08-25 MED ORDER — ARTIFICIAL TEARS OPHTHALMIC OINT
1.0000 "application " | TOPICAL_OINTMENT | Freq: Three times a day (TID) | OPHTHALMIC | Status: DC
  Administered 2017-08-25 – 2017-08-27 (×7): 1 via OPHTHALMIC
  Filled 2017-08-25 (×2): qty 3.5

## 2017-08-25 MED ORDER — SODIUM CHLORIDE 0.9 % IV SOLN
2.0000 mg/h | INTRAVENOUS | Status: DC
  Administered 2017-08-25: 2 mg/h via INTRAVENOUS
  Administered 2017-08-25: 3 mg/h via INTRAVENOUS
  Administered 2017-08-26 – 2017-08-27 (×2): 4 mg/h via INTRAVENOUS
  Administered 2017-08-27 – 2017-08-28 (×2): 7 mg/h via INTRAVENOUS
  Administered 2017-08-28: 5 mg/h via INTRAVENOUS
  Administered 2017-08-29 (×2): 3 mg/h via INTRAVENOUS
  Administered 2017-08-30: 1.5 mg/h via INTRAVENOUS
  Administered 2017-08-31: 3 mg/h via INTRAVENOUS
  Filled 2017-08-25 (×13): qty 10

## 2017-08-25 MED ORDER — SODIUM BICARBONATE 8.4 % IV SOLN
INTRAVENOUS | Status: DC
  Administered 2017-08-25 – 2017-08-26 (×2): via INTRAVENOUS
  Filled 2017-08-25 (×2): qty 150

## 2017-08-25 MED ORDER — SODIUM CHLORIDE 0.9 % IV SOLN
100.0000 ug/h | INTRAVENOUS | Status: DC
  Administered 2017-08-25: 250 ug/h via INTRAVENOUS
  Filled 2017-08-25 (×4): qty 50

## 2017-08-25 MED ORDER — ORAL CARE MOUTH RINSE
15.0000 mL | Freq: Four times a day (QID) | OROMUCOSAL | Status: DC
  Administered 2017-08-25 – 2017-09-05 (×47): 15 mL via OROMUCOSAL

## 2017-08-25 MED ORDER — SODIUM CHLORIDE 0.9 % IV SOLN
0.0000 ug/min | INTRAVENOUS | Status: DC
  Administered 2017-08-25: 120 ug/min via INTRAVENOUS
  Administered 2017-08-25: 110 ug/min via INTRAVENOUS
  Administered 2017-08-25: 125 ug/min via INTRAVENOUS
  Administered 2017-08-26: 85 ug/min via INTRAVENOUS
  Administered 2017-08-27: 55 ug/min via INTRAVENOUS
  Administered 2017-08-27: 70 ug/min via INTRAVENOUS
  Administered 2017-08-28: 80 ug/min via INTRAVENOUS
  Filled 2017-08-25 (×4): qty 40
  Filled 2017-08-25 (×2): qty 4
  Filled 2017-08-25: qty 40
  Filled 2017-08-25: qty 4

## 2017-08-25 MED ORDER — ALBUTEROL SULFATE (2.5 MG/3ML) 0.083% IN NEBU
2.5000 mg | INHALATION_SOLUTION | RESPIRATORY_TRACT | Status: DC | PRN
  Administered 2017-09-04: 2.5 mg via RESPIRATORY_TRACT

## 2017-08-25 MED ORDER — CISATRACURIUM BOLUS VIA INFUSION
0.1000 mg/kg | Freq: Once | INTRAVENOUS | Status: AC
  Administered 2017-08-25: 8.4 mg via INTRAVENOUS
  Filled 2017-08-25: qty 9

## 2017-08-25 MED ORDER — MIDAZOLAM HCL 2 MG/2ML IJ SOLN
2.0000 mg | Freq: Once | INTRAMUSCULAR | Status: AC
  Administered 2017-08-25: 2 mg via INTRAVENOUS
  Filled 2017-08-25: qty 2

## 2017-08-25 MED ORDER — FENTANYL CITRATE (PF) 100 MCG/2ML IJ SOLN
100.0000 ug | Freq: Once | INTRAMUSCULAR | Status: AC
  Administered 2017-08-25: 100 ug via INTRAVENOUS
  Filled 2017-08-25: qty 2

## 2017-08-25 MED ORDER — MIDAZOLAM HCL 2 MG/2ML IJ SOLN
2.0000 mg | Freq: Once | INTRAMUSCULAR | Status: AC | PRN
  Administered 2017-08-25: 2 mg via INTRAVENOUS

## 2017-08-25 MED ORDER — INSULIN ASPART 100 UNIT/ML ~~LOC~~ SOLN: 2.0000 [IU] | SUBCUTANEOUS | Status: DC

## 2017-08-25 MED ORDER — CHLORHEXIDINE GLUCONATE 0.12% ORAL RINSE (MEDLINE KIT)
15.0000 mL | Freq: Two times a day (BID) | OROMUCOSAL | Status: DC
  Administered 2017-08-25 – 2017-09-05 (×24): 15 mL via OROMUCOSAL

## 2017-08-25 MED ORDER — FENTANYL CITRATE (PF) 100 MCG/2ML IJ SOLN: 100.0000 ug | Freq: Once | INTRAMUSCULAR | Status: DC | PRN

## 2017-08-25 MED ORDER — ROCURONIUM BROMIDE 50 MG/5ML IV SOLN
50.0000 mg | Freq: Once | INTRAVENOUS | Status: AC
  Administered 2017-08-25: 50 mg via INTRAVENOUS
  Filled 2017-08-25: qty 5

## 2017-08-25 MED ORDER — EPOPROSTENOL SODIUM 1.5 MG IV SOLR
25.0000 ng/kg/min | INTRAVENOUS | Status: DC
  Filled 2017-08-25: qty 10

## 2017-08-25 MED ORDER — INSULIN ASPART 100 UNIT/ML ~~LOC~~ SOLN
2.0000 [IU] | SUBCUTANEOUS | Status: DC
  Administered 2017-08-25 (×3): 2 [IU] via SUBCUTANEOUS
  Administered 2017-08-26: 4 [IU] via SUBCUTANEOUS
  Administered 2017-08-26: 2 [IU] via SUBCUTANEOUS

## 2017-08-25 MED ORDER — VITAL HIGH PROTEIN PO LIQD
1000.0000 mL | ORAL | Status: DC
  Administered 2017-08-25 – 2017-08-30 (×5): 1000 mL
  Filled 2017-08-25 (×12): qty 1000

## 2017-08-25 MED ORDER — FENTANYL BOLUS VIA INFUSION
50.0000 ug | INTRAVENOUS | Status: DC | PRN
  Administered 2017-08-25 – 2017-08-31 (×7): 50 ug via INTRAVENOUS
  Filled 2017-08-25: qty 50

## 2017-08-25 MED ORDER — LIDOCAINE HCL 1 % IJ SOLN
INTRAMUSCULAR | Status: AC
  Administered 2017-08-25: 20 mL
  Filled 2017-08-25: qty 20

## 2017-08-25 MED ORDER — MIDAZOLAM BOLUS VIA INFUSION
2.0000 mg | INTRAVENOUS | Status: DC | PRN
  Administered 2017-08-25 – 2017-08-31 (×7): 2 mg via INTRAVENOUS
  Filled 2017-08-25: qty 2

## 2017-08-25 MED ORDER — SODIUM CHLORIDE 0.9 % IV SOLN
3.0000 ug/kg/min | INTRAVENOUS | Status: DC
  Administered 2017-08-25 (×2): 3 ug/kg/min via INTRAVENOUS
  Administered 2017-08-26: 4 ug/kg/min via INTRAVENOUS
  Administered 2017-08-26: 3 ug/kg/min via INTRAVENOUS
  Filled 2017-08-25 (×5): qty 20

## 2017-08-25 NOTE — Progress Notes (Signed)
ABG  7.19/59/210 on 100%, peep 14  Plan Start bic gtt Wean fio2 per ards protocol Hold off proning for now   Dr. Brand Males, M.D., [facility].C.P Pulmonary and Critical Care Medicine Staff Physician, [place] Director - Interstitial Lung Disease  Program  Pulmonary [place] [facility] [place], [place], [address]  Pager: [phone], If no answer or between  15:00h - 7:00h: call [phone] Telephone: [phone]

## 2017-08-25 NOTE — Progress Notes (Signed)
Preliminary results by tech - Venous Duplex Lower Ext. Completed. Negative for deep and superficial vein thrombosis in both legs.  Oda Cogan, BS, RDMS, RVT

## 2017-08-25 NOTE — Progress Notes (Signed)
eLink Physician-Brief Progress Note Patient Name: Holly Figueroa DOB: September 17, 1965 MRN: [id number]   Date of Service  08/25/2017  HPI/Events of Note  Intubated for respiratory failure. Improving ABG. Increased sedation for WOB.  More comfortable but BP softer.  eICU Interventions  Initiated peripheral phenylephrine. CVC requested.     Intervention Category Major Interventions: Respiratory failure - evaluation and management;Hypotension - evaluation and management  Sumiya Mamaril 08/25/2017, 12:50 AM

## 2017-08-25 NOTE — Progress Notes (Signed)
Aline attempted x2 by 2 RTs. Catheter would not thread. MD Ramaswamy made aware.

## 2017-08-25 NOTE — Procedures (Signed)
Central Venous Catheter Insertion Procedure Note Holly Figueroa [id number] [birth year]  Procedure: Insertion of Central Venous Catheter Indications: Drug and/or fluid administration  Procedure Details Consent: Unable to obtain consent because of emergent medical necessity. Time Out: Verified patient identification, verified procedure, site/side was marked, verified correct patient position, special equipment/implants available, medications/allergies/relevent history reviewed, required imaging and test results available.  Performed  Maximum sterile technique was used including antiseptics, cap, gloves, gown, hand hygiene, mask and sheet. Skin prep: Chlorhexidine was used to prepare the entire left neck from the left earlobe to the angle of the mandible to the chin down to the sternal notch to the left shoulder; local anesthetic administered. Initially, a left subclavian approach was attempted (due to a short thick neck, owing to body habitus).  After 3 failed attempts at a left subclavian central venous catheter insertion, the left internal jugular vein was visualized under ultrasound guidance, and an antimicrobial bonded/coated triple lumen catheter was placed in the left internal jugular vein using the Seldinger technique.  Evaluation Blood flow good Complications: No apparent complications Patient did tolerate procedure well. Chest X-ray ordered to verify placement.  CXR: pending.  Renee Pain 08/25/2017, 4:43 AM

## 2017-08-25 NOTE — Progress Notes (Signed)
PULMONARY / CRITICAL CARE MEDICINE   Name: Holly Figueroa MRN: [id number] DOB: 24-Oct-1965    ADMISSION DATE:  08/24/2017   Brief  On 08/24/17      This 52 y.o. Caucasian female smoker is seen in consultation at the request of Dr. Abelina Bachelor for recommendations on further evaluation and management of respiratory distress.  The patient is seen in the ICU where she is currently on BiPAP support.  She is awake, alert and breathing comfortably on BiPAP, albeit with a respiratory rate in the 20s and 30s.  She claims that she is breathing comfortably and is able to provide her own clinical history.  The patient reports that she was discharged 2-3 days ago from [facility], where she underwent treatment for pneumonia.  She was discharged on antibiotic therapy but states that she has not been getting better.  Yesterday, while cleaning her house, the patient became severely dyspneic, more than normal.  Most concerning was that she did not recover despite sitting down for rest.  She presented to [facility] emergency department with dyspnea, lower abdominal pain and headache.  She was observed to have labored respirations.  She was placed on supplemental oxygen at 4-6 LPM via nasal cannula with SPO2 90-91%.  However, she subsequently was found to have an SPO2 of 72% and, consequently, was placed on BiPAP.  She promptly improved her oxygenation    EVENTs 08/24/2017 - admit and pccm consult -> bipap - > vomit and intubated    SUBJECTIVE/OVERNIGHT/INTERVAL HX  08/25/17 - severe ARDS and on nimbex since 7.06am. STill desaturating few hours ago but now improved/. Has CVL. On pressors neo at 125 mcg. Temp 104F  VITAL SIGNS: BP 101/68 (BP Location: Left Arm)   Pulse 98   Temp 100.2 F (37.9 C) (Bladder)   Resp 15   Ht 5' 6" (1.676 m)   Wt 83.9 kg (184 lb 15.5 oz)   SpO2 (!) 89%   BMI 29.85 kg/m   HEMODYNAMICS: CVP:  [16 mmHg] 16 mmHg  VENTILATOR SETTINGS: Vent Mode: PRVC FiO2 (%):   [90 %-100 %] 90 % Set Rate:  [20 bmp] 20 bmp Vt Set:  [470 mL] 470 mL PEEP:  [10 cmH20-14 cmH20] 14 cmH20 Plateau Pressure:  [17 cmH20-28 cmH20] 28 cmH20  INTAKE / OUTPUT: I/O last 3 completed shifts: In: 2909 [I.V.:2509; IV Piggyback:400] Out: 500 [Urine:500]  PHYSICAL EXAMINATION:  General Appearance:    Looks criticall ill  Head:    Normocephalic, without obvious abnormality, atraumatic  Eyes:    PERRL - yes, conjunctiva/corneas - clear      Ears:    Normal external ear canals, both ears  Nose:   NG tube - yes  Throat:  ETT TUBE - yes , OG tube - no  Neck:   Supple,  No enlargement/tenderness/nodules     Lungs:     Clear to auscultation bilaterally, Ventilator   Synchrony -  Yes , ARDS protocol, peep 14, fio2 100%, -> pulse ox 92%  Chest wall:    No deformity  Heart:    S1 and S2 normal, no murmur, CVP - no.  Pressors - yes  Abdomen:     Soft, no masses, no organomegaly  Genitalia:    Not done  Rectal:   not done  Extremities:   Extremities- intact     Skin:   Intact in exposed areas . Sacral area - no sacral decub per per RN  Neurologic:   Sedation - fent/versed/nmimbex -> RASS - -5/BIS 39 . Moves all 4s - no. CAM-ICU - na . Orientation - na      PULMONARY Recent Labs  Lab 08/24/17 0620 08/24/17 1831 08/24/17 1934  PHART 7.348* 7.177* 7.264*  PCO2ART 41.4 56.6* 46.3  PO2ART 113* 49.5* 61.7*  HCO3 22.2 20.1 20.5  O2SAT 97.8 67.5 86.2    CBC Recent Labs  Lab 08/21/17 0430 08/24/17 0511 08/24/17 0930 08/25/17 0720  HGB 8.9* 8.6* 8.4* 7.1*  HCT 29.1* 28.2* 27.0* 23.7*  WBC 8.9 24.9*  --  18.7*  PLT 421* 472*  --  309    COAGULATION Recent Labs  Lab 08/24/17 0511  INR 1.23    CARDIAC   Recent Labs  Lab 08/20/17 0524 08/20/17 1054 08/24/17 0511 08/24/17 0930 08/24/17 1536  TROPONINI <0.03 <0.03 <0.03 <0.03 <0.03   No results for input(s): PROBNP in the last 168 hours.   CHEMISTRY Recent Labs  Lab 08/19/17 1355 08/20/17 0524  08/21/17 0430 08/24/17 0511 08/24/17 0530  NA 140 139 139 138  --   K 3.3* 3.4* 3.8 3.9  --   CL 106 106 103 106  --   CO2 21* 21* 25 18*  --   GLUCOSE 197* 189* 181* 273*  --   BUN _0 --   CREATININE 0.80 0.75 0.74 0.82  --   CALCIUM 9.5 8.9 8.7* 8.5*  --   MG  --   --  1.5*  --  1.7   Estimated Creatinine Clearance: 89.5 mL/min (by C-G formula based on SCr of 0.82 mg/dL).   LIVER Recent Labs  Lab 08/19/17 1355 08/20/17 0524 08/21/17 0430 08/24/17 0511  AST _1 48*  ALT _2 42  ALKPHOS 93 84 78 76  BILITOT 0.3 0.4 0.4 0.7  PROT 8.2* 7.6 7.5 7.1  ALBUMIN 3.7 3.3* 3.2* 3.1*  INR  --   --   --  1.23     INFECTIOUS Recent Labs  Lab 08/24/17 0511 08/24/17 0930  LATICACIDVEN 2.8* 1.7  PROCALCITON 6.34  --      ENDOCRINE CBG (last 3)  Recent Labs    08/24/17 1925 08/24/17 2325 08/25/17 0516  GLUCAP 357* 166* 110*         IMAGING x48h  - image(s) personally visualized  -   highlighted in bold Dg Abd 1 View  Result Date: 08/24/2017 CLINICAL DATA:  Evaluate NG tube EXAM: ABDOMEN - 1 VIEW COMPARISON:  None. FINDINGS: The side port and distal tip of the NG tube are in the left upper quadrant. Opacities in lung bases were better evaluated on today's chest x-ray. IMPRESSION: 1. NG tube terminating in the left upper quadrant. 2. Bibasilar opacities. Electronically Signed   By: Dorise Bullion III M.D   On: 08/24/2017 20:13   Dg Chest Port 1 View  Result Date: 08/25/2017 CLINICAL DATA:  Low oxygen saturation. EXAM: PORTABLE CHEST 1 VIEW COMPARISON:  Earlier this day at 0449 hour FINDINGS: Endotracheal tube is slightly low positioning 1.2 cm from the carina. Enteric tube in place, tip below the diaphragm. Tip of the left central line in the distal SVC. Worsening diffuse bilateral interstitial opacities, now severe. Unchanged heart size and mediastinal contours. Probable developing pleural effusions. IMPRESSION: 1. Worsening diffuse bilateral opacities  may be secondary to pulmonary edema, pneumonia or ARDS. Suspect developing pleural effusions. 2. Endotracheal tube slightly low in positioning 1.2 cm from the carina.  Retraction of 1-2 cm recommended. Electronically Signed   By: Jeb Levering M.D.   On: 08/25/2017 06:36   Dg Chest Port 1 View  Result Date: 08/25/2017 CLINICAL DATA:  Line placement.  Acute respiratory failure. EXAM: PORTABLE CHEST 1 VIEW COMPARISON:  Radiograph yesterday at 1803 hour FINDINGS: New left internal jugular central venous catheter tip in the distal SVC. No pneumothorax. Endotracheal tube 2.4 cm from the carina. Enteric tube tip below the diaphragm not included in the field of view. Unchanged heart size and mediastinal contours. Slight worsening in diffuse interstitial opacities. No large pleural effusion. IMPRESSION: 1. Tip of the left internal jugular central venous catheter in the distal SVC. No pneumothorax. 2. Endotracheal and enteric tubes in place. 3. Slight worsening in diffuse interstitial opacities, may be pulmonary edema or ARDS. Electronically Signed   By: Jeb Levering M.D.   On: 08/25/2017 05:10   Dg Chest Port 1 View  Result Date: 08/24/2017 CLINICAL DATA:  ETT placement. EXAM: PORTABLE CHEST 1 VIEW COMPARISON:  August 24, 2017 and June 20, 2018. FINDINGS: The ETT terminates 2.2 cm above the carina in good position. The NG tube terminates in the left upper quadrant. Diffuse interstitial opacities are similar since earlier today and may represent pulmonary edema. The lack of cardiomegaly raises the possibility of noncardiogenic edema. No other interval changes. IMPRESSION: The ETT is in good position.  Stable interstitial opacities. Electronically Signed   By: Dorise Bullion III M.D   On: 08/24/2017 20:14   Dg Chest Port 1 View  Result Date: 08/24/2017 CLINICAL DATA:  Respiratory distress. EXAM: PORTABLE CHEST 1 VIEW COMPARISON:  08/21/2017 and older exams. FINDINGS: Irregularly thickened interstitial  markings are noted bilaterally with intervening hazy airspace opacities, increased when compared to the most recent prior exam, but less prominent than on the study dated 08/20/2017. Cardiac silhouette is normal in size. No mediastinal or hilar masses. No pneumothorax. IMPRESSION: 1. Mild worsening in lung aeration with an increase in interstitial thickening and hazy airspace opacities when compared to the most recent prior exam. This is likely due to interval worsening of pulmonary edema. Electronically Signed   By: Lajean Manes M.D.   On: 08/24/2017 08:27       ANTIBIOTICS: Quentin Angst and Zosyn started 3/2    DISCUSSION:      This is a 52 year old smoker who was recently discharged from another hospital after being treated for pneumonia.  She developed insidiously progressive and severe shortness of breath.  On admission here she has an elevated white count and elevated lactate and elevated pro-calcitonin.  She is being treated for a healthcare associated pneumonia.  ASSESSMENT / PLAN: ASSESSMENT / PLAN:  PULMONARY A: #baseline: recent dc from [facility] for pneumonia NOS #08/24/2017 0 sevre ARDS post vomit.   08/25/2017 -> in severe ARDS with 100% fio2/peep 14 and pulse ox 92%  / Vent synch +  P:   Continue ARDS protocol with nimbex since 08/25/17  Insert aline 08/25/2017  Recheck ABG and decide on bicarb v prone   CARDIOVASCULAR A:  #current septic shock post intubation  08/25/2017 -> needing high dose neo , tachy +  P:  Neo for MAP > 65 CVP per ARDS protocol Might need levophed/vaso or giaprezza   RENAL  Intake/Output Summary (Last 24 hours) at 08/25/2017 0803 Last data filed at 08/25/2017 0200 Gross per 24 hour  Intake 2649.29 ml  Output 400 ml  Net 2249.29 ml   Recent Labs  Lab 08/19/17 1355  08/20/17 0524 08/21/17 0430 08/24/17 0511  CREATININE 0.80 0.75 0.74 0.82     A:   #baseline: Nil acut  08/25/2017 -> at risk for AKI. Low Mag +  P:   Replete  Mag  GASTROINTESTINAL A:   AT risk for stress ulcer  P:   ppi STart TF  HEMATOLOGIC Recent Labs  Lab 08/21/17 0430 08/24/17 0511 08/24/17 0930 08/25/17 0720  HGB 8.9* 8.6* 8.4* 7.1*  HCT 29.1* 28.2* 27.0* 23.7*  WBC 8.9 24.9*  --  18.7*  PLT 421* 472*  --  309    A:   #RBC: unclear anemia hix. Currently anemia of baseline + critical illness #Platelet normal #WBC improving wbc  P:  - PRBC for hgb </= 6.9gm%    - exceptions are   -  if ACS susepcted/confirmed then transfuse for hgb </= 8.0gm%,  or    -  active bleeding with hemodynamic instability, then transfuse regardless of hemoglobin value   At at all times try to transfuse 1 unit prbc as possible with exception of active hemorrhage    INFECTIOUS Recent Labs  Lab 08/24/17 0511  PROCALCITON 6.34    Results for orders placed or performed during the hospital encounter of 08/24/17  MRSA PCR Screening     Status: None   Collection Time: 08/24/17  6:51 AM  Result Value Ref Range Status   MRSA by PCR NEGATIVE NEGATIVE Final    Comment:        The GeneXpert MRSA Assay (FDA approved for NASAL specimens only), is one component of a comprehensive MRSA colonization surveillance program. It is not intended to diagnose MRSA infection nor to guide or monitor treatment for MRSA infections. Performed at [facility], [place] [address] [place], [place] [address]     A:   Septic shock + ? Pneumonia is pre-admit issue v aspiration post admit v both  P:   Track PCT/lactate Check urine leg/urine strep and RVP Anti-infectives (From admission, onward)   Start     Dose/Rate Route Frequency Ordered Stop   08/24/17 1200  vancomycin (VANCOCIN) IVPB 750 mg/150 ml premix     750 mg 150 mL/hr over 60 Minutes Intravenous Every 12 hours 08/24/17 0438     08/24/17 1200  piperacillin-tazobactam (ZOSYN) IVPB 3.375 g     3.375 g 12.5 mL/hr over 240 Minutes Intravenous Every 8 hours 08/24/17 0438      08/24/17 0345  piperacillin-tazobactam (ZOSYN) IVPB 3.375 g     3.375 g 100 mL/hr over 30 Minutes Intravenous  Once 08/24/17 0337 08/24/17 0447   08/24/17 0345  vancomycin (VANCOCIN) IVPB 1000 mg/200 mL premix     1,000 mg 200 mL/hr over 60 Minutes Intravenous  Once 08/24/17 0337 08/24/17 0517       ENDOCRINE A:   At risk hyperglycemia  . On home metformin P:   ICU hyperglycemia protocol  NEUROLOGIC A:   #Baseline : reported normal. On home prozac and xanax  #Current: Deeply sedated and paralyzed for ARDS   08/25/2017 -> RASS -4/BIS 39 P:   RASS goal: -4/-5 with BIS < 50 Continue fent/versed and nimbex   FAMILY  - Updates: 08/25/2017 --> no family at bedsid  - Inter-disciplinary family meet or Palliative Care meeting due by:  DAy 7. Current LOS is LOS 1 days   DISPO Keep in ICU    The patient is critically ill with multiple organ systems failure and requires high complexity decision making for assessment  and support, frequent evaluation and titration of therapies, application of advanced monitoring technologies and extensive interpretation of multiple databases.   Critical Care Time devoted to patient care services described in this note is  30  Minutes. This time reflects time of care of this signee Dr Brand Males. This critical care time does not reflect procedure time, or teaching time or supervisory time of PA/NP/Med student/Med Resident etc but could involve care discussion time    Dr. Brand Males, M.D., [facility].C.P Pulmonary and Critical Care Medicine Staff Physician [place] [facility] Pager: [phone], If no answer or between  15:00h - 7:00h: call [phone]  08/25/2017 8:03 AM

## 2017-08-25 NOTE — Progress Notes (Signed)
  Echocardiogram 2D Echocardiogram has been performed.  Holly Figueroa F 08/25/2017, 1:09 PM

## 2017-08-25 NOTE — Progress Notes (Signed)
Initial Nutrition Assessment  DOCUMENTATION CODES:   Obesity unspecified  INTERVENTION:   Initiate Vital HP @ 45 ml/hr with 30 ml Prostat BID via NGT. Provides 1280 kcal, 124g protein and 902 ml H2O.  NUTRITION DIAGNOSIS:   Inadequate oral intake related to inability to eat as evidenced by NPO status.  GOAL:   Provide needs based on ASPEN/SCCM guidelines  MONITOR:   Vent status, Labs, Weight trends, TF tolerance, I & O's  REASON FOR ASSESSMENT:   Consult, Ventilator Enteral/tube feeding initiation and management  ASSESSMENT:   52 y.o. female with medical history significant of mitral stenosis, DM type II, CHF last EF 65-70% with grade 1 diastolic dysfunction  Per chart review, pt just recently discharged from [facility] on 2/27.Marland Kitchen Per records pt was consuming ~70% of meals at that time.  Per weight records, pt's weight was stable at 175 lb in December 2018 into January 2019. Pt's weight has now in the 180 lb range since February 2019.  Patient is currently intubated on ventilator support MV: 10.1 L/min Temp (24hrs), Avg:101.2 F (38.4 C), Min:97.7 F (36.5 C), Max:104.2 F (40.1 C)  Medications reviewed. Labs reviewed: Mg/Phos WNL  NUTRITION - FOCUSED PHYSICAL EXAM:  Nutrition focused physical exam shows no sign of depletion of muscle mass or body fat.  Diet Order:  Diet NPO time specified  EDUCATION NEEDS:   No education needs have been identified at this time  Skin:  Skin Assessment: Reviewed RN Assessment  Last BM:  3/1  Height:   Ht Readings from Last 1 Encounters:  08/24/17 5' 6" (1.676 m)    Weight:   Wt Readings from Last 1 Encounters:  08/25/17 184 lb 15.5 oz (83.9 kg)    Ideal Body Weight:  59.1 kg  BMI:  Body mass index is 29.85 kg/m.  Estimated Nutritional Needs:   Kcal:  1250-1450  Protein:  110-120g  Fluid:  1.5L/day  Clayton Bibles, MS, RD, LDN [place] Dietitian Pager: [phone] After Hours Pager:  [phone]

## 2017-08-26 ENCOUNTER — Inpatient Hospital Stay (HOSPITAL_COMMUNITY): Payer: Medicare PPO

## 2017-08-26 LAB — BASIC METABOLIC PANEL
Anion gap: 5 (ref 5–15)
BUN: 19 mg/dL (ref 6–20)
CHLORIDE: 108 mmol/L (ref 101–111)
CO2: 27 mmol/L (ref 22–32)
Calcium: 7.9 mg/dL — ABNORMAL LOW (ref 8.9–10.3)
Creatinine, Ser: 0.89 mg/dL (ref 0.44–1.00)
GFR calc Af Amer: >60 mL/min (ref >60)
GFR calc non Af Amer: >60 mL/min (ref >60)
GLUCOSE: 229 mg/dL — AB (ref 65–99)
Potassium: 4.3 mmol/L (ref 3.5–5.1)
Sodium: 140 mmol/L (ref 135–145)

## 2017-08-26 LAB — C4 COMPLEMENT: Complement C4, Body Fluid: 40 mg/dL (ref 14–44)

## 2017-08-26 LAB — CREATININE, SERUM
Creatinine, Ser: 0.88 mg/dL (ref 0.44–1.00)
GFR calc Af Amer: >60 mL/min (ref >60)
GFR calc non Af Amer: >60 mL/min (ref >60)

## 2017-08-26 LAB — BLOOD GAS, ARTERIAL
Acid-Base Excess: 6.2 mmol/L — ABNORMAL HIGH (ref 0.0–2.0)
Bicarbonate: 30 mmol/L — ABNORMAL HIGH (ref 20.0–28.0)
DRAWN BY: 295031
FIO2: 60
MECHVT: 360 mL
O2 Saturation: 99.5 %
PEEP: 14 cmH2O
PO2 ART: 192 mmHg — AB (ref 83.0–108.0)
Patient temperature: 98.6
RATE: 35 resp/min
pCO2 arterial: 41.5 mmHg (ref 32.0–48.0)
pH, Arterial: 7.473 — ABNORMAL HIGH (ref 7.350–7.450)

## 2017-08-26 LAB — AMYLASE: Amylase: 77 U/L (ref 28–100)

## 2017-08-26 LAB — LEGIONELLA PNEUMOPHILA SEROGP 1 UR AG: L. pneumophila Serogp 1 Ur Ag: NEGATIVE

## 2017-08-26 LAB — EXTRACTABLE NUCLEAR ANTIGEN ANTIBODY
DS DNA AB: 1 [IU]/mL (ref 0–9)
ENA SM Ab Ser-aCnc: <0.2 AI (ref 0.0–0.9)
Ribonucleic Protein: <0.2 AI (ref 0.0–0.9)
SSA (Ro) (ENA) Antibody, IgG: <0.2 AI (ref 0.0–0.9)

## 2017-08-26 LAB — LIPASE, BLOOD: LIPASE: 21 U/L (ref 11–51)

## 2017-08-26 LAB — CBC WITH DIFFERENTIAL/PLATELET
BASOS PCT: 0 %
Basophils Absolute: 0 10*3/uL (ref 0.0–0.1)
Eosinophils Absolute: 0 10*3/uL (ref 0.0–0.7)
Eosinophils Relative: 0 %
HEMATOCRIT: 23.9 % — AB (ref 36.0–46.0)
HEMOGLOBIN: 7.1 g/dL — AB (ref 12.0–15.0)
LYMPHS ABS: 0.5 10*3/uL — AB (ref 0.7–4.0)
LYMPHS PCT: 7 %
MCH: 27.8 pg (ref 26.0–34.0)
MCHC: 29.7 g/dL — ABNORMAL LOW (ref 30.0–36.0)
MCV: 93.7 fL (ref 78.0–100.0)
Monocytes Absolute: 0.2 10*3/uL (ref 0.1–1.0)
Monocytes Relative: 4 %
NEUTROS ABS: 5.8 10*3/uL (ref 1.7–7.7)
NEUTROS PCT: 89 %
Platelets: 311 10*3/uL (ref 150–400)
RBC: 2.55 MIL/uL — ABNORMAL LOW (ref 3.87–5.11)
RDW: 15.9 % — ABNORMAL HIGH (ref 11.5–15.5)
WBC: 6.5 10*3/uL (ref 4.0–10.5)

## 2017-08-26 LAB — GLUCOSE, CAPILLARY
GLUCOSE-CAPILLARY: 203 mg/dL — AB (ref 65–99)
GLUCOSE-CAPILLARY: 215 mg/dL — AB (ref 65–99)
Glucose-Capillary: 184 mg/dL — ABNORMAL HIGH (ref 65–99)
Glucose-Capillary: 198 mg/dL — ABNORMAL HIGH (ref 65–99)
Glucose-Capillary: 180 mg/dL — ABNORMAL HIGH (ref 65–99)

## 2017-08-26 LAB — MPO/PR-3 (ANCA) ANTIBODIES: ANCA Proteinase 3: <3.5 U/mL (ref 0.0–3.5)

## 2017-08-26 LAB — TROPONIN I: Troponin I: 0.19 ng/mL (ref <0.03)

## 2017-08-26 LAB — PHOSPHORUS: PHOSPHORUS: 1.8 mg/dL — AB (ref 2.5–4.6)

## 2017-08-26 LAB — LACTIC ACID, PLASMA: LACTIC ACID, VENOUS: 2 mmol/L — AB (ref 0.5–1.9)

## 2017-08-26 LAB — MAGNESIUM: Magnesium: 2.1 mg/dL (ref 1.7–2.4)

## 2017-08-26 LAB — HIV ANTIBODY (ROUTINE TESTING W REFLEX): HIV SCREEN 4TH GENERATION: NONREACTIVE

## 2017-08-26 LAB — C3 COMPLEMENT: C3 COMPLEMENT: 129 mg/dL (ref 82–167)

## 2017-08-26 LAB — PROCALCITONIN: Procalcitonin: 19.3 ng/mL

## 2017-08-26 MED ORDER — INSULIN ASPART 100 UNIT/ML ~~LOC~~ SOLN
3.0000 [IU] | SUBCUTANEOUS | Status: DC
  Administered 2017-08-26 – 2017-08-27 (×7): 3 [IU] via SUBCUTANEOUS

## 2017-08-26 MED ORDER — FUROSEMIDE 10 MG/ML IJ SOLN
40.0000 mg | Freq: Once | INTRAMUSCULAR | Status: AC
  Administered 2017-08-26: 40 mg via INTRAVENOUS
  Filled 2017-08-26: qty 4

## 2017-08-26 MED ORDER — OSELTAMIVIR PHOSPHATE 6 MG/ML PO SUSR
75.0000 mg | Freq: Two times a day (BID) | ORAL | Status: AC
  Administered 2017-08-26 – 2017-08-30 (×10): 75 mg
  Filled 2017-08-26 (×10): qty 12.5

## 2017-08-26 MED ORDER — INSULIN GLARGINE 100 UNIT/ML ~~LOC~~ SOLN
15.0000 [IU] | Freq: Every day | SUBCUTANEOUS | Status: DC
  Administered 2017-08-26: 15 [IU] via SUBCUTANEOUS
  Filled 2017-08-26 (×2): qty 0.15

## 2017-08-26 MED ORDER — SODIUM GLYCEROPHOSPHATE 1 MMOLE/ML IV SOLN
20.0000 mmol | Freq: Once | INTRAVENOUS | Status: AC
  Administered 2017-08-26: 20 mmol via INTRAVENOUS
  Filled 2017-08-26: qty 20

## 2017-08-26 MED ORDER — INSULIN ASPART 100 UNIT/ML ~~LOC~~ SOLN
0.0000 [IU] | SUBCUTANEOUS | Status: DC
  Administered 2017-08-26: 5 [IU] via SUBCUTANEOUS
  Administered 2017-08-26: 3 [IU] via SUBCUTANEOUS
  Administered 2017-08-26: 5 [IU] via SUBCUTANEOUS
  Administered 2017-08-26 – 2017-08-27 (×2): 3 [IU] via SUBCUTANEOUS
  Administered 2017-08-27: 5 [IU] via SUBCUTANEOUS
  Administered 2017-08-27: 8 [IU] via SUBCUTANEOUS
  Administered 2017-08-27: 3 [IU] via SUBCUTANEOUS
  Administered 2017-08-27 (×2): 5 [IU] via SUBCUTANEOUS
  Administered 2017-08-28 (×6): 3 [IU] via SUBCUTANEOUS
  Administered 2017-08-28 – 2017-08-29 (×2): 2 [IU] via SUBCUTANEOUS
  Administered 2017-08-29 – 2017-08-30 (×8): 3 [IU] via SUBCUTANEOUS
  Administered 2017-08-30: 2 [IU] via SUBCUTANEOUS
  Administered 2017-08-31 (×4): 5 [IU] via SUBCUTANEOUS
  Administered 2017-08-31: 8 [IU] via SUBCUTANEOUS
  Administered 2017-08-31: 5 [IU] via SUBCUTANEOUS
  Administered 2017-09-01 (×2): 3 [IU] via SUBCUTANEOUS
  Administered 2017-09-01 (×2): 2 [IU] via SUBCUTANEOUS
  Administered 2017-09-01: 3 [IU] via SUBCUTANEOUS
  Administered 2017-09-02 (×2): 2 [IU] via SUBCUTANEOUS
  Administered 2017-09-02: 3 [IU] via SUBCUTANEOUS
  Administered 2017-09-03: 2 [IU] via SUBCUTANEOUS
  Administered 2017-09-03: 3 [IU] via SUBCUTANEOUS
  Administered 2017-09-03 – 2017-09-04 (×2): 2 [IU] via SUBCUTANEOUS
  Administered 2017-09-04: 3 [IU] via SUBCUTANEOUS
  Administered 2017-09-04: 5 [IU] via SUBCUTANEOUS
  Administered 2017-09-04: 2 [IU] via SUBCUTANEOUS
  Administered 2017-09-05: 4 [IU] via SUBCUTANEOUS
  Administered 2017-09-05: 2 [IU] via SUBCUTANEOUS
  Administered 2017-09-05 (×2): 5 [IU] via SUBCUTANEOUS
  Administered 2017-09-05: 2 [IU] via SUBCUTANEOUS
  Administered 2017-09-06: 3 [IU] via SUBCUTANEOUS
  Administered 2017-09-06: 2 [IU] via SUBCUTANEOUS
  Administered 2017-09-06: 3 [IU] via SUBCUTANEOUS
  Administered 2017-09-06: 2 [IU] via SUBCUTANEOUS
  Administered 2017-09-06 – 2017-09-07 (×2): 3 [IU] via SUBCUTANEOUS
  Administered 2017-09-07 (×2): 8 [IU] via SUBCUTANEOUS
  Administered 2017-09-08 (×3): 5 [IU] via SUBCUTANEOUS
  Administered 2017-09-08 (×2): 3 [IU] via SUBCUTANEOUS
  Administered 2017-09-08 – 2017-09-09 (×2): 5 [IU] via SUBCUTANEOUS
  Administered 2017-09-09: 2 [IU] via SUBCUTANEOUS
  Administered 2017-09-09: 5 [IU] via SUBCUTANEOUS

## 2017-08-26 MED ORDER — SODIUM BICARBONATE 8.4 % IV SOLN
100.0000 meq | Freq: Once | INTRAVENOUS | Status: AC
  Administered 2017-08-26: 100 meq via INTRAVENOUS
  Filled 2017-08-26: qty 100

## 2017-08-26 MED ORDER — POTASSIUM CHLORIDE 20 MEQ/15ML (10%) PO SOLN
20.0000 meq | Freq: Once | ORAL | Status: AC
  Administered 2017-08-26: 20 meq via ORAL
  Filled 2017-08-26: qty 15

## 2017-08-26 NOTE — Progress Notes (Signed)
eLink Physician-Brief Progress Note Patient Name: Holly Figueroa DOB: 07-09-1965 MRN: [id number]   Date of Service  08/26/2017  HPI/Events of Note  Ongoing mixed resp and metabolic acidosis with pH of 7.19/70/84/25  eICU Interventions  Increase RR to 35 2 amps of bicarb IVP Increase Bicarb gtt to 125 hour Recheck ABG at 7am     Intervention Category Major Interventions: Acid-Base disturbance - evaluation and management  DETERDING,ELIZABETH 08/26/2017, 4:24 AM

## 2017-08-26 NOTE — Progress Notes (Signed)
CRITICAL VALUE ALERT  Critical Value:  Lactic Acid 2.0, Troponin .09   Date & Time Notied:  08/26/2017 0455    Provider Notified: E-Link   Orders Received/Actions taken: Awaiting further orders.

## 2017-08-26 NOTE — Progress Notes (Signed)
CRITICAL VALUE ALERT  Critical Value: (ABG) pH 7.199, pCO2 69.8  Date & Time Notied:  08/26/2017 0415   Provider Notified: E-Link   Orders Received/Actions taken: Increased Bicarb drip rateto 125, 100 mEq Bicarb IV push, Vent setting changes.

## 2017-08-26 NOTE — Progress Notes (Signed)
PULMONARY / CRITICAL CARE MEDICINE   Name: Holly Figueroa MRN: [id number] DOB: 16-Jan-1966    ADMISSION DATE:  08/24/2017 CONSULTATION DATE:  08/24/17  REFERRING MD:  Dr. Tamala Julian / [facility]   CHIEF COMPLAINT:  SOB  BRIEF SUMMARY:  52 y/o F, smoker, admitted 3/2 with complaints of SOB.  Initially admitted per [facility]. PCCM consulted for increasing respiratory distress on BiPAP.  Per history, the patient was discharged 2-3 days prior from [facility] where she was treated for PNA.  Despite antibiotics, she felt she was not getting better.  She developed increasing SOB, headache and lower abdominal pain.  On presentation, she was found to need 4-6L O2.  Despite supplementation, she required BiPAP which initially improved her symptoms. She was observed on BiPAP.  During the evening of 3/2, the patient vomited and required intubation.  She developed severe ARDS and was placed on paralytics on 3/3.  She was also febrile to 104.    SUBJECTIVE:  RN reports CVP of 15.  No acute events overnight.  Remains on 50 mcg neo, paralytics / sedation.  Last BM 3/3.    VITAL SIGNS: BP (!) 96/54   Pulse 98   Temp (!) 101.8 F (38.8 C)   Resp (!) 35   Ht _0  (1.676 m)   Wt 194 lb 3.6 oz (88.1 kg)   SpO2 100%   BMI 31.35 kg/m   HEMODYNAMICS: CVP:  [16 mmHg-17 mmHg] 16 mmHg  VENTILATOR SETTINGS: Vent Mode: PRVC FiO2 (%):  [60 %-100 %] 60 % Set Rate:  [22 bmp-35 bmp] 35 bmp Vt Set:  [360 mL-470 mL] 360 mL PEEP:  [10 cmH20-14 cmH20] 14 cmH20 Plateau Pressure:  [21 cmH20-34 cmH20] 29 cmH20  INTAKE / OUTPUT: I/O last 3 completed shifts: In: 7939.9 [I.V.:6821.4; NG/GT:568.5; IV Piggyback:550] Out: 1027 [Urine:1485]  PHYSICAL EXAMINATION: General: critically ill appearing adult female in NAD  HEENT: MM pink/moist, ETT, L IJ TLC  Neuro: sedate / paralyzed  CV: s1s2 rrr, ? 3/6 SEM PULM: even/non-labored, lungs bilaterally coarse GI: soft, non-tender, bsx4 active  Extremities: warm/dry, generalized  1-2+ edema  Skin: no rashes or lesions  LABS:  BMET Recent Labs  Lab 08/24/17 0511 08/25/17 0720 08/26/17 0314  NA 138 139 140  K 3.9 4.7 4.3  CL 106 110 108  CO2 18* 20* 27  BUN _1 CREATININE 0.82 0.86 0.88  0.89  GLUCOSE 273* 173* 229*    Electrolytes Recent Labs  Lab 08/24/17 0511  08/25/17 0720 08/25/17 1117 08/25/17 1700 08/26/17 0314  CALCIUM 8.5*  --  7.8*  --   --  7.9*  MG  --    < > 1.9 2.0 2.0 2.1  PHOS  --    < > 3.3 3.0 2.0* 1.8*   < > = values in this interval not displayed.    CBC Recent Labs  Lab 08/25/17 0720 08/25/17 1200 08/26/17 0314  WBC 18.7* 14.6* 6.5  HGB 7.1* 7.5* 7.1*  HCT 23.7* 25.1* 23.9*  PLT 309 370 311    Coag's Recent Labs  Lab 08/24/17 0511  APTT 37*  INR 1.23    Sepsis Markers Recent Labs  Lab 08/24/17 0511 08/24/17 0930 08/26/17 0314  LATICACIDVEN 2.8* 1.7 2.0*  PROCALCITON 6.34  --  19.30    ABG Recent Labs  Lab 08/25/17 1155 08/26/17 0342 08/26/17 0535  PHART 7.192* 7.199* 7.349*  PCO2ART 59.1* 69.8* 53.2*  PO2ART 210* 83.6 141*    Liver  Enzymes Recent Labs  Lab 08/20/17 0524 08/21/17 0430 08/24/17 0511  AST 27 21 48*  ALT 25 21 42  ALKPHOS 84 78 76  BILITOT 0.4 0.4 0.7  ALBUMIN 3.3* 3.2* 3.1*    Cardiac Enzymes Recent Labs  Lab 08/24/17 0930 08/24/17 1536 08/26/17 0314  TROPONINI <0.03 <0.03 0.19*    Glucose Recent Labs  Lab 08/25/17 0801 08/25/17 1112 08/25/17 1540 08/25/17 2000 08/25/17 2316 08/26/17 0322  GLUCAP 172* 137* 125* 137* 145* 184*    Imaging Dg Chest Port 1 View  Result Date: 08/26/2017 CLINICAL DATA:  Respiratory failure, intubated patient. Pneumonia, CHF, sepsis, current smoker. EXAM: PORTABLE CHEST 1 VIEW COMPARISON:  Portable chest x-ray of August 25, 2017 FINDINGS: The lungs are well-expanded. The interstitial markings are increased but less conspicuous than on at yesterday's study. The cardiac silhouette remains enlarged and the pulmonary  vascularity engorged. Small left pleural effusion is stable. The endotracheal tube tip lies 1.8 cm above the carina. The esophagogastric tube tip in proximal port project below the inferior margin of the image. The left internal jugular venous catheter tip projects over the distal third of the SVC. IMPRESSION: Improved appearance of both lungs with decreased interstitial and alveolar airspace opacities likely reflecting resolving pneumonia or CHF or a combination of the two. Small left pleural effusion likely layering posteriorly. The support tubes are in reasonable position. Electronically Signed   By: David  Martinique M.D.   On: 08/26/2017 07:30     STUDIES:    CULTURES: BCx2 3/2 >>  Strep Pneumo Antigen 3/3 >> negative  RVP 3/3 >> influenza B positive   ANTIBIOTICS: Zosyn 3/2 >>  Vanco 3/2 >>  SIGNIFICANT EVENTS: 3/02  Admit with SOB, vomited on BiPAP > intubated, ARDS  3/03  Placed on paralytics, requiring pressors 3/04  Remains on paralytics, low dose neo.  Bicarb gtt stopped    LINES/TUBES: ETT 3/2 >>   DISCUSSION:  52 y/o F, smoker, admitted with influenza B & respiratory distress.  Vomited 3/2 and developed ARDS in the setting of flu and suspected aspiration while on BiPAP.  Paralyzed 3/3.    ASSESSMENT / PLAN:  PULMONARY A: ARDS - in setting of influenza B + PNA Aspiration PNA  Recent PNA (NOS) Tobacco Abuse  P:   PRVC 6 cc/kg  Rate 35  Low Vt ventilation / lung protective measures  Follow CXR  Sedation / paralytics per ARDS protocol  Follow ABG > reassess at 1300 off bicarb and in am  Lasix 40 mg IV x1   CARDIOVASCULAR A:  Shock - multifactorial in setting of septic + sedation  P:  ICU monitoring  Wean Neo for MAP > 65 Trend CVP  RENAL A:   Hypophosphatemia  Mixed Acidosis  P:   Discontinue bicarbonate gtt  KVO LR  Trend BMP / urinary output Replace electrolytes as indicated, Na Phos 3/4 Avoid nephrotoxic agents, ensure adequate renal  perfusion  GASTROINTESTINAL A:   Abdominal Pain  Vomiting  P:   NPO / OGT TF per nutrition  PPI for SUP   HEMATOLOGIC A:   Anemia - suspect of critical illness + phlebotomy  P:  Lovenox for DVT prophylaxis  Trend CBC   INFECTIOUS A:   Influenza B PNA - NOS P:   Follow cultures as above ABX as above  Discontinue vancomycin 3/4  Begin Tamiflu   ENDOCRINE A:   DM   P:   SSI, moderate scale 3 units TF coverage Q4  Lantus  15 units QHS  NEUROLOGIC A:   Hx Anxiety, Depression, Panic Attacks, Benzo Dependent  P:   RASS goal: -5  Nimbex, versed, fentanyl per ARDS protocol  Monitor BIS  Passive ROM    FAMILY  - Updates: No family available AM 3/4.  Will update on arrival.   - Inter-disciplinary family meet or Palliative Care meeting due by:  3/9   CC Time: 25 minutes   Noe Gens, NP-C [place] [facility] Pgr: [phone] or if no answer [phone] 08/26/2017, 8:07 AM

## 2017-08-27 ENCOUNTER — Inpatient Hospital Stay (HOSPITAL_COMMUNITY): Payer: Medicare PPO

## 2017-08-27 DIAGNOSIS — J9600 Acute respiratory failure, unspecified whether with hypoxia or hypercapnia: Secondary | ICD-10-CM

## 2017-08-27 LAB — GLUCOSE, CAPILLARY
GLUCOSE-CAPILLARY: 252 mg/dL — AB (ref 65–99)
GLUCOSE-CAPILLARY: 182 mg/dL — AB (ref 65–99)
Glucose-Capillary: 228 mg/dL — ABNORMAL HIGH (ref 65–99)
Glucose-Capillary: 230 mg/dL — ABNORMAL HIGH (ref 65–99)
Glucose-Capillary: 203 mg/dL — ABNORMAL HIGH (ref 65–99)
Glucose-Capillary: 178 mg/dL — ABNORMAL HIGH (ref 65–99)
Glucose-Capillary: 215 mg/dL — ABNORMAL HIGH (ref 65–99)

## 2017-08-27 LAB — CBC WITH DIFFERENTIAL/PLATELET
Basophils Absolute: 0 10*3/uL (ref 0.0–0.1)
Basophils Relative: 0 %
EOS ABS: 0.1 10*3/uL (ref 0.0–0.7)
Eosinophils Relative: 1 %
HEMATOCRIT: 21.7 % — AB (ref 36.0–46.0)
Hemoglobin: 6.6 g/dL — CL (ref 12.0–15.0)
LYMPHS ABS: 1 10*3/uL (ref 0.7–4.0)
LYMPHS PCT: 19 %
MCH: 27.6 pg (ref 26.0–34.0)
MCHC: 30.4 g/dL (ref 30.0–36.0)
MCV: 90.8 fL (ref 78.0–100.0)
MONOS PCT: 5 %
Monocytes Absolute: 0.2 10*3/uL (ref 0.1–1.0)
NEUTROS PCT: 75 %
Neutro Abs: 3.8 10*3/uL (ref 1.7–7.7)
Platelets: 299 10*3/uL (ref 150–400)
RBC: 2.39 MIL/uL — AB (ref 3.87–5.11)
RDW: 15.8 % — ABNORMAL HIGH (ref 11.5–15.5)
WBC: 5.1 10*3/uL (ref 4.0–10.5)

## 2017-08-27 LAB — BLOOD GAS, ARTERIAL
ACID-BASE EXCESS: 6.1 mmol/L — AB (ref 0.0–2.0)
BICARBONATE: 30.5 mmol/L — AB (ref 20.0–28.0)
Drawn by: 232811
FIO2: 50
LHR: 35 {breaths}/min
MECHVT: 360 mL
O2 SAT: 97.9 %
PEEP/CPAP: 10 cmH2O
PH ART: 7.404 (ref 7.350–7.450)
PO2 ART: 106 mmHg (ref 83.0–108.0)
Patient temperature: 38.7
pCO2 arterial: 50.9 mmHg — ABNORMAL HIGH (ref 32.0–48.0)

## 2017-08-27 LAB — HEMOGLOBIN AND HEMATOCRIT, BLOOD
HCT: 33.3 % — ABNORMAL LOW (ref 36.0–46.0)
Hemoglobin: 10.1 g/dL — ABNORMAL LOW (ref 12.0–15.0)

## 2017-08-27 LAB — PREPARE RBC (CROSSMATCH)

## 2017-08-27 LAB — BASIC METABOLIC PANEL
ANION GAP: 8 (ref 5–15)
BUN: 24 mg/dL — ABNORMAL HIGH (ref 6–20)
CALCIUM: 6.2 mg/dL — AB (ref 8.9–10.3)
CHLORIDE: 107 mmol/L (ref 101–111)
CO2: 31 mmol/L (ref 22–32)
Creatinine, Ser: 0.82 mg/dL (ref 0.44–1.00)
GFR calc non Af Amer: >60 mL/min (ref >60)
GLUCOSE: 219 mg/dL — AB (ref 65–99)
POTASSIUM: 4.5 mmol/L (ref 3.5–5.1)
Sodium: 146 mmol/L — ABNORMAL HIGH (ref 135–145)

## 2017-08-27 LAB — PREGNANCY, URINE: PREG TEST UR: NEGATIVE

## 2017-08-27 LAB — PHOSPHORUS: Phosphorus: 1.4 mg/dL — ABNORMAL LOW (ref 2.5–4.6)

## 2017-08-27 LAB — ANCA TITERS: P-ANCA: <1:20 {titer}

## 2017-08-27 LAB — MAGNESIUM: Magnesium: 1.5 mg/dL — ABNORMAL LOW (ref 1.7–2.4)

## 2017-08-27 MED ORDER — SODIUM CHLORIDE 0.9% FLUSH: 10.0000 mL | INTRAVENOUS | Status: DC | PRN

## 2017-08-27 MED ORDER — SODIUM GLYCEROPHOSPHATE 1 MMOLE/ML IV SOLN
20.0000 mmol | Freq: Once | INTRAVENOUS | Status: AC
  Administered 2017-08-27: 20 mmol via INTRAVENOUS
  Filled 2017-08-27 (×2): qty 20

## 2017-08-27 MED ORDER — INSULIN GLARGINE 100 UNIT/ML ~~LOC~~ SOLN
18.0000 [IU] | Freq: Every day | SUBCUTANEOUS | Status: DC
  Administered 2017-08-27 – 2017-09-14 (×15): 18 [IU] via SUBCUTANEOUS
  Filled 2017-08-27 (×20): qty 0.18

## 2017-08-27 MED ORDER — CHLORHEXIDINE GLUCONATE CLOTH 2 % EX PADS
6.0000 | MEDICATED_PAD | Freq: Every day | CUTANEOUS | Status: DC
  Administered 2017-08-29 – 2017-09-04 (×7): 6 via TOPICAL

## 2017-08-27 MED ORDER — SENNOSIDES-DOCUSATE SODIUM 8.6-50 MG PO TABS
2.0000 | ORAL_TABLET | Freq: Every day | ORAL | Status: DC
  Administered 2017-08-27 – 2017-08-29 (×3): 2 via ORAL
  Filled 2017-08-27 (×4): qty 2

## 2017-08-27 MED ORDER — MAGNESIUM SULFATE 2 GM/50ML IV SOLN
2.0000 g | Freq: Once | INTRAVENOUS | Status: AC
  Administered 2017-08-27: 2 g via INTRAVENOUS
  Filled 2017-08-27: qty 50

## 2017-08-27 MED ORDER — SODIUM CHLORIDE 0.9% FLUSH
10.0000 mL | Freq: Two times a day (BID) | INTRAVENOUS | Status: DC
  Administered 2017-08-28: 10 mL
  Administered 2017-08-28 (×2): 20 mL
  Administered 2017-08-29 – 2017-09-02 (×10): 10 mL
  Administered 2017-09-03: 20 mL
  Administered 2017-09-03 – 2017-09-06 (×5): 10 mL
  Administered 2017-09-07: 40 mL
  Administered 2017-09-08: 10 mL

## 2017-08-27 MED ORDER — INSULIN ASPART 100 UNIT/ML ~~LOC~~ SOLN
4.0000 [IU] | SUBCUTANEOUS | Status: DC
  Administered 2017-08-27 – 2017-09-17 (×83): 4 [IU] via SUBCUTANEOUS

## 2017-08-27 MED ORDER — FUROSEMIDE 10 MG/ML IJ SOLN
40.0000 mg | Freq: Two times a day (BID) | INTRAMUSCULAR | Status: AC
  Administered 2017-08-27 (×2): 40 mg via INTRAVENOUS
  Filled 2017-08-27 (×2): qty 4

## 2017-08-27 MED ORDER — SODIUM CHLORIDE 0.9 % IV SOLN
Freq: Once | INTRAVENOUS | Status: AC
  Administered 2017-08-27: 06:00:00 via INTRAVENOUS

## 2017-08-27 NOTE — Progress Notes (Signed)
Pharmacy Antibiotic Note  Holly Figueroa is a 52 y.o. female recently hospitalized and discharged from [facility] on 08/21/17, presented to [facility]  on 08/24/2017 with SOB and subsequently intubated.  Broad abx with vancomycin and zosyn started on admission for sepsis/PNA.  MRSA PCR neg and positive for influ B with vancomycin d/ced on 3/4 and tamiflu started on 3/4.  Today, 08/27/2017: - Tmax 102.4. WBC down 5.1 -  SCr 0.82 (CrCl 85) -  PCT 19.3 on 3/4 - All cultures with this admission have been negative thus far - plan to d/c paralytic today   Plan: - continue zosyn 3.375 gm IV q8h (infuse over 4 hrs) - f/u cx, renal function and clinical status _____________________________________  Height: _0  (167.6 cm) Weight: 194 lb 14.2 oz (88.4 kg) IBW/kg (Calculated) : 59.3  Temp (24hrs), Avg:101.2 F (38.4 C), Min:99.5 F (37.5 C), Max:102.4 F (39.1 C)  Recent Labs  Lab 08/21/17 0430 08/24/17 0511 08/24/17 0930 08/25/17 0720 08/25/17 1200 08/26/17 0314 08/27/17 0306  WBC 8.9 24.9*  --  18.7* 14.6* 6.5 5.1  CREATININE 0.74 0.82  --  0.86  --  0.88  0.89 0.82  LATICACIDVEN  --  2.8* 1.7  --   --  2.0*  --     Estimated Creatinine Clearance: 91.9 mL/min (by C-G formula based on SCr of 0.82 mg/dL).    Allergies  Allergen Reactions  . Iodinated Diagnostic Agents Anaphylaxis  . Aspirin Other (See Comments)    "Possible blood in stool" the 342m. Can take 89m  . Ibuprofen Other (See Comments)    "Possible blood in stool"  . Sulfa Antibiotics Hives  . Zofran [Ondansetron Hcl]     Bad headaches  . Dilaudid [Hydromorphone Hcl] Itching and Palpitations  . Hydrocodone Palpitations    Antimicrobials this admission:  3/2 Vancomycin  >>3/4 3/2 Zosyn  >>  3/4 tamiflu>>3/9  Dose adjustments this admission:  --   Microbiology results:  2/25 ucx: >100K klebsiella pneu (R= amp, I= unasyn, nitro)  3/2 BCx x2:  3/2 MRSA PCR: negative 3/3 resp panel pcr: influ B pos 3/5 TA:  3/3  ur legionella: neg  Thank you for allowing pharmacy to be a part of this patient's care.  PhLynelle Doctor/10/2017 10:12 AM

## 2017-08-27 NOTE — Progress Notes (Addendum)
PULMONARY / CRITICAL CARE MEDICINE   Name: Holly Figueroa MRN: [id number] DOB: 1966-01-19    ADMISSION DATE:  08/24/2017 CONSULTATION DATE:  08/24/17  REFERRING MD:  Dr. Tamala Julian / [facility]   CHIEF COMPLAINT:  SOB  BRIEF SUMMARY:  52 y/o F, smoker, admitted 3/2 with complaints of SOB.  Initially admitted per [facility]. PCCM consulted for increasing respiratory distress on BiPAP.  Per history, the patient was discharged 2-3 days prior from [facility] where she was treated for PNA.  Despite antibiotics, she felt she was not getting better.  She developed increasing SOB, headache and lower abdominal pain.  On presentation, she was found to need 4-6L O2.  Despite supplementation, she required BiPAP which initially improved her symptoms. She was observed on BiPAP.  During the evening of 3/2, the patient vomited and required intubation.  She developed severe ARDS and was placed on paralytics on 3/3.  She was also febrile to 104.   SUBJECTIVE:  RN reports no acute events.  PRBC's infusing this am.  Electrolyte disturbances (mg, phos, ca).  I/O net neg 756 from lasix (UOP 3500).  Tmax 102.2.    VITAL SIGNS: BP (!) 89/54   Pulse 96   Temp (!) 101.3 F (38.5 C)   Resp (!) 35   Ht _0  (1.676 m)   Wt 194 lb 14.2 oz (88.4 kg)   SpO2 94%   BMI 31.46 kg/m   HEMODYNAMICS:    VENTILATOR SETTINGS: Vent Mode: PRVC FiO2 (%):  [40 %-60 %] 40 % Set Rate:  [35 bmp] 35 bmp Vt Set:  [360 mL] 360 mL PEEP:  [10 cmH20-14 cmH20] 10 cmH20 Plateau Pressure:  [26 cmH20-29 cmH20] 26 cmH20  INTAKE / OUTPUT: I/O last 3 completed shifts: In: 5168.9 [I.V.:3193.9; NG/GT:1575; IV WNIOEVOJJ:009] Out: 3818 [Urine:3850]  PHYSICAL EXAMINATION: General: critically ill appearing female in NAD on vent   HEENT: MM pink/moist, ETT, eyebrow movement to voice  Neuro: sedate / paralyzed, pupils 55m =/R CV: s1s2 rrr, no m/r/g PULM: even/non-labored, lungs bilaterally coarse GEX:HBZJ non-tender, bsx4 active  Extremities:  warm/dry, 1-2+ generalized edema  Skin: no rashes or lesions  LABS:  BMET Recent Labs  Lab 08/25/17 0720 08/26/17 0314 08/27/17 0306  NA 139 140 146*  K 4.7 4.3 4.5  CL 110 108 107  CO2 20* 27 31  BUN 16 19 24*  CREATININE 0.86 0.88  0.89 0.82  GLUCOSE 173* 229* 219*    Electrolytes Recent Labs  Lab 08/25/17 0720  08/25/17 1700 08/26/17 0314 08/27/17 0306  CALCIUM 7.8*  --   --  7.9* 6.2*  MG 1.9   < > 2.0 2.1 1.5*  PHOS 3.3   < > 2.0* 1.8* 1.4*   < > = values in this interval not displayed.    CBC Recent Labs  Lab 08/25/17 1200 08/26/17 0314 08/27/17 0306  WBC 14.6* 6.5 5.1  HGB 7.5* 7.1* 6.6*  HCT 25.1* 23.9* 21.7*  PLT 370 311 299    Coag's Recent Labs  Lab 08/24/17 0511  APTT 37*  INR 1.23    Sepsis Markers Recent Labs  Lab 08/24/17 0511 08/24/17 0930 08/26/17 0314  LATICACIDVEN 2.8* 1.7 2.0*  PROCALCITON 6.34  --  19.30    ABG Recent Labs  Lab 08/26/17 0535 08/26/17 1226 08/27/17 0325  PHART 7.349* 7.473* 7.404  PCO2ART 53.2* 41.5 50.9*  PO2ART 141* 192* 106    Liver Enzymes Recent Labs  Lab 08/21/17 0430 08/24/17 0511  AST  21 48*  ALT 21 42  ALKPHOS 78 76  BILITOT 0.4 0.7  ALBUMIN 3.2* 3.1*    Cardiac Enzymes Recent Labs  Lab 08/24/17 0930 08/24/17 1536 08/26/17 0314  TROPONINI <0.03 <0.03 0.19*    Glucose Recent Labs  Lab 08/26/17 0322 08/26/17 1530 08/26/17 1936 08/26/17 2325 08/27/17 0334 08/27/17 0754  GLUCAP 184* 198* 180* 215* 203* 178*    Imaging Dg Chest Port 1 View  Result Date: 08/27/2017 CLINICAL DATA:  Respiratory failure EXAM: PORTABLE CHEST 1 VIEW COMPARISON:  08/26/2017 FINDINGS: Support devices are stable. Cardiomegaly. Bilateral diffuse airspace opacities, likely edema/CHF. Suspect layering effusions. No real change since prior study. IMPRESSION: Probable edema/CHF and layering effusions.  No real change. Electronically Signed   By: Rolm Baptise M.D.   On: 08/27/2017 07:13      STUDIES:    CULTURES: BCx2 3/2 >>  Strep Pneumo Antigen 3/3 >> negative  RVP 3/3 >> influenza B positive  Tracheal Aspirate 3/5 >>   ANTIBIOTICS: Zosyn 3/2 >>  Vanco 3/2 >> 3/4  SIGNIFICANT EVENTS: 3/02  Admit with SOB, vomited on BiPAP > intubated, ARDS  3/03  Placed on paralytics, requiring pressors 3/04  Remains on paralytics, low dose neo.  Bicarb gtt stopped    LINES/TUBES: ETT 3/2 >>  L IJ TLC 3/3 >>  DISCUSSION:  52 y/o F, smoker, admitted with influenza B & respiratory distress.  Vomited 3/2 and developed ARDS in the setting of flu and suspected aspiration while on BiPAP.  Paralyzed 3/3.    ASSESSMENT / PLAN:  PULMONARY A: ARDS - in setting of influenza B + PNA Aspiration PNA  Recent PNA (NOS) Tobacco Abuse  Prior Intubation - 3x per husband, required paralytics P:   PRVC 6cc/kg, R35  Low tidal volume ventilation  Follow CXR  Continue sedation > discussed sedation goal with RN with transition off paralytics Discontinue paralytics  Lasix 40 mg IV BID x2 doses   CARDIOVASCULAR A:  Shock - multifactorial in setting of septic + sedation  Hx Valvular Disease - per husband had a valve surgery at [facility] P:  ICU monitoring  Wean Neo for MAP >65 Trend CVP Lasix as above  RENAL A:   Hypophosphatemia  Mixed Acidosis  P:   Trend BMP / urinary output Replace electrolytes as indicated Avoid nephrotoxic agents, ensure adequate renal perfusion KVO IVF Lasix as above  GASTROINTESTINAL A:   Abdominal Pain - on admit  Vomiting - 3/2 P:   NPO / OGT  TF per Nutrition  PPI for SUP  Senokot-S for bowel regimen  Assess KUB > anemia, lovenox, mild distention / ? constipation  HEMATOLOGIC A:   Anemia - suspect of critical illness + phlebotomy  P:  Lovenox for DVT prophylaxis  Trend CBC PRBC x1 unit 3/5  INFECTIOUS A:   Influenza B PNA - NOS Fever - began 3/2, improved overall but remains febrile 3/5 P:   Follow cultures as above Continue  Zosyn  Trend PCT  Tamiflu   ENDOCRINE A:   DM   P:   SSI, moderate scale  Increase meal coverage to 4 units Q4  Increase Lantus to 18 units QHS   NEUROLOGIC A:   Hx Anxiety, Depression, Panic Attacks, Benzo Dependent  P:   RASS goal: -4  Versed, Fentanyl for sedation  Discontinue Nimbex gtt > allow to clear then begin decreasing sedation / RASS goal  Passive ROM  Early PT efforts    FAMILY  - Updates: Husband  updated 3/4.  Will update on arrival 3/5.    - Inter-disciplinary family meet or Palliative Care meeting due by:  3/9   CC Time: 43 minutes   Noe Gens, NP-C  Pulmonary & Critical Care Pgr: [phone] or if no answer [phone] 08/27/2017, 8:27 AM

## 2017-08-27 NOTE — Progress Notes (Signed)
Assessed patient at bedside off paralytics.  RASS score of -2 to -3.  She required increase in PEEP/FiO2 from 40% / 10 PEEP to 50% / 12 PEEP off paralytics.  Reviewed with bedside RN to keep goal RASS of -2 to -3 overnight + ventilator compliance.  If increasing O2 needs, please call PCCM.     Noe Gens, NP-C [place] [facility] Pgr: [phone] or if no answer [phone] 08/27/2017, 2:57 PM

## 2017-08-28 ENCOUNTER — Encounter (HOSPITAL_COMMUNITY): Payer: Self-pay

## 2017-08-28 ENCOUNTER — Inpatient Hospital Stay (HOSPITAL_COMMUNITY): Payer: Medicare PPO

## 2017-08-28 LAB — GLUCOSE, CAPILLARY
GLUCOSE-CAPILLARY: 161 mg/dL — AB (ref 65–99)
GLUCOSE-CAPILLARY: 167 mg/dL — AB (ref 65–99)
GLUCOSE-CAPILLARY: 172 mg/dL — AB (ref 65–99)
GLUCOSE-CAPILLARY: 189 mg/dL — AB (ref 65–99)
Glucose-Capillary: 184 mg/dL — ABNORMAL HIGH (ref 65–99)
Glucose-Capillary: 158 mg/dL — ABNORMAL HIGH (ref 65–99)
Glucose-Capillary: 144 mg/dL — ABNORMAL HIGH (ref 65–99)
Glucose-Capillary: 168 mg/dL — ABNORMAL HIGH (ref 65–99)

## 2017-08-28 LAB — TYPE AND SCREEN
ABO/RH(D): O POS
Antibody Screen: NEGATIVE
Unit division: 0

## 2017-08-28 LAB — BASIC METABOLIC PANEL
Anion gap: 9 (ref 5–15)
BUN: 28 mg/dL — AB (ref 6–20)
CO2: 31 mmol/L (ref 22–32)
Calcium: 7.7 mg/dL — ABNORMAL LOW (ref 8.9–10.3)
Chloride: 106 mmol/L (ref 101–111)
Creatinine, Ser: 0.96 mg/dL (ref 0.44–1.00)
GFR calc Af Amer: >60 mL/min (ref >60)
GLUCOSE: 176 mg/dL — AB (ref 65–99)
Potassium: 3.9 mmol/L (ref 3.5–5.1)
Sodium: 146 mmol/L — ABNORMAL HIGH (ref 135–145)

## 2017-08-28 LAB — BPAM RBC
BLOOD PRODUCT EXPIRATION DATE: 201903312359
ISSUE DATE / TIME: 201903050551
Unit Type and Rh: 5100

## 2017-08-28 LAB — CBC
HCT: 25.4 % — ABNORMAL LOW (ref 36.0–46.0)
Hemoglobin: 7.9 g/dL — ABNORMAL LOW (ref 12.0–15.0)
MCH: 29 pg (ref 26.0–34.0)
MCHC: 31.1 g/dL (ref 30.0–36.0)
MCV: 93.4 fL (ref 78.0–100.0)
Platelets: 442 10*3/uL — ABNORMAL HIGH (ref 150–400)
RBC: 2.72 MIL/uL — ABNORMAL LOW (ref 3.87–5.11)
RDW: 16.5 % — AB (ref 11.5–15.5)
WBC: 7.7 10*3/uL (ref 4.0–10.5)

## 2017-08-28 LAB — PROCALCITONIN: Procalcitonin: 9.23 ng/mL

## 2017-08-28 LAB — PHOSPHORUS: Phosphorus: 2.5 mg/dL (ref 2.5–4.6)

## 2017-08-28 LAB — MAGNESIUM: Magnesium: 2.1 mg/dL (ref 1.7–2.4)

## 2017-08-28 NOTE — Progress Notes (Signed)
PULMONARY / CRITICAL CARE MEDICINE   Name: Holly Figueroa MRN: [id number] DOB: September 27, 1965    ADMISSION DATE:  08/24/2017 CONSULTATION DATE:  08/24/17  REFERRING MD:  Dr. Tamala Julian / [facility]   CHIEF COMPLAINT:  SOB  BRIEF SUMMARY:  52 y/o F, smoker, admitted 3/2 with complaints of SOB.  Initially admitted per [facility]. PCCM consulted for increasing respiratory distress on BiPAP.  Per history, the patient was discharged 2-3 days prior from [facility] where she was treated for PNA.  Despite antibiotics, she felt she was not getting better.  She developed increasing SOB, headache and lower abdominal pain.  On presentation, she was found to need 4-6L O2.  Despite supplementation, she required BiPAP which initially improved her symptoms. She was observed on BiPAP.  During the evening of 3/2, the patient vomited and required intubation.  She developed severe ARDS and was placed on paralytics on 3/3.  She was also febrile to 104.   SUBJECTIVE:  Remains +7L for admit, 2.3L UOP with lasix in last 24 hours. Tmax 100.8.  Neo weaned to 50 mcg.  No BM.  PEEP 10, 60% FiO2.   VITAL SIGNS: BP 104/67   Pulse 80   Temp (!) 100.8 F (38.2 C)   Resp (!) 31   Ht _0  (1.676 m)   Wt 197 lb 12 oz (89.7 kg)   SpO2 94%   BMI 31.92 kg/m   HEMODYNAMICS:    VENTILATOR SETTINGS: Vent Mode: PRVC FiO2 (%):  [40 %-60 %] 60 % Set Rate:  [35 bmp] 35 bmp Vt Set:  [360 mL-3600 mL] 3600 mL PEEP:  [10 cmH20-12 cmH20] 10 cmH20 Plateau Pressure:  [20 cmH20-29 cmH20] 29 cmH20  INTAKE / OUTPUT: I/O last 3 completed shifts: In: 4315.8 [I.V.:2080.8; NG/GT:1665; IV Piggyback:570] Out: 2950 [Urine:2950]  PHYSICAL EXAMINATION: General: critically ill appearing female on vent, NAD  HEENT: MM pink/moist, ETT Neuro: opens eyes to voice, attempts to wiggle toes / follow commands  CV: s1s2 rrr, ? Murmur (difficult to hear with breath sounds) PULM: even/non-labored, lungs bilaterally coarse rhonchi with wheezing  VH:QION,  non-tender, bsx4 active  Extremities: warm/dry, trace generalized edema  Skin: no rashes or lesions  LABS:  BMET Recent Labs  Lab 08/26/17 0314 08/27/17 0306 08/28/17 0315  NA 140 146* 146*  K 4.3 4.5 3.9  CL 108 107 106  CO2 _1 BUN 19 24* 28*  CREATININE 0.88  0.89 0.82 0.96  GLUCOSE 229* 219* 176*    Electrolytes Recent Labs  Lab 08/26/17 0314 08/27/17 0306 08/28/17 0315  CALCIUM 7.9* 6.2* 7.7*  MG 2.1 1.5* 2.1  PHOS 1.8* 1.4* 2.5    CBC Recent Labs  Lab 08/26/17 0314 08/27/17 0306 08/27/17 1117 08/28/17 0315  WBC 6.5 5.1  --  7.7  HGB 7.1* 6.6* 10.1* 7.9*  HCT 23.9* 21.7* 33.3* 25.4*  PLT 311 299  --  442*    Coag's Recent Labs  Lab 08/24/17 0511  APTT 37*  INR 1.23    Sepsis Markers Recent Labs  Lab 08/24/17 0511 08/24/17 0930 08/26/17 0314 08/28/17 0315  LATICACIDVEN 2.8* 1.7 2.0*  --   PROCALCITON 6.34  --  19.30 9.23    ABG Recent Labs  Lab 08/26/17 0535 08/26/17 1226 08/27/17 0325  PHART 7.349* 7.473* 7.404  PCO2ART 53.2* 41.5 50.9*  PO2ART 141* 192* 106    Liver Enzymes Recent Labs  Lab 08/24/17 0511  AST 48*  ALT 42  ALKPHOS 76  BILITOT 0.7  ALBUMIN 3.1*    Cardiac Enzymes Recent Labs  Lab 08/24/17 0930 08/24/17 1536 08/26/17 0314  TROPONINI <0.03 <0.03 0.19*    Glucose Recent Labs  Lab 08/27/17 1147 08/27/17 1545 08/27/17 1948 08/27/17 2316 08/28/17 0035 08/28/17 0313  GLUCAP 215* 252* 182* 184* 189* 158*    Imaging Dg Chest Port 1 View  Result Date: 08/28/2017 CLINICAL DATA:  ARDS EXAM: PORTABLE CHEST 1 VIEW COMPARISON:  Yesterday FINDINGS: Endotracheal tube tip at the clavicular heads. An orogastric tube at least reaches the stomach. Left IJ line with tip at the upper cavoatrial junction. Low volume chest with diffuse airspace opacity. Cardiopericardial enlargement accentuated by rotation. IMPRESSION: 1. Stable hardware positioning. 2. History of ARDS with unchanged diffuse airspace  disease. Electronically Signed   By: Monte Fantasia M.D.   On: 08/28/2017 07:04   Dg Abd Portable 1v  Result Date: 08/27/2017 CLINICAL DATA:  Abdominal distention EXAM: PORTABLE ABDOMEN - 1 VIEW COMPARISON:  Abdomen film of 08/24/2017 FINDINGS: The tip of the feeding tube overlies the mid proximal body of the stomach. The bowel gas pattern is nonspecific. No opaque calculi are seen. Surgical clips are present from prior cholecystectomy. IMPRESSION: 1. Tip of NG tube overlies the mid proximal body of the stomach. 2. Nonspecific bowel gas pattern.  No bowel obstruction. Electronically Signed   By: Ivar Drape M.D.   On: 08/27/2017 11:07     STUDIES:  ECHO 3/3 >> LVEF 65-70%, normal wall motion, grade 1 diastolic dysfunction, moderate AS, mild AR, LA severely dilated, mildly increased PA pressure, trivial pericardial effusion   CULTURES: BCx2 3/2 >>  Strep Pneumo Antigen 3/3 >> negative  RVP 3/3 >> influenza B positive  Tracheal Aspirate 3/5 >>   ANTIBIOTICS: Zosyn 3/2 >>  Vanco 3/2 >> 3/4  SIGNIFICANT EVENTS: 3/02  Admit with SOB, vomited on BiPAP > intubated, ARDS  3/03  Placed on paralytics, requiring pressors 3/04  Remains on paralytics, low dose neo.  Bicarb gtt stopped  3/05  PRBC x1.  Electrolyte disturbances (mg, phos, ca). Tmax 102.2. Paralytics stopped  LINES/TUBES: ETT 3/2 >>  L IJ TLC 3/3 >>  DISCUSSION:  52 y/o F, smoker, admitted with influenza B & respiratory distress.  Vomited 3/2 and developed ARDS in the setting of flu and suspected aspiration while on BiPAP.  Paralyzed 3/3.    ASSESSMENT / PLAN:  PULMONARY A: ARDS - in setting of influenza B + PNA, prior ARDS / intubation x2  Aspiration PNA  Recent PNA (NOS) Tobacco Abuse  Prior Intubation - 3x per husband, required paralytics P:   PRVC 6cc/kg  Wean PEEP / FiO2 per ARDS protocol  Low Vt ventilation / lung protective measures  Follow CXR  Lighten RASS goal as O2 needs will allow  Smoking cessation  counseling when able  Duoneb Q6  CARDIOVASCULAR A:  Shock - multifactorial in setting of septic + sedation  Hx Valvular Disease - per husband had a valve surgery at [place]:  ICU monitoring  Trend CVP  Wean Neo for MAP >65 Cardiology consulted for valvular disease, abnormal ECHO.  Appreciate input   RENAL A:   Hypophosphatemia  Mixed Acidosis  P:   Trend BMP / urinary output Replace electrolytes as indicated Avoid nephrotoxic agents, ensure adequate renal perfusion  GASTROINTESTINAL A:   Abdominal Pain - on admit  Vomiting - 3/2 P:   NPO / OGT  TF per Nutrition  PPI for SUP  Senokot-S for bowel regimen  HEMATOLOGIC A:   Anemia - suspect of critical illness + phlebotomy  P:  Trend CBC Lovenox for DVT prophylaxis  Transfuse per ICU guidelines   INFECTIOUS A:   Influenza B PNA - NOS Fever - began 3/2, improved overall P:   Continue Zosyn Follow cultures Trend PCT  Tamiflu x 5 days Monitor fever curve   ENDOCRINE A:   DM   P:   SSI, moderate scale coverage 4 units meal coverage Q4 Lantus 18 units QHS    NEUROLOGIC A:   Hx Anxiety, Depression, Panic Attacks, Benzo Dependent  P:   RASS goal: -2 to -3 Versed / Fentanyl gtts for sedation / vent synchrony  Passive ROM  PT efforts    FAMILY  - Updates: Husband updated 3/6 at bedside.    - Inter-disciplinary family meet or Palliative Care meeting due by:  3/9   CC Time: 27 minutes   Noe Gens, NP-C [place] [facility] Pgr: [phone] or if no answer [phone] 08/28/2017, 9:21 AM

## 2017-08-28 NOTE — Progress Notes (Signed)
Nutrition Follow-up  DOCUMENTATION CODES:   Obesity unspecified  INTERVENTION:  Continue Vital High Protein @ 45 mL/hr with 30 mL Prostat BID.  NUTRITION DIAGNOSIS:   Inadequate oral intake related to inability to eat as evidenced by NPO status. -ongoing  GOAL:   Provide needs based on ASPEN/SCCM guidelines -meeting with TF.   MONITOR:   Vent status, TF tolerance, Weight trends, Labs  ASSESSMENT:   52 y.o. female with medical history significant of mitral stenosis, DM type II, CHF last EF 65-70% with grade 1 diastolic dysfunction  Weight trending up since admission (+17 lbs/7.9 kg). Pt remains intubated with NGT in place. Abdominal x-ray results from yesterday state "tip of NG tube overlies the mid-proximal body of the stomach." Pt ordered Vital High Protein @ 45 mL/hr with 30 mL Prostat BID which provides 1280 kcal, 124 grams of protein, and 903 mL free water. TF off at time of RD visit. RN reports that this is d/t bottle of TF running low and her being out of the room with another pt and not wanting TF to run out before she had a chance to change the bottle. No issues with TF tolerance. RN confirms Vital High Protein running @ 45 mL/hr prior to pausing infusion.   Per Brandi's, PCCM NP, note yesterday, pt on ARDS protocol, septic shock and may require TEE when stable, flu B positive and PNA, low Hgb with no source of bleeding, identified, mixed acidosis, mild abdominal distention with question of constipation (no BM since 3/1).   Patient is currently intubated on ventilator support MV: 12.3 L/min Temp (24hrs), Avg:101.8 F (38.8 C), Min:100.8 F (38.2 C), Max:102.9 F (39.4 C) Propofol: none BP: 151/59 and MAP: 88  Medications reviewed; 40 mg IV Lasix BID, sliding scale Novolog, 4 units Novolog every 4 hours, 18 units Lantus/day, 2 g IV Mg sulfate x1 run yesterday, 40 mg IV Protonix BID, 2 tablets Senokot/day, 20 mmol GlucoPhos x1 run yesterday.  Labs reviewed; CBGs: 189,  158, and 172 mg/dL today, Na: 146 mmol/L, BUN: 28 mg/dL, Ca: 7.7 mg/dL.  Drips: Neo @ 40 mcg/min, Versed @ 3 mg/hr, Fentanyl @ 200 mcg/hr.      Diet Order:  Diet NPO time specified  EDUCATION NEEDS:   No education needs have been identified at this time  Skin:  Skin Assessment: Reviewed RN Assessment  Last BM:  3/1  Height:   Ht Readings from Last 1 Encounters:  08/28/17 _0  (1.676 m)    Weight:   Wt Readings from Last 1 Encounters:  08/28/17 197 lb 12 oz (89.7 kg)    Ideal Body Weight:  59.1 kg  BMI:  Body mass index is 31.92 kg/m.  Estimated Nutritional Needs:   Kcal:  1250-1450  Protein:  >/= 118 grams (2 grams/kg IBW)  Fluid:  1.5L/day       Jarome Matin, MS, RD, LDN, CNSC Inpatient Clinical Dietitian Pager # [phone] After hours/weekend pager # [phone]

## 2017-08-29 ENCOUNTER — Encounter (HOSPITAL_COMMUNITY): Payer: Medicare PPO

## 2017-08-29 ENCOUNTER — Inpatient Hospital Stay (HOSPITAL_COMMUNITY): Payer: Medicare PPO

## 2017-08-29 LAB — PROCALCITONIN: Procalcitonin: 5.47 ng/mL

## 2017-08-29 LAB — GLUCOSE, CAPILLARY
GLUCOSE-CAPILLARY: 135 mg/dL — AB (ref 65–99)
GLUCOSE-CAPILLARY: 164 mg/dL — AB (ref 65–99)
Glucose-Capillary: 165 mg/dL — ABNORMAL HIGH (ref 65–99)
Glucose-Capillary: 158 mg/dL — ABNORMAL HIGH (ref 65–99)

## 2017-08-29 LAB — CBC
HCT: 25.3 % — ABNORMAL LOW (ref 36.0–46.0)
Hemoglobin: 7.4 g/dL — ABNORMAL LOW (ref 12.0–15.0)
MCH: 28.2 pg (ref 26.0–34.0)
MCHC: 29.2 g/dL — ABNORMAL LOW (ref 30.0–36.0)
MCV: 96.6 fL (ref 78.0–100.0)
PLATELETS: 313 10*3/uL (ref 150–400)
RBC: 2.62 MIL/uL — AB (ref 3.87–5.11)
RDW: 17.8 % — ABNORMAL HIGH (ref 11.5–15.5)
WBC: 8.7 10*3/uL (ref 4.0–10.5)

## 2017-08-29 LAB — URINALYSIS, ROUTINE W REFLEX MICROSCOPIC
BACTERIA UA: NONE SEEN
Bilirubin Urine: NEGATIVE
Glucose, UA: NEGATIVE mg/dL
Hgb urine dipstick: NEGATIVE
Ketones, ur: NEGATIVE mg/dL
Leukocytes, UA: NEGATIVE
NITRITE: NEGATIVE
Protein, ur: 30 mg/dL — AB
SPECIFIC GRAVITY, URINE: 1.026 (ref 1.005–1.030)
SQUAMOUS EPITHELIAL / LPF: NONE SEEN
pH: 5 (ref 5.0–8.0)

## 2017-08-29 LAB — BASIC METABOLIC PANEL
ANION GAP: 10 (ref 5–15)
BUN: 34 mg/dL — AB (ref 6–20)
CALCIUM: 7.9 mg/dL — AB (ref 8.9–10.3)
CO2: 30 mmol/L (ref 22–32)
Chloride: 109 mmol/L (ref 101–111)
Creatinine, Ser: 0.91 mg/dL (ref 0.44–1.00)
GFR calc Af Amer: >60 mL/min (ref >60)
GFR calc non Af Amer: >60 mL/min (ref >60)
GLUCOSE: 157 mg/dL — AB (ref 65–99)
Potassium: 3.2 mmol/L — ABNORMAL LOW (ref 3.5–5.1)
SODIUM: 149 mmol/L — AB (ref 135–145)

## 2017-08-29 LAB — CULTURE, BLOOD (ROUTINE X 2)
CULTURE: NO GROWTH
CULTURE: NO GROWTH
Special Requests: ADEQUATE
Special Requests: ADEQUATE

## 2017-08-29 MED ORDER — FUROSEMIDE 10 MG/ML IJ SOLN
40.0000 mg | Freq: Two times a day (BID) | INTRAMUSCULAR | Status: DC
  Administered 2017-08-29 – 2017-08-30 (×4): 40 mg via INTRAVENOUS
  Filled 2017-08-29 (×4): qty 4

## 2017-08-29 MED ORDER — POTASSIUM CHLORIDE 20 MEQ/15ML (10%) PO SOLN
40.0000 meq | Freq: Once | ORAL | Status: AC
  Administered 2017-08-29: 40 meq
  Filled 2017-08-29: qty 30

## 2017-08-29 MED ORDER — PIPERACILLIN-TAZOBACTAM 3.375 G IVPB
3.3750 g | Freq: Three times a day (TID) | INTRAVENOUS | Status: DC
  Administered 2017-08-29 – 2017-08-30 (×4): 3.375 g via INTRAVENOUS
  Filled 2017-08-29 (×4): qty 50

## 2017-08-29 MED ORDER — SODIUM CHLORIDE 0.9 % IV SOLN
1.0000 g | Freq: Three times a day (TID) | INTRAVENOUS | Status: DC
  Filled 2017-08-29 (×2): qty 1

## 2017-08-29 NOTE — Progress Notes (Signed)
PULMONARY / CRITICAL CARE MEDICINE   Name: Holly Figueroa MRN: [id number] DOB: 1965-07-19    ADMISSION DATE:  08/24/2017 CONSULTATION DATE:  08/24/17  REFERRING MD:  Dr. Tamala Julian / [facility]   CHIEF COMPLAINT:  SOB  BRIEF SUMMARY:  52 y/o F, smoker, admitted 3/2 with complaints of SOB.  Initially admitted per [facility]. PCCM consulted for increasing respiratory distress on BiPAP.  Per history, the patient was discharged 2-3 days prior from [facility] where she was treated for PNA.  Despite antibiotics, she felt she was not getting better.  She developed increasing SOB, headache and lower abdominal pain.  On presentation, she was found to need 4-6L O2.  Despite supplementation, she required BiPAP which initially improved her symptoms. She was observed on BiPAP.  During the evening of 3/2, the patient vomited and required intubation.  She developed severe ARDS and was placed on paralytics on 3/3.  She was also febrile to 104.   SUBJECTIVE:  Fever to 102 in last 24 hours.  WBC 8.7.  PEEP 10 / 45%.  No acute events overnight. I/O +7.8L for admit. Neo weaned off 3/6 afternoon.   VITAL SIGNS: BP 140/70   Pulse 78   Temp (!) 100.6 F (38.1 C)   Resp (!) 35   Ht _0  (1.676 m)   Wt 195 lb 8.8 oz (88.7 kg)   SpO2 93%   BMI 31.56 kg/m   HEMODYNAMICS:    VENTILATOR SETTINGS: Vent Mode: PRVC FiO2 (%):  [45 %-50 %] 50 % Set Rate:  [35 bmp] 35 bmp Vt Set:  [360 mL] 360 mL PEEP:  [10 cmH20] 10 cmH20 Plateau Pressure:  [25 cmH20-29 cmH20] 26 cmH20  INTAKE / OUTPUT: I/O last 3 completed shifts: In: 3077.6 [I.V.:1072.6; NG/GT:1805; IV Piggyback:200] Out: 1800 [Urine:1800]  PHYSICAL EXAMINATION: General: critically ill appearing female on vent HEENT: MM pink/moist, ETT Neuro: Awakens to voice, nods, falls back to sleep  CV: s1s2 rrr, no m/r/g PULM: even/non-labored, lungs bilaterally coarse  ES:LPNP, non-tender, bsx4 active  Extremities: warm/dry, 1+ generalized edema  Skin: no rashes or  lesions  LABS:  BMET Recent Labs  Lab 08/27/17 0306 08/28/17 0315 08/29/17 0525  NA 146* 146* 149*  K 4.5 3.9 3.2*  CL 107 106 109  CO2 _1 BUN 24* 28* 34*  CREATININE 0.82 0.96 0.91  GLUCOSE 219* 176* 157*    Electrolytes Recent Labs  Lab 08/26/17 0314 08/27/17 0306 08/28/17 0315 08/29/17 0525  CALCIUM 7.9* 6.2* 7.7* 7.9*  MG 2.1 1.5* 2.1  --   PHOS 1.8* 1.4* 2.5  --     CBC Recent Labs  Lab 08/27/17 0306 08/27/17 1117 08/28/17 0315 08/29/17 0457  WBC 5.1  --  7.7 8.7  HGB 6.6* 10.1* 7.9* 7.4*  HCT 21.7* 33.3* 25.4* 25.3*  PLT 299  --  442* 313    Coag's Recent Labs  Lab 08/24/17 0511  APTT 37*  INR 1.23    Sepsis Markers Recent Labs  Lab 08/24/17 0511 08/24/17 0930 08/26/17 0314 08/28/17 0315 08/29/17 0525  LATICACIDVEN 2.8* 1.7 2.0*  --   --   PROCALCITON 6.34  --  19.30 9.23 5.47    ABG Recent Labs  Lab 08/26/17 0535 08/26/17 1226 08/27/17 0325  PHART 7.349* 7.473* 7.404  PCO2ART 53.2* 41.5 50.9*  PO2ART 141* 192* 106    Liver Enzymes Recent Labs  Lab 08/24/17 0511  AST 48*  ALT 42  ALKPHOS 76  BILITOT 0.7  ALBUMIN 3.1*    Cardiac Enzymes Recent Labs  Lab 08/24/17 0930 08/24/17 1536 08/26/17 0314  TROPONINI <0.03 <0.03 0.19*    Glucose Recent Labs  Lab 08/28/17 1048 08/28/17 1313 08/28/17 1718 08/28/17 1948 08/28/17 2311 08/29/17 0332  GLUCAP 172* 167* 161* 144* 168* 135*    Imaging Dg Chest Port 1 View  Result Date: 08/29/2017 CLINICAL DATA:  Respiratory failure EXAM: PORTABLE CHEST 1 VIEW COMPARISON:  Portable chest x-ray of 08/28/2017 FINDINGS: The tip of the endotracheal tube remains approximately 2.5 cm above the carina. The lungs may be slightly better aerated although persistent diffuse airspace disease remains. Also there may be pleural effusions present. Left IJ central venous catheter is seen to the region of the right atrium. NG tube remains. IMPRESSION: 1. Tip of endotracheal tube 2.5 cm  above the carina. 2. Minimally improved aeration with persistent bilateral airspace disease and possible bilateral effusions. Electronically Signed   By: Ivar Drape M.D.   On: 08/29/2017 09:28    STUDIES:  ECHO 3/3 >> LVEF 65-70%, normal wall motion, grade 1 diastolic dysfunction, moderate AS, mild AR, LA severely dilated, mildly increased PA pressure, trivial pericardial effusion  LE Venous Duplex 3/3 >> negative   CULTURES: BCx2 3/2 >>  Strep Pneumo Antigen 3/3 >> negative  RVP 3/3 >> influenza B positive  Tracheal Aspirate 3/5 >>  BCx2 3/7 >>  UA 3/7 >>   ANTIBIOTICS: Zosyn 3/2 >> 3/7 Vanco 3/2 >> 3/4 Meropenem 3/7 >>   SIGNIFICANT EVENTS: 3/02  Admit with SOB, vomited on BiPAP > intubated, ARDS  3/03  Placed on paralytics, requiring pressors 3/04  Remains on paralytics, low dose neo.  Bicarb gtt stopped  3/05  PRBC x1.  Electrolyte disturbances (mg, phos, ca). Tmax 102.2. Paralytics stopped 3/06  7L+  for admit despite lasix. Tmax 100.8.  Neo 50 mcg.  No BM.  PEEP 10, 60% FiO2.  3/07  Ongoing fever, abx changed.  Off pressors.  LINES/TUBES: ETT 3/2 >>  L IJ TLC 3/3 >>  DISCUSSION:  52 y/o F, smoker, admitted with influenza B & respiratory distress.  Vomited 3/2 and developed ARDS in the setting of flu and suspected aspiration while on BiPAP.  Paralyzed 3/3.    ASSESSMENT / PLAN:  PULMONARY A: ARDS - in setting of influenza B + PNA, prior ARDS / intubation x2  Aspiration PNA  Recent PNA (NOS) Tobacco Abuse  Prior Intubation - 3x per husband, required paralytics P:   PRVC 6 cc/kg  Wean PEEP / FiO2 for ARDS protocol  Low Vt ventilation  Follow CXR  Lighten RASS goal as O2 needs allow > desaturated to 82 while lying flat Duoneb Q6   CARDIOVASCULAR A:  Shock - multifactorial in setting of septic + sedation  Hx Valvular Disease - per husband had a valve surgery at [facility] P:  ICU monitoring  Trend CVP  Appreciate Cardiology input > no acute interventions (TEE)  needed at this time.  Per Dr. Einar Gip, ECHO is essentially unchanged from baseline  RENAL A:   Hypophosphatemia  Hypernatremia Hypokalemia Mixed Acidosis  P:   Trend BMP / urinary output Replace electrolytes as indicated, KCL 3/7 Avoid nephrotoxic agents, ensure adequate renal perfusion  GASTROINTESTINAL A:   Abdominal Pain - on admit  Vomiting - 3/2 P:   NPO / OGT  TF per Nutrition  PPI for SUP  Senokot-S for bowel regimen    HEMATOLOGIC A:   Anemia - suspect of critical illness + phlebotomy  P:  Trend CBC  Lovenox for DVT prophylaxis  Transfuse per ICU guidelines   INFECTIOUS A:   Influenza B PNA - NOS Fever - began 3/2, ongoing  P:   Change zosyn to meropenem  Follow cultures  Trend PCT  > improving  Tamiflu for 5 days  Monitor fever curve   ENDOCRINE A:   DM   P:   SSI, moderate scale  4 units meal coverage Q4  Lantus 18 units QHS   NEUROLOGIC A:   Hx Anxiety, Depression, Panic Attacks, Benzo Dependent  P:   RASS goal: -2 to -3  Versed / Fentanyl gtt's for sedation / vent synchrony  Passive ROM  PT efforts    FAMILY  - Updates: No family at bedside am 3/7.      - Inter-disciplinary family meet or Palliative Care meeting due by:  3/9   CC Time: 15 minutes  Noe Gens, NP-C [facility] Pgr: [phone] or if no answer [phone] 08/29/2017, 10:13 AM

## 2017-08-29 NOTE — Consult Note (Signed)
Reason for Consult:Valvular heart disease Referring Physician: Noe Gens, NP-C  Holly Figueroa is an 52 y.o. female.  HPI: Patient with heart disease, history of mitral stenosis and aortic regurgitation with LVOT obstruction by prior echocardiogram, has had mitral  annuloplasty for severe mitral stenosis at [facility] on 10/23/2010.  Past medical history significant for hypertension, hyperlipidemia, chronic tobacco use disorder, diabetes mellitus.  She has recently had recurrent pneumonia and was admitted in [place] sometime in September 2018 and had to be intubated.  She is now admitted with influenza B pneumonia and ARDS.  Echocardiogram revealed significant valvular abnormality and I was asked to give opinion regarding further management.  Unable to obtain history as patient is sedated and intubated.  Past Medical History:  Diagnosis Date  . Acute respiratory failure ([place])   . Anemia 08-2008   Blood transfusion  . Anxiety   . Benzodiazepine dependence ([place])   . Cervical cancer ([place])   . CHF (congestive heart failure) ([place])   . Depression   . Diabetes mellitus without complication ([place])   . Headache(784.0)    migraines  . Hypokalemia   . Legionella pneumonia ([place])   . Leukocytosis, unspecified   . Mitral stenosis   . Panic attacks   . Tobacco abuse     Past Surgical History:  Procedure Laterality Date  . BIOPSY  06/12/2017   Procedure: BIOPSY;  Surgeon: Daneil Dolin, MD;  Location: [facility];  Service: Gastroenterology;;  esophagus  . cardiac valve repair  2011   stretched mitral valve  . CERVICAL CONIZATION W/BX  2000  . CHOLECYSTECTOMY  06/26/2012   Procedure: LAPAROSCOPIC CHOLECYSTECTOMY WITH INTRAOPERATIVE CHOLANGIOGRAM;  Surgeon: Ralene Ok, MD;  Location: [facility] ORS;  Service: General;  Laterality: N/A;  . COLONOSCOPY WITH PROPOFOL N/A 06/12/2017   Normal colon, normal TI, Grade I internal hemorrhoids  . ESOPHAGOGASTRODUODENOSCOPY  (EGD) WITH PROPOFOL N/A 06/12/2017   esophagitis, possibly chemical or pill-induced, s/p dilation. small hiatal hernia, normal duodenal bulb and D 2  . GIVENS CAPSULE STUDY N/A 07/19/2017   incomplete capsule study as did not reach cecum.   Marland Kitchen LASER ABLATION OF THE CERVIX    . MALONEY DILATION  06/12/2017   Procedure: MALONEY DILATION;  Surgeon: Daneil Dolin, MD;  Location: [facility];  Service: Gastroenterology;;  . MOLE REMOVAL    . NASAL SINUS SURGERY      Family History  Problem Relation Age of Onset  . Heart disease Father   . COPD Father   . Heart failure Maternal Grandmother   . Stroke Mother   . Colon cancer Brother 46       deceased   . Colon polyps Neg Hx     Social History:  reports that she has been smoking cigarettes.  She has a 23.00 pack-year smoking history. she has never used smokeless tobacco. She reports that she does not drink alcohol or use drugs.  Allergies:  Allergies  Allergen Reactions  . Iodinated Diagnostic Agents Anaphylaxis  . Aspirin Other (See Comments)    "Possible blood in stool" the 327m. Can take 835m  . Ibuprofen Other (See Comments)    "Possible blood in stool"  . Sulfa Antibiotics Hives  . Zofran [Ondansetron Hcl]     Bad headaches  . Dilaudid [Hydromorphone Hcl] Itching and Palpitations  . Hydrocodone Palpitations    Results for orders placed or performed during the hospital encounter of 08/24/17 (from the past 48 hour(s))  Culture,  respiratory (NON-Expectorated)     Status: None (Preliminary result)   Collection Time: 08/27/17  8:59 AM  Result Value Ref Range   Specimen Description      TRACHEAL ASPIRATE Performed at [place] [address] [place], [place] [address]    Special Requests      NONE Performed at [facility], [place] [address], [place], [place] [address]    Gram Stain      FEW WBC PRESENT, PREDOMINANTLY PMN NO ORGANISMS SEEN    Culture      CULTURE REINCUBATED  FOR BETTER GROWTH Performed at [facility], [place] [address] [place], [place] [address]    Report Status PENDING   Pregnancy, urine     Status: None   Collection Time: 08/27/17  9:43 AM  Result Value Ref Range   Preg Test, Ur NEGATIVE NEGATIVE    Comment:        THE SENSITIVITY OF THIS METHODOLOGY IS >20 mIU/mL. Performed at [facility], [place] [address], [place], [place] [address]   Hemoglobin and hematocrit, blood     Status: Abnormal   Collection Time: 08/27/17 11:17 AM  Result Value Ref Range   Hemoglobin 10.1 (L) 12.0 - 15.0 g/dL    Comment: REPEATED TO VERIFY DELTA CHECK NOTED POST TRANSFUSION SPECIMEN    HCT 33.3 (L) 36.0 - 46.0 %    Comment: Performed at [facility], [place] [address] [place], [place] [address]  Glucose, capillary     Status: Abnormal   Collection Time: 08/27/17 11:47 AM  Result Value Ref Range   Glucose-Capillary 215 (H) 65 - 99 mg/dL   Comment 1 Notify RN    Comment 2 Document in Chart   Glucose, capillary     Status: Abnormal   Collection Time: 08/27/17  3:45 PM  Result Value Ref Range   Glucose-Capillary 252 (H) 65 - 99 mg/dL  Glucose, capillary     Status: Abnormal   Collection Time: 08/27/17  7:48 PM  Result Value Ref Range   Glucose-Capillary 182 (H) 65 - 99 mg/dL   Comment 1 Notify RN   Glucose, capillary     Status: Abnormal   Collection Time: 08/27/17 11:16 PM  Result Value Ref Range   Glucose-Capillary 184 (H) 65 - 99 mg/dL   Comment 1 Notify RN   Glucose, capillary     Status: Abnormal   Collection Time: 08/28/17 12:35 AM  Result Value Ref Range   Glucose-Capillary 189 (H) 65 - 99 mg/dL  Glucose, capillary     Status: Abnormal   Collection Time: 08/28/17  3:13 AM  Result Value Ref Range   Glucose-Capillary 158 (H) 65 - 99 mg/dL   Comment 1 Notify RN   Magnesium     Status: None   Collection Time: 08/28/17  3:15 AM  Result Value Ref Range   Magnesium 2.1 1.7 - 2.4 mg/dL    Comment:  Performed at [facility], [place] [address], [place], [place] [address]  Phosphorus     Status: None   Collection Time: 08/28/17  3:15 AM  Result Value Ref Range   Phosphorus 2.5 2.5 - 4.6 mg/dL    Comment: Performed at [facility], [place] [address], [place],  [address]  CBC     Status: Abnormal   Collection Time: 08/28/17  3:15 AM  Result Value Ref Range   WBC 7.7 4.0 - 10.5 K/uL  RBC 2.72 (L) 3.87 - 5.11 MIL/uL   Hemoglobin 7.9 (L) 12.0 - 15.0 g/dL    Comment: DELTA CHECK NOTED REPEATED TO VERIFY    HCT 25.4 (L) 36.0 - 46.0 %   MCV 93.4 78.0 - 100.0 fL   MCH 29.0 26.0 - 34.0 pg   MCHC 31.1 30.0 - 36.0 g/dL   RDW 16.5 (H) 11.5 - 15.5 %   Platelets 442 (H) 150 - 400 K/uL    Comment: Performed at [facility], [place] [address] [place], [place] [address]  Basic metabolic panel     Status: Abnormal   Collection Time: 08/28/17  3:15 AM  Result Value Ref Range   Sodium 146 (H) 135 - 145 mmol/L   Potassium 3.9 3.5 - 5.1 mmol/L   Chloride 106 101 - 111 mmol/L   CO2 31 22 - 32 mmol/L   Glucose, Bld 176 (H) 65 - 99 mg/dL   BUN 28 (H) 6 - 20 mg/dL   Creatinine, Ser 0.96 0.44 - 1.00 mg/dL   Calcium 7.7 (L) 8.9 - 10.3 mg/dL   GFR calc non Af Amer >60 >60 mL/min   GFR calc Af Amer >60 >60 mL/min    Comment: (NOTE) The eGFR has been calculated using the CKD EPI equation. This calculation has not been validated in all clinical situations. eGFR's persistently <60 mL/min signify possible Chronic Kidney Disease.    Anion gap 9 5 - 15    Comment: Performed at [facility], [place] [address] [place], [place] [address]  Procalcitonin     Status: None   Collection Time: 08/28/17  3:15 AM  Result Value Ref Range   Procalcitonin 9.23 ng/mL    Comment:        Interpretation: PCT > 2 ng/mL: Systemic infection (sepsis) is likely, unless other causes are known. (NOTE)       Sepsis PCT Algorithm           Lower  Respiratory Tract                                      Infection PCT Algorithm    ----------------------------     ----------------------------         PCT < 0.25 ng/mL                PCT < 0.10 ng/mL         Strongly encourage             Strongly discourage   discontinuation of antibiotics    initiation of antibiotics    ----------------------------     -----------------------------       PCT 0.25 - 0.50 ng/mL            PCT 0.10 - 0.25 ng/mL               OR       >80% decrease in PCT            Discourage initiation of                                            antibiotics      Encourage discontinuation           of antibiotics    ----------------------------     -----------------------------  PCT >= 0.50 ng/mL              PCT 0.26 - 0.50 ng/mL               AND       <80% decrease in PCT              Encourage initiation of                                             antibiotics       Encourage continuation           of antibiotics    ----------------------------     -----------------------------        PCT >= 0.50 ng/mL                  PCT > 0.50 ng/mL               AND         increase in PCT                  Strongly encourage                                      initiation of antibiotics    Strongly encourage escalation           of antibiotics                                     -----------------------------                                           PCT <= 0.25 ng/mL                                                 OR                                        > 80% decrease in PCT                                     Discontinue / Do not initiate                                             antibiotics Performed at [place] [address] [place], [place] [address]   Glucose, capillary     Status: Abnormal   Collection Time: 08/28/17 10:48 AM  Result Value Ref Range   Glucose-Capillary 172 (H) 65 - 99 mg/dL  Glucose, capillary     Status:  Abnormal   Collection Time: 08/28/17  1:13 PM  Result Value Ref  Range   Glucose-Capillary 167 (H) 65 - 99 mg/dL   Comment 1 Notify RN    Comment 2 Document in Chart   Glucose, capillary     Status: Abnormal   Collection Time: 08/28/17  5:18 PM  Result Value Ref Range   Glucose-Capillary 161 (H) 65 - 99 mg/dL   Comment 1 Notify RN    Comment 2 Document in Chart   Glucose, capillary     Status: Abnormal   Collection Time: 08/28/17  7:48 PM  Result Value Ref Range   Glucose-Capillary 144 (H) 65 - 99 mg/dL  Glucose, capillary     Status: Abnormal   Collection Time: 08/28/17 11:11 PM  Result Value Ref Range   Glucose-Capillary 168 (H) 65 - 99 mg/dL  Glucose, capillary     Status: Abnormal   Collection Time: 08/29/17  3:32 AM  Result Value Ref Range   Glucose-Capillary 135 (H) 65 - 99 mg/dL  CBC     Status: Abnormal   Collection Time: 08/29/17  4:57 AM  Result Value Ref Range   WBC 8.7 4.0 - 10.5 K/uL   RBC 2.62 (L) 3.87 - 5.11 MIL/uL   Hemoglobin 7.4 (L) 12.0 - 15.0 g/dL   HCT 25.3 (L) 36.0 - 46.0 %   MCV 96.6 78.0 - 100.0 fL   MCH 28.2 26.0 - 34.0 pg   MCHC 29.2 (L) 30.0 - 36.0 g/dL   RDW 17.8 (H) 11.5 - 15.5 %   Platelets 313 150 - 400 K/uL    Comment: Performed at [facility], [place] [address] [place], [place] [address]  Basic metabolic panel     Status: Abnormal   Collection Time: 08/29/17  5:25 AM  Result Value Ref Range   Sodium 149 (H) 135 - 145 mmol/L   Potassium 3.2 (L) 3.5 - 5.1 mmol/L    Comment: DELTA CHECK NOTED REPEATED TO VERIFY    Chloride 109 101 - 111 mmol/L   CO2 30 22 - 32 mmol/L   Glucose, Bld 157 (H) 65 - 99 mg/dL   BUN 34 (H) 6 - 20 mg/dL   Creatinine, Ser 0.91 0.44 - 1.00 mg/dL   Calcium 7.9 (L) 8.9 - 10.3 mg/dL   GFR calc non Af Amer >60 >60 mL/min   GFR calc Af Amer >60 >60 mL/min    Comment: (NOTE) The eGFR has been calculated using the CKD EPI equation. This calculation has not been validated in all clinical  situations. eGFR's persistently <60 mL/min signify possible Chronic Kidney Disease.    Anion gap 10 5 - 15    Comment: Performed at [facility], [place] [address] [place], [place] [address]  Procalcitonin     Status: None   Collection Time: 08/29/17  5:25 AM  Result Value Ref Range   Procalcitonin 5.47 ng/mL    Comment:        Interpretation: PCT > 2 ng/mL: Systemic infection (sepsis) is likely, unless other causes are known. (NOTE)       Sepsis PCT Algorithm           Lower Respiratory Tract                                      Infection PCT Algorithm    ----------------------------     ----------------------------         PCT < 0.25 ng/mL  PCT < 0.10 ng/mL         Strongly encourage             Strongly discourage   discontinuation of antibiotics    initiation of antibiotics    ----------------------------     -----------------------------       PCT 0.25 - 0.50 ng/mL            PCT 0.10 - 0.25 ng/mL               OR       >80% decrease in PCT            Discourage initiation of                                            antibiotics      Encourage discontinuation           of antibiotics    ----------------------------     -----------------------------         PCT >= 0.50 ng/mL              PCT 0.26 - 0.50 ng/mL               AND       <80% decrease in PCT              Encourage initiation of                                             antibiotics       Encourage continuation           of antibiotics    ----------------------------     -----------------------------        PCT >= 0.50 ng/mL                  PCT > 0.50 ng/mL               AND         increase in PCT                  Strongly encourage                                      initiation of antibiotics    Strongly encourage escalation           of antibiotics                                     -----------------------------                                           PCT <= 0.25  ng/mL                                                 OR                                        >  80% decrease in PCT                                     Discontinue / Do not initiate                                             antibiotics Performed at [place] [address] [place], [place] [address]     Dg Chest Port 1 View  Result Date: 08/28/2017 CLINICAL DATA:  ARDS EXAM: PORTABLE CHEST 1 VIEW COMPARISON:  Yesterday FINDINGS: Endotracheal tube tip at the clavicular heads. An orogastric tube at least reaches the stomach. Left IJ line with tip at the upper cavoatrial junction. Low volume chest with diffuse airspace opacity. Cardiopericardial enlargement accentuated by rotation. IMPRESSION: 1. Stable hardware positioning. 2. History of ARDS with unchanged diffuse airspace disease. Electronically Signed   By: Monte Fantasia M.D.   On: 08/28/2017 07:04   Dg Abd Portable 1v  Result Date: 08/27/2017 CLINICAL DATA:  Abdominal distention EXAM: PORTABLE ABDOMEN - 1 VIEW COMPARISON:  Abdomen film of 08/24/2017 FINDINGS: The tip of the feeding tube overlies the mid proximal body of the stomach. The bowel gas pattern is nonspecific. No opaque calculi are seen. Surgical clips are present from prior cholecystectomy. IMPRESSION: 1. Tip of NG tube overlies the mid proximal body of the stomach. 2. Nonspecific bowel gas pattern.  No bowel obstruction. Electronically Signed   By: Ivar Drape M.D.   On: 08/27/2017 11:07    Review of Systems  Unable to perform ROS: Critical illness     Cardiac studies:  Echocardiogram 08/25/2017: - Vigorous LV systolic function; mild LVH; mild diastolic  dysfunction; aortic valve not well visualized but mean gradient  of 33 mmHg suggests moderate AS; however, there may be a  contribution from systolic anterior motion of MV causing elevated  LVOT gradient (as well as vigorous LV function); mild AI; by  report, pt with prior MV repair; mean gradient of  12 mmHg suggest  severe MS but valve by pressure half time 2.5 cm2; severe LAE;  trace TR with mildly elevated pulmonary pressure. suggest TEE to  further assess.  Compared to 08/20/2017, no change in aortic gradient, mitral gradient has progressed, previously mild.  Suspect this is due to left ventricle being hyperdynamic in the latest study.  EKG 08/25/2017: Sinus tachycardia at the rate of 100 bpm, normal axis, no evidence of ischemia.  Normal EKG.  Blood pressure (!) 144/69, pulse 88, temperature (!) 101.3 F (38.5 C), resp. rate (!) 35, height _0  (1.676 m), weight 88.7 kg (195 lb 8.8 oz), SpO2 96 %. Physical Exam  Constitutional: She appears well-developed and well-nourished.  Intubated and on ventilator  Eyes: Conjunctivae are normal.  Cardiovascular: Regular rhythm and intact distal pulses.  Murmur (2/6 SEM in  apex and mid diastolic murmur II/IV. No gallop) heard. Respiratory: She has wheezes (bilateral).  GI: Soft. Bowel sounds are normal.  Musculoskeletal:  Unable to test  Neurological:  Sedated and on ventilator  Skin: Skin is warm and dry.   Scheduled Meds: . chlorhexidine gluconate (MEDLINE KIT)  15 mL Mouth Rinse BID  . Chlorhexidine Gluconate Cloth  6 each Topical Daily  . enoxaparin (LOVENOX) injection  40 mg Subcutaneous Q24H  . feeding  supplement (PRO-STAT SUGAR FREE 64)  30 mL Per Tube BID  . insulin aspart  0-15 Units Subcutaneous Q4H  . insulin aspart  4 Units Subcutaneous Q4H  . insulin glargine  18 Units Subcutaneous QHS  . ipratropium-albuterol  3 mL Nebulization QID  . mouth rinse  15 mL Mouth Rinse QID  . oseltamivir  75 mg Per Tube BID  . pantoprazole (PROTONIX) IV  40 mg Intravenous Q12H  . senna-docusate  2 tablet Oral Daily  . sodium chloride flush  10-40 mL Intracatheter Q12H   Continuous Infusions: . feeding supplement (VITAL HIGH PROTEIN) 1,000 mL (08/29/17 0800)  . fentaNYL infusion INTRAVENOUS 200 mcg/hr (08/29/17 0800)  . lactated ringers  10 mL/hr at 08/29/17 0800  . midazolam (VERSED) infusion 3 mg/hr (08/29/17 0800)  . phenylephrine (NEO-SYNEPHRINE) Adult infusion Stopped (08/28/17 1200)  . piperacillin-tazobactam (ZOSYN)  IV 3.375 g (08/29/17 0543)   PRN Meds:.acetaminophen **OR** acetaminophen, albuterol, fentaNYL, midazolam, promethazine, sodium chloride flush   Assessment/Plan: 1.  Respiratory failure secondary to influenza B pneumonia 2.  Nontraumatic mild to moderate mitral stenosis, this appears to be stable, patient has had recent echocardiogram just a month ago.  Please see my above findings on cardiac testing. 3.  Nonrheumatic mild aortic stenosis, LVOT gradient secondary to a very long neck to the LVOT, normal variant, probably also has mild systolic anterior motion of the mitral valve leaflet. 4.  Diabetes mellitus type 2 uncontrolled with hyperglycemia 5.  Hypertension 6.  Hyperlipidemia 7.  Ongoing tobacco use disorder  Recommendation: Patient is known to have valvular heart disease and this appears to be stable both by physical exam and by echocardiogram.  Suspect the elevated gradients are due to hyperdynamic left ventricle in view of underlying sepsis and also use of vasopressors.  I also do not suspect endocarditis.  Hence does not need any TEE.  Diabetes is uncontrolled, smoking cessation has previously been discussed, will address her hyperlipidemia once she is medically stable.  Please call me if you need my assistance any further.  Adrian Prows, MD 08/29/2017, 9:15 AM [facility] [place] Pager: [phone] Office: [phone] If no answer: Cell:  [phone]

## 2017-08-29 NOTE — Progress Notes (Signed)
Ice packs applied to armpits and groin area for persistent fever in spite of Tylenol

## 2017-08-30 ENCOUNTER — Inpatient Hospital Stay (HOSPITAL_COMMUNITY): Payer: Medicare PPO

## 2017-08-30 LAB — CULTURE, RESPIRATORY W GRAM STAIN

## 2017-08-30 LAB — BODY FLUID CELL COUNT WITH DIFFERENTIAL
EOS FL: 0 %
Lymphs, Fluid: 41 %
MONOCYTE-MACROPHAGE-SEROUS FLUID: 50 % (ref 50–90)
Neutrophil Count, Fluid: 9 % (ref 0–25)
Total Nucleated Cell Count, Fluid: 126 cu mm (ref 0–1000)

## 2017-08-30 LAB — BASIC METABOLIC PANEL
Anion gap: 9 (ref 5–15)
BUN: 35 mg/dL — AB (ref 6–20)
CALCIUM: 7.9 mg/dL — AB (ref 8.9–10.3)
CO2: 31 mmol/L (ref 22–32)
Chloride: 111 mmol/L (ref 101–111)
Creatinine, Ser: 1.04 mg/dL — ABNORMAL HIGH (ref 0.44–1.00)
GFR calc Af Amer: >60 mL/min (ref >60)
Glucose, Bld: 115 mg/dL — ABNORMAL HIGH (ref 65–99)
POTASSIUM: 5 mmol/L (ref 3.5–5.1)
SODIUM: 151 mmol/L — AB (ref 135–145)

## 2017-08-30 LAB — CBC
HCT: 23.9 % — ABNORMAL LOW (ref 36.0–46.0)
HEMOGLOBIN: 7 g/dL — AB (ref 12.0–15.0)
MCH: 27.7 pg (ref 26.0–34.0)
MCHC: 29.3 g/dL — ABNORMAL LOW (ref 30.0–36.0)
MCV: 94.5 fL (ref 78.0–100.0)
PLATELETS: 361 10*3/uL (ref 150–400)
RBC: 2.53 MIL/uL — AB (ref 3.87–5.11)
RDW: 17.4 % — ABNORMAL HIGH (ref 11.5–15.5)
WBC: 9 10*3/uL (ref 4.0–10.5)

## 2017-08-30 LAB — GLUCOSE, CAPILLARY
GLUCOSE-CAPILLARY: 119 mg/dL — AB (ref 65–99)
GLUCOSE-CAPILLARY: 143 mg/dL — AB (ref 65–99)
Glucose-Capillary: 158 mg/dL — ABNORMAL HIGH (ref 65–99)
Glucose-Capillary: 190 mg/dL — ABNORMAL HIGH (ref 65–99)
Glucose-Capillary: 200 mg/dL — ABNORMAL HIGH (ref 65–99)

## 2017-08-30 LAB — PREPARE RBC (CROSSMATCH)

## 2017-08-30 LAB — CULTURE, RESPIRATORY: CULTURE: NORMAL

## 2017-08-30 LAB — MAGNESIUM: MAGNESIUM: 2.2 mg/dL (ref 1.7–2.4)

## 2017-08-30 MED ORDER — PREDNISONE 20 MG PO TABS: 50.0000 mg | ORAL_TABLET | Freq: Four times a day (QID) | ORAL | Status: DC

## 2017-08-30 MED ORDER — SENNOSIDES 8.8 MG/5ML PO SYRP
10.0000 mL | ORAL_SOLUTION | Freq: Every day | ORAL | Status: DC
  Administered 2017-08-30 – 2017-09-07 (×7): 10 mL
  Filled 2017-08-30 (×10): qty 10

## 2017-08-30 MED ORDER — MEROPENEM 1 G IV SOLR
1.0000 g | Freq: Three times a day (TID) | INTRAVENOUS | Status: DC
  Administered 2017-08-30 – 2017-09-12 (×39): 1 g via INTRAVENOUS
  Filled 2017-08-30 (×43): qty 1

## 2017-08-30 MED ORDER — SODIUM CHLORIDE 0.9 % IV SOLN: Freq: Once | INTRAVENOUS | Status: AC

## 2017-08-30 MED ORDER — VANCOMYCIN HCL 10 G IV SOLR
1250.0000 mg | INTRAVENOUS | Status: DC
  Administered 2017-08-31 – 2017-09-02 (×3): 1250 mg via INTRAVENOUS
  Filled 2017-08-30 (×4): qty 1250

## 2017-08-30 MED ORDER — DIPHENHYDRAMINE HCL 50 MG/ML IJ SOLN
50.0000 mg | Freq: Once | INTRAMUSCULAR | Status: AC
  Administered 2017-08-31: 50 mg via INTRAVENOUS
  Filled 2017-08-30: qty 1

## 2017-08-30 MED ORDER — VANCOMYCIN HCL 10 G IV SOLR
1500.0000 mg | Freq: Once | INTRAVENOUS | Status: AC
  Administered 2017-08-30: 1500 mg via INTRAVENOUS
  Filled 2017-08-30: qty 1500

## 2017-08-30 MED ORDER — PREDNISONE 20 MG PO TABS
50.0000 mg | ORAL_TABLET | Freq: Four times a day (QID) | ORAL | Status: AC
  Administered 2017-08-30 – 2017-08-31 (×3): 50 mg
  Filled 2017-08-30 (×3): qty 2

## 2017-08-30 MED ORDER — ACETAMINOPHEN 160 MG/5ML PO SOLN
650.0000 mg | Freq: Four times a day (QID) | ORAL | Status: DC | PRN
  Administered 2017-08-30 – 2017-09-17 (×13): 650 mg
  Filled 2017-08-30 (×15): qty 20.3

## 2017-08-30 MED ORDER — FAMOTIDINE 20 MG PO TABS
20.0000 mg | ORAL_TABLET | Freq: Once | ORAL | Status: AC
  Administered 2017-08-31: 20 mg via ORAL
  Filled 2017-08-30: qty 1

## 2017-08-30 MED ORDER — FREE WATER
200.0000 mL | Freq: Three times a day (TID) | Status: DC
  Administered 2017-08-30 – 2017-08-31 (×3): 200 mL

## 2017-08-30 MED ORDER — DOCUSATE SODIUM 50 MG/5ML PO LIQD
100.0000 mg | Freq: Every day | ORAL | Status: DC
Start: 2017-08-30 — End: 2017-09-13
  Administered 2017-08-30 – 2017-09-07 (×7): 100 mg
  Filled 2017-08-30 (×8): qty 10

## 2017-08-30 MED ORDER — ETOMIDATE 2 MG/ML IV SOLN
0.3000 mg/kg | Freq: Once | INTRAVENOUS | Status: AC
  Administered 2017-08-30: 20 mg via INTRAVENOUS

## 2017-08-30 MED ORDER — DIPHENHYDRAMINE HCL 50 MG PO CAPS: 50.0000 mg | ORAL_CAPSULE | Freq: Once | ORAL | Status: DC

## 2017-08-30 MED ORDER — ACETAMINOPHEN 650 MG RE SUPP: 650.0000 mg | Freq: Four times a day (QID) | RECTAL | Status: DC | PRN

## 2017-08-30 NOTE — Progress Notes (Signed)
Pharmacy Antibiotic Note  Holly Figueroa is a 52 y.o. female recently hospitalized and discharged from [facility] on 08/21/17, presented to [facility]  on 08/24/2017 with SOB and subsequently intubated.  Broad abx with vancomycin and zosyn started on admission for sepsis/PNA.  MRSA PCR neg and positive for influ B with vancomycin d/ced on 3/4 and tamiflu started on 3/4, Zosyn continued. Now with ongoing fevers, pharmacy is consulted to change from Zosyn to Meropenem and resume Vancomycin dosing.  Today, 08/30/2017: - Day # 7 antibiotics - Tmax 102.4, ongoing fevers - WBC 9 - SCr up to 1.04, CrCl ~ 72 ml/min - Per CCM request, change abx after BAL  -  PCT 19.3 (3/4) > 9.2 (3/6) > 5.4 (3.7) - All cultures with this admission have been negative thus far   Plan:  D/C Zosyn  Meropenem 1g IV q8h  Vancomycin 153m IV x1 then 1250 IV q24h.  Measure Vanc peak, trough at steady state.  Follow up renal fxn, culture results, and clinical course.   _____________________________________  Height: _0  (167.6 cm) Weight: 193 lb 12.6 oz (87.9 kg) IBW/kg (Calculated) : 59.3  Temp (24hrs), Avg:101.5 F (38.6 C), Min:100.6 F (38.1 C), Max:102.4 F (39.1 C)  Recent Labs  Lab 08/24/17 0511 08/24/17 0930  08/26/17 0314 08/27/17 0306 08/28/17 0315 08/29/17 0457 08/29/17 0525 08/30/17 0553  WBC 24.9*  --    < > 6.5 5.1 7.7 8.7  --  9.0  CREATININE 0.82  --    < > 0.88  0.89 0.82 0.96  --  0.91 1.04*  LATICACIDVEN 2.8* 1.7  --  2.0*  --   --   --   --   --    < > = values in this interval not displayed.    Estimated Creatinine Clearance: 72.2 mL/min (A) (by C-G formula based on SCr of 1.04 mg/dL (H)).    Allergies  Allergen Reactions  . Iodinated Diagnostic Agents Anaphylaxis  . Aspirin Other (See Comments)    "Possible blood in stool" the 3239m Can take 8199m . Ibuprofen Other (See Comments)    "Possible blood in stool"  . Sulfa Antibiotics Hives  . Zofran [Ondansetron Hcl]     Bad  headaches  . Dilaudid [Hydromorphone Hcl] Itching and Palpitations  . Hydrocodone Palpitations    Antimicrobials this admission:  3/2 Vancomycin >>3/4, resume >>  3/2 Zosyn  >> 3/8 3/4 tamiflu>> (3/9) 3/8 meropenem >>   Dose adjustments this admission:  --   Microbiology results:  2/25 ucx: >100K klebsiella pneu (R= amp, I= unasyn, nitro)  3/2 BCx x2: NGF 3/2 MRSA PCR: negative 3/3 resp panel pcr: Influ B+ 3/5 TA: normal flora 3/3 ur legionella: neg 3/7 BCx: 3/8 BAL:   Thank you for allowing pharmacy to be a part of this patient's care.  ChrGretta ArabarmD, BCPS Pager [phone]/2019 11:31 AM

## 2017-08-30 NOTE — Progress Notes (Signed)
Patient ID: Holly Figueroa, female   DOB: 03/27/1966, 53 y.o.   MRN: [id number] Patient scheduled for CT angio of chest on 3/9; noted to have contrast allergy.  Orders in place for premedication prior to contrast administration.  Above discussed with patient's nurse.

## 2017-08-30 NOTE — Procedures (Signed)
PCCM Video Bronchoscopy Procedure Note  The patient was informed of the risks (including but not limited to bleeding, infection, respiratory failure, lung injury, tooth/oral injury) and benefits of the procedure and gave consent, see chart.  Indication: Respiratory failure, ARDS  Post Procedure Diagnosis: Pulmonary hemorrhage, right upper lobe   Condition pre procedure: Stable  Medications for procedure: Patient was on the vent, Versed, fentanyl drip, 20 mg etomidate  Procedure description: The bronchoscope was introduced through the endotracheal tube and passed to the bilateral lungs to the level of the subsegmental bronchi throughout the tracheobronchial tree.  Airway exam revealed blood and active bleeding noted from the right side.  This was suctioned away with aliquots of saline. There is ongoing active bleeding from right upper lobe anterior subsegment [see picture] BAL performed in the right middle lobe with 60 cc x 3.  There is no evidence of alveolar hemorrhage.  Procedures performed: BAL RML  Specimens sent: Culture, cytology  Condition post procedure:Stable   Right upper lobe, bronchus intermedius   Right upper lobe   Right upper lobe   Marshell Garfinkel MD [facility] Pager [phone] If no answer or after 3pm call: [phone] 08/30/2017, 1:25 PM

## 2017-08-30 NOTE — Progress Notes (Signed)
Patient vent alarmed and sats dropped to 82% with good wave form switched to 100% O2 called respiratory to bedside the patient began coughing prior to any suctioning and frank red blood filled the ET tube. Respiratory suctioned and replaced tubing. Approx 50 to 75cc. Contacted [facility] and they camera in, no orders received.

## 2017-08-30 NOTE — Progress Notes (Addendum)
PULMONARY / CRITICAL CARE MEDICINE   Name: Holly Figueroa MRN: [id number] DOB: Oct 17, 1965    ADMISSION DATE:  08/24/2017 CONSULTATION DATE:  08/24/17  REFERRING MD:  Dr. Tamala Julian / [facility]   CHIEF COMPLAINT:  SOB  BRIEF SUMMARY:  52 y/o F, smoker, admitted 3/2 with complaints of SOB.  Initially admitted per [facility]. PCCM consulted for increasing respiratory distress on BiPAP.  Per history, the patient was discharged 2-3 days prior from [facility] where she was treated for PNA.  Despite antibiotics, she felt she was not getting better.  She developed increasing SOB, headache and lower abdominal pain.  On presentation, she was found to need 4-6L O2.  Despite supplementation, she required BiPAP which initially improved her symptoms. She was observed on BiPAP.  During the evening of 3/2, the patient vomited and required intubation.  She developed severe ARDS and was placed on paralytics on 3/3.  She was also febrile to 104.   SUBJECTIVE:  No acute events overnight.  Remains on ARDS vent settings.  Off pressors.  Positive balance for admit.    VITAL SIGNS: BP (!) 156/66 (BP Location: Left Leg)   Pulse 87   Temp (!) 102 F (38.9 C) (Core)   Resp (!) 35   Ht _0  (1.676 m)   Wt 193 lb 12.6 oz (87.9 kg)   SpO2 94%   BMI 31.28 kg/m   HEMODYNAMICS: CVP:  [15 mmHg] 15 mmHg  VENTILATOR SETTINGS: Vent Mode: PRVC FiO2 (%):  [45 %-50 %] 45 % Set Rate:  [35 bmp] 35 bmp Vt Set:  [360 mL] 360 mL PEEP:  [5 cmH20-10 cmH20] 10 cmH20 Plateau Pressure:  [21 cmH20-28 cmH20] 28 cmH20  INTAKE / OUTPUT: I/O last 3 completed shifts: In: 2965 [I.V.:965; NG/GT:1850; IV Piggyback:150] Out: 2185 [Urine:2145; Emesis/NG output:40]  PHYSICAL EXAMINATION: General: critically ill appearing female on vent HEENT: MM pink/moist, ETT Neuro: Awakens to voice, follows commands, nods  CV: s1s2 rrr, 2-3/6 murmur (unchanged per Cardiology) PULM: even/non-labored, lungs bilaterally coarse  TG:YBWL, non-tender,  bsx4 active  Extremities: warm/dry, 2+ generalized edema  Skin: no rashes or lesions  LABS:  BMET Recent Labs  Lab 08/28/17 0315 08/29/17 0525 08/30/17 0553  NA 146* 149* 151*  K 3.9 3.2* 5.0  CL 106 109 111  CO2 _1 BUN 28* 34* 35*  CREATININE 0.96 0.91 1.04*  GLUCOSE 176* 157* 115*    Electrolytes Recent Labs  Lab 08/26/17 0314 08/27/17 0306 08/28/17 0315 08/29/17 0525 08/30/17 0553  CALCIUM 7.9* 6.2* 7.7* 7.9* 7.9*  MG 2.1 1.5* 2.1  --  2.2  PHOS 1.8* 1.4* 2.5  --   --     CBC Recent Labs  Lab 08/28/17 0315 08/29/17 0457 08/30/17 0553  WBC 7.7 8.7 9.0  HGB 7.9* 7.4* 7.0*  HCT 25.4* 25.3* 23.9*  PLT 442* 313 361    Coag's Recent Labs  Lab 08/24/17 0511  APTT 37*  INR 1.23    Sepsis Markers Recent Labs  Lab 08/24/17 0511 08/24/17 0930 08/26/17 0314 08/28/17 0315 08/29/17 0525  LATICACIDVEN 2.8* 1.7 2.0*  --   --   PROCALCITON 6.34  --  19.30 9.23 5.47    ABG Recent Labs  Lab 08/26/17 0535 08/26/17 1226 08/27/17 0325  PHART 7.349* 7.473* 7.404  PCO2ART 53.2* 41.5 50.9*  PO2ART 141* 192* 106    Liver Enzymes Recent Labs  Lab 08/24/17 0511  AST 48*  ALT 42  ALKPHOS 76  BILITOT  0.7  ALBUMIN 3.1*    Cardiac Enzymes Recent Labs  Lab 08/24/17 0930 08/24/17 1536 08/26/17 0314  TROPONINI <0.03 <0.03 0.19*    Glucose Recent Labs  Lab 08/29/17 1252 08/29/17 1608 08/29/17 1931 08/29/17 2341 08/30/17 0316 08/30/17 0813  GLUCAP 165* 164* 158* 200* 158* 119*    Imaging Dg Chest Port 1 View  Result Date: 08/30/2017 CLINICAL DATA:  Acute respiratory failure with hypoxia EXAM: PORTABLE CHEST 1 VIEW COMPARISON:  08/29/2017 FINDINGS: Endotracheal tube in good position. Central venous catheter tip in the SVC unchanged. Gastric tube enters the stomach unchanged Severe bilateral airspace disease is symmetric and unchanged. Bibasilar atelectasis and bilateral effusions unchanged IMPRESSION: Severe bilateral airspace disease  with bilateral effusions unchanged. Electronically Signed   By: Franchot Gallo M.D.   On: 08/30/2017 07:08    STUDIES:  ECHO 3/3 >> LVEF 65-70%, normal wall motion, grade 1 diastolic dysfunction, moderate AS, mild AR, LA severely dilated, mildly increased PA pressure, trivial pericardial effusion  LE Venous Duplex 3/3 >> negative   CULTURES: BCx2 3/2 >> negative  Strep Pneumo Antigen 3/3 >> negative  RVP 3/3 >> influenza B positive  Tracheal Aspirate 3/5 >> negative  BCx2 3/7 >>  UA 3/7 >> negative  BAL 3/8 >>  ANTIBIOTICS: Zosyn 3/2 >> 3/8 Vanco 3/2 >> 3/4  Meropenem 3/8 (to start after FOB) >>  Vanco 3/8 (to start after FOB) >>   SIGNIFICANT EVENTS: 3/02  Admit with SOB, vomited on BiPAP > intubated, ARDS  3/03  Placed on paralytics, requiring pressors 3/04  Remains on paralytics, low dose neo.  Bicarb gtt stopped  3/05  PRBC x1.  Electrolyte disturbances (mg, phos, ca). Tmax 102.2. Paralytics stopped 3/06  7L+  for admit despite lasix. Tmax 100.8.  Neo 50 mcg.  No BM.  PEEP 10, 60% FiO2.  3/07  Ongoing fever 102, abx changed.  Off pressors.  LINES/TUBES: ETT 3/2 >>  L IJ TLC 3/3 >>  DISCUSSION:  52 y/o F, smoker, admitted with influenza B & respiratory distress.  Vomited 3/2 and developed ARDS in the setting of flu and suspected aspiration while on BiPAP.  Paralyzed 3/3.    ASSESSMENT / PLAN:  PULMONARY A: ARDS - in setting of influenza B + PNA, prior ARDS / intubation x2  Aspiration PNA  Recent PNA (NOS) Tobacco Abuse  Prior Intubation - 3x per husband, required paralytics P:   PRVC 6 cc/kg, R 35 Wean PEEP / FiO2 per ARDS protocol  Follow CXR  Lasix 40 mg BID    Lighten RASS goal as O2 needs will allow  Duoneb Q6  Plan for FOB with washings given ongoing fever  CARDIOVASCULAR A:  Shock - multifactorial in setting of septic + sedation  Hx Valvular Disease - per husband had a valve surgery at [facility] P:  ICU monitoring  Trend CVP  Appreciate Dr. Einar Gip > see  recommendations from 3/7  RENAL A:   Hypophosphatemia  Hypernatremia Hypokalemia Mixed Acidosis  P:   Trend BMP / urinary output Replace electrolytes as indicated Avoid nephrotoxic agents, ensure adequate renal perfusion Free water 200 ml Q8 PT   GASTROINTESTINAL A:   Abdominal Pain - on admit  Vomiting - 3/2 P:   NPO / OGT  TF per Nutrition  PPI for SUP  Senokot-S for bowel regimen    HEMATOLOGIC A:   Anemia - suspect of critical illness + phlebotomy  P:  Trend CBC  Lovenox for DVT prophylaxis   INFECTIOUS  A:   Influenza B PNA - NOS Fever - began 3/2, ongoing.  Repeat cultures negative.  DVT negative.  Likely flu. P:   Continue zosyn  Follow cultures  Tamiflu x 5 days  Monitor fever curve, PCT trend   ENDOCRINE A:   DM   P:   SSI, moderate coverage 4 units meal coverage  Lantus 18 units QHS   NEUROLOGIC A:   Hx Anxiety, Depression, Panic Attacks, Benzo Dependent  P:   RASS goal: -2 to -3  Versed / Fentanyl for RASS goals / vent synchrony  Passive ROM  PT efforts    FAMILY  - Updates: No family at bedside am 3/8.    - Inter-disciplinary family meet or Palliative Care meeting due by:  3/9   CC Time: 50 minutes   Noe Gens, NP-C [facility] Pgr: [phone] or if no answer [phone] 08/30/2017, 10:39 AM

## 2017-08-31 ENCOUNTER — Inpatient Hospital Stay (HOSPITAL_COMMUNITY): Payer: Medicare PPO

## 2017-08-31 LAB — GLUCOSE, CAPILLARY
GLUCOSE-CAPILLARY: 196 mg/dL — AB (ref 65–99)
GLUCOSE-CAPILLARY: 226 mg/dL — AB (ref 65–99)
GLUCOSE-CAPILLARY: 223 mg/dL — AB (ref 65–99)
GLUCOSE-CAPILLARY: 275 mg/dL — AB (ref 65–99)
GLUCOSE-CAPILLARY: 248 mg/dL — AB (ref 65–99)
Glucose-Capillary: 165 mg/dL — ABNORMAL HIGH (ref 65–99)
Glucose-Capillary: 179 mg/dL — ABNORMAL HIGH (ref 65–99)
Glucose-Capillary: 247 mg/dL — ABNORMAL HIGH (ref 65–99)
Glucose-Capillary: 232 mg/dL — ABNORMAL HIGH (ref 65–99)

## 2017-08-31 LAB — BLOOD GAS, ARTERIAL
Acid-Base Excess: 2.8 mmol/L — ABNORMAL HIGH (ref 0.0–2.0)
BICARBONATE: 31.8 mmol/L — AB (ref 20.0–28.0)
DRAWN BY: 31777
FIO2: 1
O2 SAT: 96.4 %
PATIENT TEMPERATURE: 98.6
PEEP: 18 cmH2O
PO2 ART: 109 mmHg — AB (ref 83.0–108.0)
RATE: 35 resp/min
pCO2 arterial: 84.5 mmHg (ref 32.0–48.0)
pH, Arterial: 7.201 — ABNORMAL LOW (ref 7.350–7.450)

## 2017-08-31 LAB — BPAM RBC
Blood Product Expiration Date: 201904032359
ISSUE DATE / TIME: 201903081829
Unit Type and Rh: 5100

## 2017-08-31 LAB — CBC
HEMATOCRIT: 30.2 % — AB (ref 36.0–46.0)
Hemoglobin: 8.8 g/dL — ABNORMAL LOW (ref 12.0–15.0)
MCH: 27.8 pg (ref 26.0–34.0)
MCHC: 29.1 g/dL — ABNORMAL LOW (ref 30.0–36.0)
MCV: 95.3 fL (ref 78.0–100.0)
Platelets: 456 10*3/uL — ABNORMAL HIGH (ref 150–400)
RBC: 3.17 MIL/uL — ABNORMAL LOW (ref 3.87–5.11)
RDW: 17.9 % — AB (ref 11.5–15.5)
WBC: 12.9 10*3/uL — AB (ref 4.0–10.5)

## 2017-08-31 LAB — TYPE AND SCREEN
ABO/RH(D): O POS
Antibody Screen: NEGATIVE
UNIT DIVISION: 0

## 2017-08-31 LAB — TRIGLYCERIDES: Triglycerides: 214 mg/dL — ABNORMAL HIGH (ref <150)

## 2017-08-31 LAB — BASIC METABOLIC PANEL
Anion gap: 8 (ref 5–15)
BUN: 40 mg/dL — AB (ref 6–20)
CHLORIDE: 109 mmol/L (ref 101–111)
CO2: 32 mmol/L (ref 22–32)
CREATININE: 1.11 mg/dL — AB (ref 0.44–1.00)
Calcium: 8 mg/dL — ABNORMAL LOW (ref 8.9–10.3)
GFR calc non Af Amer: 57 mL/min — ABNORMAL LOW (ref >60)
Glucose, Bld: 264 mg/dL — ABNORMAL HIGH (ref 65–99)
Potassium: 3.9 mmol/L (ref 3.5–5.1)
Sodium: 149 mmol/L — ABNORMAL HIGH (ref 135–145)

## 2017-08-31 LAB — ACID FAST SMEAR (AFB, MYCOBACTERIA): Acid Fast Smear: NEGATIVE

## 2017-08-31 LAB — MAGNESIUM: Magnesium: 2.2 mg/dL (ref 1.7–2.4)

## 2017-08-31 LAB — ACID FAST SMEAR (AFB)

## 2017-08-31 MED ORDER — SODIUM CHLORIDE 0.9 % IV SOLN
3.0000 ug/kg/min | INTRAVENOUS | Status: DC
  Administered 2017-08-31: 3 ug/kg/min via INTRAVENOUS
  Administered 2017-08-31: 4 ug/kg/min via INTRAVENOUS
  Administered 2017-09-01 (×2): 4.5 ug/kg/min via INTRAVENOUS
  Administered 2017-09-01: 4 ug/kg/min via INTRAVENOUS
  Administered 2017-09-02: 5 ug/kg/min via INTRAVENOUS
  Filled 2017-08-31 (×6): qty 20

## 2017-08-31 MED ORDER — FENTANYL CITRATE (PF) 100 MCG/2ML IJ SOLN: 100.0000 ug | Freq: Once | INTRAMUSCULAR | Status: DC

## 2017-08-31 MED ORDER — FUROSEMIDE 10 MG/ML IJ SOLN
60.0000 mg | INTRAMUSCULAR | Status: DC
  Administered 2017-08-31 – 2017-09-02 (×11): 60 mg via INTRAVENOUS
  Filled 2017-08-31 (×11): qty 6

## 2017-08-31 MED ORDER — FENTANYL CITRATE (PF) 100 MCG/2ML IJ SOLN: 100.0000 ug | Freq: Once | INTRAMUSCULAR | Status: DC | PRN

## 2017-08-31 MED ORDER — FENTANYL BOLUS VIA INFUSION
50.0000 ug | INTRAVENOUS | Status: DC | PRN
  Administered 2017-09-02 (×2): 50 ug via INTRAVENOUS
  Filled 2017-08-31: qty 50

## 2017-08-31 MED ORDER — SODIUM CHLORIDE 0.9 % IV SOLN: 2.0000 mg/h | INTRAVENOUS | Status: DC

## 2017-08-31 MED ORDER — FUROSEMIDE 10 MG/ML IJ SOLN
60.0000 mg | Freq: Four times a day (QID) | INTRAMUSCULAR | Status: DC
  Administered 2017-08-31: 60 mg via INTRAVENOUS
  Filled 2017-08-31: qty 6

## 2017-08-31 MED ORDER — MIDAZOLAM HCL 2 MG/2ML IJ SOLN: 2.0000 mg | Freq: Once | INTRAMUSCULAR | Status: DC

## 2017-08-31 MED ORDER — VECURONIUM BOLUS VIA INFUSION
0.0800 mg/kg | Freq: Once | INTRAVENOUS | Status: DC
  Filled 2017-08-31: qty 8

## 2017-08-31 MED ORDER — MIDAZOLAM BOLUS VIA INFUSION: 2.0000 mg | INTRAVENOUS | Status: DC | PRN

## 2017-08-31 MED ORDER — FENTANYL BOLUS VIA INFUSION: 50.0000 ug | INTRAVENOUS | Status: DC | PRN

## 2017-08-31 MED ORDER — SODIUM CHLORIDE 0.9 % IV SOLN: 100.0000 ug/h | INTRAVENOUS | Status: DC

## 2017-08-31 MED ORDER — SODIUM CHLORIDE 0.9 % IJ SOLN
INTRAMUSCULAR | Status: AC
  Filled 2017-08-31: qty 50

## 2017-08-31 MED ORDER — CISATRACURIUM BOLUS VIA INFUSION
0.0500 mg/kg | Freq: Once | INTRAVENOUS | Status: AC
  Administered 2017-08-31: 4.5 mg via INTRAVENOUS
  Filled 2017-08-31: qty 5

## 2017-08-31 MED ORDER — VECURONIUM BROMIDE 10 MG IV SOLR
0.8000 ug/kg/min | INTRAVENOUS | Status: DC
  Filled 2017-08-31: qty 100

## 2017-08-31 MED ORDER — PROPOFOL 1000 MG/100ML IV EMUL
5.0000 ug/kg/min | INTRAVENOUS | Status: DC
  Administered 2017-08-31: 5 ug/kg/min via INTRAVENOUS
  Administered 2017-08-31: 15 ug/kg/min via INTRAVENOUS
  Administered 2017-09-01: 50 ug/kg/min via INTRAVENOUS
  Administered 2017-09-01: 45 ug/kg/min via INTRAVENOUS
  Administered 2017-09-01: 50 ug/kg/min via INTRAVENOUS
  Administered 2017-09-02: 80 ug/kg/min via INTRAVENOUS
  Administered 2017-09-02: 40 ug/kg/min via INTRAVENOUS
  Administered 2017-09-02: 55 ug/kg/min via INTRAVENOUS
  Administered 2017-09-02: 45 ug/kg/min via INTRAVENOUS
  Administered 2017-09-02: 35 ug/kg/min via INTRAVENOUS
  Administered 2017-09-02: 55 ug/kg/min via INTRAVENOUS
  Administered 2017-09-02 – 2017-09-04 (×11): 80 ug/kg/min via INTRAVENOUS
  Filled 2017-08-31 (×2): qty 100
  Filled 2017-08-31: qty 300
  Filled 2017-08-31 (×3): qty 100
  Filled 2017-08-31: qty 200
  Filled 2017-08-31 (×7): qty 100
  Filled 2017-08-31: qty 200
  Filled 2017-08-31 (×2): qty 100
  Filled 2017-08-31: qty 200
  Filled 2017-08-31 (×5): qty 100

## 2017-08-31 MED ORDER — MIDAZOLAM BOLUS VIA INFUSION
2.0000 mg | INTRAVENOUS | Status: DC | PRN
  Administered 2017-09-01 – 2017-09-02 (×2): 2 mg via INTRAVENOUS
  Filled 2017-08-31: qty 2

## 2017-08-31 MED ORDER — FENTANYL 2500MCG IN NS 250ML (10MCG/ML) PREMIX INFUSION
100.0000 ug/h | INTRAVENOUS | Status: DC
  Administered 2017-08-31 – 2017-09-04 (×13): 300 ug/h via INTRAVENOUS
  Filled 2017-08-31 (×14): qty 250

## 2017-08-31 MED ORDER — SODIUM CHLORIDE 0.9 % IV SOLN
2.0000 mg/h | INTRAVENOUS | Status: DC
  Administered 2017-08-31 (×2): 9 mg/h via INTRAVENOUS
  Administered 2017-08-31: 4 mg/h via INTRAVENOUS
  Administered 2017-08-31: 8 mg/h via INTRAVENOUS
  Administered 2017-09-01 (×2): 3 mg/h via INTRAVENOUS
  Administered 2017-09-03: 4 mg/h via INTRAVENOUS
  Filled 2017-08-31 (×7): qty 10

## 2017-08-31 MED ORDER — MIDAZOLAM HCL 2 MG/2ML IJ SOLN: 2.0000 mg | Freq: Once | INTRAMUSCULAR | Status: DC | PRN

## 2017-08-31 MED ORDER — ARTIFICIAL TEARS OPHTHALMIC OINT: 1.0000 "application " | TOPICAL_OINTMENT | Freq: Three times a day (TID) | OPHTHALMIC | Status: DC

## 2017-08-31 MED ORDER — IOPAMIDOL (ISOVUE-370) INJECTION 76%
INTRAVENOUS | Status: AC
  Filled 2017-08-31: qty 100

## 2017-08-31 MED ORDER — ARTIFICIAL TEARS OPHTHALMIC OINT
1.0000 "application " | TOPICAL_OINTMENT | Freq: Three times a day (TID) | OPHTHALMIC | Status: DC
  Administered 2017-08-31 – 2017-09-03 (×10): 1 via OPHTHALMIC
  Filled 2017-08-31 (×2): qty 3.5

## 2017-08-31 MED ORDER — SODIUM CHLORIDE 0.9 % IV SOLN
INTRAVENOUS | Status: DC | PRN
Start: 2017-08-31 — End: 2017-09-13
  Administered 2017-09-08: 13:00:00 via INTRA_ARTERIAL

## 2017-08-31 NOTE — Progress Notes (Signed)
PULMONARY / CRITICAL CARE MEDICINE   Name: Holly Figueroa MRN: [id number] DOB: Dec 27, 1965    ADMISSION DATE:  08/24/2017 CONSULTATION DATE:  08/24/17  REFERRING MD:  Dr. Tamala Julian / [facility]   CHIEF COMPLAINT:  SOB  BRIEF SUMMARY:  52 y/o F, smoker, admitted 3/2 with complaints of SOB.  Initially admitted per [facility]. PCCM consulted for increasing respiratory distress on BiPAP.  Per history, the patient was discharged 2-3 days prior from [facility] where she was treated for PNA.  Despite antibiotics, she felt she was not getting better.  She developed increasing SOB, headache and lower abdominal pain.  On presentation, she was found to need 4-6L O2.  Despite supplementation, she required BiPAP which initially improved her symptoms. She was observed on BiPAP.  During the evening of 3/2, the patient vomited and required intubation.  She developed severe ARDS and was placed on paralytics on 3/3.  She was also febrile to 104.   SUBJECTIVE:  No acute events overnight.  Remains on ARDS vent settings.  Off pressors.  Positive balance for admit.    VITAL SIGNS: BP (!) 172/72   Pulse (!) 109   Temp (!) 101.8 F (38.8 C)   Resp 17   Ht _0  (1.676 m)   Wt 199 lb 11.8 oz (90.6 kg)   SpO2 (!) 88%   BMI 32.24 kg/m   HEMODYNAMICS:    VENTILATOR SETTINGS: Vent Mode: PRVC FiO2 (%):  [40 %-100 %] 100 % Set Rate:  [35 bmp] 35 bmp Vt Set:  [360 mL] 360 mL PEEP:  [10 cmH20-16 cmH20] 16 cmH20 Plateau Pressure:  [25 VJK82-06 cmH20] 25 cmH20  INTAKE / OUTPUT: I/O last 3 completed shifts: In: 4264.3 [I.V.:1349.3; Blood:630; NG/GT:1885; IV Piggyback:400] Out: 0156 [Urine:3735]  PHYSICAL EXAMINATION: Gen:      No acute distress HEENT:  EOMI, sclera anicteric, ETT Neck:     No masses; no thyromegaly Lungs:    Clear to auscultation bilaterally; normal respiratory effort CV:         Regular rate and rhythm; no murmurs Abd:      + bowel sounds; soft, non-tender; no palpable masses, no  distension Ext:    No edema; adequate peripheral perfusion Skin:      Warm and dry; no rash Neuro: Sedated, unresponsive  LABS:  BMET Recent Labs  Lab 08/29/17 0525 08/30/17 0553 08/31/17 0432  NA 149* 151* 149*  K 3.2* 5.0 3.9  CL 109 111 109  CO2 30 31 32  BUN 34* 35* 40*  CREATININE 0.91 1.04* 1.11*  GLUCOSE 157* 115* 264*    Electrolytes Recent Labs  Lab 08/26/17 0314 08/27/17 0306 08/28/17 0315 08/29/17 0525 08/30/17 0553 08/31/17 0432  CALCIUM 7.9* 6.2* 7.7* 7.9* 7.9* 8.0*  MG 2.1 1.5* 2.1  --  2.2 2.2  PHOS 1.8* 1.4* 2.5  --   --   --     CBC Recent Labs  Lab 08/29/17 0457 08/30/17 0553 08/31/17 0432  WBC 8.7 9.0 12.9*  HGB 7.4* 7.0* 8.8*  HCT 25.3* 23.9* 30.2*  PLT 313 361 456*    Coag's No results for input(s): APTT, INR in the last 168 hours.  Sepsis Markers Recent Labs  Lab 08/24/17 0930 08/26/17 0314 08/28/17 0315 08/29/17 0525  LATICACIDVEN 1.7 2.0*  --   --   PROCALCITON  --  19.30 9.23 5.47    ABG Recent Labs  Lab 08/26/17 0535 08/26/17 1226 08/27/17 0325  PHART 7.349* 7.473* 7.404  PCO2ART 53.2* 41.5 50.9*  PO2ART 141* 192* 106    Liver Enzymes No results for input(s): AST, ALT, ALKPHOS, BILITOT, ALBUMIN in the last 168 hours.  Cardiac Enzymes Recent Labs  Lab 08/24/17 0930 08/24/17 1536 08/26/17 0314  TROPONINI <0.03 <0.03 0.19*    Glucose Recent Labs  Lab 08/30/17 1557 08/30/17 2037 08/30/17 2252 08/31/17 0113 08/31/17 0431 08/31/17 0801  GLUCAP 143* 196* 179* 226* 247* 232*    Imaging Dg Chest Port 1 View  Result Date: 08/31/2017 CLINICAL DATA:  Acute respiratory failure EXAM: PORTABLE CHEST 1 VIEW COMPARISON:  August 30, 2017 FINDINGS: The ETT terminates in good position. The NG tube terminates below today's film. The left central line is stable terminating in the SVC. No pneumothorax. Diffuse bilateral pulmonary opacities are stable on the right and worsened on the left in the interval. The  cardiomediastinal silhouette is partially obscured by the increasing left-sided opacities. No other changes. IMPRESSION: 1. Worsening diffuse pulmonary opacities, left greater than right. 2. Stable support apparatus. Electronically Signed   By: Dorise Bullion III M.D   On: 08/31/2017 07:00    STUDIES:  ECHO 3/3 >> LVEF 65-70%, normal wall motion, grade 1 diastolic dysfunction, moderate AS, mild AR, LA severely dilated, mildly increased PA pressure, trivial pericardial effusion  LE Venous Duplex 3/3 >> negative   CULTURES: BCx2 3/2 >> negative  Strep Pneumo Antigen 3/3 >> negative  RVP 3/3 >> influenza B positive  Tracheal Aspirate 3/5 >> negative  BCx2 3/7 >>  UA 3/7 >> negative  BAL 3/8 >>  ANTIBIOTICS: Zosyn 3/2 >> 3/8 Vanco 3/2 >> 3/4  Meropenem 3/8 (to start after FOB) >>  Vanco 3/8 (to start after FOB) >>   SIGNIFICANT EVENTS: 3/02  Admit with SOB, vomited on BiPAP > intubated, ARDS  3/03  Placed on paralytics, requiring pressors 3/04  Remains on paralytics, low dose neo.  Bicarb gtt stopped  3/05  PRBC x1.  Electrolyte disturbances (mg, phos, ca). Tmax 102.2. Paralytics stopped 3/06  7L+  for admit despite lasix. Tmax 100.8.  Neo 50 mcg.  No BM.  PEEP 10, 60% FiO2.  3/07  Ongoing fever 102, abx changed.  Off pressors. 3/8.  Bronchoscopy showing hemorrhage from the right upper lobe.  LINES/TUBES: ETT 3/2 >>  L IJ TLC 3/3 >>  DISCUSSION:  52 y/o F, smoker, admitted with influenza B & respiratory distress.  Vomited 3/2 and developed ARDS in the setting of flu and suspected aspiration while on BiPAP.  Paralyzed 3/3.    Bronch 3/8 unexpectedly showed focal bleeding from right upper lobe, anterior segment Discussed with Dr. Kathlene Cote, [place].  We will get CTA for characterization of lung, bronchial artery circulation and possible embolization.  ASSESSMENT / PLAN:  PULMONARY A: ARDS - in setting of influenza B + PNA, prior ARDS / intubation x2  Aspiration PNA  Recent PNA  (NOS) Tobacco Abuse  Prior Intubation - 3x per husband, required paralytics Bronchoscopy shows focal hemorrhage from right upper lobe. P:   More dysynchronous with vent today Resume paralytics.  Continue alternate ventilation Aggressive diuresis with Lasix CTA of chest  CARDIOVASCULAR A:  Shock - multifactorial in setting of septic + sedation  Hx Valvular Disease - per husband had a valve surgery at [facility] P:  ICU monitoring  Trend CVP  Appreciate Dr. Einar Gip > see recommendations from 3/7 Increase lasix dose  RENAL A:   Hypophosphatemia  Hypernatremia Hypokalemia Mixed Acidosis  P:   Trend BMP / urinary  output Replace electrolytes as indicated Avoid nephrotoxic agents, ensure adequate renal perfusion Free water  GASTROINTESTINAL A:   Abdominal Pain - on admit  Vomiting - 3/2 P:   NPO / OGT  TF per Nutrition  PPI for SUP  Senokot-S for bowel regimen    HEMATOLOGIC A:   Anemia - suspect of critical illness + phlebotomy  P:  Trend CBC  Lovenox for DVT prophylaxis   INFECTIOUS A:   Influenza B PNA - NOS Fever - began 3/2, ongoing.  Repeat cultures negative.  DVT negative.  Likely flu. P:   Abx changed to meropenem Follow cultures  Tamiflu x 5 days  Monitor fever curve, PCT trend   ENDOCRINE A:   DM   P:   SSI, lantus  NEUROLOGIC A:   Hx Anxiety, Depression, Panic Attacks, Benzo Dependent  P:   RASS goal: -2 to -3  Versed / Fentanyl for RASS goals / vent synchrony  Restart nimbex as she is dyssynchronous with vent  FAMILY  - Updates: family updated 3/9 - Inter-disciplinary family meet or Palliative Care meeting due by:  3/9  The patient is critically ill with multiple organ system failure and requires high complexity decision making for assessment and support, frequent evaluation and titration of therapies, advanced monitoring, review of radiographic studies and interpretation of complex data.   Critical Care Time devoted to patient care  services, exclusive of separately billable procedures, described in this note is 35  minutes.   Marshell Garfinkel MD [place] [facility] Pager [phone] If no answer or after 3pm call: [phone] 08/31/2017, 9:06 AM

## 2017-08-31 NOTE — Progress Notes (Signed)
Unable to maintain sats so we have continued to increase oxygen. Breathing irregular so we have continued to increase sedation. Talked with [facility] and they had respiratory do recruitment measures. Patient will breath with the vent at times then not. Any stimulation causes decrease in sats and breathing patterns.

## 2017-08-31 NOTE — Procedures (Signed)
Arterial Catheter Insertion Procedure Note Holly Figueroa [id number] 1966-05-26  Procedure: Insertion of Arterial Catheter  Indications: Blood pressure monitoring  Procedure Details Consent: Unable to obtain consent because of emergent medical necessity. Time Out: Verified patient identification, verified procedure, site/side was marked, verified correct patient position, special equipment/implants available, medications/allergies/relevent history reviewed, required imaging and test results available.  Performed  Maximum sterile technique was used including antiseptics, cap, gloves, gown, hand hygiene, mask and sheet. Skin prep: Chlorhexidine; local anesthetic administered 22 gauge catheter was inserted into right radial artery using the Seldinger technique.  Evaluation Blood flow good; BP tracing good. Complications: No apparent complications.   [place], [place] 08/31/2017

## 2017-09-01 ENCOUNTER — Inpatient Hospital Stay (HOSPITAL_COMMUNITY): Payer: Medicare PPO

## 2017-09-01 LAB — BLOOD GAS, ARTERIAL
ACID-BASE EXCESS: 10.4 mmol/L — AB (ref 0.0–2.0)
ACID-BASE EXCESS: 12.2 mmol/L — AB (ref 0.0–2.0)
BICARBONATE: 35.4 mmol/L — AB (ref 20.0–28.0)
BICARBONATE: 37.8 mmol/L — AB (ref 20.0–28.0)
DRAWN BY: 317771
Drawn by: 11249
FIO2: 100
FIO2: 60
MECHVT: 360 mL
O2 Saturation: 99.1 %
PATIENT TEMPERATURE: 37.4
PCO2 ART: 52.9 mmHg — AB (ref 32.0–48.0)
PEEP/CPAP: 18 cmH2O
PEEP: 18 cmH2O
PH ART: 7.443 (ref 7.350–7.450)
PH ART: 7.434 (ref 7.350–7.450)
PO2 ART: 397 mmHg — AB (ref 83.0–108.0)
Patient temperature: 37
RATE: 35 resp/min
RATE: 35 resp/min
VT: 360 mL
pCO2 arterial: 57.4 mmHg — ABNORMAL HIGH (ref 32.0–48.0)
pO2, Arterial: 164 mmHg — ABNORMAL HIGH (ref 83.0–108.0)

## 2017-09-01 LAB — CBC
HCT: 28.1 % — ABNORMAL LOW (ref 36.0–46.0)
Hemoglobin: 7.9 g/dL — ABNORMAL LOW (ref 12.0–15.0)
MCH: 27.1 pg (ref 26.0–34.0)
MCHC: 28.1 g/dL — ABNORMAL LOW (ref 30.0–36.0)
MCV: 96.6 fL (ref 78.0–100.0)
PLATELETS: 487 10*3/uL — AB (ref 150–400)
RBC: 2.91 MIL/uL — AB (ref 3.87–5.11)
RDW: 17.4 % — AB (ref 11.5–15.5)
WBC: 12.4 10*3/uL — ABNORMAL HIGH (ref 4.0–10.5)

## 2017-09-01 LAB — BASIC METABOLIC PANEL
Anion gap: 11 (ref 5–15)
BUN: 40 mg/dL — AB (ref 6–20)
CALCIUM: 8.4 mg/dL — AB (ref 8.9–10.3)
CO2: 35 mmol/L — ABNORMAL HIGH (ref 22–32)
Chloride: 106 mmol/L (ref 101–111)
Creatinine, Ser: 1.04 mg/dL — ABNORMAL HIGH (ref 0.44–1.00)
GFR calc Af Amer: >60 mL/min (ref >60)
GFR calc non Af Amer: >60 mL/min (ref >60)
Glucose, Bld: 156 mg/dL — ABNORMAL HIGH (ref 65–99)
POTASSIUM: 3.3 mmol/L — AB (ref 3.5–5.1)
SODIUM: 152 mmol/L — AB (ref 135–145)

## 2017-09-01 LAB — GLUCOSE, CAPILLARY
GLUCOSE-CAPILLARY: 181 mg/dL — AB (ref 65–99)
Glucose-Capillary: 140 mg/dL — ABNORMAL HIGH (ref 65–99)
Glucose-Capillary: 161 mg/dL — ABNORMAL HIGH (ref 65–99)
Glucose-Capillary: 173 mg/dL — ABNORMAL HIGH (ref 65–99)
Glucose-Capillary: 80 mg/dL (ref 65–99)
Glucose-Capillary: 98 mg/dL (ref 65–99)

## 2017-09-01 LAB — PHOSPHORUS: Phosphorus: 2.2 mg/dL — ABNORMAL LOW (ref 2.5–4.6)

## 2017-09-01 LAB — MAGNESIUM: MAGNESIUM: 1.9 mg/dL (ref 1.7–2.4)

## 2017-09-01 NOTE — Progress Notes (Signed)
Pt. Has been prone throughout shift withQ2 hour turns.  ETT has been stable and no desaturations noted.

## 2017-09-01 NOTE — Progress Notes (Signed)
PULMONARY / CRITICAL CARE MEDICINE   Name: Holly Figueroa MRN: [id number] DOB: 04-25-1966    ADMISSION DATE:  08/24/2017 CONSULTATION DATE:  08/24/17  REFERRING MD:  Dr. Tamala Julian / [facility]   CHIEF COMPLAINT:  SOB  BRIEF SUMMARY:  52 y/o F, smoker, admitted 3/2 with complaints of SOB.  Initially admitted per [facility]. PCCM consulted for increasing respiratory distress on BiPAP.  Per history, the patient was discharged 2-3 days prior from [facility] where she was treated for PNA.  Despite antibiotics, she felt she was not getting better.  She developed increasing SOB, headache and lower abdominal pain.  On presentation, she was found to need 4-6L O2.  Despite supplementation, she required BiPAP which initially improved her symptoms. She was observed on BiPAP.  During the evening of 3/2, the patient vomited and required intubation.  She developed severe ARDS and was placed on paralytics on 3/3.  She was also febrile to 104.   SUBJECTIVE:   CT aborted as she had desats on the scanner table. Started aggressive diuresis and proned.  Restarted on Nimbex  VITAL SIGNS: BP (!) 107/41   Pulse 68   Temp 99.5 F (37.5 C)   Resp (!) 35   Ht _0  (1.676 m)   Wt 188 lb 11.4 oz (85.6 kg)   SpO2 100%   BMI 30.46 kg/m   HEMODYNAMICS: CVP:  [22 mmHg] 22 mmHg  VENTILATOR SETTINGS: Vent Mode: PRVC FiO2 (%):  [100 %] 100 % Set Rate:  [35 bmp] 35 bmp Vt Set:  [360 mL] 360 mL PEEP:  [18 cmH20] 18 cmH20 Plateau Pressure:  [30 cmH20-34 cmH20] 30 cmH20  INTAKE / OUTPUT: I/O last 3 completed shifts: In: 4743.5 [I.V.:2272.2; Blood:315; NG/GT:1306.3; IV Piggyback:850] Out: 4098 [Urine:7685]  PHYSICAL EXAMINATION: Gen:      No acute distress, proned HEENT:  EOMI, sclera anicteric, ET tube Neck:     No masses; no thyromegaly Lungs:    Clear to auscultation bilaterally; normal respiratory effort CV:         Regular rate and rhythm; no murmurs Abd:      + bowel sounds; soft, non-tender; no palpable  masses, no distension Ext:    1+ edema; adequate peripheral perfusion Skin:      Warm and dry; no rash Neuro: Sedated, paralyzed.  LABS:  BMET Recent Labs  Lab 08/29/17 0525 08/30/17 0553 08/31/17 0432  NA 149* 151* 149*  K 3.2* 5.0 3.9  CL 109 111 109  CO2 30 31 32  BUN 34* 35* 40*  CREATININE 0.91 1.04* 1.11*  GLUCOSE 157* 115* 264*    Electrolytes Recent Labs  Lab 08/26/17 0314 08/27/17 0306 08/28/17 0315 08/29/17 0525 08/30/17 0553 08/31/17 0432  CALCIUM 7.9* 6.2* 7.7* 7.9* 7.9* 8.0*  MG 2.1 1.5* 2.1  --  2.2 2.2  PHOS 1.8* 1.4* 2.5  --   --   --     CBC Recent Labs  Lab 08/30/17 0553 08/31/17 0432 09/01/17 0445  WBC 9.0 12.9* 12.4*  HGB 7.0* 8.8* 7.9*  HCT 23.9* 30.2* 28.1*  PLT 361 456* 487*    Coag's No results for input(s): APTT, INR in the last 168 hours.  Sepsis Markers Recent Labs  Lab 08/26/17 0314 08/28/17 0315 08/29/17 0525  LATICACIDVEN 2.0*  --   --   PROCALCITON 19.30 9.23 5.47    ABG Recent Labs  Lab 08/27/17 0325 08/31/17 1140 09/01/17 0625  PHART 7.404 7.201* 7.443  PCO2ART 50.9* 84.5* 52.9*  PO2ART 106 109* 397*    Liver Enzymes No results for input(s): AST, ALT, ALKPHOS, BILITOT, ALBUMIN in the last 168 hours.  Cardiac Enzymes Recent Labs  Lab 08/26/17 0314  TROPONINI 0.19*    Glucose Recent Labs  Lab 08/31/17 0801 08/31/17 1145 08/31/17 1617 08/31/17 1955 09/01/17 0002 09/01/17 0505  GLUCAP 232* 223* 275* 248* 173* 161*    Imaging Dg Chest Port 1 View  Result Date: 09/01/2017 CLINICAL DATA:  Acute respiratory failure EXAM: PORTABLE CHEST 1 VIEW COMPARISON:  08/31/2017 FINDINGS: Support devices are stable. Diffuse bilateral airspace disease improved since prior study. Heart is normal size. No visible significant effusions. IMPRESSION: Diffuse bilateral airspace disease, improving since prior study. Electronically Signed   By: Rolm Baptise M.D.   On: 09/01/2017 07:07    STUDIES:  ECHO 3/3 >> LVEF  65-70%, normal wall motion, grade 1 diastolic dysfunction, moderate AS, mild AR, LA severely dilated, mildly increased PA pressure, trivial pericardial effusion  LE Venous Duplex 3/3 >> negative   CULTURES: BCx2 3/2 >> negative  Strep Pneumo Antigen 3/3 >> negative  RVP 3/3 >> influenza B positive  Tracheal Aspirate 3/5 >> negative  BCx2 3/7 >>  UA 3/7 >> negative  BAL 3/8 >>  ANTIBIOTICS: Zosyn 3/2 >> 3/8 Vanco 3/2 >> 3/4  Meropenem 3/8  >>  Vanco 3/8  >>   SIGNIFICANT EVENTS: 3/02  Admit with SOB, vomited on BiPAP > intubated, ARDS  3/03  Placed on paralytics, requiring pressors 3/04  Remains on paralytics, low dose neo.  Bicarb gtt stopped  3/05  PRBC x1.  Electrolyte disturbances (mg, phos, ca). Tmax 102.2. Paralytics stopped 3/06  7L+  for admit despite lasix. Tmax 100.8.  Neo 50 mcg.  No BM.  PEEP 10, 60% FiO2.  3/07  Ongoing fever 102, abx changed.  Off pressors. 3/8.  Bronchoscopy showing hemorrhage from the right upper lobe. 3/9 Reparalysed, proned  LINES/TUBES: ETT 3/2 >>  L IJ TLC 3/3 >> Rt radial A line 3/9>>  DISCUSSION:  52 y/o F, smoker, admitted with influenza B & respiratory distress.  Vomited 3/2 and developed ARDS in the setting of flu and suspected aspiration while on BiPAP.   Bronch 3/8 unexpectedly showed focal bleeding from right upper lobe, anterior segment Complicated situation.  Suspect respiratory failure is from combination of focal pulmonary hemorrhage, volume overload secondary to valvular heart disease and ARDS from flu infection. ECMO has been considered.  Will hold off calling [facility] for today as she appears to have improved with proning, paralysis and aggressive diuresis.  ASSESSMENT / PLAN:  PULMONARY A: ARDS - in setting of influenza B + PNA, prior ARDS / intubation x2  Aspiration PNA  Recent PNA (NOS) Tobacco Abuse  Prior Intubation - 3x per husband, required paralytics Bronchoscopy shows focal hemorrhage from right upper lobe. CTA to  look at lung and bronchial artery cancelled as she is unstable P:   Continue paralytics. Wean down Fio2 Continue diuresis with lasix. Watch creatinine  CTA of chest when stable. Will need to reduscuss with IR and need steroid prep again as she has contrast allergy (hives per husband, not anaphylaxis)  CARDIOVASCULAR A:  Shock - multifactorial in setting of septic + sedation  Hx Valvular Disease (AS, MS) - per husband had a valve surgery at [facility]:  ICU monitoring  Trend CVP. Flowtrac monitoring if available at [place] Dr. Einar Gip > see recommendations from 3/7 Continue lasix.   RENAL A:   Hypophosphatemia  Hypernatremia Hypokalemia Mixed Acidosis  P:   Follow urine output and Cr Free water  GASTROINTESTINAL A:   Abdominal Pain - on admit  Vomiting - 3/2 P:   Holding tube feeds PPI for prophylaxis   HEMATOLOGIC A:   Anemia P:  Follow CBC Holding Hep SQ. Continue SCDs  INFECTIOUS A:   Influenza B PNA - NOS Fever - began 3/2, ongoing.  Repeat cultures negative.  DVT negative.  Likely flu. P:   Abx changed to meropenem, vanco Follow cultures  Tamiflu x 5 days  Follow BAL cultures  ENDOCRINE A:   DM   P:   SSI, lantus  NEUROLOGIC A:   Hx Anxiety, Depression, Panic Attacks, Benzo Dependent  P:   RASS goal: -2 to -3  Versed / Fentanyl for RASS goals / vent synchrony  Nimbex for dysynchrony  FAMILY  - Updates: family updated daily - Inter-disciplinary family meet or Palliative Care meeting due by:  3/9  The patient is critically ill with multiple organ system failure and requires high complexity decision making for assessment and support, frequent evaluation and titration of therapies, advanced monitoring, review of radiographic studies and interpretation of complex data.   Critical Care Time devoted to patient care services, exclusive of separately billable procedures, described in this note is 35  minutes.   Marshell Garfinkel MD [place]  [facility] Pager [phone] If no answer or after 3pm call: [phone] 09/01/2017, 7:36 AM

## 2017-09-02 ENCOUNTER — Inpatient Hospital Stay (HOSPITAL_COMMUNITY): Payer: Medicare PPO

## 2017-09-02 DIAGNOSIS — L8990 Pressure ulcer of unspecified site, unspecified stage: Secondary | ICD-10-CM

## 2017-09-02 LAB — BLOOD GAS, ARTERIAL
ACID-BASE EXCESS: 13.6 mmol/L — AB (ref 0.0–2.0)
ACID-BASE EXCESS: 13.5 mmol/L — AB (ref 0.0–2.0)
ACID-BASE EXCESS: 13.2 mmol/L — AB (ref 0.0–2.0)
Acid-Base Excess: 14.1 mmol/L — ABNORMAL HIGH (ref 0.0–2.0)
BICARBONATE: 39.2 mmol/L — AB (ref 20.0–28.0)
BICARBONATE: 39.5 mmol/L — AB (ref 20.0–28.0)
Bicarbonate: 38.1 mmol/L — ABNORMAL HIGH (ref 20.0–28.0)
Bicarbonate: 38.3 mmol/L — ABNORMAL HIGH (ref 20.0–28.0)
DRAWN BY: 51425
DRAWN BY: 257881
Drawn by: 257881
Drawn by: 25788
FIO2: 60
FIO2: 50
FIO2: 45
FIO2: 45
MECHVT: 360 mL
MECHVT: 360 mL
O2 SAT: 99.6 %
O2 SAT: 89.9 %
O2 Saturation: 98.6 %
O2 Saturation: 93.8 %
PATIENT TEMPERATURE: 99
PCO2 ART: 53.6 mmHg — AB (ref 32.0–48.0)
PEEP/CPAP: 18 cmH2O
PEEP: 14 cmH2O
PEEP: 10 cmH2O
PEEP: 5 cmH2O
PH ART: 7.511 — AB (ref 7.350–7.450)
PH ART: 7.449 (ref 7.350–7.450)
PH ART: 7.468 — AB (ref 7.350–7.450)
PO2 ART: 60.9 mmHg — AB (ref 83.0–108.0)
Patient temperature: 98.6
Patient temperature: 98.6
Patient temperature: 98.6
RATE: 35 resp/min
RATE: 28 resp/min
RATE: 28 resp/min
RATE: 28 resp/min
VT: 360 mL
pCO2 arterial: 48.1 mmHg — ABNORMAL HIGH (ref 32.0–48.0)
pCO2 arterial: 57.3 mmHg — ABNORMAL HIGH (ref 32.0–48.0)
pCO2 arterial: 56.3 mmHg — ABNORMAL HIGH (ref 32.0–48.0)
pH, Arterial: 7.46 — ABNORMAL HIGH (ref 7.350–7.450)
pO2, Arterial: 228 mmHg — ABNORMAL HIGH (ref 83.0–108.0)
pO2, Arterial: 130 mmHg — ABNORMAL HIGH (ref 83.0–108.0)
pO2, Arterial: 73 mmHg — ABNORMAL LOW (ref 83.0–108.0)

## 2017-09-02 LAB — CBC
HEMATOCRIT: 27.6 % — AB (ref 36.0–46.0)
HEMOGLOBIN: 7.8 g/dL — AB (ref 12.0–15.0)
MCH: 27.4 pg (ref 26.0–34.0)
MCHC: 28.3 g/dL — AB (ref 30.0–36.0)
MCV: 96.8 fL (ref 78.0–100.0)
Platelets: 530 10*3/uL — ABNORMAL HIGH (ref 150–400)
RBC: 2.85 MIL/uL — ABNORMAL LOW (ref 3.87–5.11)
RDW: 17.3 % — ABNORMAL HIGH (ref 11.5–15.5)
WBC: 10.8 10*3/uL — ABNORMAL HIGH (ref 4.0–10.5)

## 2017-09-02 LAB — GLUCOSE, CAPILLARY
GLUCOSE-CAPILLARY: 133 mg/dL — AB (ref 65–99)
GLUCOSE-CAPILLARY: 142 mg/dL — AB (ref 65–99)
GLUCOSE-CAPILLARY: 170 mg/dL — AB (ref 65–99)
Glucose-Capillary: 122 mg/dL — ABNORMAL HIGH (ref 65–99)
Glucose-Capillary: 82 mg/dL (ref 65–99)
Glucose-Capillary: 87 mg/dL (ref 65–99)
Glucose-Capillary: 109 mg/dL — ABNORMAL HIGH (ref 65–99)
Glucose-Capillary: 153 mg/dL — ABNORMAL HIGH (ref 65–99)

## 2017-09-02 LAB — BASIC METABOLIC PANEL
Anion gap: 10 (ref 5–15)
BUN: 44 mg/dL — AB (ref 6–20)
CHLORIDE: 105 mmol/L (ref 101–111)
CO2: 38 mmol/L — AB (ref 22–32)
CREATININE: 1.02 mg/dL — AB (ref 0.44–1.00)
Calcium: 8.7 mg/dL — ABNORMAL LOW (ref 8.9–10.3)
GFR calc non Af Amer: >60 mL/min (ref >60)
GLUCOSE: 84 mg/dL (ref 65–99)
Potassium: 3 mmol/L — ABNORMAL LOW (ref 3.5–5.1)
Sodium: 153 mmol/L — ABNORMAL HIGH (ref 135–145)

## 2017-09-02 LAB — PNEUMOCYSTIS JIROVECI SMEAR BY DFA: PNEUMOCYSTIS JIROVECI AG: NEGATIVE

## 2017-09-02 LAB — MAGNESIUM: Magnesium: 2.2 mg/dL (ref 1.7–2.4)

## 2017-09-02 LAB — PHOSPHORUS: PHOSPHORUS: 3.5 mg/dL (ref 2.5–4.6)

## 2017-09-02 MED ORDER — POTASSIUM CHLORIDE 20 MEQ/15ML (10%) PO SOLN
40.0000 meq | ORAL | Status: AC
  Administered 2017-09-02 (×3): 40 meq
  Filled 2017-09-02 (×3): qty 30

## 2017-09-02 MED ORDER — FUROSEMIDE 10 MG/ML IJ SOLN: 60.0000 mg | Freq: Two times a day (BID) | INTRAMUSCULAR | Status: DC

## 2017-09-02 MED ORDER — FUROSEMIDE 10 MG/ML IJ SOLN: 60.0000 mg | Freq: Every day | INTRAMUSCULAR | Status: DC

## 2017-09-02 MED ORDER — PRO-STAT SUGAR FREE PO LIQD
60.0000 mL | Freq: Two times a day (BID) | ORAL | Status: DC
  Administered 2017-09-02 – 2017-09-07 (×9): 60 mL
  Filled 2017-09-02 (×10): qty 60

## 2017-09-02 MED ORDER — VITAL HIGH PROTEIN PO LIQD
1000.0000 mL | ORAL | Status: DC
  Administered 2017-09-02 – 2017-09-07 (×3): 1000 mL
  Filled 2017-09-02 (×2): qty 1000

## 2017-09-02 MED ORDER — FREE WATER
200.0000 mL | Status: DC
  Administered 2017-09-02 – 2017-09-04 (×12): 200 mL

## 2017-09-02 MED ORDER — PANTOPRAZOLE SODIUM 40 MG PO PACK
40.0000 mg | PACK | Freq: Every day | ORAL | Status: DC
  Administered 2017-09-03 – 2017-09-05 (×3): 40 mg
  Filled 2017-09-02 (×3): qty 20

## 2017-09-02 NOTE — Progress Notes (Signed)
RT called by the nurse stating that the tubing on the ventilator had blood in it. The tube had thin frank blood and about a 50 cent piece size blood clot. Rt changed the ballard and HME. Nurse called again about 20 minutes later and stated that the Pt's Sats were dropping. RT bag and lavaged the Pt and got a few small clots and did a recruitment. Rt increased the FIO2 to 50% and her PEEP to 10. RT stated that the doctor may need to notified due to the SATS and the frank blood.

## 2017-09-02 NOTE — Progress Notes (Signed)
RT called to room for Pt desaturations.  Pt lavaged and suctioned for small amount or thin bloody secretions.  PEEP increased to 12 cm H2O and FiO2 increased to 80%.  Pt current SpO2 is 91%

## 2017-09-02 NOTE — Progress Notes (Signed)
At appx 1800 pt was laid flat and turned to clean up from a BM. The patient coughed a large blood clot into the ventilator accordion tubing at that time, which was changed. Appx 30 minutes later the patient began coughing and oxygen saturation began to drop into the low 80's. The patient did not recover with suctioning. Pt did recover briefly with 100% oxygen, but saturations dropped as soon as the oxygen went back to 45%. Respiratory therapy was called to the bedside. Recruitment maneuvers were performed and the patient was manually bagged. Oxygen increased to 50% and peep increased to 10. Pt was asynchronous with the vent and awake. Sedation was increased. Family updated at bedside. Pt now resting comfortably. Will continue to monitor.

## 2017-09-02 NOTE — Progress Notes (Addendum)
PULMONARY / CRITICAL CARE MEDICINE   Name: Holly Figueroa MRN: [id number] DOB: April 24, 1966    ADMISSION DATE:  08/24/2017 CONSULTATION DATE:  08/24/17  REFERRING MD:  Dr. Tamala Julian / [facility]   CHIEF COMPLAINT:  SOB  BRIEF SUMMARY:  52 y/o F, smoker, admitted 3/2 with complaints of SOB.  Initially admitted per [facility]. PCCM consulted for increasing respiratory distress on BiPAP.  Per history, the patient was discharged 2-3 days prior from [facility] where she was treated for PNA.  Despite antibiotics, she felt she was not getting better.  She developed increasing SOB, headache and lower abdominal pain.  On presentation, she was found to need 4-6L O2.  Despite supplementation, she required BiPAP which initially improved her symptoms. She was observed on BiPAP.  During the evening of 3/2, the patient vomited and required intubation.  She developed severe ARDS and was placed on paralytics on 3/3.  She was also febrile to 104.   SUBJECTIVE:   Gas exchange better. Clinically better. Decreasing PEEP/FIo2   VITAL SIGNS: BP (!) 143/64   Pulse (!) 102   Temp 100.2 F (37.9 C)   Resp (!) 35   Ht _0  (1.676 m)   Wt 178 lb 9.2 oz (81 kg)   SpO2 100%   BMI 28.82 kg/m   HEMODYNAMICS: CVP:  [4 mmHg-19 mmHg] 4 mmHg  VENTILATOR SETTINGS: Vent Mode: PRVC FiO2 (%):  [60 %-70 %] 60 % Set Rate:  [35 bmp] 35 bmp Vt Set:  [360 mL] 360 mL PEEP:  [18 cmH20] 18 cmH20 Plateau Pressure:  [26 cmH20-29 cmH20] 29 cmH20  INTAKE / OUTPUT: I/O last 3 completed shifts: In: 2662.6 [I.V.:2462.6; IV Piggyback:200] Out: 35009 [FGHWE:99371]  PHYSICAL EXAMINATION: Gen:  Sedated and paralyzed on vent  HEENT: NCAT orally intubated. Non-icteric. No JVD Lungs: clear equal chest rise  CV:   RRR w/out MRG Abd: soft hypoactive + bowel sounds Ext: brisk CR warm, + pulses. Trace LE edema  Skin: warm and dry  Neuro: sedated and paralyzed   LABS:  BMET Recent Labs  Lab 08/31/17 0432 09/01/17 0445  09/02/17 0556  NA 149* 152* 153*  K 3.9 3.3* 3.0*  CL 109 106 105  CO2 32 35* 38*  BUN 40* 40* 44*  CREATININE 1.11* 1.04* 1.02*  GLUCOSE 264* 156* 84    Electrolytes Recent Labs  Lab 08/28/17 0315  08/31/17 0432 09/01/17 0445 09/02/17 0556  CALCIUM 7.7*   < > 8.0* 8.4* 8.7*  MG 2.1   < > 2.2 1.9 2.2  PHOS 2.5  --   --  2.2* 3.5   < > = values in this interval not displayed.    CBC Recent Labs  Lab 08/31/17 0432 09/01/17 0445 09/02/17 0556  WBC 12.9* 12.4* 10.8*  HGB 8.8* 7.9* 7.8*  HCT 30.2* 28.1* 27.6*  PLT 456* 487* 530*    Coag's No results for input(s): APTT, INR in the last 168 hours.  Sepsis Markers Recent Labs  Lab 08/28/17 0315 08/29/17 0525  PROCALCITON 9.23 5.47    ABG Recent Labs  Lab 09/01/17 0625 09/01/17 1621 09/02/17 0223  PHART 7.443 7.434 7.511*  PCO2ART 52.9* 57.4* 48.1*  PO2ART 397* 164* 228*    Liver Enzymes No results for input(s): AST, ALT, ALKPHOS, BILITOT, ALBUMIN in the last 168 hours.  Cardiac Enzymes No results for input(s): TROPONINI, PROBNP in the last 168 hours.  Glucose Recent Labs  Lab 09/01/17 1620 09/01/17 2027 09/01/17 2245 09/02/17 0057 09/02/17  0437 09/02/17 0746  GLUCAP 140* 80 98 142* 82 87    Imaging Dg Chest Port 1 View  Result Date: 09/02/2017 CLINICAL DATA:  Acute respiratory failure. EXAM: PORTABLE CHEST 1 VIEW COMPARISON:  09/01/2017 FINDINGS: Endotracheal tube terminates 2.8 cm above the carina. Left jugular catheter terminates over the lower SVC. Enteric tube courses into the left upper abdomen with side hole below the diaphragm and tip not imaged. Telemetry leads and other external tubing overlie the chest. The cardiac silhouette is normal in size. Extensive interstitial and basilar predominant airspace opacities have further improved from yesterday's examination. No sizable pleural effusion or pneumothorax is identified. IMPRESSION: Continued improvement of bilateral airspace disease.  Electronically Signed   By: Logan Bores M.D.   On: 09/02/2017 07:09    STUDIES:  ECHO 3/3 >> LVEF 65-70%, normal wall motion, grade 1 diastolic dysfunction, moderate AS, mild AR, LA severely dilated, mildly increased PA pressure, trivial pericardial effusion  LE Venous Duplex 3/3 >> negative   CULTURES: BCx2 3/2 >> negative  Strep Pneumo Antigen 3/3 >> negative  RVP 3/3 >> influenza B positive  Tracheal Aspirate 3/5 >> negative  BCx2 3/7 >>  UA 3/7 >> negative  BAL 3/8 >>neg   ANTIBIOTICS: Zosyn 3/2 >> 3/8 Vanco 3/2 >> 3/4  Meropenem 3/8  >>  Vanco 3/8  >> 3/11 SIGNIFICANT EVENTS: 3/02  Admit with SOB, vomited on BiPAP > intubated, ARDS  3/03  Placed on paralytics, requiring pressors 3/04  Remains on paralytics, low dose neo.  Bicarb gtt stopped  3/05  PRBC x1.  Electrolyte disturbances (mg, phos, ca). Tmax 102.2. Paralytics stopped 3/06  7L+  for admit despite lasix. Tmax 100.8.  Neo 50 mcg.  No BM.  PEEP 10, 60% FiO2.  3/07  Ongoing fever 102, abx changed.  Off pressors. Bronch 3/8 unexpectedly showed focal bleeding from right upper lobe, anterior segment Complicated situation.  Suspect respiratory failure is from combination of focal pulmonary hemorrhage, volume overload secondary to valvular heart disease and ARDS from flu infection. 3/9 Reparalysed, proned 3/10 ECMO has been considered.  Will hold off calling [facility] for today as she appears to have improved with proning, paralysis and aggressive diuresis. 3/10 placed supine 3/11 NMB stopped. Lasix changed to q 12 from q 6, weaning PEEP/FIO2 cxr and gas exchange Improved. Stopped vanc.    LINES/TUBES: ETT 3/2 >>  L IJ TLC 3/3 >> Rt radial A line 3/9>>  DISCUSSION:    52 y/o F, smoker, admitted with influenza B & respiratory distress.  Vomited 3/2 and developed ARDS in the setting of flu and suspected aspiration while on BiPAP.     ASSESSMENT / PLAN:  Acute Hypoxic respiratory failure d/t  ARDS - in setting of  influenza B + PNA, prior ARDS / intubation x2  Aspiration PNA  Recent PNA (NOS) Tobacco Abuse  Prior Intubation - 3x per husband, required paralytics Bronchoscopy shows focal hemorrhage from right upper lobe.  -CTA to look at lung and bronchial artery cancelled as she is unstable -PCXR ETT and CVL in good position. Marked improvement in aeration. Still w/ Right > left airspace disease is on-going but improved.  -gas exchange improved on serial abgs -completed tamiflu -wbc and fever curve improved Plan Cont ARDS protocol; weaning PEEP/FIO2 & adjusting Ve PAD protocol goal RASS: -3 as we transition off NMB-->goal -1 to -2 in next 24 hrs Cont lasix as BP/BUN/cr allow CTA of chest when stable to eval bronchial artery (if hemoptysis cont)-->would  need steroid prep and collaboration of timing w. IR (has contrast allergy, hives).  Dc vanc Day # 4/7 meropenem  Hopefully OOB in next 24 hrs to help facilitate weaning    Shock - multifactorial in setting of septic + sedation -->off pressors  Hx Valvular Disease (AS, MS) - per husband had a valve surgery at [facility] CVP goal CVP 8-12 W/ AS need to avoid tachycardia and extreme hypovolemia  Cont lasix as BP/BUN and cr allow   Fluid and electrolyte imbalance: Hypernatremia, hypokalemia ->suspect exacerbated by diuresis  Plan Free water replacement  Potassium replacement  Serial daily BMP Strict I&O  Anemia w/ hemorrhage noted during bronch on 3/8 from RUL -heparin on hold.  -hgb down 1 gm over last 48 hrs  Plan Trend cbc SCDs (holding heparin) Transfuse for HGB < 7   Abdominal Pain - on admit  Vomiting - 3/2 Plan  DM   -excellent glycemic control  Plan ssi as needed  Hx Anxiety, Depression, Panic Attacks, Benzo Dependent  Plan   PAD protocol as above Resume prozac and scheduled xanax via tube in anticipation of weaning gtts  FAMILY  - Updates: family updated daily - Inter-disciplinary family meet or  Palliative Care meeting due by:  3/9    My cct 35 min  Erick Colace ACNP-BC [place] Pager # [phone] OR # [phone] if no answer

## 2017-09-02 NOTE — Progress Notes (Addendum)
Nutrition Follow-up  DOCUMENTATION CODES:   Obesity unspecified  INTERVENTION:    Re-start Vital High Protein @ 20 mL/hr with 60 mL Prostat BID (provides 880 kcal, 1669 kcal with propofol, and 102 g protein)  Free water flushes 200 ml every 4 hours  NUTRITION DIAGNOSIS:   Inadequate oral intake related to inability to eat as evidenced by NPO status.  Ongoing  GOAL:   Provide needs based on ASPEN/SCCM guidelines  Meeting with tube feeding  MONITOR:   Vent status, TF tolerance, Weight trends, Labs  REASON FOR ASSESSMENT:   Consult, Ventilator Enteral/tube feeding initiation and management  ASSESSMENT:   52 y.o. female with medical history significant of mitral stenosis, DM type II, CHF last EF 65-70% with grade 1 diastolic dysfunction  Pt paralyzed 3/3-3/5 and again on 3/9. Paralytic now off. Tube feeding stopped last night after pt was placed in prone position. Plan to restart them today. Pt remains with NGT in place. Pt currently ordered Vital High Protein @ 45 mL/hr with 30 mL Prostat BID which provides 1280 kcal, 124 grams of protein, and 903 mL free water. Will adjust tube feeding rate based off of new ventilator settings and increased propofol rate.   I/O: -390 ml since admit UOP: 2000 ml this morning Weight noted to decrease 19 lb since last RD visit 3/6 (197 lb to 178 lb).   Patient remains intubated on ventilator support MV: 12.6 L/min Temp (24hrs), Avg:99.9 F (37.7 C), Min:99.3 F (37.4 C), Max:100.9 F (38.3 C) BP: 143/64 MAP: 112 Propofol: 29.9 ml/hr- provides 789 kcal  Medications reviewed and include: 60 mg lasix, SSI, Lantus, fentanyl, versed, propofol Labs reviewed: Na 153 (H) K 3.0 (L)   Diet Order:  Diet NPO time specified  EDUCATION NEEDS:   No education needs have been identified at this time  Skin:  Skin Assessment: Reviewed RN Assessment  Last BM:  09/02/17  Height:   Ht Readings from Last 1 Encounters:  08/28/17 _0  (1.676 m)     Weight:   Wt Readings from Last 1 Encounters:  09/02/17 178 lb 9.2 oz (81 kg)    Ideal Body Weight:  59.1 kg  BMI:  Body mass index is 28.82 kg/m.  Estimated Nutritional Needs:   Kcal:  1679 kcal [facility])  Protein:  100-120 g/day (1.2-1.4 g/kg)  Fluid:  >1.6 L/day    Mariana Single RD, LDN Clinical Nutrition Pager # - [phone]

## 2017-09-03 LAB — BASIC METABOLIC PANEL
Anion gap: 8 (ref 5–15)
BUN: 38 mg/dL — AB (ref 6–20)
CO2: 36 mmol/L — ABNORMAL HIGH (ref 22–32)
CREATININE: 0.8 mg/dL (ref 0.44–1.00)
Calcium: 8.8 mg/dL — ABNORMAL LOW (ref 8.9–10.3)
Chloride: 108 mmol/L (ref 101–111)
Glucose, Bld: 127 mg/dL — ABNORMAL HIGH (ref 65–99)
Potassium: 3.8 mmol/L (ref 3.5–5.1)
SODIUM: 152 mmol/L — AB (ref 135–145)

## 2017-09-03 LAB — CULTURE, BLOOD (ROUTINE X 2)
CULTURE: NO GROWTH
CULTURE: NO GROWTH
SPECIAL REQUESTS: ADEQUATE
Special Requests: ADEQUATE

## 2017-09-03 LAB — CBC
HCT: 25.5 % — ABNORMAL LOW (ref 36.0–46.0)
Hemoglobin: 7.3 g/dL — ABNORMAL LOW (ref 12.0–15.0)
MCH: 27.8 pg (ref 26.0–34.0)
MCHC: 28.6 g/dL — AB (ref 30.0–36.0)
MCV: 97 fL (ref 78.0–100.0)
PLATELETS: 453 10*3/uL — AB (ref 150–400)
RBC: 2.63 MIL/uL — ABNORMAL LOW (ref 3.87–5.11)
RDW: 17.1 % — AB (ref 11.5–15.5)
WBC: 11.2 10*3/uL — AB (ref 4.0–10.5)

## 2017-09-03 LAB — MAGNESIUM: MAGNESIUM: 2.3 mg/dL (ref 1.7–2.4)

## 2017-09-03 LAB — GLUCOSE, CAPILLARY
GLUCOSE-CAPILLARY: 109 mg/dL — AB (ref 65–99)
GLUCOSE-CAPILLARY: 125 mg/dL — AB (ref 65–99)
Glucose-Capillary: 79 mg/dL (ref 65–99)
Glucose-Capillary: 134 mg/dL — ABNORMAL HIGH (ref 65–99)
Glucose-Capillary: 83 mg/dL (ref 65–99)
Glucose-Capillary: 141 mg/dL — ABNORMAL HIGH (ref 65–99)

## 2017-09-03 LAB — CULTURE, BAL-QUANTITATIVE W GRAM STAIN
Culture: 1000 — AB
Special Requests: NORMAL

## 2017-09-03 LAB — PHOSPHORUS: Phosphorus: 3 mg/dL (ref 2.5–4.6)

## 2017-09-03 LAB — TRIGLYCERIDES: TRIGLYCERIDES: 268 mg/dL — AB (ref <150)

## 2017-09-03 MED ORDER — FUROSEMIDE 10 MG/ML IJ SOLN: 60.0000 mg | Freq: Two times a day (BID) | INTRAMUSCULAR | Status: DC

## 2017-09-03 MED ORDER — CLONAZEPAM 0.5 MG PO TBDP
1.0000 mg | ORAL_TABLET | Freq: Two times a day (BID) | ORAL | Status: DC
  Administered 2017-09-03 – 2017-09-04 (×3): 1 mg
  Filled 2017-09-03 (×3): qty 2

## 2017-09-03 MED ORDER — FUROSEMIDE 10 MG/ML IJ SOLN
60.0000 mg | Freq: Two times a day (BID) | INTRAMUSCULAR | Status: DC
  Administered 2017-09-03 – 2017-09-04 (×2): 60 mg via INTRAVENOUS
  Filled 2017-09-03 (×2): qty 6

## 2017-09-03 NOTE — Progress Notes (Signed)
PULMONARY / CRITICAL CARE MEDICINE   Name: Holly Figueroa MRN: [id number] DOB: [birth year]    ADMISSION DATE:  08/24/2017 CONSULTATION DATE:  08/24/17  REFERRING MD:  Dr. Tamala Julian / [facility]   CHIEF COMPLAINT:  SOB  BRIEF SUMMARY: 52 year old smoker admitted 3/2 with ARDS due to influenza B, course completed by aspiration, initially paralyzed from 3/3-3/5 but then again on 3/9-3/11  STUDIES:  ECHO 3/3 >> LVEF 65-70%, normal wall motion, grade 1 diastolic dysfunction, moderate AS, mild AR, LA severely dilated, mildly increased PA pressure, trivial pericardial effusion  LE Venous Duplex 3/3 >> negative   CULTURES: BCx2 3/2 >> negative  Strep Pneumo Antigen 3/3 >> negative  RVP 3/3 >> influenza B positive  Tracheal Aspirate 3/5 >> negative  BCx2 3/7 >> ng UA 3/7 >> negative  BAL 3/8 >>neg   ANTIBIOTICS: Zosyn 3/2 >> 3/8 Vanco 3/2 >> 3/4  Meropenem 3/8  >>  Vanco 3/8  >> 3/11   SIGNIFICANT EVENTS: 3/02  Admit with SOB, vomited on BiPAP > intubated, ARDS  3/03  Placed on paralytics, requiring pressors 3/04  Remains on paralytics, low dose neo.  Bicarb gtt stopped  3/05  PRBC x1.  Electrolyte disturbances (mg, phos, ca). Tmax 102.2. Paralytics stopped 3/06  7L+  for admit despite lasix. Tmax 100.8.  Neo 50 mcg.  No BM.  PEEP 10, 60% FiO2.  3/07  Ongoing fever 102, abx changed.  Off pressors. Bronch 3/8 unexpectedly showed focal bleeding from right upper lobe, anterior segment Complicated situation.  Suspect respiratory failure is from combination of focal pulmonary hemorrhage, volume overload secondary to valvular heart disease and ARDS from flu infection. 3/9 Reparalysed, proned 3/10 ECMO has been considered.  Will hold off calling [facility] for today as she appears to have improved with proning, paralysis and aggressive diuresis. 3/10 placed supine 3/11 NMB stopped. Lasix changed to q 12 from q 6, weaning PEEP/FIO2 cxr and gas exchange Improved. Stopped vanc.    LINES/TUBES: ETT 3/2  >>  L IJ TLC 3/3 >> Rt radial A line 3/9>>   SUBJECTIVE:   Low gr febrile Remains critically ill on 60%/PEEP 10 , FIO2 slight higher than yesterday Blood clot suctioned out overnight Good UO with lasix   VITAL SIGNS: BP (!) 118/54   Pulse 67   Temp 100.2 F (37.9 C)   Resp (!) 28   Ht 5' 6" (1.676 m)   Wt 175 lb 0.7 oz (79.4 kg)   SpO2 94%   BMI 28.25 kg/m   HEMODYNAMICS:    VENTILATOR SETTINGS: Vent Mode: PRVC FiO2 (%):  [40 %-80 %] 60 % Set Rate:  [28 bmp] 28 bmp Vt Set:  [360 mL] 360 mL PEEP:  [5 cmH20-14 cmH20] 10 cmH20 Plateau Pressure:  [14 cmH20-22 cmH20] 22 cmH20  INTAKE / OUTPUT: I/O last 3 completed shifts: In: 5028.6 [I.V.:4295.9; NG/GT:132.7; IV Piggyback:600] Out: 9767 [Urine:5625]  PHYSICAL EXAMINATION: Gen: acutely ill,  Sedated on vent  HEENT: NCAT orally intubated. Non-icteric. No JVD Lungs: Bl air entry +  CV:   RRR w/out MRG Abd: soft hypoactive + bowel sounds Ext: brisk CR warm, + pulses. no edema  Skin: warm and dry  Neuro: sedated , RASS-3  LABS:  BMET Recent Labs  Lab 09/01/17 0445 09/02/17 0556 09/03/17 0700  NA 152* 153* 152*  K 3.3* 3.0* 3.8  CL 106 105 108  CO2 35* 38* 36*  BUN 40* 44* 38*  CREATININE 1.04* 1.02* 0.80  GLUCOSE 156* 84 127*  Electrolytes Recent Labs  Lab 09/01/17 0445 09/02/17 0556 09/03/17 0700  CALCIUM 8.4* 8.7* 8.8*  MG 1.9 2.2 2.3  PHOS 2.2* 3.5 3.0    CBC Recent Labs  Lab 09/01/17 0445 09/02/17 0556 09/03/17 0700  WBC 12.4* 10.8* 11.2*  HGB 7.9* 7.8* 7.3*  HCT 28.1* 27.6* 25.5*  PLT 487* 530* 453*    Coag's No results for input(s): APTT, INR in the last 168 hours.  Sepsis Markers Recent Labs  Lab 08/28/17 0315 08/29/17 0525  PROCALCITON 9.23 5.47    ABG Recent Labs  Lab 09/02/17 1150 09/02/17 1252 09/02/17 1649  PHART 7.449 7.460* 7.468*  PCO2ART 57.3* 56.3* 53.6*  PO2ART 130* 73.0* 60.9*    Liver Enzymes No results for input(s): AST, ALT, ALKPHOS,  BILITOT, ALBUMIN in the last 168 hours.  Cardiac Enzymes No results for input(s): TROPONINI, PROBNP in the last 168 hours.  Glucose Recent Labs  Lab 09/02/17 1131 09/02/17 1516 09/02/17 2000 09/02/17 2318 09/03/17 0316 09/03/17 0757  GLUCAP 170* 109* 133* 153* 79 134*    Imaging No results found.  reviewed - improved aeration BL   DISCUSSION:    52 y/o F, smoker, admitted with influenza B , Vomited 3/2 and developed ARDS in the setting of flu and suspected aspiration    ASSESSMENT / PLAN:  Acute Hypoxic respiratory failure d/t  ARDS - in setting of influenza B + PNA, prior ARDS / intubation x2  Aspiration PNA  Recent PNA (NOS) Tobacco Abuse  Prior Intubation - 3x per husband, required paralytics Bronchoscopy shows focal hemorrhage from right upper lobe.  -completed tamiflu  Plan Cont ARDS protocol;  Keep PEEP @ 10 & lower FIO2 Cont lasix  40 q 12 for equal to slight neg balance    Shock - multifactorial in setting of septic + sedation -->off pressors  Hx Valvular Disease (AS, MS) - per husband had a valve surgery at [facility]  goal CVP <8   Fluid and electrolyte imbalance: Hypernatremia, hypokalemia ->suspect exacerbated by diuresis  Plan Ct Free water  Potassium replacement  Serial daily BMP   Aspiration pneumonia Off vanc Day # 5/7 meropenem    Anemia w/ hemorrhage noted during bronch on 3/8 from RUL -heparin on hold.  -hgb drifting down  Plan SCDs (holding heparin) Transfuse for HGB < 7   Abdominal Pain - on admit  Vomiting - 3/2 Plan Ct trickle Tfs while on propofol gtt  DM   -excellent glycemic control  Plan ssi as needed  Hx Anxiety, Depression, Panic Attacks, Benzo Dependent  Plan   PAD protocol, goal RASS -3 Add clonazepam & try to taper versed gtt (on home xanax)  FAMILY  - Updates: family updated daily - Inter-disciplinary family meet or Palliative Care meeting due by:  3/9   The patient is critically ill with  multiple organ systems failure and requires high complexity decision making for assessment and support, frequent evaluation and titration of therapies, application of advanced monitoring technologies and extensive interpretation of multiple databases. Critical Care Time devoted to patient care services described in this note independent of APP/resident  time is 35 minutes.    Kara Mead MD. [place] Flood. [place] [facility] Pager [phone] If no response call [phone]   09/03/2017

## 2017-09-03 NOTE — Progress Notes (Signed)
Pharmacy Antibiotic Note  Holly Figueroa is a 52 y.o. female recently hospitalized and discharged from [facility] on 08/21/17, presented to [facility]  on 08/24/2017 with SOB and subsequently intubated.  Broad abx with vancomycin and zosyn started on admission for sepsis/PNA.  MRSA PCR neg and positive for influ B with vancomycin d/ced on 3/4 and tamiflu started on 3/4, Zosyn continued. Now with ongoing fevers, pharmacy is consulted to change from Zosyn to Meropenem and resume Vancomycin dosing.  Today, 09/03/2017: - Day # 10 antibiotics, #5 Meropenem - Tmax 100.8, low-grade temps - WBC 11.2 (no steroids) - SCr improved to 0.8, CrCl ~ 90 ml/min - PCT 19.3 (3/4) > 9.2 (3/6) > 5.4 (3/7) - All cultures with this admission have been negative thus far  Plan:  Continue Meropenem 1g IV q8h for now per CCM  Vancomycin dc'd  Follow up renal fxn, culture results, and clinical course.  _____________________________________  Height: 5' 6" (167.6 cm) Weight: 175 lb 0.7 oz (79.4 kg) IBW/kg (Calculated) : 59.3  Temp (24hrs), Avg:100.5 F (38.1 C), Min:100 F (37.8 C), Max:100.9 F (38.3 C)  Recent Labs  Lab 08/30/17 0553 08/31/17 0432 09/01/17 0445 09/02/17 0556 09/03/17 0700  WBC 9.0 12.9* 12.4* 10.8* 11.2*  CREATININE 1.04* 1.11* 1.04* 1.02* 0.80    Estimated Creatinine Clearance: 89.4 mL/min (by C-G formula based on SCr of 0.8 mg/dL).    Allergies  Allergen Reactions  . Iodinated Diagnostic Agents Anaphylaxis  . Aspirin Other (See Comments)    "Possible blood in stool" the 339m. Can take 835m  . Ibuprofen Other (See Comments)    "Possible blood in stool"  . Sulfa Antibiotics Hives  . Zofran [Ondansetron Hcl]     Bad headaches  . Dilaudid [Hydromorphone Hcl] Itching and Palpitations  . Hydrocodone Palpitations    Antimicrobials this admission:  3/2 Vancomycin >>3/4, resume 3/8 >> 3/12 3/2 Zosyn  >> 3/8 3/4 tamiflu>>3/9 3/8 meropenem >>   Dose adjustments this admission:    Microbiology results:  2/25ucx: >100K klebsiella pneu (R= amp, I= unasyn, nitro)  3/2 BCx x2: NGF 3/2 MRSA PCR: negative 3/3 resp panel pcr: Influ B+ 3/5 TA: normal flora 3/3 ur legionella: neg   3/7 BCx:  ngtd 3/8 BAL: stain = no orgs; cx 1k yeast 3/8 BAL (PJP): neg 3/8 BAL (AFB) neg  3/8 BAL (fungal):   Thank you for allowing pharmacy to be a part of this patient's care.  ErPeggyann JubaPharmD, BCPS Pager: [phone] 9616/05/2018 8:25 AM

## 2017-09-04 ENCOUNTER — Inpatient Hospital Stay (HOSPITAL_COMMUNITY): Payer: Medicare PPO

## 2017-09-04 DIAGNOSIS — J810 Acute pulmonary edema: Secondary | ICD-10-CM

## 2017-09-04 LAB — CBC
HCT: 26.3 % — ABNORMAL LOW (ref 36.0–46.0)
Hemoglobin: 7.5 g/dL — ABNORMAL LOW (ref 12.0–15.0)
MCH: 27.2 pg (ref 26.0–34.0)
MCHC: 28.5 g/dL — ABNORMAL LOW (ref 30.0–36.0)
MCV: 95.3 fL (ref 78.0–100.0)
PLATELETS: 474 10*3/uL — AB (ref 150–400)
RBC: 2.76 MIL/uL — AB (ref 3.87–5.11)
RDW: 16.7 % — ABNORMAL HIGH (ref 11.5–15.5)
WBC: 11.5 10*3/uL — AB (ref 4.0–10.5)

## 2017-09-04 LAB — BLOOD GAS, ARTERIAL
ACID-BASE EXCESS: 4.1 mmol/L — AB (ref 0.0–2.0)
Bicarbonate: 32.2 mmol/L — ABNORMAL HIGH (ref 20.0–28.0)
DRAWN BY: 441261
FIO2: 100
MECHVT: 360 mL
O2 SAT: 91.8 %
PATIENT TEMPERATURE: 98.6
PCO2 ART: 75.7 mmHg — AB (ref 32.0–48.0)
PEEP/CPAP: 18 cmH2O
RATE: 30 resp/min
pH, Arterial: 7.251 — ABNORMAL LOW (ref 7.350–7.450)
pO2, Arterial: 82.7 mmHg — ABNORMAL LOW (ref 83.0–108.0)

## 2017-09-04 LAB — COOXEMETRY PANEL
Carboxyhemoglobin: 1.4 % (ref 0.5–1.5)
Methemoglobin: 0.7 % (ref 0.0–1.5)
O2 Saturation: 87.4 %
TOTAL HEMOGLOBIN: 8.4 g/dL — AB (ref 12.0–16.0)

## 2017-09-04 LAB — GLUCOSE, CAPILLARY
GLUCOSE-CAPILLARY: 142 mg/dL — AB (ref 65–99)
GLUCOSE-CAPILLARY: 83 mg/dL (ref 65–99)
GLUCOSE-CAPILLARY: 226 mg/dL — AB (ref 65–99)
GLUCOSE-CAPILLARY: 103 mg/dL — AB (ref 65–99)
Glucose-Capillary: 191 mg/dL — ABNORMAL HIGH (ref 65–99)
Glucose-Capillary: 151 mg/dL — ABNORMAL HIGH (ref 65–99)

## 2017-09-04 LAB — BASIC METABOLIC PANEL
ANION GAP: 10 (ref 5–15)
BUN: 37 mg/dL — ABNORMAL HIGH (ref 6–20)
CALCIUM: 8.7 mg/dL — AB (ref 8.9–10.3)
CO2: 33 mmol/L — ABNORMAL HIGH (ref 22–32)
Chloride: 104 mmol/L (ref 101–111)
Creatinine, Ser: 0.74 mg/dL (ref 0.44–1.00)
GFR calc Af Amer: >60 mL/min (ref >60)
Glucose, Bld: 147 mg/dL — ABNORMAL HIGH (ref 65–99)
Potassium: 3.2 mmol/L — ABNORMAL LOW (ref 3.5–5.1)
SODIUM: 147 mmol/L — AB (ref 135–145)

## 2017-09-04 LAB — MAGNESIUM: Magnesium: 2.3 mg/dL (ref 1.7–2.4)

## 2017-09-04 LAB — PHOSPHORUS: Phosphorus: 3.2 mg/dL (ref 2.5–4.6)

## 2017-09-04 MED ORDER — FREE WATER
250.0000 mL | Status: DC
  Administered 2017-09-04 – 2017-09-05 (×5): 250 mL

## 2017-09-04 MED ORDER — CISATRACURIUM BOLUS VIA INFUSION
5.0000 mg | Freq: Once | INTRAVENOUS | Status: AC
  Administered 2017-09-04: 5 mg via INTRAVENOUS
  Filled 2017-09-04: qty 5

## 2017-09-04 MED ORDER — POTASSIUM CHLORIDE 20 MEQ/15ML (10%) PO SOLN
40.0000 meq | Freq: Three times a day (TID) | ORAL | Status: AC
Start: 2017-09-04 — End: 2017-09-04
  Administered 2017-09-04 (×3): 40 meq
  Filled 2017-09-04 (×3): qty 30

## 2017-09-04 MED ORDER — FENTANYL BOLUS VIA INFUSION
50.0000 ug | INTRAVENOUS | Status: DC | PRN
  Administered 2017-09-04 – 2017-09-05 (×2): 50 ug via INTRAVENOUS
  Filled 2017-09-04: qty 50

## 2017-09-04 MED ORDER — FENTANYL CITRATE (PF) 100 MCG/2ML IJ SOLN: 100.0000 ug | Freq: Once | INTRAMUSCULAR | Status: DC | PRN

## 2017-09-04 MED ORDER — DEXMEDETOMIDINE HCL IN NACL 200 MCG/50ML IV SOLN
0.0000 ug/kg/h | INTRAVENOUS | Status: DC
  Administered 2017-09-04: 0.4 ug/kg/h via INTRAVENOUS
  Filled 2017-09-04: qty 100

## 2017-09-04 MED ORDER — VECURONIUM BROMIDE 10 MG IV SOLR
10.0000 mg | Freq: Once | INTRAVENOUS | Status: AC
  Administered 2017-09-04: 10 mg via INTRAVENOUS

## 2017-09-04 MED ORDER — MIDAZOLAM HCL 2 MG/2ML IJ SOLN
2.0000 mg | Freq: Once | INTRAMUSCULAR | Status: AC
  Administered 2017-09-04: 2 mg via INTRAVENOUS
  Filled 2017-09-04: qty 2

## 2017-09-04 MED ORDER — FENTANYL CITRATE (PF) 100 MCG/2ML IJ SOLN: 100.0000 ug | INTRAMUSCULAR | Status: DC | PRN

## 2017-09-04 MED ORDER — MELATONIN 5 MG PO TABS
5.0000 mg | ORAL_TABLET | Freq: Every day | ORAL | Status: DC
  Filled 2017-09-04: qty 1

## 2017-09-04 MED ORDER — VECURONIUM BROMIDE 10 MG IV SOLR
INTRAVENOUS | Status: AC
  Administered 2017-09-04: 10 mg
  Filled 2017-09-04: qty 10

## 2017-09-04 MED ORDER — PROPOFOL 1000 MG/100ML IV EMUL
INTRAVENOUS | Status: AC
  Administered 2017-09-04: 50 ug/kg/min
  Filled 2017-09-04: qty 100

## 2017-09-04 MED ORDER — FENTANYL CITRATE (PF) 100 MCG/2ML IJ SOLN
100.0000 ug | Freq: Once | INTRAMUSCULAR | Status: AC
  Administered 2017-09-04: 100 ug via INTRAVENOUS

## 2017-09-04 MED ORDER — SODIUM CHLORIDE 0.9 % IV SOLN
100.0000 ug/h | INTRAVENOUS | Status: DC
  Administered 2017-09-04 – 2017-09-05 (×4): 300 ug/h via INTRAVENOUS
  Filled 2017-09-04 (×6): qty 50

## 2017-09-04 MED ORDER — ALTEPLASE 2 MG IJ SOLR
2.0000 mg | Freq: Once | INTRAMUSCULAR | Status: AC
  Administered 2017-09-04: 2 mg

## 2017-09-04 MED ORDER — SODIUM CHLORIDE 0.9 % IV SOLN
3.0000 ug/kg/min | INTRAVENOUS | Status: DC
  Administered 2017-09-04: 3 ug/kg/min via INTRAVENOUS
  Administered 2017-09-05: 7 ug/kg/min via INTRAVENOUS
  Administered 2017-09-05: 4 ug/kg/min via INTRAVENOUS
  Administered 2017-09-05: 7 ug/kg/min via INTRAVENOUS
  Administered 2017-09-06: 6 ug/kg/min via INTRAVENOUS
  Administered 2017-09-06: 7.007 ug/kg/min via INTRAVENOUS
  Administered 2017-09-06: 7 ug/kg/min via INTRAVENOUS
  Filled 2017-09-04 (×6): qty 20

## 2017-09-04 MED ORDER — FUROSEMIDE 10 MG/ML IJ SOLN
80.0000 mg | Freq: Four times a day (QID) | INTRAMUSCULAR | Status: DC
  Administered 2017-09-04: 80 mg via INTRAVENOUS
  Filled 2017-09-04: qty 8

## 2017-09-04 MED ORDER — ARTIFICIAL TEARS OPHTHALMIC OINT
1.0000 "application " | TOPICAL_OINTMENT | Freq: Three times a day (TID) | OPHTHALMIC | Status: DC
  Administered 2017-09-04 – 2017-09-06 (×7): 1 via OPHTHALMIC
  Filled 2017-09-04 (×3): qty 3.5

## 2017-09-04 MED ORDER — VECURONIUM BROMIDE 10 MG IV SOLR
INTRAVENOUS | Status: AC
  Filled 2017-09-04: qty 10

## 2017-09-04 MED ORDER — SODIUM CHLORIDE 3 % IN NEBU
4.0000 mL | INHALATION_SOLUTION | Freq: Every day | RESPIRATORY_TRACT | Status: DC
  Administered 2017-09-04: 4 mL via RESPIRATORY_TRACT
  Filled 2017-09-04 (×2): qty 4

## 2017-09-04 MED ORDER — PROPOFOL 1000 MG/100ML IV EMUL
25.0000 ug/kg/min | INTRAVENOUS | Status: DC
  Administered 2017-09-04 (×3): 60 ug/kg/min via INTRAVENOUS
  Administered 2017-09-05: 61 ug/kg/min via INTRAVENOUS
  Administered 2017-09-05: 70 ug/kg/min via INTRAVENOUS
  Administered 2017-09-05: 61 ug/kg/min via INTRAVENOUS
  Administered 2017-09-05 (×2): 75 ug/kg/min via INTRAVENOUS
  Administered 2017-09-05: 70 ug/kg/min via INTRAVENOUS
  Administered 2017-09-05 – 2017-09-06 (×6): 75 ug/kg/min via INTRAVENOUS
  Filled 2017-09-04: qty 100
  Filled 2017-09-04: qty 200
  Filled 2017-09-04 (×2): qty 100
  Filled 2017-09-04 (×2): qty 200
  Filled 2017-09-04 (×6): qty 100

## 2017-09-04 MED ORDER — PHENYLEPHRINE HCL-NACL 10-0.9 MG/250ML-% IV SOLN
0.0000 ug/min | INTRAVENOUS | Status: DC
  Administered 2017-09-04: 10 ug/min via INTRAVENOUS
  Filled 2017-09-04: qty 500

## 2017-09-04 NOTE — Procedures (Signed)
Arterial Catheter Insertion Procedure Note Danaija Eskridge [place]bourn [id number] 06-10-1966  Procedure: Insertion of Arterial Catheter  Indications: Blood pressure monitoring and Frequent blood sampling  Procedure Details Consent: Risks of procedure as well as the alternatives and risks of each were explained to the (patient/caregiver).  Consent for procedure obtained. Time Out: Verified patient identification, verified procedure, site/side was marked, verified correct patient position, special equipment/implants available, medications/allergies/relevent history reviewed, required imaging and test results available.  Performed  Maximum sterile technique was used including antiseptics, cap, gloves, gown, hand hygiene, mask and sheet. Skin prep: Chlorhexidine; local anesthetic administered 20 gauge catheter was inserted into left radial artery using the Seldinger technique.  Evaluation Blood flow good; BP tracing good. Complications: No apparent complications.   Lamonte Sakai 09/04/2017

## 2017-09-04 NOTE — Progress Notes (Signed)
RT called to bedside to assess patient on vent that is desaturating. Upon arrival, patient on +14 of PEEP and 100% FiO2; O2 sats at this time were 87%. Patient recruitment maneuver performed and O2 sat increased to 96%. Patient returned to the previous [place] settings. O2 sat began to trend down to 85-87% and PCCM NP paged. Upon speaking to NP, patient PEEP increased to +16 with a FiO2 of 100%. Per NP, wean O2 requirements as tolerated by patient. Futures trader at bedside with RT. RT will continue to monitor patient.

## 2017-09-04 NOTE — Progress Notes (Signed)
Notified [place] NP at approximately 1250 in regards to patient increasing work of breathing and desaturating to mid 61s. Marni Griffon NP ordered to give 35m of versed IV. Also notified PMarni GriffonNP in regards to not able to wean off fentanyl IV infusion and on 0.8 mcg of precedex. Also Respiratory therapist MJinny Blossomcalled to beside to assist with patient due to respiratory status. MD AElsworth Sohocame to bedside and ordered  to stop precedex infusion and began propofol infusion at 1315. Then MD AElsworth Sohoordered to give 183mof vecoronium IV at 1341.

## 2017-09-04 NOTE — Progress Notes (Addendum)
She was down to 50% and PEEP of 8 this morning and more awake. By Oxygenation had worsened with sudden desaturation to the 60s, sustained in the 50s in spite of increasing PEEP to 18 and FiO2 100% and aggressive suctioning.  Chest x-ray obtained which showed increasing bilateral airspace disease with consolidation left lower lobe.  She was finally turned over left side up and this seemed to improve her oxygenation.  She was sedated initially with tomograms of Versed then Precedex was discontinued and propofol started and a 5 cc bolus given.  She was then paralyzed with vecuronium 10 mg.  She required a second dose of vecuronium within 2 hours and was then placed on a Nimbex drip By 5 PM, oxygenation had improved, ABG showed mild respiratory acidosis.  Failure/pulmonary edema, prior echoes were reviewed I discussed with cardiology Dr. Einar Gip and advanced heart failure service, she will probably need a right heart cath but unable to do in the semiprone position.  I discussed with the transfer line at [facility] and with Dr. Tobie Poet MICU attending and CCU fellow.  She was placed on the transfer list but no beds available currently.  Family including husband was updated in great detail  Critical care time x 90 minues  Rakesh V. Elsworth Soho MD

## 2017-09-04 NOTE — Progress Notes (Signed)
PULMONARY / CRITICAL CARE MEDICINE   Name: Holly Figueroa MRN: [id number] DOB: 03/22/1966    ADMISSION DATE:  08/24/2017 CONSULTATION DATE:  08/24/17  REFERRING MD:  Dr. Tamala Julian / [facility]   CHIEF COMPLAINT:  SOB  BRIEF SUMMARY: 52 year old smoker admitted 3/2 with ARDS due to influenza B, course completed by aspiration, initially paralyzed from 3/3-3/5 but then again on 3/9-3/11  STUDIES:  ECHO 3/3 >> LVEF 65-70%, normal wall motion, grade 1 diastolic dysfunction, moderate AS, mild AR, LA severely dilated, mildly increased PA pressure, trivial pericardial effusion  LE Venous Duplex 3/3 >> negative   CULTURES: BCx2 3/2 >> negative  Strep Pneumo Antigen 3/3 >> negative  RVP 3/3 >> influenza B positive  Tracheal Aspirate 3/5 >> negative  BCx2 3/7 >> ng UA 3/7 >> negative  BAL 3/8 >>neg   ANTIBIOTICS: Zosyn 3/2 >> 3/8 Vanco 3/2 >> 3/4  Meropenem 3/8  >>  Vanco 3/8  >> 3/11   SIGNIFICANT EVENTS: 3/02  Admit with SOB, vomited on BiPAP > intubated, ARDS  3/03  Placed on paralytics, requiring pressors 3/04  Remains on paralytics, low dose neo.  Bicarb gtt stopped  3/05  PRBC x1.  Electrolyte disturbances (mg, phos, ca). Tmax 102.2. Paralytics stopped 3/06  7L+  for admit despite lasix. Tmax 100.8.  Neo 50 mcg.  No BM.  PEEP 10, 60% FiO2.  3/07  Ongoing fever 102, abx changed.  Off pressors. Bronch 3/8 unexpectedly showed focal bleeding from right upper lobe, anterior segment Complicated situation.  Suspect respiratory failure is from combination of focal pulmonary hemorrhage, volume overload secondary to valvular heart disease and ARDS from flu infection. 3/9 Reparalysed, proned 3/10 ECMO has been considered.  Will hold off calling [facility] for today as she appears to have improved with proning, paralysis and aggressive diuresis. 3/10 placed supine 3/11 NMB stopped. Lasix changed to q 12 from q 6, weaning PEEP/FIO2 cxr and gas exchange Improved. Stopped vanc.  3/13 stopping sedating  drips, working towards weaning  LINES/TUBES: ETT 3/2 >>  L IJ TLC 3/3 >> Rt radial A line 3/9>>   SUBJECTIVE:   No issues overnight  VITAL SIGNS: BP (!) 82/50 (BP Location: Left Arm)   Pulse 66   Temp 99.3 F (37.4 C) (Bladder)   Resp (!) 28   Ht _0  (1.676 m)   Wt 181 lb 7 oz (82.3 kg)   SpO2 99%   BMI 29.28 kg/m   HEMODYNAMICS:    VENTILATOR SETTINGS: Vent Mode: PRVC FiO2 (%):  [50 %-55 %] 50 % Set Rate:  [25 bmp-28 bmp] 28 bmp Vt Set:  [360 mL] 360 mL PEEP:  [8 cmH20-10 cmH20] 8 cmH20 Plateau Pressure:  [21 cmH20-25 cmH20] 21 cmH20  INTAKE / OUTPUT:  Intake/Output Summary (Last 24 hours) at 09/04/2017 1048 Last data filed at 09/04/2017 0804 Gross per 24 hour  Intake 3748.32 ml  Output 2851 ml  Net 897.32 ml     PHYSICAL EXAMINATION: Critically ill-appearing 52 year old female currently awake, interactive on ventilator HEENT: Normocephalic atraumatic mucous membranes are moist, orally intubated Pulmonary: Scattered rhonchi equal chest rise no accessory use Cardiac: Regular rate and rhythm without murmur rub or gallop Abdomen: Soft nontender no organomegaly Extremities/musculoskeletal: Warm and dry, dependent edema, strong pulses Neuro/psych: Moves all extremities, generalized weakness, nodding appropriately. GU: Clear yellow urine LABS:  BMET Recent Labs  Lab 09/02/17 0556 09/03/17 0700 09/04/17 0413  NA 153* 152* 147*  K 3.0* 3.8 3.2*  CL 105 108 104  CO2 38* 36* 33*  BUN 44* 38* 37*  CREATININE 1.02* 0.80 0.74  GLUCOSE 84 127* 147*    Electrolytes Recent Labs  Lab 09/02/17 0556 09/03/17 0700 09/04/17 0413  CALCIUM 8.7* 8.8* 8.7*  MG 2.2 2.3 2.3  PHOS 3.5 3.0 3.2    CBC Recent Labs  Lab 09/02/17 0556 09/03/17 0700 09/04/17 0413  WBC 10.8* 11.2* 11.5*  HGB 7.8* 7.3* 7.5*  HCT 27.6* 25.5* 26.3*  PLT 530* 453* 474*    Coag's No results for input(s): APTT, INR in the last 168 hours.  Sepsis Markers Recent Labs  Lab  08/29/17 0525  PROCALCITON 5.47    ABG Recent Labs  Lab 09/02/17 1150 09/02/17 1252 09/02/17 1649  PHART 7.449 7.460* 7.468*  PCO2ART 57.3* 56.3* 53.6*  PO2ART 130* 73.0* 60.9*    Liver Enzymes No results for input(s): AST, ALT, ALKPHOS, BILITOT, ALBUMIN in the last 168 hours.  Cardiac Enzymes No results for input(s): TROPONINI, PROBNP in the last 168 hours.  Glucose Recent Labs  Lab 09/03/17 1206 09/03/17 1609 09/03/17 1940 09/03/17 2317 09/04/17 0316 09/04/17 0731  GLUCAP 109* 125* 83 141* 142* 83    Imaging Dg Chest Port 1 View  Result Date: 09/04/2017 CLINICAL DATA:  Hypoxia EXAM: PORTABLE CHEST 1 VIEW COMPARISON:  September 02, 2017 FINDINGS: Endotracheal tube tip is 2.7 cm above the carina. Nasogastric tube tip and side port are below the diaphragm. Central catheter tip is in the superior vena cava near the cavoatrial junction. No pneumothorax. There are layering pleural effusions bilaterally. There is patchy airspace opacity throughout both mid and lower lung zones, similar to 1 day prior. There is cardiomegaly with pulmonary venous hypertension. No adenopathy. There is aortic atherosclerosis. No bone lesions. There is calcification in both carotid arteries. IMPRESSION: There is underlying pulmonary vascular congestion. Bilateral effusions with opacity bilaterally, likely pulmonary edema. A degree of superimposed pneumonia cannot be entirely excluded. Appearance similar to 2 days prior allowing for some difference in technique. Tube and catheter positions as described without pneumothorax. Electronically Signed   By: Lowella Grip III M.D.   On: 09/04/2017 07:13    reviewed - improved aeration BL   DISCUSSION:    52 y/o F, smoker, admitted with influenza B , Vomited 3/2 and developed ARDS in the setting of flu and suspected aspiration    ASSESSMENT / PLAN:  Acute Hypoxic respiratory failure d/t  ARDS - in setting of influenza B + PNA, prior ARDS / intubation  x2  Aspiration PNA  Tobacco Abuse Bronchoscopy shows focal hemorrhage from right upper lobe.  -Prior Intubation - 3x per husband, required paralytics -completed tamiflu -weaning FIO2/peep -Fever curve and white blood cell stable -pcxr personally reviewed: R>L airspace disease no sig change  Plan Cont ARDS protocol  Wean PEEP/FIO2  RASS goal 0 OOB Increase lasix to 80 q 6 x 3 Day 6/7 meropenem  Repeat am cxr   Hx Valvular Disease (AS, MS) - per husband had a valve surgery at [facility] HTN Shock(resovled) - multifactorial in setting of septic + sedation -->off pressors  Plan Lasix  Mobilize  Cont tele   Fluid and electrolyte imbalance: Hypernatremia, hypokalemia ->suspect exacerbated by diuresis  Plan Replace K Free water via tube (increased) Repeat am chem   Anemia w/ hemorrhage noted during bronch on 3/8 from RUL -heparin on hold.  -hgb drifting down but stable  Plan SCDs Holding heparin Transfuse per protocol   Abdominal Pain - on admit  Vomiting - 3/2  Plan Adv tubefeeds to goal   DM   -excellent glycemic control  Plan ssi as needed  Hx Anxiety, Depression, Panic Attacks, Benzo Dependent  Plan   Change RASS goal to 0 Add precedex and stop diprivan  Cont clonazepam  Stop versed and fent (changing fent to PRN) OOB Melatonin at HS Frequent re-orientation    FAMILY  - Updates: family updated daily - Inter-disciplinary family meet or Palliative Care meeting due by:  3/9  Critical care times 45 minutes  Erick Colace ACNP-BC [place] Pager # [phone] OR # [phone] if no answer  09/04/2017

## 2017-09-05 ENCOUNTER — Inpatient Hospital Stay (HOSPITAL_COMMUNITY): Payer: Medicare PPO

## 2017-09-05 ENCOUNTER — Encounter (HOSPITAL_COMMUNITY)
Admission: AD | Disposition: A | Discharge status: Home Health | Payer: Self-pay | Source: Other Acute Inpatient Hospital | Attending: Pulmonary Disease

## 2017-09-05 DIAGNOSIS — R579 Shock, unspecified: Secondary | ICD-10-CM

## 2017-09-05 LAB — BASIC METABOLIC PANEL
ANION GAP: 9 (ref 5–15)
BUN: 34 mg/dL — ABNORMAL HIGH (ref 6–20)
CALCIUM: 8.5 mg/dL — AB (ref 8.9–10.3)
CO2: 33 mmol/L — AB (ref 22–32)
CREATININE: 0.71 mg/dL (ref 0.44–1.00)
Chloride: 106 mmol/L (ref 101–111)
GFR calc Af Amer: >60 mL/min (ref >60)
Glucose, Bld: 137 mg/dL — ABNORMAL HIGH (ref 65–99)
Potassium: 4.3 mmol/L (ref 3.5–5.1)
SODIUM: 148 mmol/L — AB (ref 135–145)

## 2017-09-05 LAB — BLOOD GAS, ARTERIAL
ACID-BASE EXCESS: 7.7 mmol/L — AB (ref 0.0–2.0)
ACID-BASE EXCESS: 5.6 mmol/L — AB (ref 0.0–2.0)
BICARBONATE: 33.1 mmol/L — AB (ref 20.0–28.0)
Bicarbonate: 32.8 mmol/L — ABNORMAL HIGH (ref 20.0–28.0)
Drawn by: 235321
Drawn by: 33100
FIO2: 100
FIO2: 80
MECHVT: 360 mL
O2 SAT: 100 %
O2 Saturation: 98 %
PEEP/CPAP: 18 cmH2O
PEEP: 16 cmH2O
PH ART: 7.388 (ref 7.350–7.450)
PH ART: 7.224 — AB (ref 7.350–7.450)
Patient temperature: 99.3
Patient temperature: 99
RATE: 30 resp/min
RATE: 35 resp/min
VT: 360 mL
pCO2 arterial: 56.4 mmHg — ABNORMAL HIGH (ref 32.0–48.0)
pCO2 arterial: 82.8 mmHg (ref 32.0–48.0)
pO2, Arterial: 411 mmHg — ABNORMAL HIGH (ref 83.0–108.0)
pO2, Arterial: 135 mmHg — ABNORMAL HIGH (ref 83.0–108.0)

## 2017-09-05 LAB — POCT I-STAT 3, ART BLOOD GAS (G3+)
ACID-BASE EXCESS: 10 mmol/L — AB (ref 0.0–2.0)
Acid-Base Excess: 8 mmol/L — ABNORMAL HIGH (ref 0.0–2.0)
BICARBONATE: 36.7 mmol/L — AB (ref 20.0–28.0)
Bicarbonate: 37.4 mmol/L — ABNORMAL HIGH (ref 20.0–28.0)
O2 SAT: 100 %
O2 Saturation: 94 %
PH ART: 7.319 — AB (ref 7.350–7.450)
PO2 ART: 82 mmHg — AB (ref 83.0–108.0)
Patient temperature: 37.3
TCO2: 39 mmol/L — AB (ref 22–32)
TCO2: 40 mmol/L — AB (ref 22–32)
pCO2 arterial: 76.5 mmHg (ref 32.0–48.0)
pCO2 arterial: 72.8 mmHg (ref 32.0–48.0)
pH, Arterial: 7.29 — ABNORMAL LOW (ref 7.350–7.450)
pO2, Arterial: 225 mmHg — ABNORMAL HIGH (ref 83.0–108.0)

## 2017-09-05 LAB — GLUCOSE, CAPILLARY
GLUCOSE-CAPILLARY: 126 mg/dL — AB (ref 65–99)
GLUCOSE-CAPILLARY: 175 mg/dL — AB (ref 65–99)
GLUCOSE-CAPILLARY: 207 mg/dL — AB (ref 65–99)
GLUCOSE-CAPILLARY: 214 mg/dL — AB (ref 65–99)
Glucose-Capillary: 122 mg/dL — ABNORMAL HIGH (ref 65–99)
Glucose-Capillary: 222 mg/dL — ABNORMAL HIGH (ref 65–99)

## 2017-09-05 LAB — COOXEMETRY PANEL
Carboxyhemoglobin: 0.9 % (ref 0.5–1.5)
Methemoglobin: 1.5 % (ref 0.0–1.5)
O2 SAT: 81.6 %
TOTAL HEMOGLOBIN: 11.3 g/dL — AB (ref 12.0–16.0)

## 2017-09-05 LAB — CBC
HCT: 25.3 % — ABNORMAL LOW (ref 36.0–46.0)
Hemoglobin: 7.1 g/dL — ABNORMAL LOW (ref 12.0–15.0)
MCH: 27.5 pg (ref 26.0–34.0)
MCHC: 28.1 g/dL — ABNORMAL LOW (ref 30.0–36.0)
MCV: 98.1 fL (ref 78.0–100.0)
PLATELETS: 465 10*3/uL — AB (ref 150–400)
RBC: 2.58 MIL/uL — ABNORMAL LOW (ref 3.87–5.11)
RDW: 16.3 % — ABNORMAL HIGH (ref 11.5–15.5)
WBC: 11.2 10*3/uL — ABNORMAL HIGH (ref 4.0–10.5)

## 2017-09-05 LAB — HEMOGLOBIN AND HEMATOCRIT, BLOOD
HEMATOCRIT: 29.8 % — AB (ref 36.0–46.0)
Hemoglobin: 8.8 g/dL — ABNORMAL LOW (ref 12.0–15.0)

## 2017-09-05 LAB — PROTIME-INR
INR: 1.19
PROTHROMBIN TIME: 15 s (ref 11.4–15.2)

## 2017-09-05 LAB — PHOSPHORUS: Phosphorus: 2.8 mg/dL (ref 2.5–4.6)

## 2017-09-05 LAB — PREPARE RBC (CROSSMATCH)

## 2017-09-05 LAB — ABO/RH: ABO/RH(D): O POS

## 2017-09-05 LAB — PROCALCITONIN: Procalcitonin: 3.58 ng/mL

## 2017-09-05 LAB — MAGNESIUM: MAGNESIUM: 2.1 mg/dL (ref 1.7–2.4)

## 2017-09-05 SURGERY — LEFT HEART CATH AND CORONARY ANGIOGRAPHY
Anesthesia: LOCAL

## 2017-09-05 MED ORDER — FREE WATER
100.0000 mL | Status: DC
  Administered 2017-09-05 – 2017-09-10 (×31): 100 mL

## 2017-09-05 MED ORDER — SODIUM CHLORIDE 0.9 % IV SOLN: 250.0000 mL | INTRAVENOUS | Status: DC | PRN

## 2017-09-05 MED ORDER — DEXTROSE 5 % IV SOLN
INTRAVENOUS | Status: DC
Start: 2017-09-05 — End: 2017-09-06

## 2017-09-05 MED ORDER — METHYLPREDNISOLONE SODIUM SUCC 125 MG IJ SOLR
125.0000 mg | Freq: Once | INTRAMUSCULAR | Status: AC
  Administered 2017-09-05: 125 mg via INTRAVENOUS
  Filled 2017-09-05: qty 2

## 2017-09-05 MED ORDER — METOPROLOL TARTRATE 5 MG/5ML IV SOLN
2.5000 mg | Freq: Four times a day (QID) | INTRAVENOUS | Status: DC
  Administered 2017-09-05 – 2017-09-06 (×3): 2.5 mg via INTRAVENOUS
  Filled 2017-09-05 (×3): qty 5

## 2017-09-05 MED ORDER — IPRATROPIUM-ALBUTEROL 0.5-2.5 (3) MG/3ML IN SOLN: 3.0000 mL | RESPIRATORY_TRACT | Status: DC | PRN

## 2017-09-05 MED ORDER — ASPIRIN 81 MG PO CHEW: 81.0000 mg | CHEWABLE_TABLET | ORAL | Status: DC

## 2017-09-05 MED ORDER — FAMOTIDINE IN NACL 20-0.9 MG/50ML-% IV SOLN: 20.0000 mg | Freq: Once | INTRAVENOUS | Status: DC

## 2017-09-05 MED ORDER — FUROSEMIDE 10 MG/ML IJ SOLN: 60.0000 mg | Freq: Once | INTRAMUSCULAR | Status: AC

## 2017-09-05 MED ORDER — SODIUM CHLORIDE 0.9% FLUSH: 3.0000 mL | INTRAVENOUS | Status: DC | PRN

## 2017-09-05 MED ORDER — SODIUM CHLORIDE 0.9% FLUSH
3.0000 mL | Freq: Two times a day (BID) | INTRAVENOUS | Status: DC
  Administered 2017-09-05 – 2017-09-06 (×3): 3 mL via INTRAVENOUS

## 2017-09-05 MED ORDER — SODIUM CHLORIDE 0.9 % IV SOLN: INTRAVENOUS | Status: AC

## 2017-09-05 MED ORDER — FUROSEMIDE 10 MG/ML IJ SOLN
INTRAMUSCULAR | Status: AC
  Administered 2017-09-05: 60 mg
  Filled 2017-09-05: qty 8

## 2017-09-05 MED ORDER — LABETALOL HCL 5 MG/ML IV SOLN
10.0000 mg | INTRAVENOUS | Status: DC | PRN
  Administered 2017-09-09: 10 mg via INTRAVENOUS
  Filled 2017-09-05 (×2): qty 4

## 2017-09-05 MED ORDER — DIPHENHYDRAMINE HCL 50 MG/ML IJ SOLN: 25.0000 mg | Freq: Once | INTRAMUSCULAR | Status: DC

## 2017-09-05 MED ORDER — FUROSEMIDE 10 MG/ML IJ SOLN
60.0000 mg | Freq: Once | INTRAMUSCULAR | Status: AC
  Administered 2017-09-05: 60 mg via INTRAVENOUS
  Filled 2017-09-05: qty 6

## 2017-09-05 MED ORDER — SODIUM CHLORIDE 0.9 % IV SOLN: Freq: Once | INTRAVENOUS | Status: DC

## 2017-09-05 MED ORDER — CHLORHEXIDINE GLUCONATE CLOTH 2 % EX PADS
6.0000 | MEDICATED_PAD | Freq: Every day | CUTANEOUS | Status: DC
  Administered 2017-09-05 – 2017-09-07 (×3): 6 via TOPICAL

## 2017-09-05 NOTE — Progress Notes (Signed)
PULMONARY / CRITICAL CARE MEDICINE   Name: Holly Figueroa MRN: [id number] DOB: Feb 20, 1966    ADMISSION DATE:  08/24/2017 CONSULTATION DATE:  08/24/17  REFERRING MD:  Dr. Tamala Julian / [facility]   CHIEF COMPLAINT:  SOB  BRIEF SUMMARY: 52 year old smoker admitted 3/2 with ARDS due to influenza B, course completed by aspiration, initially paralyzed from 3/3-3/5 but then again on 3/9-3/11  STUDIES:  ECHO 3/3 >> LVEF 65-70%, normal wall motion, grade 1 diastolic dysfunction, moderate AS, mild AR, LA severely dilated, mildly increased PA pressure, trivial pericardial effusion  LE Venous Duplex 3/3 >> negative   CULTURES: BCx2 3/2 >> negative  Strep Pneumo Antigen 3/3 >> negative  RVP 3/3 >> influenza B positive  Tracheal Aspirate 3/5 >> negative  BCx2 3/7 >> ng UA 3/7 >> negative  BAL 3/8 >>neg   ANTIBIOTICS: Zosyn 3/2 >> 3/8 Vanco 3/2 >> 3/4  Meropenem 3/8  >>  Vanco 3/8  >> 3/11   SIGNIFICANT EVENTS: 3/02  Admit with SOB, vomited on BiPAP > intubated, ARDS  3/03  Placed on paralytics, requiring pressors 3/04  Remains on paralytics, low dose neo.  Bicarb gtt stopped  3/05  PRBC x1.  Electrolyte disturbances (mg, phos, ca). Tmax 102.2. Paralytics stopped 3/06  7L+  for admit despite lasix. Tmax 100.8.  Neo 50 mcg.  No BM.  PEEP 10, 60% FiO2.  3/07  Ongoing fever 102, abx changed.  Off pressors. Bronch 3/8 unexpectedly showed focal bleeding from right upper lobe, anterior segment Complicated situation.  Suspect respiratory failure is from combination of focal pulmonary hemorrhage, volume overload secondary to valvular heart disease and ARDS from flu infection. 3/9 Reparalysed, proned 3/10 ECMO has been considered.  Will hold off calling [facility] for today as she appears to have improved with proning, paralysis and aggressive diuresis. 3/10 placed supine 3/11 NMB stopped. Lasix changed to q 12 from q 6, weaning PEEP/FIO2 cxr and gas exchange Improved. Stopped vanc.  3/13 stopping sedating  drips, working towards weaning, then developed sudden onset of worsening hypoxia requiring rapidly escalated PEEP.  Chest x-ray showing new onset pulmonary edema 3/14 placing PA catheter and then dissipating transition to [facility]  LINES/TUBES: ETT 3/2 >>  L IJ TLC 3/3 >> Rt radial A line 3/9>>   SUBJECTIVE:   Sedated and paralyzed  VITAL SIGNS: BP (!) 112/44 (BP Location: Left Arm)   Pulse 63   Temp 99 F (37.2 C)   Resp (!) 30   Ht _0  (1.676 m)   Wt 181 lb 7 oz (82.3 kg)   SpO2 100%   BMI 29.28 kg/m   HEMODYNAMICS: CVP:  [13 mmHg] 13 mmHg  VENTILATOR SETTINGS: Vent Mode: PRVC FiO2 (%):  [50 %-100 %] 70 % Set Rate:  [28 bmp-30 bmp] 30 bmp Vt Set:  [360 mL] 360 mL PEEP:  [14 cmH20-18 cmH20] 14 cmH20 Plateau Pressure:  [20 cmH20-35 cmH20] 26 cmH20  INTAKE / OUTPUT:  Intake/Output Summary (Last 24 hours) at 09/05/2017 1005 Last data filed at 09/05/2017 0600 Gross per 24 hour  Intake 3480.05 ml  Output 2995 ml  Net 485.05 ml     PHYSICAL EXAMINATION: General: Is critically ill appearing 52 year old female currently heavily sedated and on neuromuscular blockade HEENT: Normocephalic atraumatic orally intubated Pulmonary: Diffuse rhonchi equal chest rise Cardiac: Regular rate and rhythm Abdomen: Soft nontender Extremities: Dependent edema otherwise warm and dry strong pulses Neuro: Heavily sedated and on neuromuscular blockade infusion GU: Clear yellow LABS:  BMET Recent Labs  Lab 09/03/17 0700 09/04/17 0413 09/05/17 0454  NA 152* 147* 148*  K 3.8 3.2* 4.3  CL 108 104 106  CO2 36* 33* 33*  BUN 38* 37* 34*  CREATININE 0.80 0.74 0.71  GLUCOSE 127* 147* 137*    Electrolytes Recent Labs  Lab 09/03/17 0700 09/04/17 0413 09/05/17 0454  CALCIUM 8.8* 8.7* 8.5*  MG 2.3 2.3 2.1  PHOS 3.0 3.2 2.8    CBC Recent Labs  Lab 09/03/17 0700 09/04/17 0413 09/05/17 0454  WBC 11.2* 11.5* 11.2*  HGB 7.3* 7.5* 7.1*  HCT 25.5* 26.3* 25.3*  PLT 453* 474* 465*     Coag's No results for input(s): APTT, INR in the last 168 hours.  Sepsis Markers No results for input(s): LATICACIDVEN, PROCALCITON, O2SATVEN in the last 168 hours.  ABG Recent Labs  Lab 09/02/17 1649 09/04/17 1518 09/05/17 0342  PHART 7.468* 7.251* 7.388  PCO2ART 53.6* 75.7* 56.4*  PO2ART 60.9* 82.7* 411*    Liver Enzymes No results for input(s): AST, ALT, ALKPHOS, BILITOT, ALBUMIN in the last 168 hours.  Cardiac Enzymes No results for input(s): TROPONINI, PROBNP in the last 168 hours.  Glucose Recent Labs  Lab 09/04/17 1140 09/04/17 1602 09/04/17 2028 09/04/17 2321 09/05/17 0317 09/05/17 0752  GLUCAP 191* 226* 103* 151* 126* 122*    Imaging Dg Chest 1 View  Result Date: 09/04/2017 CLINICAL DATA:  Oxygen desaturation. EXAM: CHEST  1 VIEW COMPARISON:  Chest x-ray from same day at 4:39 a.m. FINDINGS: Unchanged endotracheal and enteric tubes. Unchanged left internal jugular central venous catheter with the tip near the cavoatrial junction. Stable mild cardiomegaly. Worsening hazy opacities throughout the bilateral lungs with air bronchograms. Layering bilateral pleural effusions are unchanged. No pneumothorax. No acute osseous abnormality. IMPRESSION: Worsening diffuse airspace disease throughout both lungs, concerning for ARDS. Electronically Signed   By: Titus Dubin M.D.   On: 09/04/2017 13:35   Dg Chest Port 1 View  Result Date: 09/05/2017 CLINICAL DATA:  ARDS. EXAM: PORTABLE CHEST 1 VIEW COMPARISON:  09/04/2017 and older studies. FINDINGS: Airspace opacity has improved on the left, specifically the left mid to lower lung zone. Left hemidiaphragm is not defined. However, airspace opacity on the right, peripherally in the mid to lower lung zone and at the base, has increased, now obscuring the right hemidiaphragm. This changing pattern of lung consolidation suggest pulmonary edema as the etiology. No pneumothorax. Lines and tubes are stable and well positioned.  IMPRESSION: Airspace lung opacities show a change in pattern, now greater on the right than the left as detailed above. This supports pulmonary edema as the etiology, with the changes likely related to changes in patient positioning. No pneumothorax. Stable well-positioned support apparatus. Electronically Signed   By: Lajean Manes M.D.   On: 09/05/2017 09:08    reviewed - improved aeration BL   DISCUSSION:    52 y/o F, smoker, admitted with influenza B , Vomited 3/2 and developed ARDS in the setting of flu and suspected aspiration-->now w/ flash pulmonary edema     ASSESSMENT / PLAN:  Acute Hypoxic respiratory failure d/t  ARDS - in setting of influenza B + PNA-->now w/ flash pulmonary edema and worsening FIO2/peep requirements on 3/13 (acutely) Tobacco Abuse Bronchoscopy shows focal hemorrhage from right upper lobe.  -Prior Intubation - 3x per husband, required paralytics -completed tamiflu -PCXR review: now w/ right > left pulmonary edema pattern c/w left > right yesterday  Plan Cont low tidal vol ventilation Hold PEEP at 14 and wean FIO2  PAD protocol w/ RAS goal -4; on day 2/3 NMB.  Evaluate hemodynamics -->anticipate further diuresis Day # 7/ meropenem    Hx Valvular Disease (AS, MS) - per husband had a valve surgery at [facility]. Suspect cardiogenic. Acting like HOCM Plan Cont tele Place PAC-->titrate hemodynamic support based on PAC parameters Plan to transfer to [facility] for advanced heart failure assist as well as prob needs left heart cath  Ck sc[facility]2 from [facility]   Fluid and electrolyte imbalance: Hypernatremia, hypokalemia ->suspect exacerbated by diuresis  Plan Replace free water F/u am chemistry    Anemia w/ hemorrhage noted during bronch on 3/8 from RUL -heparin on hold.  -hgb drifting down but stable  Plan scds Holding heparin  Serial cbcs  Abdominal Pain - on admit  Vomiting - 3/2 Plan tubefeeds as tol   DM   -excellent glycemic control  Plan ssi as  needed  Hx Anxiety, Depression, Panic Attacks, Benzo Dependent  New sedation needs for critical illness 3/13 Plan   RASS goal -4 While on NMB D/c melatonin and clonazepam for now-->resume once again when off NMB   FAMILY  - Updates: family updated daily - Inter-disciplinary family meet or Palliative Care meeting due by:  3/9  Care time 45 minutes  Erick Colace ACNP-BC [place] Pager # [phone] OR # [phone] if no answer  09/05/2017

## 2017-09-05 NOTE — Procedures (Signed)
Pulmonary Artery Catheter Insertion Procedure Note Holly Figueroa [id number] 13-Apr-1966  Procedure: Insertion of Pulmonary Artery Catheter  Indications: Assessment of intravascular volume and Guide hemodynamic management  Procedure Details Consent: Risks of procedure as well as the alternatives and risks of each were explained to the (patient/caregiver).  Consent for procedure obtained. Time Out: Verified patient identification, verified procedure, site/side was marked, verified correct patient position, special equipment/implants available, medications/allergies/relevent history reviewed, required imaging and test results available.  Performed Real time [place] used to Id and cannulate vessel  Description of Procedure Maximum sterile technique was used including antiseptics, cap, gloves, gown, hand hygiene, mask and sheet. Skin prep: Chlorhexidine; local anesthetic administered Pulmonary Artery Catheter was placed in the right internal jugular vein; introducer inserted over guidewire, initial position assessed by monitoring pressure waveform and catheter advanced after balloon inflation.  Evaluation Pressure waveform tracings: good, Pulmonary capillary wedge tracing: good, wedge tracing obtained after inflation of balloon with 1.25 - 1.5 cc. Complications: No apparent complications Patient did tolerate procedure well. Chest X-ray ordered to verify placement.  CXR: pending.   Clementeen Graham 09/05/2017   Erick Colace ACNP-BC [place] Pager # [phone] OR # [phone] if no answer

## 2017-09-05 NOTE — Progress Notes (Addendum)
Subjective:  Unable to obtain Patient intubated, sedated, and paralyzed.   Objective:  Vital Signs in the last 24 hours: Temp:  [99 F (37.2 C)-100 F (37.8 C)] 99 F (37.2 C) (03/14 0900) Pulse Rate:  [63-139] 63 (03/14 0900) Resp:  [15-41] 30 (03/14 0900) BP: (96-136)/(44-60) 112/44 (03/14 0800) SpO2:  [60 %-100 %] 100 % (03/14 0900) Arterial Line BP: (81-163)/(45-129) 90/59 (03/14 0900) FiO2 (%):  [50 %-100 %] 70 % (03/14 0912) Weight:  [82.3 kg (181 lb 7 oz)] 82.3 kg (181 lb 7 oz) (03/14 0500)  Intake/Output from previous day: 03/13 0701 - 03/14 0700 In: 4466.1 [I.V.:2420.1; NG/GT:1746; IV Piggyback:300] Out: 2995 [Urine:2995] Intake/Output from this shift: No intake/output data recorded.  Physical Exam: Constitutional: She appears well-developed and well-nourished.  Intubated, sedated, paralyzed Eyes: Conjunctivae are normal.  Cardiovascular: Regular rhythm and intact distal pulses.  Murmur (2/6 SEM in  apex and mid diastolic murmur II/IV. No gallop) heard. Respiratory: She has no significant rales  GI: Soft. Bowel sounds are normal.  Musculoskeletal:  Unable to test  Neurological:  Intubated, sedated, paralyzed Skin: Skin is warm and dry.   Lab Results: Recent Labs    09/04/17 0413 09/05/17 0454  WBC 11.5* 11.2*  HGB 7.5* 7.1*  PLT 474* 465*   Recent Labs    09/04/17 0413 09/05/17 0454  NA 147* 148*  K 3.2* 4.3  CL 104 106  CO2 33* 33*  GLUCOSE 147* 137*  BUN 37* 34*  CREATININE 0.74 0.71    Imaging: CLINICAL DATA:  ARDS.  EXAM: PORTABLE CHEST 1 VIEW  COMPARISON:  09/04/2017 and older studies.  FINDINGS: Airspace opacity has improved on the left, specifically the left mid to lower lung zone. Left hemidiaphragm is not defined. However, airspace opacity on the right, peripherally in the mid to lower lung zone and at the base, has increased, now obscuring the right hemidiaphragm. This changing pattern of lung consolidation  suggest pulmonary edema as the etiology.  No pneumothorax.  Lines and tubes are stable and well positioned.  IMPRESSION: Airspace lung opacities show a change in pattern, now greater on the right than the left as detailed above. This supports pulmonary edema as the etiology, with the changes likely related to changes in patient positioning. No pneumothorax. Stable well-positioned support apparatus.  Cardiac Studies: Echocardiogram 08/25/2017: Study Conclusions  - Left ventricle: The cavity size was normal. Wall thickness was   increased in a pattern of mild LVH. Systolic function was   vigorous. The estimated ejection fraction was in the range of 65%   to 70%. Wall motion was normal; there were no regional wall   motion abnormalities. Doppler parameters are consistent with   abnormal left ventricular relaxation (grade 1 diastolic   dysfunction). - Aortic valve: There was moderate stenosis. There was mild   regurgitation. Valve area (VTI): 0.82 cm^2. Valve area (Vmax):   0.87 cm^2. Valve area (Vmean): 0.78 cm^2. - Mitral valve: Calcified annulus. Moderately thickened leaflets .   Valve area by continuity equation (using LVOT flow): 0.86 cm^2. - Left atrium: The atrium was severely dilated. - Pulmonary arteries: Systolic pressure was mildly increased. - Pericardium, extracardiac: A trivial pericardial effusion was   identified.  Impressions:  - Vigorous LV systolic function; mild LVH; mild diastolic   dysfunction; aortic valve not well visualized but mean gradient   of 33 mmHg suggests moderate AS; however, there may be a   contribution from systolic anterior motion of MV causing elevated  LVOT gradient (as well as vigorous LV function); mild AI; by   report, pt with prior MV repair; mean gradient of 12 mmHg suggest   severe MS but valve by pressure half time 2.5 cm2; severe LAE;   trace TR with mildly elevated pulmonary pressure. suggest TEE to   further  assess.   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  Ventilator depended respiratory failure H/O mitral valvotomy for rheumatic MS in 2012 Mild AS, LVOT obstruction Tobacco abuse H/o iodinated contrast dye allergy  Recommendations:  Critically ill patient. Difficulty to wean of ventilator raises concern about cardiac etiology. She has multiple comorbidities, especially severe anemia, that could be contributing to LVOT obstruction. Agree with RHC to establish PA and PCWP. Recommend simultaneous left and right heart cath, measurement of LV cavity and LVOT gradients, AoV gradient, and simultaneous PCWP and LVEDP measurement for assessment of mitral stenosis. Also needs TEE. Agree with phenylephrine for now, with suspicion of LVOT obstruction. IV solumedrol, pepcid and benadryl given iodinated dye contrast allergy.  Rest of the management per ICU team.  I personally discussed the recommendations with Dr. Elsworth Soho. I spoke with patient's husband Mr. Pieter Partridge over the phone and explained the recommendations.   LOS: 12 days    Holly Figueroa 09/05/2017, 9:20 AM  Holly Huebert Esther Hardy, MD [facility]. [place] Pager: [phone] Office: [phone] If no answer Cell [phone]

## 2017-09-05 NOTE — Progress Notes (Signed)
eLink Physician-Brief Progress Note Patient Name: Holly Figueroa [place] DOB: 07/03/1965 MRN: [id number]   Date of Service  09/05/2017  HPI/Events of Note  Hypercapnia despite resp rate of 35 and 6 ml/kg ARDS protocol Vt. Airway pressures 38 on PRVC.  eICU Interventions  Switch patient to Pressure Control Ventilation with PEEP 16, PC above PEEP of 14 (airway Pressure 30) and I:E of 1:2. ABG in 1 hour.     Intervention Category Major Interventions: Hypercarbia - evaluation and management;Respiratory failure - evaluation and management  Frederik Pear 09/05/2017, 8:04 PM

## 2017-09-05 NOTE — Progress Notes (Signed)
PA catheter findings notable for  Pulmonary hypertension and pulmonary edema. Also has reduced RV systolic function, as suggested by [facility] of 0.6. She needs aggressive diuresis.  Potentially, her findings could be due to severe mitral stenosis.  Recommend TEE to adequately evaluate mitral valve. Given the right heart cath findings now available and clearly indicating pulmonary edema, we will hold left heart catheterization, as it will not immediately change management.  Nigel Mormon, MD [facility]. [place] Pager: [phone] Office: [phone] If no answer Cell [phone]

## 2017-09-05 NOTE — Progress Notes (Signed)
[facility] Progress Note Patient Name: Holly Figueroa DOB: 1965-09-19 MRN: [id number]   Date of Service  09/05/2017  HPI/Events of Note  Significantly improved ABG  eICU Interventions  Wean FIo2 and PEEP slowly as tolerated. PEEP reduced to 14 and Fio2 reduced to 80 %        Okoronkwo U Ogan 09/05/2017, 4:47 AM

## 2017-09-05 NOTE — Progress Notes (Signed)
Transferred from [facility] to [facility].  PA pressures high.  SBP high.  Hypercapnic acidosis on ABG.  Hb low.  D/c phenylephrine.  Prn labetalol.  Transfuse 1 unit PRBC.  Increased RR.  Going to cath lab later this afternoon.   Chesley Mires, MD [facility] Pulmonary/Critical Care 09/05/2017, 2:30 PM Pager:  [phone] After 3pm call: [phone]

## 2017-09-05 NOTE — Progress Notes (Signed)
Patwardhan, MD verbal order for 2.5 mg IV metoprolol Q6H. Will administer and monitor closely.  Lucius Conn, RN

## 2017-09-06 ENCOUNTER — Inpatient Hospital Stay (HOSPITAL_COMMUNITY): Payer: Medicare PPO

## 2017-09-06 DIAGNOSIS — J9602 Acute respiratory failure with hypercapnia: Secondary | ICD-10-CM

## 2017-09-06 LAB — BPAM RBC
BLOOD PRODUCT EXPIRATION DATE: 201904082359
ISSUE DATE / TIME: 201903141644
UNIT TYPE AND RH: 5100

## 2017-09-06 LAB — BRAIN NATRIURETIC PEPTIDE: B Natriuretic Peptide: 179.5 pg/mL — ABNORMAL HIGH (ref 0.0–100.0)

## 2017-09-06 LAB — GLUCOSE, CAPILLARY
GLUCOSE-CAPILLARY: 172 mg/dL — AB (ref 65–99)
GLUCOSE-CAPILLARY: 163 mg/dL — AB (ref 65–99)
GLUCOSE-CAPILLARY: 121 mg/dL — AB (ref 65–99)
Glucose-Capillary: 110 mg/dL — ABNORMAL HIGH (ref 65–99)
Glucose-Capillary: 142 mg/dL — ABNORMAL HIGH (ref 65–99)
Glucose-Capillary: 154 mg/dL — ABNORMAL HIGH (ref 65–99)

## 2017-09-06 LAB — BASIC METABOLIC PANEL
Anion gap: 9 (ref 5–15)
BUN: 32 mg/dL — ABNORMAL HIGH (ref 6–20)
CHLORIDE: 105 mmol/L (ref 101–111)
CO2: 31 mmol/L (ref 22–32)
Calcium: 8.6 mg/dL — ABNORMAL LOW (ref 8.9–10.3)
Creatinine, Ser: 0.77 mg/dL (ref 0.44–1.00)
GFR calc Af Amer: >60 mL/min (ref >60)
GFR calc non Af Amer: >60 mL/min (ref >60)
Glucose, Bld: 190 mg/dL — ABNORMAL HIGH (ref 65–99)
POTASSIUM: 3.9 mmol/L (ref 3.5–5.1)
SODIUM: 145 mmol/L (ref 135–145)

## 2017-09-06 LAB — CBC
HEMATOCRIT: 28.2 % — AB (ref 36.0–46.0)
HEMOGLOBIN: 8.3 g/dL — AB (ref 12.0–15.0)
MCH: 27.7 pg (ref 26.0–34.0)
MCHC: 29.4 g/dL — AB (ref 30.0–36.0)
MCV: 94 fL (ref 78.0–100.0)
Platelets: 439 10*3/uL — ABNORMAL HIGH (ref 150–400)
RBC: 3 MIL/uL — ABNORMAL LOW (ref 3.87–5.11)
RDW: 17.5 % — ABNORMAL HIGH (ref 11.5–15.5)
WBC: 11.4 10*3/uL — ABNORMAL HIGH (ref 4.0–10.5)

## 2017-09-06 LAB — TYPE AND SCREEN
ABO/RH(D): O POS
ANTIBODY SCREEN: NEGATIVE
UNIT DIVISION: 0

## 2017-09-06 LAB — PROCALCITONIN: Procalcitonin: 3.36 ng/mL

## 2017-09-06 LAB — MAGNESIUM: MAGNESIUM: 2.2 mg/dL (ref 1.7–2.4)

## 2017-09-06 LAB — TRIGLYCERIDES: Triglycerides: 328 mg/dL — ABNORMAL HIGH (ref <150)

## 2017-09-06 MED ORDER — FLUOXETINE HCL 20 MG PO CAPS
20.0000 mg | ORAL_CAPSULE | Freq: Every day | ORAL | Status: DC
  Administered 2017-09-06 – 2017-09-18 (×10): 20 mg
  Filled 2017-09-06 (×11): qty 1

## 2017-09-06 MED ORDER — ORAL CARE MOUTH RINSE
15.0000 mL | OROMUCOSAL | Status: DC
  Administered 2017-09-06 – 2017-09-11 (×55): 15 mL via OROMUCOSAL

## 2017-09-06 MED ORDER — MIDAZOLAM HCL 2 MG/2ML IJ SOLN
2.0000 mg | INTRAMUSCULAR | Status: DC | PRN
  Administered 2017-09-06 (×4): 2 mg via INTRAVENOUS
  Filled 2017-09-06 (×5): qty 2

## 2017-09-06 MED ORDER — METOPROLOL TARTRATE 25 MG/10 ML ORAL SUSPENSION
50.0000 mg | Freq: Two times a day (BID) | ORAL | Status: DC
  Administered 2017-09-06 – 2017-09-18 (×18): 50 mg
  Filled 2017-09-06 (×23): qty 20

## 2017-09-06 MED ORDER — FUROSEMIDE 10 MG/ML IJ SOLN
40.0000 mg | Freq: Two times a day (BID) | INTRAMUSCULAR | Status: DC
  Administered 2017-09-06 – 2017-09-09 (×5): 40 mg via INTRAVENOUS
  Filled 2017-09-06 (×6): qty 4

## 2017-09-06 MED ORDER — ROSUVASTATIN CALCIUM 10 MG PO TABS: 10.0000 mg | ORAL_TABLET | Freq: Every day | ORAL | Status: DC

## 2017-09-06 MED ORDER — TRAZODONE HCL 50 MG PO TABS: 100.0000 mg | ORAL_TABLET | Freq: Every day | ORAL | Status: DC

## 2017-09-06 MED ORDER — POTASSIUM CHLORIDE 20 MEQ/15ML (10%) PO SOLN
40.0000 meq | Freq: Four times a day (QID) | ORAL | Status: AC
  Administered 2017-09-06 (×2): 40 meq via ORAL
  Filled 2017-09-06 (×2): qty 30

## 2017-09-06 MED ORDER — FENTANYL 2500MCG IN NS 250ML (10MCG/ML) PREMIX INFUSION
25.0000 ug/h | INTRAVENOUS | Status: DC
  Administered 2017-09-06: 100 ug/h via INTRAVENOUS
  Administered 2017-09-06 – 2017-09-08 (×8): 400 ug/h via INTRAVENOUS
  Administered 2017-09-09: 375 ug/h via INTRAVENOUS
  Administered 2017-09-09 (×2): 400 ug/h via INTRAVENOUS
  Administered 2017-09-10: 375 ug/h via INTRAVENOUS
  Administered 2017-09-10: 350 ug/h via INTRAVENOUS
  Administered 2017-09-10: 250 ug/h via INTRAVENOUS
  Administered 2017-09-11: 300 ug/h via INTRAVENOUS
  Filled 2017-09-06 (×18): qty 250

## 2017-09-06 MED ORDER — ROSUVASTATIN CALCIUM 10 MG PO TABS
10.0000 mg | ORAL_TABLET | Freq: Every day | ORAL | Status: DC
  Administered 2017-09-06 – 2017-09-17 (×9): 10 mg
  Filled 2017-09-06 (×10): qty 1

## 2017-09-06 MED ORDER — FLUOXETINE HCL 20 MG PO CAPS: 80.0000 mg | ORAL_CAPSULE | Freq: Every day | ORAL | Status: DC

## 2017-09-06 MED ORDER — PANTOPRAZOLE SODIUM 40 MG PO PACK
40.0000 mg | PACK | Freq: Two times a day (BID) | ORAL | Status: DC
  Administered 2017-09-06 – 2017-09-18 (×20): 40 mg
  Filled 2017-09-06 (×25): qty 20

## 2017-09-06 MED ORDER — FENTANYL 2500MCG IN NS 250ML (10MCG/ML) PREMIX INFUSION
10.0000 ug/h | INTRAVENOUS | Status: DC
  Administered 2017-09-06: 300 ug/h via INTRAVENOUS

## 2017-09-06 MED ORDER — FENTANYL BOLUS VIA INFUSION
50.0000 ug | INTRAVENOUS | Status: DC | PRN
  Administered 2017-09-07: 50 ug via INTRAVENOUS
  Administered 2017-09-07: 100 ug via INTRAVENOUS
  Filled 2017-09-06: qty 50

## 2017-09-06 MED ORDER — SODIUM CHLORIDE 0.9 % IV SOLN
INTRAVENOUS | Status: DC
  Administered 2017-09-11: 17:00:00 via INTRAVENOUS

## 2017-09-06 MED ORDER — PROMETHAZINE HCL 25 MG/ML IJ SOLN
12.5000 mg | Freq: Four times a day (QID) | INTRAMUSCULAR | Status: DC | PRN
  Administered 2017-09-10: 25 mg via INTRAVENOUS
  Administered 2017-09-12: 12.5 mg via INTRAVENOUS
  Filled 2017-09-06 (×2): qty 1

## 2017-09-06 MED ORDER — CHLORHEXIDINE GLUCONATE 0.12% ORAL RINSE (MEDLINE KIT)
15.0000 mL | Freq: Two times a day (BID) | OROMUCOSAL | Status: DC
  Administered 2017-09-06 – 2017-09-10 (×10): 15 mL via OROMUCOSAL

## 2017-09-06 MED ORDER — TRAZODONE HCL 100 MG PO TABS
100.0000 mg | ORAL_TABLET | Freq: Every day | ORAL | Status: DC
  Administered 2017-09-06 – 2017-09-17 (×10): 100 mg
  Filled 2017-09-06 (×3): qty 2
  Filled 2017-09-06: qty 1
  Filled 2017-09-06: qty 2
  Filled 2017-09-06: qty 1
  Filled 2017-09-06: qty 2
  Filled 2017-09-06 (×3): qty 1

## 2017-09-06 MED ORDER — FUROSEMIDE 10 MG/ML IJ SOLN
60.0000 mg | Freq: Four times a day (QID) | INTRAMUSCULAR | Status: DC
  Administered 2017-09-06: 60 mg via INTRAVENOUS
  Filled 2017-09-06: qty 6

## 2017-09-06 MED ORDER — FENTANYL 2500MCG IN NS 250ML (10MCG/ML) PREMIX INFUSION
100.0000 ug/h | INTRAVENOUS | Status: DC
  Filled 2017-09-06: qty 250

## 2017-09-06 MED ORDER — PROPOFOL 1000 MG/100ML IV EMUL
0.0000 ug/kg/min | INTRAVENOUS | Status: DC
  Administered 2017-09-06 – 2017-09-07 (×4): 50 ug/kg/min via INTRAVENOUS
  Filled 2017-09-06 (×5): qty 100

## 2017-09-06 NOTE — Consult Note (Signed)
[place] Nurse wound consult note Reason for Consult:Deep tissue injury to coccyx.  Complicated with moisture.  Loose stools.  Has fecal management system and foley catheter in place at this time  Right trochanter with nonblanchable erythema, repositioned and this is improving by end of assessment.  Prevalon boots to bilateral heels to offfload pressure Is on mattress with low air loss feature Wound type:Pressure and moisture Pressure Injury POA: No Measurement: 3 cm x 2.6 cm maroon discoloration Wound bed: intact maroon discoloration Drainage (amount, consistency, odor) none Periwound:intact Dressing procedure/placement/frequency:Cleanse coccyx with soap and water and pat dry. Barrier cream twice daily.  Turn and reposition every two hours.  Prevalon boots to heels Will not follow at this time.  Please re-consult if needed.  Domenic Moras RN [employer] Pager [phone]

## 2017-09-06 NOTE — Progress Notes (Signed)
Spoke with [employer]. Pt does not need to transfer to [facility] at this time per Halford Chessman, MD. [facility] is taking pt off the waiting list at this time.  Lucius Conn, RN

## 2017-09-06 NOTE — Progress Notes (Signed)
Subjective:  Unable to obtain Patient intubated, sedated, and paralyzed.   Objective:  Vital Signs in the last 24 hours: Temp:  [98.4 F (36.9 C)-100 F (37.8 C)] 99.7 F (37.6 C) (03/15 1612) Pulse Rate:  [62-110] 80 (03/15 1612) Resp:  [19-36] 35 (03/15 1612) BP: (91-158)/(45-87) 136/71 (03/15 1600) SpO2:  [95 %-100 %] 100 % (03/15 1612) Arterial Line BP: (64-170)/(45-82) 134/73 (03/15 1612) FiO2 (%):  [50 %-80 %] 50 % (03/15 1557) Weight:  [84.4 kg (186 lb 1.1 oz)] 84.4 kg (186 lb 1.1 oz) (03/15 0500)  Intake/Output from previous day: 03/14 0701 - 03/15 0700 In: 3520.9 [I.V.:2657.2; Blood:315; NG/GT:248.7; IV Piggyback:300] Out: 0300 [Urine:4225; Stool:250] Intake/Output from this shift: Total I/O In: 1182.7 [I.V.:752.7; NG/GT:330; IV Piggyback:100] Out: 9233 [AQTMA:2633; Stool:100]  Physical Exam: Constitutional: She appears well-developed and well-nourished.  Intubated, sedated, paralyzed Eyes: Conjunctivae are normal.  Cardiovascular: Regular rhythm and intact distal pulses.  Murmur (2/6 SEM in  apex and mid diastolic murmur II/IV. No gallop) heard. Respiratory: She has no significant rales  GI: Soft. Bowel sounds are normal.  Musculoskeletal:  Unable to test  Neurological:  Intubated, sedated, paralyzed Skin: Skin is warm and dry.   Lab Results: Recent Labs    09/05/17 0454 09/05/17 2131 09/06/17 0307  WBC 11.2*  --  11.4*  HGB 7.1* 8.8* 8.3*  PLT 465*  --  439*   Recent Labs    09/05/17 0454 09/06/17 0307  NA 148* 145  K 4.3 3.9  CL 106 105  CO2 33* 31  GLUCOSE 137* 190*  BUN 34* 32*  CREATININE 0.71 0.77    Imaging: CLINICAL DATA:  ARDS.  EXAM: PORTABLE CHEST 1 VIEW  COMPARISON:  09/04/2017 and older studies.  FINDINGS: Airspace opacity has improved on the left, specifically the left mid to lower lung zone. Left hemidiaphragm is not defined. However, airspace opacity on the right, peripherally in the mid to lower lung zone and at  the base, has increased, now obscuring the right hemidiaphragm. This changing pattern of lung consolidation suggest pulmonary edema as the etiology.  No pneumothorax.  Lines and tubes are stable and well positioned.  IMPRESSION: Airspace lung opacities show a change in pattern, now greater on the right than the left as detailed above. This supports pulmonary edema as the etiology, with the changes likely related to changes in patient positioning. No pneumothorax. Stable well-positioned support apparatus.  Cardiac Studies: Echocardiogram 08/25/2017: Study Conclusions  - Left ventricle: The cavity size was normal. Wall thickness was   increased in a pattern of mild LVH. Systolic function was   vigorous. The estimated ejection fraction was in the range of 65%   to 70%. Wall motion was normal; there were no regional wall   motion abnormalities. Doppler parameters are consistent with   abnormal left ventricular relaxation (grade 1 diastolic   dysfunction). - Aortic valve: There was moderate stenosis. There was mild   regurgitation. Valve area (VTI): 0.82 cm^2. Valve area (Vmax):   0.87 cm^2. Valve area (Vmean): 0.78 cm^2. - Mitral valve: Calcified annulus. Moderately thickened leaflets .   Valve area by continuity equation (using LVOT flow): 0.86 cm^2. - Left atrium: The atrium was severely dilated. - Pulmonary arteries: Systolic pressure was mildly increased. - Pericardium, extracardiac: A trivial pericardial effusion was   identified.  Impressions:  - Vigorous LV systolic function; mild LVH; mild diastolic   dysfunction; aortic valve not well visualized but mean gradient   of 33 mmHg suggests moderate  AS; however, there may be a   contribution from systolic anterior motion of MV causing elevated   LVOT gradient (as well as vigorous LV function); mild AI; by   report, pt with prior MV repair; mean gradient of 12 mmHg suggest   severe MS but valve by pressure half time  2.5 cm2; severe LAE;   trace TR with mildly elevated pulmonary pressure. suggest TEE to   further assess.  Formal TEE report pending. Per my read, Normal LVEF and RV function Rheumatic mitral and aortic valve disease MV mean PG 13 mmhg at 79 bpm. 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 Mod MS, mild MR, s/p mitral valvotomy 2012 Mod AS, mod AI No significant LVOT obstruction H/o tobacco abuse  Recommendations: Recommend scheduled IV metoprolol 2.5 mg q6hr IV lasix 40 mg iv 12 hr While she has mixed rheumatic valvular dissase, management would remain medical and supportive at this time. She will need close follow up for surveillance of valvular heart disease     LOS: 13 days    Manish J Patwardhan 09/06/2017, 5:29 PM  Manish Esther Hardy, MD [facility] Cardiovascular. [place] Pager: [phone] Office: [phone] If no answer Cell [phone]

## 2017-09-06 NOTE — Progress Notes (Signed)
PULMONARY / CRITICAL CARE MEDICINE   Name: Holly Figueroa MRN: [id number] DOB: 1965/12/09    ADMISSION DATE:  08/24/2017 CONSULTATION DATE:  08/24/2017  REFERRING MD:  Dr. Tamala Julian / [facility]   CHIEF COMPLAINT:  SOB  BRIEF SUMMARY:  52 yo female smoker admitted at [facility] 2/25 with PNA, hematemesis with esophagitis on EGD from 06/12/17, UTI.  She presented 3/02 with progressive dyspnea and hemoptysis.  Found to have influenza B with PNA and ARDS.  Intubated.  Had refractory hypoxia from pulmonary edema in setting of hyperdynamic state, valvular heart disease, and diastolic CHF.  PMHx of mitral stenosis, diastolic CHF, DM, Depression.  SUBJECTIVE:   Had TEE this AM.  VITAL SIGNS: BP 126/60   Pulse 74   Temp 99 F (37.2 C)   Resp (!) 35   Ht _0  (1.676 m)   Wt 186 lb 1.1 oz (84.4 kg)   SpO2 100%   BMI 30.03 kg/m   HEMODYNAMICS: PAP: (26-90)/(19-68) 53/25 CVP:  [5 mmHg-22 mmHg] 12 mmHg CO:  [6.4 L/min-8.4 L/min] 6.4 L/min CI:  [3.2 L/min/m2-4.2 L/min/m2] 3.2 L/min/m2  VENTILATOR SETTINGS: Vent Mode: PCV FiO2 (%):  [70 %-80 %] 70 % Set Rate:  [30 bmp-35 bmp] 35 bmp Vt Set:  [360 mL] 360 mL PEEP:  [14 cmH20-16 cmH20] 16 cmH20 Plateau Pressure:  [27 cmH20-32 cmH20] 28 cmH20  INTAKE / OUTPUT: I/O last 3 completed shifts: In: 5579.9 [I.V.:3666.2; Blood:315; NG/GT:998.7; IV TFTDDUKGU:542] Out: 7062 [BJSEG:3151; Stool:250]  PHYSICAL EXAMINATION:  General - chemically paralyzed Eyes - pupils reactive ENT - ETT in place Cardiac - regular, 2/6 systolic murmur Chest - faint b/l crackles Abd - soft, non tender Ext - 1+ edema Skin - no rashes Neuro - RASS -4  LABS:  BMET Recent Labs  Lab 09/04/17 0413 09/05/17 0454 09/06/17 0307  NA 147* 148* 145  K 3.2* 4.3 3.9  CL 104 106 105  CO2 33* 33* 31  BUN 37* 34* 32*  CREATININE 0.74 0.71 0.77  GLUCOSE 147* 137* 190*    Electrolytes Recent Labs  Lab 09/03/17 0700 09/04/17 0413 09/05/17 0454 09/06/17 0307  CALCIUM  8.8* 8.7* 8.5* 8.6*  MG 2.3 2.3 2.1 2.2  PHOS 3.0 3.2 2.8  --     CBC Recent Labs  Lab 09/04/17 0413 09/05/17 0454 09/05/17 2131 09/06/17 0307  WBC 11.5* 11.2*  --  11.4*  HGB 7.5* 7.1* 8.8* 8.3*  HCT 26.3* 25.3* 29.8* 28.2*  PLT 474* 465*  --  439*    Coag's Recent Labs  Lab 09/05/17 1513  INR 1.19    Sepsis Markers Recent Labs  Lab 09/05/17 0452 09/06/17 0307  PROCALCITON 3.58 3.36    ABG Recent Labs  Lab 09/05/17 1407 09/05/17 1930 09/05/17 2134  PHART 7.290* 7.224* 7.319*  PCO2ART 76.5* 82.8* 72.8*  PO2ART 82.0* 135* 225.0*    Liver Enzymes No results for input(s): AST, ALT, ALKPHOS, BILITOT, ALBUMIN in the last 168 hours.  Cardiac Enzymes No results for input(s): TROPONINI, PROBNP in the last 168 hours.  Glucose Recent Labs  Lab 09/05/17 1413 09/05/17 1611 09/05/17 2032 09/05/17 2333 09/06/17 0343 09/06/17 0854  GLUCAP 175* 207* 214* 222* 172* 110*    Imaging Dg Chest Port 1 View  Result Date: 09/06/2017 CLINICAL DATA:  Hypoxia EXAM: PORTABLE CHEST 1 VIEW COMPARISON:  September 05, 2017 FINDINGS: Endotracheal tube tip is 2.4 cm above the carina. Swan-Ganz catheter tip is in the right main pulmonary artery. Nasogastric tube tip and side  port are below the diaphragm. No pneumothorax. There is interstitial pulmonary edema bilaterally, most notably in the mid and lower lung zones. There is airspace consolidation in each medial lung base. There is a small right pleural effusion. Heart is borderline enlarged with mild pulmonary venous hypertension. No adenopathy. No bone lesions. IMPRESSION: Tube and catheter positions as described without pneumothorax. Underlying pulmonary edema with small right pleural effusion. Airspace opacity in the medial lung bases probably represents pneumonia, although alveolar edema may present in this manner. Both entities may exist concurrently. There is underlying pulmonary vascular congestion. Electronically Signed   By:  Lowella Grip III M.D.   On: 09/06/2017 08:58   Dg Chest Port 1 View  Result Date: 09/05/2017 CLINICAL DATA:  Encounter for central line placement. Acute respiratory failure. EXAM: PORTABLE CHEST 1 VIEW COMPARISON:  Portable film earlier in the day. FINDINGS: Cardiomegaly. Diffuse pulmonary edema, slight improvement. LEFT IJ catheter unchanged. New RIGHT Swan-Ganz catheter via RIGHT IJ approach. Tip lies in the RIGHT main pulmonary artery. Unchanged orogastric tube. No pneumothorax. IMPRESSION: Cardiomegaly with diffuse pulmonary edema, may be slightly improved. RIGHT IJ Swan-Ganz catheter tip RIGHT main pulmonary artery. No pneumothorax. Electronically Signed   By: Staci Righter M.D.   On: 09/05/2017 11:51   STUDIES:  TTE 3/3 >> EF 65 to 70%, grade 1 DD, mod AS,  LE Venous Duplex 3/3 >> negative  TEE 3/15 >> mod LVH, mild/mod MS, mod AS  CULTURES: BCx2 3/2 >> negative  Strep Pneumo Antigen 3/3 >> negative  RVP 3/3 >> influenza B positive  Tracheal Aspirate 3/5 >> negative  BCx2 3/7 >> negative BAL 3/8 >>negative Sputum 3/14 >>  ANTIBIOTICS: Zosyn 3/2 >> 3/8 Vanco 3/2 >> 3/4  Meropenem 3/8  >>  Vanco 3/8  >> 3/11  SIGNIFICANT EVENTS: 3/02  Admit with SOB, vomited on BiPAP > intubated, ARDS  3/03  Placed on paralytics, requiring pressors 3/05  PRBC x1.  Paralytics stopped 3/07  Off pressors. 3/08  Bronch >> unexpectedly showed focal bleeding from right upper lobe, anterior segment 3/09 Reparalysed, proned 3/10 placed supine 3/11 NMB stopped.  3/13 worsening hypoxia, nimbex resumed 3/14 placing PA catheter and then dissipating transition to [facility] 3/15 TEE, nimbex d/c'ed  LINES/TUBES: ETT 3/2 >>  L IJ TLC 3/3 >> Rt radial A line 3/9>> Rt IJ PA catheter 3/14 >>  DISCUSSION:   Most recent issue with hypoxia likely related to acute pulmonary edema from valvular heart disease, diastolic dysfunction and hyperdynamic state in setting of tachycardia, pressors, and hypervolemia.   Has improvement after diuresis.  Will d/c nimbex.  ASSESSMENT / PLAN:  Acute respiratory failure with hypoxia/hypercapnia initially from Influenza B with PNA and ARDS, but more recently from acute pulmonary edema. - PEEP/FiO2 to keep SpO2 > 92% - continue lasix - f/u CXR  Recurrent fever 3/11. 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 Mod AS, mild/mod MS by TEE 3/15. - keep PA catheter in - continue diuresis - control heart rate - continue lopressor  Upper GI bleed from esophagitis February 2019. - continue protonix - was to have f/u EGD by Dr. Barney Drain in 3 weeks >> will need to have GI assess when more stable for procedure  Anemia of critical illness and GI bleed. - transfuse for Hb < 7 - check iron levels  DM type II. - SSI with lantus  Acute metabolic encephalopathy. Hx of anxiety, depression, panic attacks on chronic benzodiazepines. -  RASS goal -2 after nimbex d/ced - monitor triglyceride while on diprivan  DVT prophylaxis - SCDs SUP - protonix Nutrition - tube feeds Goals of care - full code  Updated pt's husband at bedside  D/w Dr. Einar Gip  CC time 52 minutes  Chesley Mires, MD [place] 09/06/2017, 10:46 AM Pager:  [phone] After 3pm call: [phone]

## 2017-09-06 NOTE — CV Procedure (Signed)
TEE: Under deep sedation, TEE was performed without complications: LV: Normal. Normal EF. Mod LVH RV: Normal LA: Normal. Left atrial appendage: Normal without thrombus. Inter atrial septum is intact without defect.  RA: Normal  MV: Rheumatic MV with moderate MS. Mean PG suggest suggest severe MS, but planimetry and continuity suggest mild MS. Mild MR.  TV: Normal, no  TR TV:IFXGXIVHS. Mild to moderate AI and mild to moderate AS, mean PG 37 mm Hg. Marland Kitchen PV: Normal. Trace PI.  Thoracic and ascending aorta: Minimal atheresclerotic changes.    Adrian Prows, MD 09/06/2017, 9:36 AM [facility]. [place] Pager: [phone] Office: [phone] If no answer: Cell:  [phone]

## 2017-09-06 NOTE — Plan of Care (Signed)
Progressing Clinical Measurements: Ability to maintain clinical measurements within normal limits will improve 09/06/2017 0455 - Progressing by Arletta Bale, RN Will remain free from infection 09/06/2017 0455 - Progressing by Arletta Bale, RN Diagnostic test results will improve 09/06/2017 0455 - Progressing by Arletta Bale, RN Respiratory complications will improve 09/06/2017 0455 - Progressing by Arletta Bale, RN Cardiovascular complication will be avoided 09/06/2017 0455 - Progressing by Arletta Bale, RN Nutrition: Adequate nutrition will be maintained 09/06/2017 0455 - Progressing by Arletta Bale, RN Coping: Level of anxiety will decrease 09/06/2017 0455 - Progressing by Arletta Bale, RN Elimination: Will not experience complications related to bowel motility 09/06/2017 0455 - Progressing by Arletta Bale, RN Will not experience complications related to urinary retention 09/06/2017 0455 - Progressing by Arletta Bale, RN Safety: Ability to remain free from injury will improve 09/06/2017 0455 - Progressing by Arletta Bale, RN Skin Integrity: Risk for impaired skin integrity will decrease 09/06/2017 0455 - Progressing by Arletta Bale, RN Coping: Ability to adjust to condition or change in health will improve 09/06/2017 0455 - Progressing by Arletta Bale, RN Fluid Volume: Ability to maintain a balanced intake and output will improve 09/06/2017 0455 - Progressing by Arletta Bale, RN Metabolic: Ability to maintain appropriate glucose levels will improve 09/06/2017 0455 - Progressing by Arletta Bale, RN Nutritional: Maintenance of adequate nutrition will improve 09/06/2017 0455 - Progressing by Arletta Bale, RN Progress toward achieving an optimal weight will improve 09/06/2017 0455 - Progressing by Arletta Bale, RN Tissue Perfusion: Adequacy of tissue perfusion will improve 09/06/2017 0455 - Progressing by Arletta Bale,  RN Clinical Measurements: Ability to maintain a body temperature in the normal range will improve 09/06/2017 0455 - Progressing by Arletta Bale, RN Respiratory: Ability to maintain adequate ventilation will improve 09/06/2017 0455 - Progressing by Arletta Bale, RN Ability to maintain a clear airway will improve 09/06/2017 0455 - Progressing by Arletta Bale, RN   Not Progressing Skin Integrity: Risk for impaired skin integrity will decrease 09/06/2017 0455 - Not Progressing by Arletta Bale, RN   Not Applicable Health Behavior/Discharge Planning: Ability to manage health-related needs will improve 1/44/3154 0086 - Not Applicable by Arletta Bale, RN Activity: Risk for activity intolerance will decrease 7/61/9509 3267 - Not Applicable by Arletta Bale, RN Education: Ability to describe self-care measures that may prevent or decrease complications (Diabetes Survival Skills Education) will improve 07/19/5807 9833 - Not Applicable by Arletta Bale, RN Health Behavior/Discharge Planning: Ability to identify and utilize available resources and services will improve 02/16/538 7673 - Not Applicable by Arletta Bale, RN Ability to manage health-related needs will improve 10/11/3788 2409 - Not Applicable by Arletta Bale, RN Activity: Ability to tolerate increased activity will improve 7/35/3299 2426 - Not Applicable by Arletta Bale, RN Education: Understanding of CV disease, CV risk reduction, and recovery process will improve 8/34/1962 2297 - Not Applicable by Arletta Bale, RN Activity: Ability to return to baseline activity level will improve 9/89/2119 4174 - Not Applicable by Arletta Bale, RN Cardiovascular: Ability to achieve and maintain adequate cardiovascular perfusion will improve 0/81/4481 8563 - Not Applicable by Arletta Bale, RN Vascular access site(s) Level 0-1 will be maintained 1/49/7026 3785 - Not Applicable by Arletta Bale,  RN Health Behavior/Discharge Planning: Ability to safely manage health-related needs after discharge will improve 8/85/0277 4128 - Not Applicable by Elly Modena,  Marcell Barlow, RN

## 2017-09-06 NOTE — Progress Notes (Signed)
Paged on call provider, Charisse Klinefelter, NP  Pt reports feeling nausea but no order in Epic  Pt is allergic to Ondansetron... Will wait for further instructions.

## 2017-09-06 NOTE — Care Management Note (Signed)
Case Management Note Marvetta Gibbons RN, BSN Unit 4E-Case Manager-- [facility] coverage [phone]  Patient Details  Name: Holly Figueroa MRN: [id number] Date of Birth: 1966-02-07  Subjective/Objective:  Pt admitted with Acute resp. Failure- found to have flu B and PNA with ARDS. 3/2 intubated                 Action/Plan: PTA pt lived at home with spouse- 3/15 pt remains intubated- CM to follow for transition of care needs.   Expected Discharge Date:                  Expected Discharge Plan:  Home/Self Care  In-House Referral:     Discharge planning Services  CM Consult  Post Acute Care Choice:    Choice offered to:     DME Arranged:    DME Agency:     HH Arranged:    HH Agency:     Status of Service:  In process, will continue to follow  If discussed at Long Length of Stay Meetings, dates discussed:    Discharge Disposition:   Additional Comments:  Dawayne Patricia, RN 09/06/2017, 4:33 PM

## 2017-09-06 NOTE — Progress Notes (Signed)
Patient scheduled for TEE to evaluate the Mitral stenosis and sub aortic stenosis. All procedural details have been discussed with husband and they would like me to proceed.  Patient is intubated and sedated.   Adrian Prows, MD 09/06/2017, 9:10 AM [facility]. [place] Pager: [phone] Office: [phone] If no answer: Cell:  [phone]

## 2017-09-07 ENCOUNTER — Inpatient Hospital Stay (HOSPITAL_COMMUNITY): Payer: Medicare PPO

## 2017-09-07 DIAGNOSIS — R042 Hemoptysis: Secondary | ICD-10-CM

## 2017-09-07 LAB — BASIC METABOLIC PANEL
ANION GAP: 8 (ref 5–15)
Anion gap: 9 (ref 5–15)
BUN: 31 mg/dL — ABNORMAL HIGH (ref 6–20)
BUN: 28 mg/dL — AB (ref 6–20)
CALCIUM: 8.6 mg/dL — AB (ref 8.9–10.3)
CO2: 32 mmol/L (ref 22–32)
CO2: 30 mmol/L (ref 22–32)
CREATININE: 0.61 mg/dL (ref 0.44–1.00)
Calcium: 8.6 mg/dL — ABNORMAL LOW (ref 8.9–10.3)
Chloride: 108 mmol/L (ref 101–111)
Chloride: 108 mmol/L (ref 101–111)
Creatinine, Ser: 0.67 mg/dL (ref 0.44–1.00)
GFR calc Af Amer: >60 mL/min (ref >60)
GFR calc non Af Amer: >60 mL/min (ref >60)
GFR calc non Af Amer: >60 mL/min (ref >60)
GLUCOSE: 129 mg/dL — AB (ref 65–99)
Glucose, Bld: 206 mg/dL — ABNORMAL HIGH (ref 65–99)
Potassium: 2.9 mmol/L — ABNORMAL LOW (ref 3.5–5.1)
Potassium: 3.6 mmol/L (ref 3.5–5.1)
Sodium: 148 mmol/L — ABNORMAL HIGH (ref 135–145)
Sodium: 147 mmol/L — ABNORMAL HIGH (ref 135–145)

## 2017-09-07 LAB — BLOOD GAS, ARTERIAL
Acid-Base Excess: 2.1 mmol/L — ABNORMAL HIGH (ref 0.0–2.0)
Bicarbonate: 25.6 mmol/L (ref 20.0–28.0)
FIO2: 50
O2 Saturation: 99.8 %
PEEP/CPAP: 12 cmH2O
Patient temperature: 98.6
Pressure control: 14 cmH2O
RATE: 35 resp/min
pCO2 arterial: 36.4 mmHg (ref 32.0–48.0)
pH, Arterial: 7.461 — ABNORMAL HIGH (ref 7.350–7.450)
pO2, Arterial: 187 mmHg — ABNORMAL HIGH (ref 83.0–108.0)

## 2017-09-07 LAB — CBC
HCT: 29.9 % — ABNORMAL LOW (ref 36.0–46.0)
HEMOGLOBIN: 8.4 g/dL — AB (ref 12.0–15.0)
MCH: 26.5 pg (ref 26.0–34.0)
MCHC: 28.1 g/dL — ABNORMAL LOW (ref 30.0–36.0)
MCV: 94.3 fL (ref 78.0–100.0)
Platelets: 466 10*3/uL — ABNORMAL HIGH (ref 150–400)
RBC: 3.17 MIL/uL — AB (ref 3.87–5.11)
RDW: 17.2 % — ABNORMAL HIGH (ref 11.5–15.5)
WBC: 10.8 10*3/uL — ABNORMAL HIGH (ref 4.0–10.5)

## 2017-09-07 LAB — GLUCOSE, CAPILLARY
GLUCOSE-CAPILLARY: 125 mg/dL — AB (ref 65–99)
GLUCOSE-CAPILLARY: 118 mg/dL — AB (ref 65–99)
GLUCOSE-CAPILLARY: 191 mg/dL — AB (ref 65–99)
Glucose-Capillary: 67 mg/dL (ref 65–99)
Glucose-Capillary: 284 mg/dL — ABNORMAL HIGH (ref 65–99)
Glucose-Capillary: 269 mg/dL — ABNORMAL HIGH (ref 65–99)

## 2017-09-07 LAB — IRON AND TIBC
Iron: 34 ug/dL (ref 28–170)
Saturation Ratios: 12 % (ref 10.4–31.8)
TIBC: 277 ug/dL (ref 250–450)
UIBC: 243 ug/dL

## 2017-09-07 LAB — PHOSPHORUS: PHOSPHORUS: 2.1 mg/dL — AB (ref 2.5–4.6)

## 2017-09-07 LAB — MAGNESIUM: Magnesium: 1.9 mg/dL (ref 1.7–2.4)

## 2017-09-07 LAB — BRAIN NATRIURETIC PEPTIDE: B Natriuretic Peptide: 207.8 pg/mL — ABNORMAL HIGH (ref 0.0–100.0)

## 2017-09-07 LAB — FERRITIN: FERRITIN: 358 ng/mL — AB (ref 11–307)

## 2017-09-07 LAB — PROCALCITONIN: Procalcitonin: 2.16 ng/mL

## 2017-09-07 MED ORDER — VITAL AF 1.2 CAL PO LIQD
ORAL | Status: DC
  Administered 2017-09-07 – 2017-09-09 (×3)
  Filled 2017-09-07 (×5): qty 1000

## 2017-09-07 MED ORDER — OCUVITE-LUTEIN PO CAPS
1.0000 | ORAL_CAPSULE | Freq: Every day | ORAL | Status: DC
  Filled 2017-09-07: qty 1

## 2017-09-07 MED ORDER — POTASSIUM CHLORIDE 20 MEQ/15ML (10%) PO SOLN
40.0000 meq | ORAL | Status: AC
  Administered 2017-09-07 (×2): 40 meq
  Filled 2017-09-07 (×2): qty 30

## 2017-09-07 MED ORDER — MIDAZOLAM HCL 2 MG/2ML IJ SOLN
2.0000 mg | INTRAMUSCULAR | Status: DC | PRN
  Administered 2017-09-07 (×2): 2 mg via INTRAVENOUS
  Administered 2017-09-07 – 2017-09-08 (×9): 4 mg via INTRAVENOUS
  Administered 2017-09-09: 2 mg via INTRAVENOUS
  Administered 2017-09-09 – 2017-09-10 (×4): 4 mg via INTRAVENOUS
  Administered 2017-09-10 (×2): 2 mg via INTRAVENOUS
  Filled 2017-09-07: qty 2
  Filled 2017-09-07 (×2): qty 4
  Filled 2017-09-07: qty 2
  Filled 2017-09-07: qty 4
  Filled 2017-09-07: qty 2
  Filled 2017-09-07 (×4): qty 4
  Filled 2017-09-07: qty 2
  Filled 2017-09-07 (×6): qty 4
  Filled 2017-09-07: qty 2
  Filled 2017-09-07: qty 4
  Filled 2017-09-07: qty 2

## 2017-09-07 MED ORDER — SODIUM CHLORIDE 0.9 % IV SOLN
0.4000 ug/kg/h | INTRAVENOUS | Status: DC
  Administered 2017-09-07 – 2017-09-08 (×9): 1.2 ug/kg/h via INTRAVENOUS
  Administered 2017-09-09 – 2017-09-10 (×7): 1.3 ug/kg/h via INTRAVENOUS
  Administered 2017-09-10: 1.2 ug/kg/h via INTRAVENOUS
  Administered 2017-09-10: 0.8 ug/kg/h via INTRAVENOUS
  Administered 2017-09-10: 1.7 ug/kg/h via INTRAVENOUS
  Administered 2017-09-11: 1.2 ug/kg/h via INTRAVENOUS
  Filled 2017-09-07 (×21): qty 4

## 2017-09-07 MED ORDER — DEXMEDETOMIDINE HCL IN NACL 200 MCG/50ML IV SOLN
0.4000 ug/kg/h | INTRAVENOUS | Status: DC
  Administered 2017-09-07: 0.7 ug/kg/h via INTRAVENOUS
  Administered 2017-09-07 (×2): 1.2 ug/kg/h via INTRAVENOUS
  Filled 2017-09-07: qty 50
  Filled 2017-09-07: qty 100
  Filled 2017-09-07: qty 50

## 2017-09-07 MED ORDER — PROSIGHT PO TABS
1.0000 | ORAL_TABLET | Freq: Every day | ORAL | Status: DC
  Administered 2017-09-07 – 2017-09-18 (×9): 1 via ORAL
  Filled 2017-09-07 (×12): qty 1

## 2017-09-07 MED ORDER — DEXTROSE 50 % IV SOLN
INTRAVENOUS | Status: AC
  Administered 2017-09-07: 25 mL
  Filled 2017-09-07: qty 50

## 2017-09-07 NOTE — Plan of Care (Signed)
Pt continues to receive Vital High Protein @ 67m/hr via OG tube and received 425mof Lasix during this shift. Pt is diuresing greater than 30cc of urine an hour. Pt is being turned Q2 and barrier cream was applied to buttocks area to help prevent further skin breakdown.

## 2017-09-07 NOTE — Progress Notes (Signed)
Interval changes noted. Fever spikes, hemoptysis, diffuse bronchitis. Overall prognosis remains guarded. Please call us back, if any questions.   Nigel Mormon, MD [facility] [place] Pager: [phone] Office: [phone] If no answer Cell [phone]

## 2017-09-07 NOTE — Progress Notes (Addendum)
Nutrition Follow-up  DOCUMENTATION CODES:   Obesity unspecified  INTERVENTION:   Change to vital 1.2 @ goal rate of 90m/hr  Propofol: 12.7 ml/hr- provides 335kcal/day   Free water flushes 1041mhr q4  Regimen provides 1919kcal/day, 99g/day, 167173may   Ocuvite daily via tube for wound healing (provides zinc, vitamin A, vitamin C, Vitamin E, copper, and selenium)  NUTRITION DIAGNOSIS:   Inadequate oral intake related to inability to eat as evidenced by NPO status.  Ongoing  GOAL:   Provide needs based on ASPEN/SCCM guidelines  Meeting with tube feeding  MONITOR:   Vent status, TF tolerance, Weight trends, Labs  REASON FOR ASSESSMENT:   Consult, Ventilator Enteral/tube feeding initiation and management  ASSESSMENT:   [age]o. female with medical history significant of mitral stenosis, DM type II, CHF last EF 65-70% with grade 1 diastolic dysfunction  Pt continues to be sedated and intubated. Will adjust tube feeds r/t propofol change. Pt tolerating tube feeds well. Per chart, pt with ~7lb weight gain since admit. I & O's + 1.2L. BNP slightly higher today. Free water being managed by MD. Pt at risk for refeeding. Low P and K today; recommend monitor K, Mg, and P until labs stabilize. Pt with elevated triglycerides; RD will monitor. New wound; recommend Ocuvite.   Patient is currently intubated on ventilator support MV: 11.6 L/min Temp (24hrs), Avg:99.8 F (37.7 C), Min:98.8 F (37.1 C), Max:100.6 F (38.1 C)  Propofol: 12.7 ml/hr- provides 335kcal/day   Medications reviewed and include: senokot, lasix, insulin, protonix, NaCl _0 /hr, precedex, fentanyl, meropenem, propofol  Labs reviewed: Na 148(H), K 2.9(L), BUN 31(H), Ca 8.6(L), P 2.1(L), Mg 1.9 wnl BNP- 207.8(H) Triglycerides 328(H)- 3/15 Wbc- 10.8(H), Hgb 8.4(L), Hct 29.9(L) cbgs- 190, 129 x 24 hrs  Diet Order:  NPO   EDUCATION NEEDS:   No education needs have been identified at this time  Skin:   Skin Assessment: (deep tissue injury coccyx 3 cm x 2.6 cm )  Last BM:  09/02/17  Height:   Ht Readings from Last 1 Encounters:  08/28/17 _1  (1.676 m)    Weight:   Wt Readings from Last 1 Encounters:  09/07/17 187 lb 2.7 oz (84.9 kg)    Ideal Body Weight:  59.1 kg  BMI:  Body mass index is 30.21 kg/m.  Estimated Nutritional Needs:   Kcal:  1919kcal/day   Protein:  90-100g/day   Fluid:  >1.7L/day   CasKoleen Distance, RD, LDN Pager #- [phone] 7890ter Hours Pager: [phone]

## 2017-09-07 NOTE — Progress Notes (Signed)
PULMONARY / CRITICAL CARE MEDICINE   Name: Holly Figueroa MRN: [id number] DOB: 09-24-1965    ADMISSION DATE:  08/24/2017 CONSULTATION DATE:  08/24/2017  REFERRING MD:  Dr. Tamala Julian / [facility]   CHIEF COMPLAINT:  SOB  BRIEF SUMMARY:  52 yo female smoker admitted at [facility] 2/25 with PNA, hematemesis with esophagitis on EGD from 06/12/17, UTI.  She presented 3/02 with progressive dyspnea and hemoptysis.  Found to have influenza B with PNA and ARDS.  Intubated.  Had refractory hypoxia from pulmonary edema in setting of hyperdynamic state, valvular heart disease, and diastolic CHF.  PMHx of mitral stenosis, diastolic CHF, DM, Depression.  SUBJECTIVE:   Difficult to sedated  VITAL SIGNS: BP 108/60   Pulse 73   Temp 99 F (37.2 C)   Resp (!) 28   Ht _0  (1.676 m)   Wt 84.9 kg (187 lb 2.7 oz)   SpO2 96%   BMI 30.21 kg/m   HEMODYNAMICS: PAP: (44-81)/(21-49) 62/29 CVP:  [7 mmHg-21 mmHg] 12 mmHg PCWP:  [24 mmHg-33 mmHg] 33 mmHg CO:  [6.4 L/min-7.4 L/min] 7.4 L/min CI:  [3.2 L/min/m2-3.7 L/min/m2] 3.7 L/min/m2  VENTILATOR SETTINGS: Vent Mode: PCV FiO2 (%):  [50 %-60 %] 50 % Set Rate:  [35 bmp] 35 bmp PEEP:  [12 cmH20] 12 cmH20 Plateau Pressure:  [17 cmH20-24 cmH20] 19 cmH20  INTAKE / OUTPUT: I/O last 3 completed shifts: In: 4571.7 [I.V.:2686.7; Blood:315; NG/GT:1070; IV Piggyback:500] Out: 4150 [Urine:3850; Stool:300]  PHYSICAL EXAMINATION:  General:  Well nourished female on vent HEENT: ET-> vent UTM:LYYTKPT Neuro: arouses despite high sedation CV: s1s2 rrr, no m/r/g PULM: crackles in bases. Mild hemoptysis  WS:FKCL, non-tender, bsx4 active  Extremities: warm/dry, +edema  Skin: no rashes or lesions   LABS:  BMET Recent Labs  Lab 09/05/17 0454 09/06/17 0307 09/07/17 0426  NA 148* 145 148*  K 4.3 3.9 2.9*  CL 106 105 108  CO2 33* 31 32  BUN 34* 32* 31*  CREATININE 0.71 0.77 0.67  GLUCOSE 137* 190* 129*    Electrolytes Recent Labs  Lab 09/04/17 0413  09/05/17 0454 09/06/17 0307 09/07/17 0426  CALCIUM 8.7* 8.5* 8.6* 8.6*  MG 2.3 2.1 2.2 1.9  PHOS 3.2 2.8  --  2.1*    CBC Recent Labs  Lab 09/05/17 0454 09/05/17 2131 09/06/17 0307 09/07/17 0426  WBC 11.2*  --  11.4* 10.8*  HGB 7.1* 8.8* 8.3* 8.4*  HCT 25.3* 29.8* 28.2* 29.9*  PLT 465*  --  439* 466*    Coag's Recent Labs  Lab 09/05/17 1513  INR 1.19    Sepsis Markers Recent Labs  Lab 09/05/17 0452 09/06/17 0307 09/07/17 0426  PROCALCITON 3.58 3.36 2.16    ABG Recent Labs  Lab 09/05/17 1930 09/05/17 2134 09/07/17 0419  PHART 7.224* 7.319* 7.461*  PCO2ART 82.8* 72.8* 36.4  PO2ART 135* 225.0* 187*    Liver Enzymes No results for input(s): AST, ALT, ALKPHOS, BILITOT, ALBUMIN in the last 168 hours.  Cardiac Enzymes No results for input(s): TROPONINI, PROBNP in the last 168 hours.  Glucose Recent Labs  Lab 09/06/17 1638 09/06/17 1937 09/06/17 2329 09/07/17 0350 09/07/17 0425 09/07/17 0806  GLUCAP 163* 154* 121* 67 125* 118*    Imaging Dg Chest Port 1 View  Result Date: 09/07/2017 CLINICAL DATA:  Respiratory failure EXAM: PORTABLE CHEST 1 VIEW COMPARISON:  Chest radiograph 09/06/2017 FINDINGS: Endotracheal tube tip is at the level of the clavicular heads. Enteric tube courses beyond the diaphragm. Pulmonary arterial catheter tip  overlies the main pulmonary artery. Left IJ approach central venous catheter tip is at the cavoatrial junction. Unchanged mild pulmonary edema and mild cardiomegaly. IMPRESSION: Unchanged support apparatus.  Unchanged pulmonary edema. Electronically Signed   By: Ulyses Jarred M.D.   On: 09/07/2017 05:10   Dg Abd Portable 1v  Result Date: 09/06/2017 CLINICAL DATA:  OG tube placement. EXAM: PORTABLE ABDOMEN - 1 VIEW COMPARISON:  One-view chest x-ray from earlier the same day. FINDINGS: Endotracheal tube, Swan-Ganz catheter, and right IJ line are stable. The heart is mildly enlarged. Mild edema and bilateral effusions are  stable. The side port of the OG tube is in the fundus the stomach. Bowel gas pattern is normal. IMPRESSION: 1. Side port of the OG tube is in the fundus of the stomach. 2. Support apparatus of the chest is stable. 3. Borderline cardiomegaly with mild edema and bilateral effusions are stable. Electronically Signed   By: San Morelle M.D.   On: 09/06/2017 11:03   STUDIES:  TTE 3/3 >> EF 65 to 70%, grade 1 DD, mod AS,  LE Venous Duplex 3/3 >> negative  TEE 3/15 >> mod LVH, mild/mod MS, mod AS  CULTURES: BCx2 3/2 >> negative  Strep Pneumo Antigen 3/3 >> negative  RVP 3/3 >> influenza B positive  Tracheal Aspirate 3/5 >> negative  BCx2 3/7 >> negative BAL 3/8 >>negative Sputum 3/14 >>  ANTIBIOTICS: Zosyn 3/2 >> 3/8 Vanco 3/2 >> 3/4  Meropenem 3/8  >>  Vanco 3/8  >> 3/11  SIGNIFICANT EVENTS: 3/02  Admit with SOB, vomited on BiPAP > intubated, ARDS  3/03  Placed on paralytics, requiring pressors 3/05  PRBC x1.  Paralytics stopped 3/07  Off pressors. 3/08  Bronch >> unexpectedly showed focal bleeding from right upper lobe, anterior segment 3/09 Reparalysed, proned 3/10 placed supine 3/11 NMB stopped.  3/13 worsening hypoxia, nimbex resumed 3/14 placing PA catheter and then dissipating transition to [facility] 3/15 TEE, nimbex d/c'ed  LINES/TUBES: ETT 3/2 >>  L IJ TLC 3/3 >> Rt radial A line 3/9>> Rt IJ PA catheter 3/14 >>  DISCUSSION:   Abg adequate. New hemoptysis(no anticoagulation)(hgb stable). Change to precedex foe high lipids.  ASSESSMENT / PLAN:  Acute respiratory failure with hypoxia/hypercapnia initially from Influenza B with PNA and ARDS, but more recently from acute pulmonary edema. 3/16 with mild hemoptysis. - Increase PCV to 16/12 for Vt 235 - diuresis - daily cxr  Recurrent fever 3/11. 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 Mod AS, mild/mod MS by TEE 3/15. - PA cath PAD 27. CI 3.8 -No pressors  Upper GI  bleed from esophagitis February 2019. - pppi - was to have f/u EGD by Dr. Barney Drain in 3 weeks >> GI eval soon  Anemia of critical illness and GI bleed. Recent Labs    09/06/17 0307 09/07/17 0426  HGB 8.3* 8.4*    - treat <7.0 -INR PTT -Hemoptysis -Hold antocoagulants  DM type II. CBG (last 3)  Recent Labs    09/07/17 0350 09/07/17 0425 09/07/17 0806  GLUCAP 67 125* 118*    - SSI  Acute metabolic encephalopathy. Hx of anxiety, depression, panic attacks on chronic benzodiazepines. Sedation as needed note she is resistant to fentanyl and to prevent Daily wakeup assessment 09/07/2017 will start precedex  DVT prophylaxis - SCDs SUP - protonix Nutrition - tube feeds Goals of care - full code  No family at bedside    NP critical  care time 30 minutes.   Richardson Landry Minor ACNP Maryanna Shape PCCM Pager [phone] till 1 pm If no answer page [phone] 09/07/2017, 9:18 AM

## 2017-09-07 NOTE — Progress Notes (Signed)
PCCM Video Bronchoscopy Procedure Note  The patient was informed of the risks (including but not limited to bleeding, infection, respiratory failure, lung injury, tooth/oral injury) and benefits of the procedure and gave consent, see chart.  Indication: hemoptysis  Post Procedure Diagnosis: diffuse acute bronchitis  Location: [facility] bed 2H 12  Condition pre procedure: critically ill on vent  Medications for procedure: versed, fentanyl drip, precedex  Procedure description: The bronchoscope was introduced through the endotracheal tube and passed to the bilateral lungs to the level of the subsegmental bronchi throughout the tracheobronchial tree.  Airway exam revealed bloody secretions and bright red blood throughout the airways bilaterally, there was more blood and blood clots in the left lung than right.  There appeared to be acute bronchitis changes.  Visualization was severely limited by the amount of blood.  Sucitoning of the left lower lobe helped clear the airway.  There were no masses seen but again visualization was limited.  Approximately 75 cc bright red blood removed.  Procedures performed: none  Specimens sent: none  Condition post procedure: critically ill, on vent  EBL: none from bronchoscopy  Complications: none immediate  Roselie Awkward, MD [place] PCCM Pager: [phone] Cell: [phone] After 3pm or if no response, call [phone]

## 2017-09-07 NOTE — Progress Notes (Signed)
Pharmacy Antibiotic Note  Holly Figueroa is a 52 y.o. female recently hospitalized and discharged from [facility] on 08/21/17, presented to [facility]  on 08/24/2017 with SOB and subsequently intubated.  Broad abx with vancomycin and zosyn started on admission for sepsis/PNA.  MRSA PCR neg and positive for influ B with vancomycin d/ced on 3/4 and tamiflu started on 3/4, Zosyn continued. With ongoing fevers, pharmacy was consulted to change Zosyn to Meropenem   Today, 09/07/2017: - Day # 14 antibiotics, #9 Meropenem - Tmax 100.6, low-grade temps - WBC 11(no steroids) - SCr improved to 0.6, CrCl ~ 90 ml/min - PCT 19.3 (3/4) > 9.2 (3/6) > 5.4 (3/7) > 2.1 (3/16) - All cultures with this admission have been negative thus far  Plan:  Continue Meropenem 1g IV q8h for now per CCM  Follow up renal fxn, culture results, and clinical course.  No dose adjustments needed, pharmacy to sign off and follow peripherally  _____________________________________  Height: _0  (167.6 cm) Weight: 187 lb 2.7 oz (84.9 kg) IBW/kg (Calculated) : 59.3  Temp (24hrs), Avg:99.8 F (37.7 C), Min:98.8 F (37.1 C), Max:100.6 F (38.1 C)  Recent Labs  Lab 09/03/17 0700 09/04/17 0413 09/05/17 0454 09/06/17 0307 09/07/17 0426  WBC 11.2* 11.5* 11.2* 11.4* 10.8*  CREATININE 0.80 0.74 0.71 0.77 0.67    Estimated Creatinine Clearance: 92.3 mL/min (by C-G formula based on SCr of 0.67 mg/dL).    Allergies  Allergen Reactions  . Iodinated Diagnostic Agents Anaphylaxis  . Aspirin Other (See Comments)    "Possible blood in stool" the 377m. Can take 879m  . Ibuprofen Other (See Comments)    "Possible blood in stool"  . Sulfa Antibiotics Hives  . Zofran [Ondansetron Hcl]     Bad headaches  . Dilaudid [Hydromorphone Hcl] Itching and Palpitations  . Hydrocodone Palpitations    Antimicrobials this admission:  3/2 Vancomycin >>3/4, resume 3/8 >> 3/12 3/2 Zosyn  >> 3/8 3/4 tamiflu>>3/9 3/8 meropenem >>    Microbiology results:  2/25ucx: >100K klebsiella pneu (R= amp, I= unasyn, nitro)  3/2 BCx x2: NGF 3/2 MRSA PCR: negative 3/3 resp panel pcr: Influ B+ 3/5 TA: normal flora 3/3 ur legionella: neg   3/7 BCx:  ngtd 3/8 BAL: stain = no orgs; cx 1k yeast 3/8 BAL (PJP): neg 3/8 BAL (AFB) neg  3/8 BAL (fungal):   Thank you for allowing pharmacy to be a part of this patient's care.  FrErin HearingharmD., BCPS Clinical Pharmacist 09/07/2017 9:58 AM

## 2017-09-07 NOTE — Progress Notes (Signed)
Pt coughing up moderate amounts of bright red blood via ETT.  Called E-link to report new onset of symptoms. No new orders noted. Will continue to monitor during shift.

## 2017-09-07 NOTE — Progress Notes (Signed)
Hypoglycemic Event  CBG: 67  Treatment: 59m of D50IV  Symptoms: Asyptomatic   Follow-up CBG: [id number] Result:125  Possible Reasons for Event: Q4 coverage of 4 units of insulin+sliding scale      JThamas Jaegers

## 2017-09-08 ENCOUNTER — Inpatient Hospital Stay (HOSPITAL_COMMUNITY): Payer: Medicare PPO

## 2017-09-08 DIAGNOSIS — E876 Hypokalemia: Secondary | ICD-10-CM

## 2017-09-08 LAB — PHOSPHORUS: Phosphorus: 2.2 mg/dL — ABNORMAL LOW (ref 2.5–4.6)

## 2017-09-08 LAB — PROTIME-INR
INR: 1.18
Prothrombin Time: 14.9 seconds (ref 11.4–15.2)

## 2017-09-08 LAB — BLOOD GAS, ARTERIAL
ACID-BASE EXCESS: 8.2 mmol/L — AB (ref 0.0–2.0)
BICARBONATE: 32.5 mmol/L — AB (ref 20.0–28.0)
DRAWN BY: 40415
FIO2: 50
O2 SAT: 95.1 %
PATIENT TEMPERATURE: 98.6
PCO2 ART: 47.8 mmHg (ref 32.0–48.0)
PEEP/CPAP: 8 cmH2O
PH ART: 7.447 (ref 7.350–7.450)
PO2 ART: 75.2 mmHg — AB (ref 83.0–108.0)
PRESSURE CONTROL: 16 cmH2O
RATE: 35 resp/min

## 2017-09-08 LAB — GLUCOSE, CAPILLARY
GLUCOSE-CAPILLARY: 153 mg/dL — AB (ref 65–99)
GLUCOSE-CAPILLARY: 208 mg/dL — AB (ref 65–99)
GLUCOSE-CAPILLARY: 219 mg/dL — AB (ref 65–99)
Glucose-Capillary: 186 mg/dL — ABNORMAL HIGH (ref 65–99)
Glucose-Capillary: 209 mg/dL — ABNORMAL HIGH (ref 65–99)
Glucose-Capillary: 213 mg/dL — ABNORMAL HIGH (ref 65–99)
Glucose-Capillary: 220 mg/dL — ABNORMAL HIGH (ref 65–99)

## 2017-09-08 LAB — POCT I-STAT 3, ART BLOOD GAS (G3+)
ACID-BASE EXCESS: 5 mmol/L — AB (ref 0.0–2.0)
BICARBONATE: 29.2 mmol/L — AB (ref 20.0–28.0)
O2 Saturation: 96 %
PH ART: 7.451 — AB (ref 7.350–7.450)
PO2 ART: 84 mmHg (ref 83.0–108.0)
TCO2: 30 mmol/L (ref 22–32)
pCO2 arterial: 42.2 mmHg (ref 32.0–48.0)

## 2017-09-08 LAB — CULTURE, RESPIRATORY W GRAM STAIN

## 2017-09-08 LAB — CULTURE, RESPIRATORY: CULTURE: NORMAL

## 2017-09-08 LAB — BASIC METABOLIC PANEL
ANION GAP: 9 (ref 5–15)
BUN: 24 mg/dL — AB (ref 6–20)
CO2: 31 mmol/L (ref 22–32)
Calcium: 8.9 mg/dL (ref 8.9–10.3)
Chloride: 105 mmol/L (ref 101–111)
Creatinine, Ser: 0.55 mg/dL (ref 0.44–1.00)
GFR calc Af Amer: >60 mL/min (ref >60)
GFR calc non Af Amer: >60 mL/min (ref >60)
Glucose, Bld: 230 mg/dL — ABNORMAL HIGH (ref 65–99)
POTASSIUM: 3.3 mmol/L — AB (ref 3.5–5.1)
Sodium: 145 mmol/L (ref 135–145)

## 2017-09-08 LAB — CBC
HEMATOCRIT: 30.2 % — AB (ref 36.0–46.0)
HEMOGLOBIN: 8.7 g/dL — AB (ref 12.0–15.0)
MCH: 26.5 pg (ref 26.0–34.0)
MCHC: 28.8 g/dL — AB (ref 30.0–36.0)
MCV: 92.1 fL (ref 78.0–100.0)
Platelets: 459 10*3/uL — ABNORMAL HIGH (ref 150–400)
RBC: 3.28 MIL/uL — ABNORMAL LOW (ref 3.87–5.11)
RDW: 16 % — AB (ref 11.5–15.5)
WBC: 12.4 10*3/uL — ABNORMAL HIGH (ref 4.0–10.5)

## 2017-09-08 LAB — C DIFFICILE QUICK SCREEN W PCR REFLEX
C DIFFICILE (CDIFF) TOXIN: NEGATIVE
C DIFFICLE (CDIFF) ANTIGEN: NEGATIVE
C Diff interpretation: NOT DETECTED

## 2017-09-08 LAB — MAGNESIUM: Magnesium: 1.8 mg/dL (ref 1.7–2.4)

## 2017-09-08 LAB — BRAIN NATRIURETIC PEPTIDE: B Natriuretic Peptide: 410.9 pg/mL — ABNORMAL HIGH (ref 0.0–100.0)

## 2017-09-08 MED ORDER — POTASSIUM CHLORIDE 20 MEQ/15ML (10%) PO SOLN
40.0000 meq | Freq: Two times a day (BID) | ORAL | Status: DC
  Administered 2017-09-08 – 2017-09-09 (×3): 40 meq
  Filled 2017-09-08 (×3): qty 30

## 2017-09-08 MED ORDER — POTASSIUM CHLORIDE 20 MEQ/15ML (10%) PO SOLN
20.0000 meq | ORAL | Status: AC
  Administered 2017-09-08 (×2): 20 meq
  Filled 2017-09-08: qty 15

## 2017-09-08 NOTE — Progress Notes (Signed)
[facility] ADULT ICU REPLACEMENT PROTOCOL FOR AM LAB REPLACEMENT ONLY  The patient does apply for the [facility] Adult ICU Electrolyte Replacment Protocol based on the criteria listed below:   1. Is GFR >/= 40 ml/min? Yes.    Patient's GFR today is >60 2. Is urine output >/= 0.5 ml/kg/hr for the last 6 hours? Yes.   Patient's UOP is 1.4 ml/kg/hr 3. Is BUN < 60 mg/dL? Yes.     Patient's BUN today is 24 4. Abnormal electrolyte(s):K 3.3 5. Ordered repletion with: protocol 6. If a panic level lab has been reported, has the CCM MD in charge been notified? No..   Physician:    Ronda Fairly A 09/08/2017 6:17 AM

## 2017-09-08 NOTE — Progress Notes (Signed)
Pt spontaneously woke up and pulled at ETT.  RT and MD notified. STAT CXR obtained.  Orders received for bilat wrist restraints.  Will continue to monitor closely and update as needed.

## 2017-09-08 NOTE — Plan of Care (Signed)
Pt continues to diurese w/ assistance of IV lasix and is eliminating greater than 30cc of urine an hour. Pt continues to be turned Q2. Pt has been afebrile during shift.

## 2017-09-08 NOTE — Progress Notes (Signed)
PULMONARY / CRITICAL CARE MEDICINE   Name: Holly Figueroa MRN: [id number] DOB: 24-May-1966    ADMISSION DATE:  08/24/2017 CONSULTATION DATE:  08/24/2017  REFERRING MD:  Baldo Ash  CHIEF COMPLAINT:  dyspnea  HISTORY OF PRESENT ILLNESS:   52 y/o female with a history of mitral valve repair was admitted with ARDS from influenza B pneumonia. Course has been complicated by pulmonary edema and prolonged respiratory failure.   SUBJECTIVE:  Still coughing up blood Bronch yesterday showed bronchitis throughout both lungs Fever  VITAL SIGNS: BP (!) 171/83   Pulse 64   Temp (!) 100.4 F (38 C)   Resp (!) 25   Ht _0  (1.676 m)   Wt 186 lb 15.2 oz (84.8 kg)   SpO2 99%   BMI 30.17 kg/m   HEMODYNAMICS: PAP: (47-79)/(23-47) 60/24 CVP:  [7 mmHg-17 mmHg] 9 mmHg  VENTILATOR SETTINGS: Vent Mode: PCV FiO2 (%):  [50 %-60 %] 50 % Set Rate:  [35 bmp] 35 bmp PEEP:  [8 cmH20-12 cmH20] 8 cmH20 Plateau Pressure:  [19 cmH20-23 cmH20] 19 cmH20  INTAKE / OUTPUT: I/O last 3 completed shifts: In: 6317.3 [I.V.:3219.8; NG/GT:2597.5; IV Piggyback:500] Out: 7416 [Urine:4325; Stool:550]  PHYSICAL EXAMINATION:  General:  In bed on vent HENT: NCAT ETT in place PULM: CTA B, vent supported breathing CV: RRR, no mgr GI: BS+, soft, nontender MSK: normal bulk and tone Neuro: sedated on vent     LABS:  BMET Recent Labs  Lab 09/07/17 0426 09/07/17 2000 09/08/17 0413  NA 148* 147* 145  K 2.9* 3.6 3.3*  CL 108 108 105  CO2 32 30 31  BUN 31* 28* 24*  CREATININE 0.67 0.61 0.55  GLUCOSE 129* 206* 230*    Electrolytes Recent Labs  Lab 09/05/17 0454 09/06/17 0307 09/07/17 0426 09/07/17 2000 09/08/17 0413  CALCIUM 8.5* 8.6* 8.6* 8.6* 8.9  MG 2.1 2.2 1.9  --  1.8  PHOS 2.8  --  2.1*  --  2.2*    CBC Recent Labs  Lab 09/06/17 0307 09/07/17 0426 09/08/17 0413  WBC 11.4* 10.8* 12.4*  HGB 8.3* 8.4* 8.7*  HCT 28.2* 29.9* 30.2*  PLT 439* 466* 459*    Coag's Recent Labs   Lab 09/05/17 1513 09/08/17 0413  INR 1.19 1.18    Sepsis Markers Recent Labs  Lab 09/05/17 0452 09/06/17 0307 09/07/17 0426  PROCALCITON 3.58 3.36 2.16    ABG Recent Labs  Lab 09/05/17 2134 09/07/17 0419 09/08/17 0431  PHART 7.319* 7.461* 7.447  PCO2ART 72.8* 36.4 47.8  PO2ART 225.0* 187* 75.2*    Liver Enzymes No results for input(s): AST, ALT, ALKPHOS, BILITOT, ALBUMIN in the last 168 hours.  Cardiac Enzymes No results for input(s): TROPONINI, PROBNP in the last 168 hours.  Glucose Recent Labs  Lab 09/07/17 1217 09/07/17 1645 09/07/17 2001 09/08/17 0010 09/08/17 0345 09/08/17 0805  GLUCAP 284* 269* 191* 209* 220* 186*    Imaging Dg Chest Port 1 View  Result Date: 09/07/2017 CLINICAL DATA:  Respiratory failure. EXAM: PORTABLE CHEST 1 VIEW COMPARISON:  Portable chest earlier in the day. FINDINGS: ET tube remains 4.8 cm above carina. Swan-Ganz catheter tip main pulmonary artery on the RIGHT. Nasogastric tube is in the stomach. BILATERAL pulmonary opacities are worse, representing edema, pneumonia, or ARDS. IMPRESSION: Worsening aeration.  Support tubes and apparatus appears stable. Electronically Signed   By: Staci Righter M.D.   On: 09/07/2017 11:53     STUDIES:  TTE 3/3 >> EF 65 to 70%,  grade 1 DD, mod AS,  LE Venous Duplex 3/3 >> negative  TEE 3/15 >> mod LVH, mild/mod MS, mod AS Bronch 3/16 > bronchitis, bloody secretions  CULTURES: BCx2 3/2 >> negative  Strep Pneumo Antigen 3/3 >> negative  RVP 3/3 >> influenza B positive  Tracheal Aspirate 3/5 >> negative  BCx2 3/7 >> negative BAL 3/8 >>negative Resp culture 3/17 >>    ANTIBIOTICS: Zosyn 3/2 >> 3/8 Vanco 3/2 >> 3/4  Meropenem 3/8  >>  Vanco 3/8  >> 3/11  SIGNIFICANT EVENTS: 3/02  Admit with SOB, vomited on BiPAP > intubated, ARDS  3/03  Placed on paralytics, requiring pressors 3/05  PRBC x1.  Paralytics stopped 3/07  Off pressors. 3/08  Bronch >> unexpectedly showed focal bleeding  from right upper lobe, anterior segment 3/09 Reparalysed, proned 3/10 placed supine 3/11 NMB stopped.  3/13 worsening hypoxia, nimbex resumed 3/14 placing PA catheter and then dissipating transition to [facility] 3/15 TEE, nimbex d/c'ed >  3/17 Bronch: diffuse bronchitis, bloody secretions  LINES/TUBES: ETT 3/2 >>  L IJ TLC 3/3 >> Rt radial A line 3/9>> 3/16 Rt IJ PA catheter 3/14 >> 3/16    DISCUSSION: 52 y/o female with influenza B causing ARDS, course complicated by heart failure in setting of prior mitral valvular disease.    ASSESSMENT / PLAN:  PULMONARY A: ARDS> better HCAP Hemoptysis> due to bronchitis P:   D/C ARDS protocol Conventional ventilator settings: continue pressure control, decrease RR, try to wean PEEP and FiO2 to 5 and 40%, goal SaO2 > 90% Full mechanical vent support VAP prevention Daily WUA/SBT Will probably need trach this week   CARDIOVASCULAR A:  Acute Diastolic heart failure Mod MS Mod AS Mild MR P:  Tele Lasix daily > increase dose and frequence F/u cardiology recommendations  RENAL A:   Hypokalemia P:   Replace K Monitor BMET and UOP Replace electrolytes as needed   GASTROINTESTINAL A:   Diarrhea P:   Send c diff  HEMATOLOGIC A:   No acute issues P:  Monitor for bleeding  INFECTIOUS A:   Influenza B > treated L lung HCAP > on mero Fever> c diff? ARDS? Line?  P:   Send c diff Continue meropenem Send resp culture Remove L IJ, place PICC  ENDOCRINE A:   Hyperglycemia P:   SSI  NEUROLOGIC A:   SEdation needs P:   RASS goal: -1 to -2 Fentanyl, precedex, prn versed per PAD protocol   FAMILY  - Updates: I called her husband Jacinto Halim on 3/17 and updated him  - Inter-disciplinary family meet or Palliative Care meeting due by:  day 7  My cc time 35 minutes  Roselie Awkward, MD [place] PCCM Pager: [phone] Cell: [phone] After 3pm or if no response, call [phone]   09/08/2017, 8:35 AM

## 2017-09-08 NOTE — Progress Notes (Signed)
Spoke with Dr Lake Bells re PICC order, elevated temps and possibility of line source.  D/w  48 hr line holiday.  States ok to place PIV's and cancel PICC order if able to obtain PIV.  RN notified of plan.

## 2017-09-08 NOTE — Plan of Care (Signed)
Pt is being turned Q2 to prevent further breakdown of skin. Bilateral wrist restraints continued to help prevent removal of lines/tubes.

## 2017-09-09 ENCOUNTER — Inpatient Hospital Stay (HOSPITAL_COMMUNITY): Payer: Medicare PPO

## 2017-09-09 LAB — CBC WITH DIFFERENTIAL/PLATELET
BASOS ABS: 0 10*3/uL (ref 0.0–0.1)
BASOS PCT: 0 %
Band Neutrophils: 2 %
Blasts: 0 %
EOS ABS: 0 10*3/uL (ref 0.0–0.7)
EOS PCT: 0 %
HCT: 34.2 % — ABNORMAL LOW (ref 36.0–46.0)
HEMOGLOBIN: 10.1 g/dL — AB (ref 12.0–15.0)
LYMPHS ABS: 3.2 10*3/uL (ref 0.7–4.0)
Lymphocytes Relative: 22 %
MCH: 27.4 pg (ref 26.0–34.0)
MCHC: 29.5 g/dL — ABNORMAL LOW (ref 30.0–36.0)
MCV: 92.7 fL (ref 78.0–100.0)
METAMYELOCYTES PCT: 0 %
MONO ABS: 0.4 10*3/uL (ref 0.1–1.0)
MYELOCYTES: 0 %
Monocytes Relative: 3 %
NEUTROS PCT: 73 %
Neutro Abs: 10.9 10*3/uL — ABNORMAL HIGH (ref 1.7–7.7)
Other: 0 %
PLATELETS: UNDETERMINED 10*3/uL (ref 150–400)
PROMYELOCYTES ABS: 0 %
RBC: 3.69 MIL/uL — ABNORMAL LOW (ref 3.87–5.11)
RDW: 16.5 % — ABNORMAL HIGH (ref 11.5–15.5)
WBC: 14.5 10*3/uL — ABNORMAL HIGH (ref 4.0–10.5)
nRBC: 0 /100 WBC

## 2017-09-09 LAB — URINALYSIS, ROUTINE W REFLEX MICROSCOPIC
BILIRUBIN URINE: NEGATIVE
Bacteria, UA: NONE SEEN
GLUCOSE, UA: NEGATIVE mg/dL
Ketones, ur: NEGATIVE mg/dL
LEUKOCYTES UA: NEGATIVE
NITRITE: NEGATIVE
PH: 7 (ref 5.0–8.0)
Protein, ur: NEGATIVE mg/dL
SQUAMOUS EPITHELIAL / LPF: NONE SEEN
Specific Gravity, Urine: 1.008 (ref 1.005–1.030)

## 2017-09-09 LAB — BASIC METABOLIC PANEL
Anion gap: 12 (ref 5–15)
BUN: 24 mg/dL — AB (ref 6–20)
CHLORIDE: 102 mmol/L (ref 101–111)
CO2: 28 mmol/L (ref 22–32)
CREATININE: 0.65 mg/dL (ref 0.44–1.00)
Calcium: 9.2 mg/dL (ref 8.9–10.3)
GFR calc non Af Amer: >60 mL/min (ref >60)
Glucose, Bld: 179 mg/dL — ABNORMAL HIGH (ref 65–99)
Potassium: 4 mmol/L (ref 3.5–5.1)
SODIUM: 142 mmol/L (ref 135–145)

## 2017-09-09 LAB — GLUCOSE, CAPILLARY
GLUCOSE-CAPILLARY: 255 mg/dL — AB (ref 65–99)
Glucose-Capillary: 190 mg/dL — ABNORMAL HIGH (ref 65–99)
Glucose-Capillary: 210 mg/dL — ABNORMAL HIGH (ref 65–99)
Glucose-Capillary: 208 mg/dL — ABNORMAL HIGH (ref 65–99)
Glucose-Capillary: 145 mg/dL — ABNORMAL HIGH (ref 65–99)

## 2017-09-09 LAB — BRAIN NATRIURETIC PEPTIDE: B Natriuretic Peptide: 471.3 pg/mL — ABNORMAL HIGH (ref 0.0–100.0)

## 2017-09-09 LAB — TRIGLYCERIDES: TRIGLYCERIDES: 121 mg/dL (ref <150)

## 2017-09-09 MED ORDER — INSULIN ASPART 100 UNIT/ML ~~LOC~~ SOLN
0.0000 [IU] | SUBCUTANEOUS | Status: DC
  Administered 2017-09-09: 3 [IU] via SUBCUTANEOUS
  Administered 2017-09-09: 11 [IU] via SUBCUTANEOUS
  Administered 2017-09-09: 7 [IU] via SUBCUTANEOUS
  Administered 2017-09-10: 4 [IU] via SUBCUTANEOUS
  Administered 2017-09-10: 7 [IU] via SUBCUTANEOUS
  Administered 2017-09-10 (×2): 3 [IU] via SUBCUTANEOUS
  Administered 2017-09-10: 7 [IU] via SUBCUTANEOUS
  Administered 2017-09-11: 4 [IU] via SUBCUTANEOUS
  Administered 2017-09-11 (×2): 3 [IU] via SUBCUTANEOUS
  Administered 2017-09-12: 4 [IU] via SUBCUTANEOUS
  Administered 2017-09-12 – 2017-09-13 (×4): 3 [IU] via SUBCUTANEOUS
  Administered 2017-09-13: 4 [IU] via SUBCUTANEOUS
  Administered 2017-09-13 (×2): 3 [IU] via SUBCUTANEOUS
  Administered 2017-09-13 (×2): 4 [IU] via SUBCUTANEOUS
  Administered 2017-09-14: 3 [IU] via SUBCUTANEOUS
  Administered 2017-09-14: 4 [IU] via SUBCUTANEOUS
  Administered 2017-09-14: 7 [IU] via SUBCUTANEOUS
  Administered 2017-09-14: 11 [IU] via SUBCUTANEOUS
  Administered 2017-09-14: 4 [IU] via SUBCUTANEOUS
  Administered 2017-09-15: 11 [IU] via SUBCUTANEOUS
  Administered 2017-09-15: 4 [IU] via SUBCUTANEOUS
  Administered 2017-09-15: 3 [IU] via SUBCUTANEOUS
  Administered 2017-09-15: 4 [IU] via SUBCUTANEOUS
  Administered 2017-09-16: 11 [IU] via SUBCUTANEOUS
  Administered 2017-09-16: 3 [IU] via SUBCUTANEOUS
  Administered 2017-09-16: 11 [IU] via SUBCUTANEOUS
  Administered 2017-09-16: 3 [IU] via SUBCUTANEOUS
  Administered 2017-09-16 – 2017-09-17 (×4): 4 [IU] via SUBCUTANEOUS
  Administered 2017-09-17: 3 [IU] via SUBCUTANEOUS

## 2017-09-09 MED ORDER — ANIDULAFUNGIN 100 MG IV SOLR
100.0000 mg | INTRAVENOUS | Status: AC
  Administered 2017-09-10 – 2017-09-15 (×6): 100 mg via INTRAVENOUS
  Filled 2017-09-09 (×6): qty 100

## 2017-09-09 MED ORDER — FUROSEMIDE 10 MG/ML IJ SOLN
5.0000 mg/h | INTRAVENOUS | Status: DC
  Administered 2017-09-09: 5 mg/h via INTRAVENOUS
  Filled 2017-09-09: qty 25

## 2017-09-09 MED ORDER — SODIUM CHLORIDE 0.9 % IV SOLN
200.0000 mg | Freq: Once | INTRAVENOUS | Status: AC
  Administered 2017-09-09: 200 mg via INTRAVENOUS
  Filled 2017-09-09: qty 200

## 2017-09-09 MED ORDER — POTASSIUM CHLORIDE 20 MEQ/15ML (10%) PO SOLN
40.0000 meq | Freq: Three times a day (TID) | ORAL | Status: AC
  Administered 2017-09-09 (×2): 40 meq
  Filled 2017-09-09 (×2): qty 30

## 2017-09-09 MED ORDER — VANCOMYCIN HCL IN DEXTROSE 1-5 GM/200ML-% IV SOLN
1000.0000 mg | Freq: Three times a day (TID) | INTRAVENOUS | Status: DC
  Administered 2017-09-09 – 2017-09-12 (×9): 1000 mg via INTRAVENOUS
  Filled 2017-09-09 (×10): qty 200

## 2017-09-09 NOTE — Consult Note (Addendum)
WOC Consult requested for coccyx.   This was already performed on 3/15; refer to previous consult note for assessment and measurements, and plan of care has been provided for bedside nurses.  Pt is on a low airloss bed for pressure reduction.  [place] team will plan to assess weekly. Julien Girt MSN, RN, [place], [place], [place]

## 2017-09-09 NOTE — Progress Notes (Addendum)
Addendum:  D/w antibiotics with CCM, will add vancomycin and eraxis this morning. If BAL from 3/17 comes back negative will consider de-escalating off anti-fungal coverage.   Plan Add vancmycin 1g q8 hours Eraxis 218m x1 then [id number] hours  Pharmacy Antibiotic Note  MiMARKEYA Figueroa a [age].o. [facility] and subsequently intubated.  Broad abx with vancomycin and zosyn started on admission for sepsis/PNA.  MRSA PCR neg and positive for influ B with vancomycin d/ced on 3/4 and tamiflu started on 3/4, Zosyn continued. With ongoing fevers, pharmacy was consulted to change Zosyn to Meropenem   Today, 09/09/2017: - Day # 16 antibiotics, #11 Meropenem - Febrile again overnight - new blood cultures drawn - WBC 14 - SCr stable at 0.6, CrCl ~ 90 ml/min - PCT 19.3 (3/4) > 9.2 (3/6) > 5.4 (3/7) > 2.1 (3/16) - All cultures with this admission have been negative thus far  Plan:  Continue Meropenem 1g IV q8h for now  Follow up renal fxn, culture results, and clinical course.  No dose adjustments needed to meropenem  Consider antifungal coverage?  _____________________________________  Height: _0  (167.6 cm) Weight: 185 lb 13.6 oz (84.3 kg) IBW/kg (Calculated) : 59.3  Temp (24hrs), Avg:100.9 F (38.3 C), Min:100 F (37.8 C), Max:102.4 F (39.1 C)  Recent Labs  Lab 09/05/17 0454 09/06/17 0307 09/07/17 0426 09/07/17 2000 09/08/17 0413 09/09/17 0351  WBC 11.2* 11.4* 10.8*  --  12.4* 14.5*  CREATININE 0.71 0.77 0.67 0.61 0.55 0.65    Estimated Creatinine Clearance: 92 mL/min (by C-G formula based on SCr of 0.65 mg/dL).    Allergies  Allergen Reactions  . Iodinated Diagnostic Agents Anaphylaxis  . Aspirin Other (See Comments)    "Possible blood in stool" the 3253mCan take 28m67m. Ibuprofen Other (See Comments)    "Possible blood in stool"  . Sulfa Antibiotics Hives  .  Zofran [Ondansetron Hcl]     Bad headaches  . Dilaudid [Hydromorphone Hcl] Itching and Palpitations  . Hydrocodone Palpitations    Antimicrobials this admission:  3/2 Vancomycin >>3/4, resume 3/8 >> 3/12 3/2 Zosyn  >> 3/8 3/4 tamiflu>>3/9 3/8 meropenem >>   Microbiology results:  2/25ucx: >100K klebsiella pneu (R= amp, I= unasyn, nitro)  3/2 BCx x2: NG-Final 3/2 MRSA PCR: negative 3/3 resp panel pcr: Influ B+ treated with tamiflu - complete 3/5 TA: normal flora 3/3 ur legionella: neg 3/7 BCx:  ng-Final 3/8 BAL: stain = no orgs; cx 1k Candida tropicalis 3/8 BAL (PJP): neg 3/8 BAL (AFB) neg  3/8 BAL (fungal): tropicalis/glabrata 3/14 Trach asp - nf 3/17 Cdiff-neg 3/17 TA - ngtd 3/18 blood x 2  Thank you for allowing pharmacy to be a part of this patient's care.  FranErin HearingrmD., BCPS Clinical Pharmacist 09/09/2017 10:26 AM

## 2017-09-09 NOTE — Progress Notes (Signed)
[facility] Progress Note Patient Name: Holly Figueroa DOB: 07/23/1965 MRN: [id number]   Date of Service  09/09/2017  HPI/Events of Note  Fever to 101.3 F - Appears to be on Meropenum and 2. Agitation - Already on Fentanyl and Precedex IV infusions at ceilings and Versed IV PRN. Already on Tylenol and has allergy to Motrin.   eICU Interventions  Will order: 1. Blood Cultures X 2 now.  2. UA now.  3. Ice packs PRN. 4. Increase ceiling on Precedex to 1.7 mcg/kg/hour.      Intervention Category Major Interventions: Other:  [name] Cornelia Copa 09/09/2017, 2:25 AM

## 2017-09-09 NOTE — Progress Notes (Signed)
PULMONARY / CRITICAL CARE MEDICINE   Name: Holly Figueroa MRN: [id number] DOB: 1966/04/07    ADMISSION DATE:  08/24/2017 CONSULTATION DATE:  08/24/2017  REFERRING MD:  Baldo Ash  CHIEF COMPLAINT:  dyspnea  HISTORY OF PRESENT ILLNESS:   52 y/o female with a history of mitral valve repair was admitted with ARDS from influenza B pneumonia. Course has been complicated by pulmonary edema and prolonged respiratory failure.   SUBJECTIVE:  Hemoptysis continues Remains hypoxemic increased to 60% and 5PEEP  VITAL SIGNS: BP (!) 145/79   Pulse 83   Temp (!) 102.4 F (39.1 C)   Resp (!) 28   Ht _0  (1.676 m)   Wt 185 lb 13.6 oz (84.3 kg)   SpO2 93%   BMI 30.00 kg/m   HEMODYNAMICS:    VENTILATOR SETTINGS: Vent Mode: CPAP;PSV FiO2 (%):  [40 %-50 %] 50 % Set Rate:  [25 bmp] 25 bmp Vt Set:  [16 mL] 16 mL PEEP:  [5 cmH20] 5 cmH20 Pressure Support:  [8 cmH20] 8 cmH20 Plateau Pressure:  [16 cmH20-20 cmH20] 20 cmH20  INTAKE / OUTPUT: I/O last 3 completed shifts: In: 6421.1 [I.V.:3121.1; NG/GT:2800; IV Piggyback:500] Out: 0383 [Urine:3160; Stool:100]  PHYSICAL EXAMINATION:  General:  Arousable, in bed, acutely ill appearing, NAD HENT: [place]/AT, PERRL, EOM-I and MMM PULM: Coarse BS diffusely, blood from the ETT CV: RRR, Nl S1/S2 and -M/R/G GI: Soft, NT, ND and +BS MSK: normal bulk and tone Neuro: sedated on vent but arousable and moving all ext to command Skin: Intact  LABS:  BMET Recent Labs  Lab 09/07/17 2000 09/08/17 0413 09/09/17 0351  NA 147* 145 142  K 3.6 3.3* 4.0  CL 108 105 102  CO2 _1 BUN 28* 24* 24*  CREATININE 0.61 0.55 0.65  GLUCOSE 206* 230* 179*   Electrolytes Recent Labs  Lab 09/05/17 0454 09/06/17 0307 09/07/17 0426 09/07/17 2000 09/08/17 0413 09/09/17 0351  CALCIUM 8.5* 8.6* 8.6* 8.6* 8.9 9.2  MG 2.1 2.2 1.9  --  1.8  --   PHOS 2.8  --  2.1*  --  2.2*  --     CBC Recent Labs  Lab 09/07/17 0426 09/08/17 0413 09/09/17 0351   WBC 10.8* 12.4* 14.5*  HGB 8.4* 8.7* 10.1*  HCT 29.9* 30.2* 34.2*  PLT 466* 459* PLATELET CLUMPS NOTED ON SMEAR, UNABLE TO ESTIMATE    Coag's Recent Labs  Lab 09/05/17 1513 09/08/17 0413  INR 1.19 1.18    Sepsis Markers Recent Labs  Lab 09/05/17 0452 09/06/17 0307 09/07/17 0426  PROCALCITON 3.58 3.36 2.16    ABG Recent Labs  Lab 09/07/17 0419 09/08/17 0431 09/08/17 1054  PHART 7.461* 7.447 7.451*  PCO2ART 36.4 47.8 42.2  PO2ART 187* 75.2* 84.0    Liver Enzymes No results for input(s): AST, ALT, ALKPHOS, BILITOT, ALBUMIN in the last 168 hours.  Cardiac Enzymes No results for input(s): TROPONINI, PROBNP in the last 168 hours.  Glucose Recent Labs  Lab 09/08/17 1155 09/08/17 1559 09/08/17 1943 09/08/17 2343 09/09/17 0403 09/09/17 0759  GLUCAP 219* 213* 153* 208* 190* 210*    Imaging Dg Chest Port 1 View  Result Date: 09/09/2017 CLINICAL DATA:  Respiratory failure. EXAM: PORTABLE CHEST 1 VIEW COMPARISON:  09/08/2017 FINDINGS: 0554 hours. Endotracheal tube tip is 3.3 cm above the base of the carina. The NG tube passes into the stomach although the distal tip position is not included on the film. The cardio pericardial silhouette is enlarged. Bilateral diffuse  airspace disease with peripheral sparing is similar to prior. No substantial pleural effusion evident. The visualized bony structures of the thorax are intact. Telemetry leads overlie the chest. IMPRESSION: Stable diffuse bilateral airspace opacity with peripheral sparing. Electronically Signed   By: Misty Stanley M.D.   On: 09/09/2017 08:38   Dg Chest Port 1 View  Result Date: 09/08/2017 CLINICAL DATA:  Respiratory distress. EXAM: PORTABLE CHEST 1 VIEW COMPARISON:  09/08/2017 FINDINGS: The endotracheal tube is in good position, 2.8 cm above the carina. The NG tube is coursing down the esophagus and into the stomach. The left IJ central venous catheter has been removed. Persistent diffuse interstitial and  airspace process, likely pulmonary edema. No large pleural effusions and no pneumothorax. IMPRESSION: Interval removal of the left IJ central venous catheter. The endotracheal tube and NG tubes are stable. Persistent diffuse interstitial and airspace process without large effusions. Electronically Signed   By: Marijo Sanes M.D.   On: 09/08/2017 14:46     STUDIES:  TTE 3/3 >> EF 65 to 70%, grade 1 DD, mod AS,  LE Venous Duplex 3/3 >> negative  TEE 3/15 >> mod LVH, mild/mod MS, mod AS Bronch 3/16 > bronchitis, bloody secretions  CULTURES: BCx2 3/2 >> negative  Strep Pneumo Antigen 3/3 >> negative  RVP 3/3 >> influenza B positive  Tracheal Aspirate 3/5 >> negative  BCx2 3/7 >> negative BAL 3/8 >>negative Resp culture 3/17 >>    ANTIBIOTICS: Zosyn 3/2 >> 3/8 Vanco 3/2 >> 3/4>>>3/8>>>3/11>>>3/18>>> Meropenem 3/8  >>   SIGNIFICANT EVENTS: 3/02  Admit with SOB, vomited on BiPAP > intubated, ARDS  3/03  Placed on paralytics, requiring pressors 3/05  PRBC x1.  Paralytics stopped 3/07  Off pressors. 3/08  Bronch >> unexpectedly showed focal bleeding from right upper lobe, anterior segment 3/09 Reparalysed, proned 3/10 placed supine 3/11 NMB stopped.  3/13 worsening hypoxia, nimbex resumed 3/14 placing PA catheter and then dissipating transition to [facility] 3/15 TEE, nimbex d/c'ed >  3/17 Bronch: diffuse bronchitis, bloody secretions  LINES/TUBES: ETT 3/2 >>  L IJ TLC 3/3 >> Rt radial A line 3/9>> 3/16 Rt IJ PA catheter 3/14 >> 3/16  DISCUSSION: 52 y/o female with influenza B causing ARDS, course complicated by heart failure in setting of prior mitral valvular disease.  Discussed with PCCM-NP.  ASSESSMENT / PLAN:  PULMONARY A: ARDS> better HCAP Hemoptysis> due to bronchitis P:   Increase PEEP to 10 and titrate FiO2 for sat of 88-92% Conventional ventilator settings: continue pressure control, decrease RR, try to wean PEEP and FiO2 to 5 and 40%, goal SaO2 > 90% Full  mechanical vent support VAP prevention Daily WUA/SBT Will probably need trach this week if no improvement  CARDIOVASCULAR A:  Acute Diastolic heart failure Mod MS Mod AS Mild MR P:  Tele Diuretics as below F/u cardiology recommendations  RENAL A:   Hypokalemia P:   Replace K Monitor BMET and UOP Replace electrolytes as needed Lasix 5 mg/hr IV drip  GASTROINTESTINAL A:   Diarrhea P:   C diff negative TF per nutrition  HEMATOLOGIC A:   Hemoptysis with no target P:  Monitor for bleeding  INFECTIOUS A:   Influenza B > treated L lung HCAP > on mero Fever> c diff? ARDS? Line?  P:   Continue meropenem Resp culture pending Removed all lines 3/17 PICC line after 72 hours of line holiday  ENDOCRINE A:   Hyperglycemia P:   SSI - increase to resistant Levemir 18 QHS  NEUROLOGIC A:   SEdation needs P:   RASS goal: -1 to -2 Fentanyl, precedex, prn versed per PAD protocol D/C propofol due to hypertriglyceridemia   FAMILY  - Updates: RN to call husband for an appointment tomorrow to discuss trach/peg.  - Inter-disciplinary family meet or Palliative Care meeting due by:  day 7  The patient is critically ill with multiple organ systems failure and requires high complexity decision making for assessment and support, frequent evaluation and titration of therapies, application of advanced monitoring technologies and extensive interpretation of multiple databases.   Critical Care Time devoted to patient care services described in this note is  35  Minutes. This time reflects time of care of this signee Dr Jennet Maduro. This critical care time does not reflect procedure time, or teaching time or supervisory time of PA/NP/Med student/Med Resident etc but could involve care discussion time.  Rush Farmer, M.D. [facility] Pulmonary/Critical Care Medicine. Pager: [phone]. After hours pager: [phone].  09/09/2017, 10:45 AM

## 2017-09-09 NOTE — Progress Notes (Signed)
Inpatient Diabetes Program Recommendations  AACE/ADA: New Consensus Statement on Inpatient Glycemic Control (2015)  Target Ranges:  Prepandial:   less than 140 mg/dL      Peak postprandial:   less than 180 mg/dL (1-2 hours)      Critically ill patients:  140 - 180 mg/dL   Results for Holly Figueroa, Holly Figueroa (MRN [id number]) as of 09/09/2017 09:03  Ref. Range 09/08/2017 08:05 09/08/2017 11:55 09/08/2017 15:59 09/08/2017 19:43 09/08/2017 23:43 09/09/2017 04:03 09/09/2017 07:59  Glucose-Capillary Latest Ref Range: 65 - 99 mg/dL 186 (H) 219 (H) 213 (H) 153 (H) 208 (H) 190 (H) 210 (H)   Review of Glycemic Control  Diabetes history: DM2 Outpatient Diabetes medications: Metformin 1000 mg BID Current orders for Inpatient glycemic control: Levemir 18 units QHS, Novolog 4 units Q4H, Novolog 0-15 units Q4H  Inpatient Diabetes Program Recommendations:  Insulin - Basal: Please consider increasing Levemir to 20 units QHS. Insulin - Tube Feeding Coverage: Please consider increasing tube feeding coverage to Novolog 5 units Q4H.  Thanks, Barnie Alderman, RN, MSN, CDE Diabetes Coordinator Inpatient Diabetes Program [phone] (Team Pager from 8am to 5pm)

## 2017-09-10 ENCOUNTER — Inpatient Hospital Stay (HOSPITAL_COMMUNITY): Payer: Medicare PPO

## 2017-09-10 LAB — CBC
HCT: 33 % — ABNORMAL LOW (ref 36.0–46.0)
Hemoglobin: 10 g/dL — ABNORMAL LOW (ref 12.0–15.0)
MCH: 27.1 pg (ref 26.0–34.0)
MCHC: 30.3 g/dL (ref 30.0–36.0)
MCV: 89.4 fL (ref 78.0–100.0)
PLATELETS: 441 10*3/uL — AB (ref 150–400)
RBC: 3.69 MIL/uL — ABNORMAL LOW (ref 3.87–5.11)
RDW: 16.8 % — AB (ref 11.5–15.5)
WBC: 15 10*3/uL — AB (ref 4.0–10.5)

## 2017-09-10 LAB — BASIC METABOLIC PANEL
ANION GAP: 15 (ref 5–15)
BUN: 20 mg/dL (ref 6–20)
CALCIUM: 8.9 mg/dL (ref 8.9–10.3)
CO2: 29 mmol/L (ref 22–32)
CREATININE: 0.65 mg/dL (ref 0.44–1.00)
Chloride: 95 mmol/L — ABNORMAL LOW (ref 101–111)
Glucose, Bld: 152 mg/dL — ABNORMAL HIGH (ref 65–99)
Potassium: 3.4 mmol/L — ABNORMAL LOW (ref 3.5–5.1)
SODIUM: 139 mmol/L (ref 135–145)

## 2017-09-10 LAB — GLUCOSE, CAPILLARY
GLUCOSE-CAPILLARY: 106 mg/dL — AB (ref 65–99)
GLUCOSE-CAPILLARY: 132 mg/dL — AB (ref 65–99)
GLUCOSE-CAPILLARY: 206 mg/dL — AB (ref 65–99)
Glucose-Capillary: 157 mg/dL — ABNORMAL HIGH (ref 65–99)
Glucose-Capillary: 225 mg/dL — ABNORMAL HIGH (ref 65–99)
Glucose-Capillary: 143 mg/dL — ABNORMAL HIGH (ref 65–99)

## 2017-09-10 LAB — BLOOD GAS, ARTERIAL
Acid-Base Excess: 9.3 mmol/L — ABNORMAL HIGH (ref 0.0–2.0)
Bicarbonate: 33.3 mmol/L — ABNORMAL HIGH (ref 20.0–28.0)
DRAWN BY: 41422
FIO2: 50
LHR: 20 {breaths}/min
O2 Saturation: 93.2 %
PCO2 ART: 45.1 mmHg (ref 32.0–48.0)
PEEP: 10 cmH2O
PH ART: 7.481 — AB (ref 7.350–7.450)
Patient temperature: 98.6
Pressure control: 20 cmH2O
pO2, Arterial: 68.1 mmHg — ABNORMAL LOW (ref 83.0–108.0)

## 2017-09-10 LAB — CULTURE, RESPIRATORY W GRAM STAIN: Culture: NORMAL

## 2017-09-10 LAB — PROTIME-INR
INR: 1.19
Prothrombin Time: 15 seconds (ref 11.4–15.2)

## 2017-09-10 LAB — MAGNESIUM: MAGNESIUM: 1.5 mg/dL — AB (ref 1.7–2.4)

## 2017-09-10 LAB — CULTURE, RESPIRATORY

## 2017-09-10 LAB — PHOSPHORUS: PHOSPHORUS: 3.2 mg/dL (ref 2.5–4.6)

## 2017-09-10 MED ORDER — MIDAZOLAM HCL 2 MG/2ML IJ SOLN: 4.0000 mg | Freq: Once | INTRAMUSCULAR | Status: DC

## 2017-09-10 MED ORDER — VECURONIUM BROMIDE 10 MG IV SOLR: 10.0000 mg | Freq: Once | INTRAVENOUS | Status: DC

## 2017-09-10 MED ORDER — METOLAZONE 5 MG PO TABS
ORAL_TABLET | ORAL | Status: AC
  Administered 2017-09-10: 10 mg via ORAL
  Filled 2017-09-10: qty 2

## 2017-09-10 MED ORDER — PROPOFOL 500 MG/50ML IV EMUL
5.0000 ug/kg/min | Freq: Once | INTRAVENOUS | Status: DC
Start: 2017-09-10 — End: 2017-09-10

## 2017-09-10 MED ORDER — FUROSEMIDE 10 MG/ML IJ SOLN
4.0000 mg/h | INTRAVENOUS | Status: AC
  Administered 2017-09-11: 8 mg/h via INTRAVENOUS
  Filled 2017-09-10: qty 25

## 2017-09-10 MED ORDER — MAGNESIUM SULFATE 2 GM/50ML IV SOLN
2.0000 g | Freq: Once | INTRAVENOUS | Status: AC
  Administered 2017-09-10: 2 g via INTRAVENOUS
  Filled 2017-09-10: qty 50

## 2017-09-10 MED ORDER — ETOMIDATE 2 MG/ML IV SOLN: 40.0000 mg | Freq: Once | INTRAVENOUS | Status: DC

## 2017-09-10 MED ORDER — GERHARDT'S BUTT CREAM
TOPICAL_CREAM | Freq: Two times a day (BID) | CUTANEOUS | Status: DC
  Administered 2017-09-10 – 2017-09-13 (×8): via TOPICAL
  Administered 2017-09-14: 1 via TOPICAL
  Administered 2017-09-14 – 2017-09-17 (×7): via TOPICAL
  Filled 2017-09-10 (×2): qty 1

## 2017-09-10 MED ORDER — POTASSIUM CHLORIDE 20 MEQ/15ML (10%) PO SOLN
40.0000 meq | Freq: Three times a day (TID) | ORAL | Status: AC
Start: 2017-09-10 — End: 2017-09-10
  Administered 2017-09-10 (×2): 40 meq
  Filled 2017-09-10 (×2): qty 30

## 2017-09-10 MED ORDER — FENTANYL CITRATE (PF) 100 MCG/2ML IJ SOLN: 200.0000 ug | Freq: Once | INTRAMUSCULAR | Status: DC

## 2017-09-10 MED ORDER — PROPOFOL 500 MG/50ML IV EMUL: 5.0000 ug/kg/min | Freq: Once | INTRAVENOUS | Status: DC

## 2017-09-10 MED ORDER — METOLAZONE 5 MG PO TABS
10.0000 mg | ORAL_TABLET | Freq: Once | ORAL | Status: AC
  Administered 2017-09-10: 10 mg via ORAL
  Filled 2017-09-10: qty 2

## 2017-09-10 MED ORDER — POTASSIUM CHLORIDE 20 MEQ/15ML (10%) PO SOLN
40.0000 meq | Freq: Once | ORAL | Status: AC
  Administered 2017-09-10: 40 meq
  Filled 2017-09-10: qty 30

## 2017-09-10 NOTE — Consult Note (Signed)
[place] Nurse wound follow up Wound type:DTI to coccyx   Moisture associated skin damage to upper buttocks surrounding wound  Has fecal management system and external urinary manager in place.  Measurement: 3 cm x 5 cm x 0.1 cm  Center is now nonintact Wound GGP:CWTP and moist Drainage (amount, consistency, odor) scant weeping  No odor Periwound:blanchable erythema Dressing procedure/placement/frequency:Switch to Gerhardts barrier cream.  Discontinue silicone foam  COntinue to turn and reposition   Will not follow at this time.  Please re-consult if needed.  Domenic Moras RN BSN [place] Pager [phone]

## 2017-09-10 NOTE — Progress Notes (Signed)
[facility] Progress Note Patient Name: Holly Figueroa DOB: 1966-05-24 MRN: [id number]   Date of Service  09/10/2017  HPI/Events of Note  K+ = 3.4, Mg++ = 1.5 and Creatinine = 0.65.  eICU Interventions  Will replace K+ and Mg++.      Intervention Category Major Interventions: Electrolyte abnormality - evaluation and management   Grosser Eugene 09/10/2017, 5:41 AM

## 2017-09-10 NOTE — Progress Notes (Signed)
PULMONARY / CRITICAL CARE MEDICINE   Name: Holly Figueroa MRN: [id number] DOB: Oct 06, 1965    ADMISSION DATE:  08/24/2017 CONSULTATION DATE:  08/24/2017  REFERRING MD:  Baldo Ash  CHIEF COMPLAINT:  dyspnea  HISTORY OF PRESENT ILLNESS:   52 y/o female with a history of mitral valve repair was admitted with ARDS from influenza B pneumonia. Course has been complicated by pulmonary edema and prolonged respiratory failure.   SUBJECTIVE:  Resolving hemoptysis Hypoxemia improving with diureses No further events overnight  VITAL SIGNS: BP (!) 98/48   Pulse 60   Temp 98.4 F (36.9 C) (Esophageal)   Resp (!) 29   Ht _0  (1.676 m)   Wt 182 lb 15.7 oz (83 kg)   SpO2 100%   BMI 29.53 kg/m   HEMODYNAMICS:    VENTILATOR SETTINGS: Vent Mode: PCV FiO2 (%):  [40 %-60 %] 40 % Set Rate:  [20 bmp-25 bmp] 20 bmp PEEP:  [5 cmH20-10 cmH20] 10 cmH20 Plateau Pressure:  [20 cmH20-27 cmH20] 27 cmH20  INTAKE / OUTPUT: I/O last 3 completed shifts: In: 6753.5 [I.V.:2818.5; NG/GT:2635; IV Piggyback:1300] Out: 4 [Urine:4750; Emesis/NG output:900; Stool:150]  PHYSICAL EXAMINATION:  General:  Acutely ill appearing female, NAD HENT: [place]/AT, PERRL, EOM-I and MMM PULM: Coarse BS diffusely CV: RRR, Nl S1/S2 and -M/R/G. GI: Soft, NT, ND and +BS MSK: WNL Neuro: Sedated but arousable to move all ext to command Skin: Intact  LABS:  BMET Recent Labs  Lab 09/08/17 0413 09/09/17 0351 09/10/17 0235  NA 145 142 139  K 3.3* 4.0 3.4*  CL 105 102 95*  CO2 _1 BUN 24* 24* 20  CREATININE 0.55 0.65 0.65  GLUCOSE 230* 179* 152*   Electrolytes Recent Labs  Lab 09/07/17 0426  09/08/17 0413 09/09/17 0351 09/10/17 0235  CALCIUM 8.6*   < > 8.9 9.2 8.9  MG 1.9  --  1.8  --  1.5*  PHOS 2.1*  --  2.2*  --  3.2   < > = values in this interval not displayed.    CBC Recent Labs  Lab 09/08/17 0413 09/09/17 0351 09/10/17 0235  WBC 12.4* 14.5* 15.0*  HGB 8.7* 10.1* 10.0*  HCT  30.2* 34.2* 33.0*  PLT 459* PLATELET CLUMPS NOTED ON SMEAR, UNABLE TO ESTIMATE 441*    Coag's Recent Labs  Lab 09/05/17 1513 09/08/17 0413  INR 1.19 1.18    Sepsis Markers Recent Labs  Lab 09/05/17 0452 09/06/17 0307 09/07/17 0426  PROCALCITON 3.58 3.36 2.16    ABG Recent Labs  Lab 09/08/17 0431 09/08/17 1054 09/10/17 0538  PHART 7.447 7.451* 7.481*  PCO2ART 47.8 42.2 45.1  PO2ART 75.2* 84.0 68.1*    Liver Enzymes No results for input(s): AST, ALT, ALKPHOS, BILITOT, ALBUMIN in the last 168 hours.  Cardiac Enzymes No results for input(s): TROPONINI, PROBNP in the last 168 hours.  Glucose Recent Labs  Lab 09/09/17 1203 09/09/17 1530 09/09/17 1952 09/09/17 2352 09/10/17 0359 09/10/17 0826  GLUCAP 255* 208* 145* 206* 132* 157*    Imaging Dg Chest Port 1 View  Result Date: 09/10/2017 CLINICAL DATA:  Respiratory failure. EXAM: PORTABLE CHEST 1 VIEW COMPARISON:  Chest x-ray from yesterday. FINDINGS: The patient is rotated to the right. Unchanged endotracheal and enteric tubes. Stable cardiomediastinal silhouette. Diffuse interstitial and alveolar opacities in the left greater than right lungs appear worsened when compared to prior study, especially on the left. No pleural effusion or pneumothorax. No acute osseous abnormality. IMPRESSION: Worsening diffuse bilateral  interstitial and airspace disease. Electronically Signed   By: Titus Dubin M.D.   On: 09/10/2017 07:43     STUDIES:  TTE 3/3 >> EF 65 to 70%, grade 1 DD, mod AS,  LE Venous Duplex 3/3 >> negative  TEE 3/15 >> mod LVH, mild/mod MS, mod AS Bronch 3/16 > bronchitis, bloody secretions  CULTURES: BCx2 3/2 >> negative  Strep Pneumo Antigen 3/3 >> negative  RVP 3/3 >> influenza B positive  Tracheal Aspirate 3/5 >> negative  BCx2 3/7 >> negative BAL 3/8 >>negative Resp culture 3/17 >>   ANTIBIOTICS: Zosyn 3/2 >> 3/8 Vanco 3/2 >> 3/4>>>3/8>>>3/11>>>3/18>>> Meropenem 3/8  >>   SIGNIFICANT  EVENTS: 3/02  Admit with SOB, vomited on BiPAP > intubated, ARDS  3/03  Placed on paralytics, requiring pressors 3/05  PRBC x1.  Paralytics stopped 3/07  Off pressors. 3/08  Bronch >> unexpectedly showed focal bleeding from right upper lobe, anterior segment 3/09 Reparalysed, proned 3/10 placed supine 3/11 NMB stopped.  3/13 worsening hypoxia, nimbex resumed 3/14 placing PA catheter and then dissipating transition to [facility] 3/15 TEE, nimbex d/c'ed >  3/17 Bronch: diffuse bronchitis, bloody secretions  LINES/TUBES: ETT 3/2 >>  L IJ TLC 3/3 >> Rt radial A line 3/9>> 3/16 Rt IJ PA catheter 3/14 >> 3/16  DISCUSSION: 52 y/o female with influenza B causing ARDS, course complicated by heart failure in setting of prior mitral valvular disease.  Discussed with PCCM-NP.  ASSESSMENT / PLAN:  PULMONARY A: ARDS> better HCAP Hemoptysis> due to bronchitis P:   Maintain PEEP to 10 and titrate FiO2 for sat of 88-92% Trach in AM at 1 PM Full mechanical vent support for now, do not change PEEP VAP prevention Daily WUA/SBT  CARDIOVASCULAR A:  Acute Diastolic heart failure Mod MS Mod AS Mild MR P:  Tele Diuretics as below F/u cardiology recommendations  RENAL A:   Hypokalemia P:   Replace K Monitor BMET and UOP Replace electrolytes as needed Increase Lasix 10 mg/hr IV drip Add zaroxolyn 10 mg PO x1  GASTROINTESTINAL A:   Diarrhea P:   C diff negative TF per nutrition  HEMATOLOGIC A:   Hemoptysis with no target P:  Monitor for bleeding Transfuse per ICU protocol  INFECTIOUS A:   Influenza B > treated L lung HCAP > on mero Fever> c diff? ARDS? Line?  P:   Continue meropenem Resp culture NTD Removed all lines 3/17 PICC line after 72 hours of line holiday  ENDOCRINE A:   Hyperglycemia P:   SSI - increase to resistant Levemir 18 QHS  NEUROLOGIC A:   SEdation needs P:   RASS goal: -1 to -2 Fentanyl, precedex, prn versed per PAD protocol  FAMILY   - Updates: Spoke with husband and patient informed that patient is at the point where she needs a trach, they were both agreeable and will proceed with trach tomorrow at 1 PM  - Inter-disciplinary family meet or Palliative Care meeting due by:  day 7  The patient is critically ill with multiple organ systems failure and requires high complexity decision making for assessment and support, frequent evaluation and titration of therapies, application of advanced monitoring technologies and extensive interpretation of multiple databases.   Critical Care Time devoted to patient care services described in this note is  35  Minutes. This time reflects time of care of this signee Dr Jennet Maduro. This critical care time does not reflect procedure time, or teaching time or supervisory time of PA/NP/Med student/Med  Resident etc but could involve care discussion time.  Rush Farmer, M.D. [facility] Pulmonary/Critical Care Medicine. Pager: [phone]. After hours pager: [phone].  09/10/2017, 10:07 AM

## 2017-09-11 ENCOUNTER — Inpatient Hospital Stay (HOSPITAL_COMMUNITY): Payer: Medicare PPO

## 2017-09-11 ENCOUNTER — Encounter (HOSPITAL_COMMUNITY): Payer: Medicare PPO

## 2017-09-11 DIAGNOSIS — J101 Influenza due to other identified influenza virus with other respiratory manifestations: Secondary | ICD-10-CM

## 2017-09-11 LAB — CBC WITH DIFFERENTIAL/PLATELET
Basophils Absolute: 0 K/uL (ref 0.0–0.1)
Basophils Relative: 0 %
Eosinophils Absolute: 0 K/uL (ref 0.0–0.7)
Eosinophils Relative: 0 %
HCT: 32.5 % — ABNORMAL LOW (ref 36.0–46.0)
Hemoglobin: 10 g/dL — ABNORMAL LOW (ref 12.0–15.0)
Lymphocytes Relative: 13 %
Lymphs Abs: 1.8 K/uL (ref 0.7–4.0)
MCH: 27.4 pg (ref 26.0–34.0)
MCHC: 30.8 g/dL (ref 30.0–36.0)
MCV: 89 fL (ref 78.0–100.0)
Monocytes Absolute: 0.6 K/uL (ref 0.1–1.0)
Monocytes Relative: 4 %
Neutro Abs: 11.6 K/uL — ABNORMAL HIGH (ref 1.7–7.7)
Neutrophils Relative %: 83 %
Platelets: 642 K/uL — ABNORMAL HIGH (ref 150–400)
RBC: 3.65 MIL/uL — ABNORMAL LOW (ref 3.87–5.11)
RDW: 17.1 % — ABNORMAL HIGH (ref 11.5–15.5)
WBC: 14.1 K/uL — ABNORMAL HIGH (ref 4.0–10.5)

## 2017-09-11 LAB — BLOOD GAS, ARTERIAL
Acid-Base Excess: 14.6 mmol/L — ABNORMAL HIGH (ref 0.0–2.0)
Bicarbonate: 38.4 mmol/L — ABNORMAL HIGH (ref 20.0–28.0)
Drawn by: 39898
FIO2: 40
LHR: 20 {breaths}/min
O2 Saturation: 94.4 %
PATIENT TEMPERATURE: 98.4
PCO2 ART: 42.8 mmHg (ref 32.0–48.0)
PEEP: 10 cmH2O
Pressure control: 20 cmH2O
pH, Arterial: 7.561 — ABNORMAL HIGH (ref 7.350–7.450)
pO2, Arterial: 69.3 mmHg — ABNORMAL LOW (ref 83.0–108.0)

## 2017-09-11 LAB — MAGNESIUM: MAGNESIUM: 2.2 mg/dL (ref 1.7–2.4)

## 2017-09-11 LAB — BASIC METABOLIC PANEL
ANION GAP: 15 (ref 5–15)
BUN: 24 mg/dL — ABNORMAL HIGH (ref 6–20)
CALCIUM: 9.3 mg/dL (ref 8.9–10.3)
CO2: 31 mmol/L (ref 22–32)
Chloride: 90 mmol/L — ABNORMAL LOW (ref 101–111)
Creatinine, Ser: 0.74 mg/dL (ref 0.44–1.00)
Glucose, Bld: 86 mg/dL (ref 65–99)
Potassium: 4.1 mmol/L (ref 3.5–5.1)
SODIUM: 136 mmol/L (ref 135–145)

## 2017-09-11 LAB — CBC
HCT: 28 % — ABNORMAL LOW (ref 36.0–46.0)
Hemoglobin: 8.4 g/dL — ABNORMAL LOW (ref 12.0–15.0)
MCH: 27.3 pg (ref 26.0–34.0)
MCHC: 30 g/dL (ref 30.0–36.0)
MCV: 90.9 fL (ref 78.0–100.0)
PLATELETS: 554 10*3/uL — AB (ref 150–400)
RBC: 3.08 MIL/uL — AB (ref 3.87–5.11)
RDW: 17.2 % — AB (ref 11.5–15.5)
WBC: 11.9 10*3/uL — AB (ref 4.0–10.5)

## 2017-09-11 LAB — GLUCOSE, CAPILLARY
GLUCOSE-CAPILLARY: 72 mg/dL (ref 65–99)
GLUCOSE-CAPILLARY: 87 mg/dL (ref 65–99)
Glucose-Capillary: 113 mg/dL — ABNORMAL HIGH (ref 65–99)
Glucose-Capillary: 138 mg/dL — ABNORMAL HIGH (ref 65–99)
Glucose-Capillary: 139 mg/dL — ABNORMAL HIGH (ref 65–99)
Glucose-Capillary: 179 mg/dL — ABNORMAL HIGH (ref 65–99)

## 2017-09-11 LAB — PHOSPHORUS: PHOSPHORUS: 4.4 mg/dL (ref 2.5–4.6)

## 2017-09-11 MED ORDER — ORAL CARE MOUTH RINSE
15.0000 mL | Freq: Two times a day (BID) | OROMUCOSAL | Status: DC
  Administered 2017-09-11 – 2017-09-17 (×5): 15 mL via OROMUCOSAL

## 2017-09-11 MED ORDER — PROPOFOL 500 MG/50ML IV EMUL
INTRAVENOUS | Status: AC
  Filled 2017-09-11: qty 50

## 2017-09-11 NOTE — Progress Notes (Addendum)
PULMONARY / CRITICAL CARE MEDICINE   Name: Holly Figueroa MRN: [id number] DOB: [birth year]    ADMISSION DATE:  08/24/2017 CONSULTATION DATE:  08/24/2017  REFERRING MD:  Baldo Ash  CHIEF COMPLAINT:  dyspnea  HISTORY OF PRESENT ILLNESS:   52 y/o female with a history of mitral valve repair was admitted with ARDS from influenza B pneumonia. Course has been complicated by pulmonary edema and prolonged respiratory failure.   SUBJECTIVE:  Hypoxemia improving.   Resolving hemoptysis Awake, no c/o.  On SBT   VITAL SIGNS: BP (!) 94/53   Pulse 61   Temp 98.4 F (36.9 C) (Oral)   Resp 20   Ht _0  (1.676 m)   Wt 81.1 kg (178 lb 12.7 oz)   SpO2 94%   BMI 28.86 kg/m   HEMODYNAMICS:    VENTILATOR SETTINGS: Vent Mode: PCV FiO2 (%):  [40 %] 40 % Set Rate:  [20 bmp] 20 bmp PEEP:  [10 cmH20] 10 cmH20 Plateau Pressure:  [22 cmH20-25 cmH20] 25 cmH20  INTAKE / OUTPUT: I/O last 3 completed shifts: In: 6330.1 [I.V.:2580.1; NG/GT:2070; IV Piggyback:1680] Out: 27 [Urine:8700; Stool:200]  PHYSICAL EXAMINATION:  General:  wdwn female NAD HENT: /AT, PERRL, EOM-I and MMM PULM: resps even non labored on PS wean, Vt 350, scattered rhonchi, few bibasilar crackles  CV: RRR, Nl S1/S2 and -M/R/G. GI: Soft, NT, ND and +BS MSK: WNL Neuro: awake, alert, appropriate, follows commands, MAE  Skin: Intact  LABS:  BMET Recent Labs  Lab 09/09/17 0351 09/10/17 0235 09/11/17 0249  NA 142 139 136  K 4.0 3.4* 4.1  CL 102 95* 90*  CO2 _1 BUN 24* 20 24*  CREATININE 0.65 0.65 0.74  GLUCOSE 179* 152* 86   Electrolytes Recent Labs  Lab 09/08/17 0413 09/09/17 0351 09/10/17 0235 09/11/17 0249  CALCIUM 8.9 9.2 8.9 9.3  MG 1.8  --  1.5* 2.2  PHOS 2.2*  --  3.2 4.4    CBC Recent Labs  Lab 09/09/17 0351 09/10/17 0235 09/11/17 0249  WBC 14.5* 15.0* 11.9*  HGB 10.1* 10.0* 8.4*  HCT 34.2* 33.0* 28.0*  PLT PLATELET CLUMPS NOTED ON SMEAR, UNABLE TO ESTIMATE 441* 554*     Coag's Recent Labs  Lab 09/05/17 1513 09/08/17 0413 09/10/17 2012  INR 1.19 1.18 1.19    Sepsis Markers Recent Labs  Lab 09/05/17 0452 09/06/17 0307 09/07/17 0426  PROCALCITON 3.58 3.36 2.16    ABG Recent Labs  Lab 09/08/17 1054 09/10/17 0538 09/11/17 0317  PHART 7.451* 7.481* 7.561*  PCO2ART 42.2 45.1 42.8  PO2ART 84.0 68.1* 69.3*    Liver Enzymes No results for input(s): AST, ALT, ALKPHOS, BILITOT, ALBUMIN in the last 168 hours.  Cardiac Enzymes No results for input(s): TROPONINI, PROBNP in the last 168 hours.  Glucose Recent Labs  Lab 09/10/17 1242 09/10/17 1628 09/10/17 2111 09/10/17 2358 09/11/17 0353 09/11/17 0823  GLUCAP 225* 106* 143* 138* 72 139*    Imaging Dg Chest Port 1 View  Result Date: 09/11/2017 CLINICAL DATA:  Check endotracheal tube placement EXAM: PORTABLE CHEST 1 VIEW COMPARISON:  09/10/2017 FINDINGS: Cardiac shadow is stable. The degree of vascular congestion has improved significantly with improved pulmonary edema. Some persistent bibasilar atelectasis is noted. The endotracheal tube and nasogastric catheter are stable. IMPRESSION: Improving aeration. Electronically Signed   By: Inez Catalina M.D.   On: 09/11/2017 09:01     STUDIES:  TTE 3/3 >> EF 65 to 70%, grade 1 DD, mod AS,  LE Venous Duplex 3/3 >> negative  TEE 3/15 >> mod LVH, mild/mod MS, mod AS Bronch 3/16 > bronchitis, bloody secretions  CULTURES: BCx2 3/2 >> negative  Strep Pneumo Antigen 3/3 >> negative  RVP 3/3 >> influenza B positive  Tracheal Aspirate 3/5 >> negative  BCx2 3/7 >> negative BAL 3/8 >>negative Resp culture 3/17 >> normal flora   ANTIBIOTICS: Zosyn 3/2 >> 3/8 Vanco 3/2 >> 3/4>>>3/8>>>3/11>>>3/18>>> Meropenem 3/8  >>   SIGNIFICANT EVENTS: 3/02  Admit with SOB, vomited on BiPAP > intubated, ARDS  3/03  Placed on paralytics, requiring pressors 3/05  PRBC x1.  Paralytics stopped 3/07  Off pressors. 3/08  Bronch >> unexpectedly showed  focal bleeding from right upper lobe, anterior segment 3/09 Reparalysed, proned 3/10 placed supine 3/11 NMB stopped.  3/13 worsening hypoxia, nimbex resumed 3/14 placing PA catheter and then dissipating transition to [facility] 3/15 TEE, nimbex d/c'ed >  3/17 Bronch: diffuse bronchitis, bloody secretions  LINES/TUBES: ETT 3/2 >> 3/14 L IJ TLC 3/3 >> Rt radial A line 3/9>> 3/16 Rt IJ PA catheter 3/14 >> 3/16  DISCUSSION: 52 y/o female with influenza B causing ARDS, course complicated by heart failure in setting of prior mitral valvular disease.   ASSESSMENT / PLAN:  PULMONARY A: ARDS> CXR much improved.  HCAP Hemoptysis> due to bronchitis. Resolved.  P:   PS wean as tol  Would NOT trach at this time - looks sig improved.  Would trial extubation prior to trach VAP prevention Daily WUA/SBT  CARDIOVASCULAR A:  Acute Diastolic heart failure Mod MS Mod AS Mild MR P:  Tele Continue diuresis with lasix gtt as Scr and BP tol - wean lasix gtt to 43m/hr  F/u cardiology recommendations  RENAL A:   Hypokalemia P:   Replace K Monitor BMET and UOP Replace electrolytes as needed Continue lasix gtt - decrease to 431mhr    GASTROINTESTINAL A:   Diarrhea - resolved.  P:   NPO for now post extubation  Speech eval   HEMATOLOGIC A:   Hemoptysis - resolved.  Anemia - no obvious source bleeding  P:  Monitor for bleeding Transfuse per ICU protocol  INFECTIOUS A:   Influenza B > treated L lung HCAP > on mero Fever> c diff? ARDS? Line? Improving.   P:   Continue meropenem Resp culture NTD Removed all lines 3/17 May need PICC replaced - hold for now with adequate IV access   ENDOCRINE A:   Hyperglycemia P:   SSI  Levemir - watch with TF off may need to decrease   NEUROLOGIC A:   SEdation needs P:   RASS goal: -1 to -2 Fentanyl, precedex, prn versed per PAD protocol  FAMILY  - Updates: no family at bedside 3/20 - holding off on trach.  If fails this trial  at extubation however will need trach.   - Inter-disciplinary family meet or Palliative Care meeting due by:  day 7 BurtNP 09/11/2017  10:08 AM Pager: [phone] Note:  [age]ear old female with respiratory failure due to influenza B and resultant ARDS that has been intubated for 18 days.  Patient was profoundly hypoxemic but responded very nicely to lasix drip and was able to decrease FiO2 to 40%.  Patient on exam is actually weaning quite nicely.  I reviewed CXR myself, improving aeration noted.  Was planning on a tracheostomy today but patient improved very nicely.  Will attempt extubation today, if patient  fails then will reintubate and trach.  Decrease lasix drip to 4 mg/hr and replace electrolytes.  PCCM will continue to follow.  The patient is critically ill with multiple organ systems failure and requires high complexity decision making for assessment and support, frequent evaluation and titration of therapies, application of advanced monitoring technologies and extensive interpretation of multiple databases.   Critical Care Time devoted to patient care services described in this note is  35  Minutes. This time reflects time of care of this signee Dr Jennet Maduro. This critical care time does not reflect procedure time, or teaching time or supervisory time of PA/NP/Med student/Med Resident etc but could involve care discussion time.  Rush Farmer, M.D. [facility] Pulmonary/Critical Care Medicine. Pager: [phone]. After hours pager: [phone].

## 2017-09-11 NOTE — Procedures (Signed)
Extubation Procedure Note  Patient Details:   Name: Holly Figueroa DOB: 1966/02/05 MRN: [id number]   Airway Documentation:     Evaluation  O2 sats: stable throughout Complications: No apparent complications Patient did tolerate procedure well. Bilateral Breath Sounds: Clear, Diminished   Yes Pt extubated to 4L [place] per MD order, NARD VSS, pt able to speak softly No stridor noted. RN at bedside. Will con to monitor.   Lottie Rater 09/11/2017, 10:22 AM

## 2017-09-12 ENCOUNTER — Inpatient Hospital Stay (HOSPITAL_COMMUNITY): Payer: Medicare PPO

## 2017-09-12 DIAGNOSIS — R1310 Dysphagia, unspecified: Secondary | ICD-10-CM

## 2017-09-12 LAB — BASIC METABOLIC PANEL
Anion gap: 16 — ABNORMAL HIGH (ref 5–15)
BUN: 24 mg/dL — ABNORMAL HIGH (ref 6–20)
CHLORIDE: 91 mmol/L — AB (ref 101–111)
CO2: 30 mmol/L (ref 22–32)
Calcium: 9.7 mg/dL (ref 8.9–10.3)
Creatinine, Ser: 0.92 mg/dL (ref 0.44–1.00)
GFR calc non Af Amer: >60 mL/min (ref >60)
GLUCOSE: 144 mg/dL — AB (ref 65–99)
Potassium: 3 mmol/L — ABNORMAL LOW (ref 3.5–5.1)
Sodium: 137 mmol/L (ref 135–145)

## 2017-09-12 LAB — GLUCOSE, CAPILLARY
GLUCOSE-CAPILLARY: 131 mg/dL — AB (ref 65–99)
GLUCOSE-CAPILLARY: 116 mg/dL — AB (ref 65–99)
GLUCOSE-CAPILLARY: 110 mg/dL — AB (ref 65–99)
Glucose-Capillary: 122 mg/dL — ABNORMAL HIGH (ref 65–99)
Glucose-Capillary: 142 mg/dL — ABNORMAL HIGH (ref 65–99)
Glucose-Capillary: 161 mg/dL — ABNORMAL HIGH (ref 65–99)

## 2017-09-12 MED ORDER — CHLORHEXIDINE GLUCONATE 0.12 % MT SOLN
15.0000 mL | Freq: Two times a day (BID) | OROMUCOSAL | Status: DC
  Administered 2017-09-12 – 2017-09-17 (×11): 15 mL via OROMUCOSAL
  Filled 2017-09-12 (×7): qty 15

## 2017-09-12 MED ORDER — POTASSIUM CHLORIDE 10 MEQ/100ML IV SOLN
10.0000 meq | INTRAVENOUS | Status: AC
  Administered 2017-09-12 (×4): 10 meq via INTRAVENOUS
  Filled 2017-09-12 (×3): qty 100

## 2017-09-12 MED ORDER — ORAL CARE MOUTH RINSE
15.0000 mL | Freq: Two times a day (BID) | OROMUCOSAL | Status: DC
  Administered 2017-09-12 – 2017-09-17 (×10): 15 mL via OROMUCOSAL

## 2017-09-12 NOTE — Evaluation (Signed)
Clinical/Bedside Swallow Evaluation Patient Details  Name: Holly Figueroa MRN: [id number] Date of Birth: 1966/06/05  Today's Date: 09/12/2017 Time: SLP Start Time (ACUTE ONLY): 0813 SLP Stop Time (ACUTE ONLY): 0821 SLP Time Calculation (min) (ACUTE ONLY): 8 min  Past Medical History:  Past Medical History:  Diagnosis Date  . Acute respiratory failure ([place])   . Anemia 08-2008   Blood transfusion  . Anxiety   . Benzodiazepine dependence ([place])   . Cervical cancer ([place])   . CHF (congestive heart failure) ([place])   . Depression   . Diabetes mellitus without complication ([place])   . Headache(784.0)    migraines  . Hypokalemia   . Legionella pneumonia ([place])   . Leukocytosis, unspecified   . Mitral stenosis   . Panic attacks   . Tobacco abuse    Past Surgical History:  Past Surgical History:  Procedure Laterality Date  . BIOPSY  06/12/2017   Procedure: BIOPSY;  Surgeon: Daneil Dolin, MD;  Location: [facility];  Service: Gastroenterology;;  esophagus  . cardiac valve repair  2011   stretched mitral valve  . CERVICAL CONIZATION W/BX  2000  . CHOLECYSTECTOMY  06/26/2012   Procedure: LAPAROSCOPIC CHOLECYSTECTOMY WITH INTRAOPERATIVE CHOLANGIOGRAM;  Surgeon: Ralene Ok, MD;  Location: [facility];  Service: General;  Laterality: N/A;  . COLONOSCOPY WITH PROPOFOL N/A 06/12/2017   Normal colon, normal TI, Grade I internal hemorrhoids  . ESOPHAGOGASTRODUODENOSCOPY (EGD) WITH PROPOFOL N/A 06/12/2017   esophagitis, possibly chemical or pill-induced, s/p dilation. small hiatal hernia, normal duodenal bulb and D 2  . GIVENS CAPSULE STUDY N/A 07/19/2017   incomplete capsule study as did not reach cecum.   Marland Kitchen LASER ABLATION OF THE CERVIX    . MALONEY DILATION  06/12/2017   Procedure: MALONEY DILATION;  Surgeon: Daneil Dolin, MD;  Location: [facility];  Service: Gastroenterology;;  . MOLE REMOVAL    . NASAL SINUS SURGERY     HPI:  Marigrace Mccole Aldersonis a 52 y.o.femalewith medical  history significant ofmitral stenosis, DM type II, CHF last EF 65-70% with grade 1 diastolic dysfunction, mulltifocal PNA 07/2017 presents with worsening dyspnea. Intubated 3/2-3/21. CXR with bilateral infiltrates read as pulm edema. BSE 10/2014 with acute reversible dysphagia after intubation and recommended full liquid diet.    Assessment / Plan / Recommendation Clinical Impression  Pt's hoarse vocal quality, decreased intensity, weak cough and prolonged intubation of 13 days increases aspiration risk.  Oral hygiene performed and immediate cough with likely laryngeal penetration and/or aspiration. MBS scheduled today at 10:00.   SLP Visit Diagnosis: Dysphagia, unspecified (R13.10)    Aspiration Risk  Moderate aspiration risk;Severe aspiration risk    Diet Recommendation NPO        Other  Recommendations Oral Care Recommendations: Oral care QID   Follow up Recommendations Other (comment)(TBD)      Frequency and Duration            Prognosis        Swallow Study   General HPI: Holly Dortch Aldersonis a 52 y.o.femalewith medical history significant ofmitral stenosis, DM type II, CHF last EF 65-70% with grade 1 diastolic dysfunction, mulltifocal PNA 07/2017 presents with worsening dyspnea. Intubated 3/2-3/21. CXR with bilateral infiltrates read as pulm edema. BSE 10/2014 with acute reversible dysphagia after intubation and recommended full liquid diet.  Type of Study: Bedside Swallow Evaluation Previous Swallow Assessment: (see HPI) Diet Prior to this Study: NPO Temperature Spikes Noted: No Respiratory Status: Nasal cannula History of Recent  Intubation: Yes Length of Intubations (days): 19 days Date extubated: 09/11/17 Behavior/Cognition: Alert;Cooperative;Pleasant mood Oral Cavity Assessment: Other (comment)(lingual candidias) Oral Care Completed by SLP: Yes Oral Cavity - Dentition: (2 upper teeth, some lower) Vision: Functional for self-feeding Self-Feeding Abilities: Able  to feed self Patient Positioning: Upright in bed Baseline Vocal Quality: Low vocal intensity;Hoarse Volitional Cough: Weak Volitional Swallow: Able to elicit    Oral/Motor/Sensory Function Overall Oral Motor/Sensory Function: Within functional limits   Ice Chips Ice chips: Not tested   Thin Liquid Thin Liquid: Impaired Presentation: Cup Pharyngeal  Phase Impairments: Cough - Immediate;Cough - Delayed    Nectar Thick Nectar Thick Liquid: Not tested   Honey Thick Honey Thick Liquid: Not tested   Puree Puree: Not tested   Solid   GO   Solid: Not tested        [name] 09/12/2017,8:29 AM   [name][employer] [phone]

## 2017-09-12 NOTE — Progress Notes (Signed)
PULMONARY / CRITICAL CARE MEDICINE   Name: Holly Figueroa MRN: [id number] DOB: [birth year]    ADMISSION DATE:  08/24/2017 CONSULTATION DATE:  08/24/2017  REFERRING MD:  Baldo Ash  CHIEF COMPLAINT:  dyspnea  HISTORY OF PRESENT ILLNESS:   52 y/o female with a history of mitral valve repair was admitted with ARDS from influenza B pneumonia. Course has been complicated by pulmonary edema and prolonged respiratory failure.   SUBJECTIVE:  Tolerating extubation, feels better this AM, some confusion overnight, no new complaints Failed swallow evaluation, going for a barium swallow today  VITAL SIGNS: BP (!) 95/48   Pulse 88   Temp 98.3 F (36.8 C) (Oral)   Resp 17   Ht _0  (1.676 m)   Wt 163 lb 2.3 oz (74 kg)   SpO2 95%   BMI 26.33 kg/m   HEMODYNAMICS:    VENTILATOR SETTINGS:    INTAKE / OUTPUT: I/O last 3 completed shifts: In: 2868 [I.V.:938; NG/GT:430; IV Piggyback:1500] Out: 7050 [Urine:6850; Stool:200]  PHYSICAL EXAMINATION:  General:  Chronically ill appearing female, NAD HENT: [place]/AT, PERRL, EOM-I and MMM PULM: CTA bilaterally CV: RRR, Nl S1/S2 and -M/R/G. GI: Soft, NT, ND and +BS MSK: WNL Neuro: Awake and interactive, moving all ext to command Skin: Intact  LABS:  BMET Recent Labs  Lab 09/10/17 0235 09/11/17 0249 09/12/17 0251  NA 139 136 137  K 3.4* 4.1 3.0*  CL 95* 90* 91*  CO2 _1 BUN 20 24* 24*  CREATININE 0.65 0.74 0.92  GLUCOSE 152* 86 144*   Electrolytes Recent Labs  Lab 09/08/17 0413  09/10/17 0235 09/11/17 0249 09/12/17 0251  CALCIUM 8.9   < > 8.9 9.3 9.7  MG 1.8  --  1.5* 2.2  --   PHOS 2.2*  --  3.2 4.4  --    < > = values in this interval not displayed.    CBC Recent Labs  Lab 09/10/17 0235 09/11/17 0249 09/11/17 1022  WBC 15.0* 11.9* 14.1*  HGB 10.0* 8.4* 10.0*  HCT 33.0* 28.0* 32.5*  PLT 441* 554* 642*    Coag's Recent Labs  Lab 09/05/17 1513 09/08/17 0413 09/10/17 2012  INR 1.19 1.18 1.19     Sepsis Markers Recent Labs  Lab 09/06/17 0307 09/07/17 0426  PROCALCITON 3.36 2.16    ABG Recent Labs  Lab 09/08/17 1054 09/10/17 0538 09/11/17 0317  PHART 7.451* 7.481* 7.561*  PCO2ART 42.2 45.1 42.8  PO2ART 84.0 68.1* 69.3*    Liver Enzymes No results for input(s): AST, ALT, ALKPHOS, BILITOT, ALBUMIN in the last 168 hours.  Cardiac Enzymes No results for input(s): TROPONINI, PROBNP in the last 168 hours.  Glucose Recent Labs  Lab 09/11/17 1219 09/11/17 1617 09/11/17 1947 09/11/17 2331 09/12/17 0335 09/12/17 0839  GLUCAP 179* 87 113* 142* 161* 122*    Imaging No results found.   STUDIES:  TTE 3/3 >> EF 65 to 70%, grade 1 DD, mod AS,  LE Venous Duplex 3/3 >> negative  TEE 3/15 >> mod LVH, mild/mod MS, mod AS Bronch 3/16 > bronchitis, bloody secretions  CULTURES: BCx2 3/2 >> negative  Strep Pneumo Antigen 3/3 >> negative  RVP 3/3 >> influenza B positive  Tracheal Aspirate 3/5 >> negative  BCx2 3/7 >> negative BAL 3/8 >>negative Resp culture 3/17 >> normal flora   ANTIBIOTICS: Zosyn 3/2 >> 3/8 Vanco 3/2 >> 3/4>>>3/8>>>3/11>>>3/18>>>3/21 Meropenem 3/8  >> 3/21 Eraxis 3/19>>>  SIGNIFICANT EVENTS: 3/02  Admit with SOB, vomited on  BiPAP > intubated, ARDS  3/03  Placed on paralytics, requiring pressors 3/05  PRBC x1.  Paralytics stopped 3/07  Off pressors. 3/08  Bronch >> unexpectedly showed focal bleeding from right upper lobe, anterior segment 3/09 Reparalysed, proned 3/10 placed supine 3/11 NMB stopped.  3/13 worsening hypoxia, nimbex resumed 3/14 placing PA catheter and then dissipating transition to [facility] 3/15 TEE, nimbex d/c'ed >  3/17 Bronch: diffuse bronchitis, bloody secretions 3/20 extubate  LINES/TUBES: ETT 3/2 >> 3/14>>>3/15>>>3/20 L IJ TLC 3/3 >> Rt radial A line 3/9>> 3/16 Rt IJ PA catheter 3/14 >> 3/16  I reviewed CXR myself, diffuse opacification noted  DISCUSSION: 52 y/o female with influenza B causing ARDS,  course complicated by heart failure in setting of prior mitral valvular disease.   ASSESSMENT / PLAN:  PULMONARY A: ARDS> CXR much improved.  HCAP Hemoptysis> due to bronchitis. Resolved.  P:   Tolerated extubation well Monitor for airway protection PT IS Flutter valve Titrate O2 down to sat of 88-92% Hold further diureses Abx as below  CARDIOVASCULAR A:  Acute Diastolic heart failure Mod MS Mod AS Mild MR P:  D/C tele Hold further diureses F/u cardiology recommendations  RENAL A:   Hypokalemia P:   Replace K (IV until able to take PO) Monitor BMET and UOP Replace electrolytes as needed D/C lasix  GASTROINTESTINAL A:   Diarrhea - resolved.  P:   Speech eval - failed, barium swallow today  HEMATOLOGIC A:   Hemoptysis - resolved.  Anemia - no obvious source bleeding  P:  Monitor for bleeding Transfuse per ICU protocol  INFECTIOUS A:   Influenza B > treated L lung HCAP > on mero Fever> c diff? ARDS? Line? Improving.  P:   D/C meropenem D/C vanc Eraxis for a total of 7 days  ENDOCRINE A:   Hyperglycemia P:   SSI  Levemir  NEUROLOGIC A:   SEdation needs P:   D/C all sedation  FAMILY  - Updates: Patient updated bedside  - Inter-disciplinary family meet or Palliative Care meeting due by:  day 7  Discussed with PCCM-NP and TRH-MD, transfer to med-surg and to [facility] service with PCCM off 3/22.  Rush Farmer, M.D. [facility] Pulmonary/Critical Care Medicine. Pager: [phone]. After hours pager: [phone].

## 2017-09-12 NOTE — Progress Notes (Signed)
Report called receiving Rn [id number]. Will transfer in bed.

## 2017-09-12 NOTE — Progress Notes (Signed)
Received patient from [place], alert but confused, lines intact. VSS. Not in any distress, O2 at 4LPM. Bed alarm activated. Oriented to room and staff. Will endorse appropriately.

## 2017-09-12 NOTE — Plan of Care (Signed)
  Problem: Clinical Measurements: Goal: Ability to maintain clinical measurements within normal limits will improve Outcome: Progressing Goal: Will remain free from infection Outcome: Progressing Goal: Diagnostic test results will improve Outcome: Progressing Goal: Respiratory complications will improve Outcome: Progressing Goal: Cardiovascular complication will be avoided Outcome: Progressing   Problem: Coping: Goal: Level of anxiety will decrease Outcome: Progressing   Problem: Elimination: Goal: Will not experience complications related to bowel motility Outcome: Progressing Goal: Will not experience complications related to urinary retention Outcome: Progressing   Problem: Safety: Goal: Ability to remain free from injury will improve Outcome: Progressing   Problem: Skin Integrity: Goal: Risk for impaired skin integrity will decrease Outcome: Progressing   Problem: Coping: Goal: Ability to adjust to condition or change in health will improve Outcome: Progressing   Problem: Metabolic: Goal: Ability to maintain appropriate glucose levels will improve Outcome: Progressing   Problem: Nutritional: Goal: Progress toward achieving an optimal weight will improve Outcome: Progressing   Problem: Skin Integrity: Goal: Risk for impaired skin integrity will decrease Outcome: Progressing   Problem: Tissue Perfusion: Goal: Adequacy of tissue perfusion will improve Outcome: Progressing   Problem: Respiratory: Goal: Ability to maintain adequate ventilation will improve Outcome: Progressing Goal: Ability to maintain a clear airway will improve Outcome: Progressing   Problem: Nutrition: Goal: Adequate nutrition will be maintained Outcome: Not Progressing   Problem: Fluid Volume: Goal: Ability to maintain a balanced intake and output will improve Outcome: Not Progressing   Problem: Nutritional: Goal: Maintenance of adequate nutrition will improve Outcome: Not  Progressing   Problem: Clinical Measurements: Goal: Ability to maintain a body temperature in the normal range will improve Outcome: Completed/Met

## 2017-09-12 NOTE — Progress Notes (Signed)
Modified Barium Swallow Progress Note  Patient Details  Name: Holly Figueroa MRN: [id number] Date of Birth: [birth year]  Today's Date: 09/12/2017  Modified Barium Swallow completed.  Full report located under Chart Review in the Imaging Section.  Brief recommendations include the following:  Clinical Impression  Pt exhibited moderate oropharyngeal marked by decreased timing and weakness of laryngeal structures resulting in aspiration of thin, nectar and honey thick barium. Penetration occurred slightly prior to epiglottic inversion and during swallow with mildly delayed weak reflexive cough and chin tuck posture was not effective strategy. Decreased oral cohesion, manipulation and delayed transit with all consistencies. Pt began to gag with mild emesis during study. Unable to transit puree texture, SLP suctioned from oral cavity. She is presently not safe with any po's; prognosis is good for return to swallow given extended time post 3 week intubation. Recommend short term alternative nutrition.    Swallow Evaluation Recommendations       SLP Diet Recommendations: NPO       Medication Administration: Via alternative means               Oral Care Recommendations: Oral care QID        [name] 09/12/2017,1:08 PM   Orbie Pyo [place] [phone]

## 2017-09-13 DIAGNOSIS — Z452 Encounter for adjustment and management of vascular access device: Secondary | ICD-10-CM

## 2017-09-13 LAB — BASIC METABOLIC PANEL
ANION GAP: 19 — AB (ref 5–15)
BUN: 21 mg/dL — ABNORMAL HIGH (ref 6–20)
CALCIUM: 9.7 mg/dL (ref 8.9–10.3)
CO2: 21 mmol/L — ABNORMAL LOW (ref 22–32)
CREATININE: 0.85 mg/dL (ref 0.44–1.00)
Chloride: 97 mmol/L — ABNORMAL LOW (ref 101–111)
Glucose, Bld: 148 mg/dL — ABNORMAL HIGH (ref 65–99)
Potassium: 3.8 mmol/L (ref 3.5–5.1)
SODIUM: 137 mmol/L (ref 135–145)

## 2017-09-13 LAB — CBC
HCT: 33.6 % — ABNORMAL LOW (ref 36.0–46.0)
Hemoglobin: 10.1 g/dL — ABNORMAL LOW (ref 12.0–15.0)
MCH: 26.8 pg (ref 26.0–34.0)
MCHC: 30.1 g/dL (ref 30.0–36.0)
MCV: 89.1 fL (ref 78.0–100.0)
Platelets: 843 10*3/uL — ABNORMAL HIGH (ref 150–400)
RBC: 3.77 MIL/uL — ABNORMAL LOW (ref 3.87–5.11)
RDW: 16.7 % — ABNORMAL HIGH (ref 11.5–15.5)
WBC: 16.1 10*3/uL — AB (ref 4.0–10.5)

## 2017-09-13 LAB — GLUCOSE, CAPILLARY
GLUCOSE-CAPILLARY: 128 mg/dL — AB (ref 65–99)
GLUCOSE-CAPILLARY: 181 mg/dL — AB (ref 65–99)
Glucose-Capillary: 130 mg/dL — ABNORMAL HIGH (ref 65–99)
Glucose-Capillary: 129 mg/dL — ABNORMAL HIGH (ref 65–99)
Glucose-Capillary: 181 mg/dL — ABNORMAL HIGH (ref 65–99)
Glucose-Capillary: 163 mg/dL — ABNORMAL HIGH (ref 65–99)
Glucose-Capillary: 143 mg/dL — ABNORMAL HIGH (ref 65–99)

## 2017-09-13 LAB — PHOSPHORUS: PHOSPHORUS: 3.1 mg/dL (ref 2.5–4.6)

## 2017-09-13 LAB — MAGNESIUM: MAGNESIUM: 2.1 mg/dL (ref 1.7–2.4)

## 2017-09-13 MED ORDER — ENOXAPARIN SODIUM 40 MG/0.4ML ~~LOC~~ SOLN
40.0000 mg | SUBCUTANEOUS | Status: DC
  Administered 2017-09-13 – 2017-09-17 (×5): 40 mg via SUBCUTANEOUS
  Filled 2017-09-13 (×5): qty 0.4

## 2017-09-13 MED ORDER — PRO-STAT SUGAR FREE PO LIQD
30.0000 mL | Freq: Every day | ORAL | Status: DC
  Administered 2017-09-13 – 2017-09-17 (×5): 30 mL
  Filled 2017-09-13 (×5): qty 30

## 2017-09-13 MED ORDER — FREE WATER
30.0000 mL | Freq: Three times a day (TID) | Status: DC
  Administered 2017-09-13: 60 mL
  Administered 2017-09-14: 30 mL
  Administered 2017-09-14 – 2017-09-15 (×3): 60 mL
  Administered 2017-09-15 – 2017-09-16 (×3): 30 mL
  Administered 2017-09-16 – 2017-09-17 (×3): 60 mL

## 2017-09-13 MED ORDER — GLUCERNA 1.2 CAL PO LIQD
1000.0000 mL | ORAL | Status: DC
  Administered 2017-09-13 – 2017-09-17 (×6): 1000 mL
  Filled 2017-09-13 (×10): qty 1000

## 2017-09-13 NOTE — Consult Note (Signed)
Physical Medicine and Rehabilitation Consult Reason for Consult: Decreased functional mobility Referring Physician: Internal medicine   HPI: Holly Figueroa is a 52 y.o. 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  Per chart review patient lives with husband in [place].  Independent prior to admission.  Husband works during the day.  Limited family assistance.  Patient recently discharged from [facility] on 08/21/2017 after treatment for multifocal pneumonia.  She was discharged on Augmentin.  Presented 08/24/2017 with increasing shortness of breath, low-grade fever, hypoxia, WBC 17,000.  Troponin negative.  Chest x-ray with bilateral infiltrates pulmonary edema.  Echocardiogram with ejection fraction of 87% grade 1 diastolic dysfunction.  Influenza B positive completed course of Tamiflu.  TEE showed no thrombus, rheumatic MV with moderate MS.  Patient required extended intubation through 09/12/2017.  Currently n.p.o. with plan follow-up speech therapy.  Subcutaneous Lovenox for DVT prophylaxis.  Physical therapy evaluation completed 09/13/2017 with recommendations of physical medicine rehab consult.   Review of Systems  Constitutional: Positive for fever and malaise/fatigue.  HENT: Negative for hearing loss.   Eyes: Negative for blurred vision and double vision.  Respiratory: Positive for cough and shortness of breath.   Cardiovascular: Positive for leg swelling. Negative for chest pain and palpitations.  Gastrointestinal: Positive for constipation. Negative for nausea and vomiting.  Genitourinary: Negative for dysuria, flank pain and hematuria.  Musculoskeletal: Positive for myalgias.  Neurological: Positive for headaches. Negative for seizures.  Psychiatric/Behavioral: Positive for depression.         Anxiety with panic attacks  All other systems reviewed and are negative.  Past Medical History:  Diagnosis Date  . Acute respiratory failure ([place])   . Anemia 08-2008   Blood transfusion  . Anxiety   . Benzodiazepine dependence ([place])   . Cervical cancer ([place])   . CHF (congestive heart failure) ([place])   . Depression   . Diabetes mellitus without complication ([place])   . Headache(784.0)    migraines  . Hypokalemia   . Legionella pneumonia ([place])   . Leukocytosis, unspecified   . Mitral stenosis   . Panic attacks   . Tobacco abuse    Past Surgical History:  Procedure Laterality Date  . BIOPSY  06/12/2017   Procedure: BIOPSY;  Surgeon: Daneil Dolin, MD;  [facility];  Service: Gastroenterology;;  esophagus  . cardiac valve repair  2011   stretched mitral valve  . CERVICAL CONIZATION W/BX  2000  . CHOLECYSTECTOMY  06/26/2012   Procedure: LAPAROSCOPIC CHOLECYSTECTOMY WITH INTRAOPERATIVE CHOLANGIOGRAM;  Surgeon: Ralene Ok, MD;  Location: [facility];  Service: General;  Laterality: N/A;  . COLONOSCOPY WITH PROPOFOL N/A 06/12/2017   Normal colon, normal TI, Grade I internal hemorrhoids  . ESOPHAGOGASTRODUODENOSCOPY (EGD) WITH PROPOFOL N/A 06/12/2017   esophagitis, possibly chemical or pill-induced, s/p dilation. small hiatal hernia, normal duodenal bulb and D 2  . GIVENS CAPSULE STUDY N/A 07/19/2017   incomplete capsule study as did not reach cecum.   Marland Kitchen LASER ABLATION OF THE CERVIX    . MALONEY DILATION  06/12/2017   Procedure: MALONEY DILATION;  Surgeon: Daneil Dolin, MD;  [facility];  Service: Gastroenterology;;  . MOLE REMOVAL    . NASAL SINUS SURGERY     Family History  Problem Relation Age of Onset  . Heart disease Father   .  COPD Father   . Heart failure Maternal Grandmother   . Stroke Mother   . Colon cancer Brother 3       deceased   . Colon polyps Neg Hx    Social History:  reports that she has been smoking cigarettes.  She has a  23.00 pack-year smoking history. She has never used smokeless tobacco. She reports that she does not drink alcohol or use drugs. Allergies:  Allergies  Allergen Reactions  . Iodinated Diagnostic Agents Anaphylaxis  . Aspirin Other (See Comments)    "Possible blood in stool" the 341m. Can take 845m  . Ibuprofen Other (See Comments)    "Possible blood in stool"  . Sulfa Antibiotics Hives  . Zofran [Ondansetron Hcl]     Bad headaches  . Dilaudid [Hydromorphone Hcl] Itching and Palpitations  . Hydrocodone Palpitations   Medications Prior to Admission  Medication Sig Dispense Refill  . albuterol (PROVENTIL HFA;VENTOLIN HFA) 108 (90 Base) MCG/ACT inhaler Inhale 1-2 puffs into the lungs every 6 (six) hours as needed for wheezing or shortness of breath (with Aerochamber).    . ALPRAZolam (XANAX) 1 MG tablet Take 0.5 tablets (0.5 mg total) by mouth 3 (three) times daily. anxiety (Patient taking differently: Take 1 mg by mouth 3 (three) times daily. anxiety) 30 tablet 0  . aspirin EC 81 MG tablet Take 81 mg by mouth daily after breakfast.     . Cholecalciferol (VITAMIN D-3) 5000 units TABS Take 5,000 Units by mouth daily after breakfast.     . Cyanocobalamin (VITAMIN B-12 PO) Take 1 tablet by mouth daily after breakfast.     . FLUoxetine (PROZAC) 40 MG capsule Take 80 mg by mouth daily after breakfast.     . furosemide (LASIX) 20 MG tablet Take 20 mg by mouth daily as needed for fluid.    . hydrocortisone (ANUSOL-HC) 25 MG suppository Place 1 suppository (25 mg total) rectally 4 (four) times daily -  with meals and at bedtime. (Patient taking differently: Place 25 mg rectally 2 (two) times daily as needed for hemorrhoids or anal itching. ) 12 suppository 1  . metFORMIN (GLUCOPHAGE) 500 MG tablet Take 1 tablet (500 mg total) by mouth 2 (two) times daily with a meal. (Patient taking differently: Take 1,000 mg by mouth 2 (two) times daily with a meal. ) 60 tablet 0  . pantoprazole (PROTONIX) 40 MG  tablet Take 40 mg by mouth daily after breakfast.     . potassium chloride SA (K-DUR,KLOR-CON) 20 MEQ tablet Take 1 tablet by mouth daily as needed (when taken with Lasix for fluid).     . promethazine (PHENERGAN) 12.5 MG tablet Take 1 tablet (12.5 mg total) by mouth every 6 (six) hours as needed for nausea or vomiting. 15 tablet 0  . rosuvastatin (CRESTOR) 10 MG tablet Take 10 mg by mouth daily after breakfast.     . Spacer/Aero-Holding Chambers (AEROCHAMBER PLUS FLO-VU MEDIUM) MISC 1 each by Other route every 6 (six) hours as needed (sob,wheezing).     . traZODone (DESYREL) 50 MG tablet Take 100 mg by mouth at bedtime.     . vitamin C (ASCORBIC ACID) 500 MG tablet Take 500 mg by mouth daily after breakfast.     . [EXPIRED] amoxicillin-clavulanate (AUGMENTIN) 875-125 MG tablet Take 1 tablet by mouth 2 (two) times daily for 7 days. (Patient not taking: Reported on 08/24/2017) 14 tablet 0    Home: Home Living Family/patient expects to be discharged to:: Unsure Living Arrangements:  Spouse/significant other  Functional History: Prior Function Level of Independence: Independent Comments: Per MD note, patient independent with mobility at baseline Functional Status:  Mobility: Bed Mobility Overal bed mobility: Needs Assistance Bed Mobility: Supine to Sit Supine to sit: Max assist, +2 for physical assistance General bed mobility comments: Supine to sit with maximal assistance + 2 at BLE and trunk.  Transfers Overall transfer level: Needs assistance Equipment used: 2 person hand held assist Transfers: Sit to/from Stand, Stand Pivot Transfers Sit to Stand: +2 physical assistance, Min assist Stand pivot transfers: Mod assist, +2 physical assistance General transfer comment: Patient requiring min assist + 2 for sit to stand with knee block but no knee buckle noted. Stand pivot transfer with modA +2 from bed to chair. Patient very unsteady.       ADL:    Cognition: Cognition Overall  Cognitive Status: No family/caregiver present to determine baseline cognitive functioning Orientation Level: Disoriented to time, Oriented to person, Disoriented to place, Disoriented to situation Cognition Arousal/Alertness: Lethargic Behavior During Therapy: Flat affect Overall Cognitive Status: No family/caregiver present to determine baseline cognitive functioning General Comments: Patient's eyes closed through majority of session. Follows simple commands inconsistently.   Blood pressure (!) 132/58, pulse (!) 102, temperature 98.4 F (36.9 C), temperature source Oral, resp. rate 19, height _0  (1.676 m), weight 74 kg (163 lb 2.3 oz), SpO2 92 %. Physical Exam  Vitals reviewed. HENT:  Head: Normocephalic.  Eyes: EOM are normal.  Neck: Normal range of motion. No thyromegaly present.  Cardiovascular: Normal rate and regular rhythm.  Respiratory: Effort normal and breath sounds normal.  GI: Soft. Bowel sounds are normal. She exhibits no distension.  Neurological:  Patient makes eye contact with examiner.  She did follow simple commands.  Her voice is very soft.  She provides her name and age.  Limited medical historian  Skin: Skin is warm and dry.    Results for orders placed or performed during the hospital encounter of 08/24/17 (from the past 24 hour(s))  Glucose, capillary     Status: Abnormal   Collection Time: 09/12/17  4:09 PM  Result Value Ref Range   Glucose-Capillary 116 (H) 65 - 99 mg/dL   Comment 1 Capillary Specimen   Glucose, capillary     Status: Abnormal   Collection Time: 09/12/17  8:23 PM  Result Value Ref Range   Glucose-Capillary 110 (H) 65 - 99 mg/dL  Glucose, capillary     Status: Abnormal   Collection Time: 09/13/17 12:31 AM  Result Value Ref Range   Glucose-Capillary 128 (H) 65 - 99 mg/dL  Glucose, capillary     Status: Abnormal   Collection Time: 09/13/17  4:07 AM  Result Value Ref Range   Glucose-Capillary 130 (H) 65 - 99 mg/dL  CBC     Status:  Abnormal   Collection Time: 09/13/17  5:02 AM  Result Value Ref Range   WBC 16.1 (H) 4.0 - 10.5 K/uL   RBC 3.77 (L) 3.87 - 5.11 MIL/uL   Hemoglobin 10.1 (L) 12.0 - 15.0 g/dL   HCT 33.6 (L) 36.0 - 46.0 %   MCV 89.1 78.0 - 100.0 fL   MCH 26.8 26.0 - 34.0 pg   MCHC 30.1 30.0 - 36.0 g/dL   RDW 16.7 (H) 11.5 - 15.5 %   Platelets 843 (H) 150 - 400 K/uL  Basic metabolic panel     Status: Abnormal   Collection Time: 09/13/17  5:02 AM  Result Value Ref Range  Sodium 137 135 - 145 mmol/L   Potassium 3.8 3.5 - 5.1 mmol/L   Chloride 97 (L) 101 - 111 mmol/L   CO2 21 (L) 22 - 32 mmol/L   Glucose, Bld 148 (H) 65 - 99 mg/dL   BUN 21 (H) 6 - 20 mg/dL   Creatinine, Ser 0.85 0.44 - 1.00 mg/dL   Calcium 9.7 8.9 - 10.3 mg/dL   GFR calc non Af Amer >60 >60 mL/min   GFR calc Af Amer >60 >60 mL/min   Anion gap 19 (H) 5 - 15  Magnesium     Status: None   Collection Time: 09/13/17  5:02 AM  Result Value Ref Range   Magnesium 2.1 1.7 - 2.4 mg/dL  Phosphorus     Status: None   Collection Time: 09/13/17  5:02 AM  Result Value Ref Range   Phosphorus 3.1 2.5 - 4.6 mg/dL  Glucose, capillary     Status: Abnormal   Collection Time: 09/13/17  7:58 AM  Result Value Ref Range   Glucose-Capillary 129 (H) 65 - 99 mg/dL  Glucose, capillary     Status: Abnormal   Collection Time: 09/13/17 11:54 AM  Result Value Ref Range   Glucose-Capillary 181 (H) 65 - 99 mg/dL   Dg Chest Portable 1 View  Result Date: 09/12/2017 CLINICAL DATA:  ARDS EXAM: PORTABLE CHEST 1 VIEW COMPARISON:  09/11/2017 FINDINGS: Endotracheal tube and NG tube removed. Diffuse bilateral airspace disease unchanged. No pneumothorax or effusion. IMPRESSION: Endotracheal tube and NG tube removed Diffuse bilateral airspace disease unchanged. Electronically Signed   By: Franchot Gallo M.D.   On: 09/12/2017 11:18   Dg Swallowing Func-speech Pathology  Result Date: 09/12/2017 Objective Swallowing Evaluation: Type of Study: MBS-Modified Barium Swallow  Study  Patient Details Name: Holly Figueroa MRN: [id number] Date of Birth: Jul 19, 1965 Today's Date: 09/12/2017 Time: SLP Start Time (ACUTE ONLY): 1000 -SLP Stop Time (ACUTE ONLY): 1015 SLP Time Calculation (min) (ACUTE ONLY): 15 min Past Medical History: Past Medical History: Diagnosis Date . Acute respiratory failure ([place])  . Anemia 08-2008  Blood transfusion . Anxiety  . Benzodiazepine dependence ([place])  . Cervical cancer ([place])  . CHF (congestive heart failure) ([place])  . Depression  . Diabetes mellitus without complication ([place])  . Headache(784.0)   migraines . Hypokalemia  . Legionella pneumonia ([place])  . Leukocytosis, unspecified  . Mitral stenosis  . Panic attacks  . Tobacco abuse  Past Surgical History: Past Surgical History: Procedure Laterality Date . BIOPSY  06/12/2017  Procedure: BIOPSY;  Surgeon: Daneil Dolin, MD;  [facility];  Service: Gastroenterology;;  esophagus . cardiac valve repair  2011  stretched mitral valve . CERVICAL CONIZATION W/BX  2000 . CHOLECYSTECTOMY  06/26/2012  Procedure: LAPAROSCOPIC CHOLECYSTECTOMY WITH INTRAOPERATIVE CHOLANGIOGRAM;  Surgeon: Ralene Ok, MD;  Location: [facility];  Service: General;  Laterality: N/A; . COLONOSCOPY WITH PROPOFOL N/A 06/12/2017  Normal colon, normal TI, Grade I internal hemorrhoids . ESOPHAGOGASTRODUODENOSCOPY (EGD) WITH PROPOFOL N/A 06/12/2017  esophagitis, possibly chemical or pill-induced, s/p dilation. small hiatal hernia, normal duodenal bulb and D 2 . GIVENS CAPSULE STUDY N/A 07/19/2017  incomplete capsule study as did not reach cecum.  Marland Kitchen LASER ABLATION OF THE CERVIX   . MALONEY DILATION  06/12/2017  Procedure: MALONEY DILATION;  Surgeon: Daneil Dolin, MD;  [facility];  Service: Gastroenterology;; . MOLE REMOVAL   . NASAL SINUS SURGERY   HPI: Holly Malhotra Aldersonis a 52 y.o.femalewith medical history significant ofmitral stenosis,  DM type II, CHF last EF 65-70% with grade 1 diastolic dysfunction, mulltifocal PNA  07/2017 presents with worsening dyspnea. Intubated 3/2-3/21. CXR with bilateral infiltrates read as pulm edema. BSE 10/2014 with acute reversible dysphagia after intubation and recommended full liquid diet. MBS recommended following BSE.  No data recorded Assessment / Plan / Recommendation CHL IP CLINICAL IMPRESSIONS 09/12/2017 Clinical Impression Pt exhibited moderate oropharyngeal marked by decreased timing and weakness of laryngeal structures resulting in aspiration of thin, nectar and honey thick barium. Penetration occurred slightly prior to epiglottic inversion and during swallow with mildly delayed weak reflexive cough and chin tuck posture was not effective strategy. Decreased oral cohesion, manipulation and delayed transit with all consistencies. Pt began to gag with mild emesis during study. Unable to transit puree texture, SLP suctioned from oral cavity. She is presently not safe with any po's; prognosis is good for return to swallow given extended time post 3 week intubation. Recommend short term alternative nutrition.  SLP Visit Diagnosis Dysphagia, oropharyngeal phase (R13.12) Attention and concentration deficit following -- Frontal lobe and executive function deficit following -- Impact on safety and function Moderate aspiration risk;Severe aspiration risk   CHL IP TREATMENT RECOMMENDATION 09/12/2017 Treatment Recommendations Therapy as outlined in treatment plan below   Prognosis 09/12/2017 Prognosis for Safe Diet Advancement Good Barriers to Reach Goals -- Barriers/Prognosis Comment -- CHL IP DIET RECOMMENDATION 09/12/2017 SLP Diet Recommendations NPO Liquid Administration via -- Medication Administration Via alternative means Compensations -- Postural Changes --   CHL IP OTHER RECOMMENDATIONS 09/12/2017 Recommended Consults -- Oral Care Recommendations Oral care QID Other Recommendations --   CHL IP FOLLOW UP RECOMMENDATIONS 09/12/2017 Follow up Recommendations 24 hour supervision/assistance   CHL IP  FREQUENCY AND DURATION 09/12/2017 Speech Therapy Frequency (ACUTE ONLY) min 2x/week Treatment Duration 2 weeks      CHL IP ORAL PHASE 09/12/2017 Oral Phase Impaired Oral - Pudding Teaspoon -- Oral - Pudding Cup -- Oral - Honey Teaspoon Decreased bolus cohesion;Delayed oral transit;Holding of bolus;Reduced posterior propulsion;Weak lingual manipulation Oral - Honey Cup NT Oral - Nectar Teaspoon Decreased bolus cohesion;Delayed oral transit;Holding of bolus;Reduced posterior propulsion;Weak lingual manipulation Oral - Nectar Cup NT Oral - Nectar Straw -- Oral - Thin Teaspoon -- Oral - Thin Cup Decreased bolus cohesion;Delayed oral transit;Holding of bolus;Reduced posterior propulsion;Weak lingual manipulation Oral - Thin Straw -- Oral - Puree Decreased bolus cohesion;Delayed oral transit;Holding of bolus;Reduced posterior propulsion;Weak lingual manipulation Oral - Mech Soft NT Oral - Regular NT Oral - Multi-Consistency NT Oral - Pill NT Oral Phase - Comment --  CHL IP PHARYNGEAL PHASE 09/12/2017 Pharyngeal Phase Impaired Pharyngeal- Pudding Teaspoon -- Pharyngeal -- Pharyngeal- Pudding Cup -- Pharyngeal -- Pharyngeal- Honey Teaspoon Penetration/Aspiration during swallow;Reduced laryngeal elevation;Reduced airway/laryngeal closure;Reduced epiglottic inversion Pharyngeal Material enters airway, passes BELOW cords and not ejected out despite cough attempt by patient Pharyngeal- Honey Cup NT Pharyngeal -- Pharyngeal- Nectar Teaspoon Penetration/Aspiration during swallow;Reduced epiglottic inversion;Reduced laryngeal elevation Pharyngeal Material enters airway, passes BELOW cords and not ejected out despite cough attempt by patient Pharyngeal- Nectar Cup NT Pharyngeal -- Pharyngeal- Nectar Straw -- Pharyngeal -- Pharyngeal- Thin Teaspoon -- Pharyngeal -- Pharyngeal- Thin Cup Penetration/Aspiration before swallow;Penetration/Aspiration during swallow;Reduced airway/laryngeal closure Pharyngeal Material enters airway, passes  BELOW cords and not ejected out despite cough attempt by patient Pharyngeal- Thin Straw -- Pharyngeal -- Pharyngeal- Puree (No Data) Pharyngeal -- Pharyngeal- Mechanical Soft -- Pharyngeal -- Pharyngeal- Regular -- Pharyngeal -- Pharyngeal- Multi-consistency -- Pharyngeal -- Pharyngeal- Pill -- Pharyngeal -- Pharyngeal Comment --  CHL IP CERVICAL ESOPHAGEAL PHASE 09/12/2017 Cervical Esophageal Phase WFL Pudding Teaspoon -- Pudding Cup -- Honey Teaspoon -- Honey Cup -- Nectar Teaspoon -- Nectar Cup -- Nectar Straw -- Thin Teaspoon -- Thin Cup -- Thin Straw -- Puree -- Mechanical Soft -- Regular -- Multi-consistency -- Pill -- Cervical Esophageal Comment -- No flowsheet data found. Holly Figueroa 09/12/2017, 1:08 PM               Assessment/Plan: Diagnosis: 52 yo female with debility related to respiratory failure, influenza B and multiple medical  1. Does the need for close, 24 hr/day medical supervision in concert with the patient's rehab needs make it unreasonable for this patient to be served in a less intensive setting? Yes 2. Co-Morbidities requiring supervision/potential complications: rheumatic heart disease, anemia, CHF 3. Due to bladder management, bowel management, safety, skin/wound care, disease management, medication administration, pain management and patient education, does the patient require 24 hr/day rehab nursing? Yes 4. Does the patient require coordinated care of a physician, rehab nurse, PT (1-2 hrs/day, 5 days/week), OT (1-2 hrs/day, 5 days/week) and SLP (1-2 hrs/day, 5 days/week) to address physical and functional deficits in the context of the above medical diagnosis(es)? Yes Addressing deficits in the following areas: balance, endurance, locomotion, strength, transferring, bowel/bladder control, bathing, dressing, feeding, grooming, toileting, swallowing and psychosocial support 5. Can the patient actively participate in an intensive therapy program of at least 3 hrs of therapy  per day at least 5 days per week? Yes 6. The potential for patient to make measurable gains while on inpatient rehab is excellent 7. Anticipated functional outcomes upon discharge from inpatient rehab are modified independent and supervision  with PT, modified independent and supervision with OT, modified independent and supervision with SLP. 8. Estimated rehab length of stay to reach the above functional goals is: 14-18 days 9. Anticipated D/C setting: Home 10. Anticipated post D/C treatments: [place] therapy 11. Overall Rehab/Functional Prognosis: excellent  RECOMMENDATIONS: This patient's condition is appropriate for continued rehabilitative care in the following setting: CIR Patient has agreed to participate in recommended program. Yes Note that insurance prior authorization may be required for reimbursement for recommended care.  Comment: Rehab Admissions Coordinator to follow up.  Thanks,  Holly Staggers, MD, Holly Figueroa    Holly Paganini Angiulli, PA-C 09/13/2017

## 2017-09-13 NOTE — Progress Notes (Signed)
Cortrak Tube Team Note:  Consult received to place a Cortrak feeding tube.   A 10 F Cortrak tube was placed in the R nare and secured with a nasal bridle at 74 cm. Per the Cortrak monitor reading the tube tip is gastric, right at the pylorus.   No x-ray is required. RN may begin using tube.    If the tube becomes dislodged please keep the tube and contact the Cortrak team at [email or web address] (password [email or web address]) for replacement.  If after hours and replacement cannot be delayed, place a NG tube and confirm placement with an abdominal x-ray.      Jarome Matin, MS, RD, LDN, [facility] Inpatient Clinical Dietitian Pager # [phone] After hours/weekend pager # [phone]

## 2017-09-13 NOTE — Progress Notes (Signed)
I will follow up with pt and family on Monday to begin discussions on a possible inpt rehab admit. I will order an OT consult which will be needed for [facility] authorization. I will begin insurance authorization on Monday. [phone]

## 2017-09-13 NOTE — Progress Notes (Signed)
Nutrition Follow-up  DOCUMENTATION CODES:   Obesity unspecified  INTERVENTION:  Will order 30 mL Prostat once/day with Glucerna 1.2 @ 40 mL/hr to advance by 10 mL every 4 hours to reach goal rate of Glucerna 1.2 @ 70 mL/hr. At goal rate this regimen will provide 2116 kcal, 116 grams of protein, and 1352 mL free water.   NUTRITION DIAGNOSIS:   Inadequate oral intake related to inability to eat as evidenced by NPO status. -ongoing  GOAL:   Patient will meet greater than or equal to 90% of their needs -unmet at this time.  MONITOR:   TF tolerance, Diet advancement, Weight trends, Labs  REASON FOR ASSESSMENT:   Consult Enteral/tube feeding initiation and management  ASSESSMENT:   52 y.o. female with medical history significant of mitral stenosis, DM type II, CHF last EF 65-70% with grade 1 diastolic dysfunction  Weight continues to trend down. Currently -17 lbs/7.8 kg since admission. Re-estimated nutrition needs as pt is now extubated; estimated with current weight (74 kg). Cortrak tube now in place; will order TF as outlined above. Pt currently receiving Vital 1.2 @ 55 mL/hr.   Medications reviewed; sliding scale Novolog, 4 units Novolog every 4 hours, 18 units Lantus/day, 1 tablet Prosight/day. Labs reviewed; CBGs: 128-181 mg/dL today, Cl: 97 mmol/L, BUN: 21 mg/dL.      Diet Order:  Diet NPO time specified  EDUCATION NEEDS:   No education needs have been identified at this time  Skin:  Skin Assessment: (deep tissue injury coccyx 3 cm x 2.6 cm )  Last BM:  3/21  Height:   Ht Readings from Last 1 Encounters:  08/28/17 _0  (1.676 m)    Weight:   Wt Readings from Last 1 Encounters:  09/12/17 163 lb 2.3 oz (74 kg)    Ideal Body Weight:  59.1 kg  BMI:  Body mass index is 26.33 kg/m.  Estimated Nutritional Needs:   Kcal:  2070-2220  Protein:  105-120 grams  Fluid:  >/= 1.8 L/day       Jarome Matin, MS, RD, LDN, [facility] Inpatient Clinical  Dietitian Pager # [phone] After hours/weekend pager # [phone]

## 2017-09-13 NOTE — Evaluation (Addendum)
Physical Therapy Evaluation Patient Details Name: Holly Figueroa MRN: [id number] DOB: May 06, 1966 Today's Date: 09/13/2017   History of Present Illness  Pt. is a 52 y.o. F with significant PMH of mitral stenosis, DM type II, CHF, who was admitted with ARDS from influenza B pneumonia complicated by pulmonary edema and prolonged respiratory failure. Patient was intubated 3/2-3/21.     Clinical Impression  Pt admitted with above diagnosis. Pt currently with functional limitations due to the deficits listed below (see PT Problem List). Patient very pleasant and agreeable to work with PT. Patient presents with significantly decreased functional mobility compared to baseline secondary to extended intubation. Patient main deficits include diminished strength and balance. Patient would be an excellent candidate for CIR based on her motivation, activity tolerance, age, and PLOF in order to maximize functional independence.  Pt will benefit from skilled PT to increase their independence and safety with mobility to allow discharge to the venue listed below.       Follow Up Recommendations CIR    Equipment Recommendations  Other (comment)(Defer to CIR)    Recommendations for Other Services OT consult     Precautions / Restrictions Precautions Precautions: Fall Restrictions Weight Bearing Restrictions: No      Mobility  Bed Mobility Overal bed mobility: Needs Assistance Bed Mobility: Supine to Sit     Supine to sit: Max assist;+2 for physical assistance     General bed mobility comments: Supine to sit with maximal assistance + 2 at BLE and trunk.   Transfers Overall transfer level: Needs assistance Equipment used: 2 person hand held assist Transfers: Sit to/from [facility] Sit to Stand: +2 physical assistance;Min assist Stand pivot transfers: Mod assist;+2 physical assistance       General transfer comment: Patient requiring min assist + 2 for sit to stand with  knee block but no knee buckle noted. Stand pivot transfer with modA +2 from bed to chair. Patient very unsteady.   Ambulation/Gait                Stairs            Wheelchair Mobility    Modified Rankin (Stroke Patients Only)       Balance Overall balance assessment: Needs assistance Sitting-balance support: Bilateral upper extremity supported;Feet supported Sitting balance-Leahy Scale: Fair     Standing balance support: No upper extremity supported Standing balance-Leahy Scale: Poor                               Pertinent Vitals/Pain Pain Assessment: Faces Faces Pain Scale: Hurts little more Pain Intervention(s): Monitored during session    Home Living Family/patient expects to be discharged to:: Unsure Living Arrangements: Spouse/significant other                    Prior Function Level of Independence: Independent         Comments: Per MD note, patient independent with mobility at baseline     Hand Dominance        Extremity/Trunk Assessment   Upper Extremity Assessment Upper Extremity Assessment: Generalized weakness    Lower Extremity Assessment Lower Extremity Assessment: Generalized weakness       Communication   Communication: Other (comment)(Low vocal intensity; soft spoken)  Cognition Arousal/Alertness: Lethargic Behavior During Therapy: Flat affect Overall Cognitive Status: No family/caregiver present to determine baseline cognitive functioning  General Comments: Patient's eyes closed through majority of session. Follows simple commands inconsistently.       General Comments  Pressure injury on coccyx.     Exercises     Assessment/Plan    PT Assessment Patient needs continued PT services  PT Problem List Decreased strength;Decreased range of motion;Decreased activity tolerance;Decreased balance;Decreased mobility;Decreased coordination       PT  Treatment Interventions Gait training;DME instruction;Functional mobility training;Therapeutic activities;Therapeutic exercise;Balance training;Patient/family education    PT Goals (Current goals can be found in the Care Plan section)  Acute Rehab PT Goals Patient Stated Goal: None stated    Frequency Min 3X/week   Barriers to discharge        Co-evaluation               AM-PAC PT "6 Clicks" Daily Activity  Outcome Measure Difficulty turning over in bed (including adjusting bedclothes, sheets and blankets)?: Unable Difficulty moving from lying on back to sitting on the side of the bed? : Unable Difficulty sitting down on and standing up from a chair with arms (e.g., wheelchair, bedside commode, etc,.)?: Unable Help needed moving to and from a bed to chair (including a wheelchair)?: A Lot Help needed walking in hospital room?: A Lot Help needed climbing 3-5 steps with a railing? : Total 6 Click Score: 8    End of Session Equipment Utilized During Treatment: Gait belt Activity Tolerance: Patient tolerated treatment well Patient left: in chair;with call bell/phone within reach;with chair alarm set;with family/visitor present Nurse Communication: Mobility status PT Visit Diagnosis: Unsteadiness on feet (R26.81);Muscle weakness (generalized) (M62.81);Difficulty in walking, not elsewhere classified (R26.2)    Time: 0932-1000 PT Time Calculation (min) (ACUTE ONLY): 28 min   Charges:   PT Evaluation $PT Eval High Complexity: 1 High PT Treatments $Therapeutic Activity: 8-22 mins   PT G Codes:       Ellamae Sia, PT, DPT Acute Rehabilitation Services  Pager: [place] 09/13/2017, 11:00 AM

## 2017-09-13 NOTE — Progress Notes (Signed)
Inpatient Rehabilitation  Per PT request, patient was screened by Gunnar Fusi for appropriateness for an Inpatient Acute Rehab consult.  At this time we are recommending an Inpatient Rehab consult as well an an OT eval and treat order.  Text paged MD to notify; please order if you are agreeable.  Carmelia Roller., CCC/SLP Admission Coordinator  [place]  Cell [phone]

## 2017-09-13 NOTE — Progress Notes (Addendum)
PROGRESS NOTE    Holly Figueroa  ENI:[id number] DOB: 11/03/1965 DOA: 08/24/2017 PCP: Keane Police, MD  /02 Admit with SOB, vomited on BiPAP >intubated, ARDS  3/03 Placed on paralytics, requiring pressors 3/05 PRBC x1. Paralytics stopped 3/07 Off pressors. 3/08 Bronch >>unexpectedly showed focal bleeding from right upper lobe, anterior segment 3/09 Reparalysed, proned 3/10 placed supine 3/11 NMB stopped.  3/13 worsening hypoxia, nimbex resumed 3/14 placing PA catheter and then dissipating transition to [facility] 3/15 TEE, nimbex d/c'ed >  3/17 Bronch: diffuse bronchitis, bloody secretions 3/20 extubated   Brief Narrative: PCCM transferred to [facility] 3/22 Briefly this is a 52 year old female with history of severe rheumatic mitral stenosis, remote mitral valvotomy 2012 at [place] admitted to the critical care service on 3/2 with severe hypoxemia, ARDS from influenza B pneumonia complicated by prolonged respiratory failure, was intubated for almost 19 days, finally extubated 3/20 -hospitalization complicated by healthcare associated pneumonia, BAL was positive for Candida -Finally improving, transferred to [facility] into the floor 3/22 -Failed swallow evaluation, recommended to have short-term alternate method of nutrition    Assessment & Plan:   Active Problems:   Acute respiratory failure (HCC) -complicated hospitalization with prolonged respiratory failure requiring mechanical ventilation -Intubated 3/2 and extubated 3/20 -Wean off O2 -Influenza B positive, completed course of Tamiflu -Blood cultures negative, strep pneumo antigen was negative tracheal aspirate negative -BAL on 3/8 with Candida tropicalis and glabrata -continue anidulafungin, day 4 of 7  Dysphagia -Suspected to be secondary to prolonged mechanical ventilation -Expect this to improve, SLP evaluation and modified barium swallow from 3/21 noted -Cortrak tube to be replaced today and then restart tube feeds and  medications per tube  Severe sepsis -Resolved  Type 2 diabetes mellitus -metformin on hold, Continue current dose of Lantus, sliding scale insulin for now  Chronic diastolic CHF -clinically euvolemic at this time, 9L negative -Monitor urine output and B met daily -stopped IV fluids  Rheumatic valcular dz/Moderate mitral stenosis and aortic stenosis -Per TEE 3/15 -Appreciate cards input, hold diuretics at this time -Assess need for diuretics on a daily basis, continue metoprolol -Follow-up with cardiology  DVT prophylaxis:add Lovenox Code Status: full code Family Communication:no family at bedside Disposition Plan: may need CIR when swallowing improved  Consultants:   PCCM  Cards   Procedures:   Antimicrobials:    Subjective: -Feels okay, no specific complaints, unable to Tommy had the Cort[place] tube fell off  Objective: Vitals:   09/12/17 1610 09/12/17 1737 09/12/17 2046 09/13/17 0514  BP:  114/68 102/74 (!) 132/58  Pulse:  82 83 (!) 102  Resp:  _0 Temp: 98.6 F (37 C) 98.1 F (36.7 C) 98.2 F (36.8 C) 98.4 F (36.9 C)  TempSrc: Oral Oral Oral Oral  SpO2:  98% 98% 92%  Weight:      Height:        Intake/Output Summary (Last 24 hours) at 09/13/2017 1331 Last data filed at 09/12/2017 1603 Gross per 24 hour  Intake 300 ml  Output 900 ml  Net -600 ml   Filed Weights   09/10/17 0502 09/11/17 0500 09/12/17 0500  Weight: 83 kg (182 lb 15.7 oz) 81.1 kg (178 lb 12.7 oz) 74 kg (163 lb 2.3 oz)    Examination:  General exam: middle-aged female, lying in bed, no distress HEENT: Neck no JVD, oral mucosa is dry Respiratory system: decreased breath sounds both bases, rest clear Cardiovascular system: S1 & S2 heard, RRR. SEM murmur and diastolic murmur  Gastrointestinal  system: Abdomen is nondistended, soft and nontender.Normal bowel sounds heard. Central nervous system: Alert and oriented. No focal neurological deficits. Extremities: Symmetric 5 x 5  power. Skin: No rashes, lesions or ulcers Psychiatry: Judgement and insight appear normal. Mood & affect appropriate.     Data Reviewed:   CBC: Recent Labs  Lab 09/09/17 0351 09/10/17 0235 09/11/17 0249 09/11/17 1022 09/13/17 0502  WBC 14.5* 15.0* 11.9* 14.1* 16.1*  NEUTROABS 10.9*  --   --  11.6*  --   HGB 10.1* 10.0* 8.4* 10.0* 10.1*  HCT 34.2* 33.0* 28.0* 32.5* 33.6*  MCV 92.7 89.4 90.9 89.0 89.1  PLT PLATELET CLUMPS NOTED ON SMEAR, UNABLE TO ESTIMATE 441* 554* 642* 643*   Basic Metabolic Panel: Recent Labs  Lab 09/07/17 0426  09/08/17 0413 09/09/17 0351 09/10/17 0235 09/11/17 0249 09/12/17 0251 09/13/17 0502  NA 148*   < > 145 142 139 136 137 137  K 2.9*   < > 3.3* 4.0 3.4* 4.1 3.0* 3.8  CL 108   < > 105 102 95* 90* 91* 97*  CO2 32   < > _0 21*  GLUCOSE 129*   < > 230* 179* 152* 86 144* 148*  BUN 31*   < > 24* 24* 20 24* 24* 21*  CREATININE 0.67   < > 0.55 0.65 0.65 0.74 0.92 0.85  CALCIUM 8.6*   < > 8.9 9.2 8.9 9.3 9.7 9.7  MG 1.9  --  1.8  --  1.5* 2.2  --  2.1  PHOS 2.1*  --  2.2*  --  3.2 4.4  --  3.1   < > = values in this interval not displayed.   GFR: Estimated Creatinine Clearance: 81.5 mL/min (by C-G formula based on SCr of 0.85 mg/dL). Liver Function Tests: No results for input(s): AST, ALT, ALKPHOS, BILITOT, PROT, ALBUMIN in the last 168 hours. No results for input(s): LIPASE, AMYLASE in the last 168 hours. No results for input(s): AMMONIA in the last 168 hours. Coagulation Profile: Recent Labs  Lab 09/08/17 0413 09/10/17 2012  INR 1.18 1.19   Cardiac Enzymes: No results for input(s): CKTOTAL, CKMB, CKMBINDEX, TROPONINI in the last 168 hours. BNP (last 3 results) No results for input(s): PROBNP in the last 8760 hours. HbA1C: No results for input(s): HGBA1C in the last 72 hours. CBG: Recent Labs  Lab 09/12/17 2023 09/13/17 0031 09/13/17 0407 09/13/17 0758 09/13/17 1154  GLUCAP 110* 128* 130* 129* 181*   Lipid  Profile: No results for input(s): CHOL, HDL, LDLCALC, TRIG, CHOLHDL, LDLDIRECT in the last 72 hours. Thyroid Function Tests: No results for input(s): TSH, T4TOTAL, FREET4, T3FREE, THYROIDAB in the last 72 hours. Anemia Panel: No results for input(s): VITAMINB12, FOLATE, FERRITIN, TIBC, IRON, RETICCTPCT in the last 72 hours. Urine analysis:    Component Value Date/Time   COLORURINE STRAW (A) 09/09/2017 0959   APPEARANCEUR CLEAR 09/09/2017 0959   LABSPEC 1.008 09/09/2017 0959   PHURINE 7.0 09/09/2017 0959   GLUCOSEU NEGATIVE 09/09/2017 0959   HGBUR SMALL (A) 09/09/2017 0959   BILIRUBINUR NEGATIVE 09/09/2017 0959   KETONESUR NEGATIVE 09/09/2017 0959   PROTEINUR NEGATIVE 09/09/2017 0959   UROBILINOGEN 0.2 10/28/2014 2330   NITRITE NEGATIVE 09/09/2017 0959   LEUKOCYTESUR NEGATIVE 09/09/2017 0959   Sepsis Labs: _1 (procalcitonin:4,lacticidven:4)  ) Recent Results (from the past 240 hour(s))  Culture, respiratory (NON-Expectorated)     Status: None   Collection Time: 09/05/17  4:00 PM  Result Value Ref Range Status  Specimen Description TRACHEAL ASPIRATE  Final   Special Requests NONE  Final   Gram Stain   Final    FEW WBC PRESENT, PREDOMINANTLY PMN RARE SQUAMOUS EPITHELIAL CELLS PRESENT NO ORGANISMS SEEN    Culture   Final    Consistent with normal respiratory flora. Performed at [facility], [place] [address] [place], [place] [address]    Report Status 09/08/2017 FINAL  Final  C difficile quick scan w PCR reflex     Status: None   Collection Time: 09/08/17  9:20 AM  Result Value Ref Range Status   C Diff antigen NEGATIVE NEGATIVE Final   C Diff toxin NEGATIVE NEGATIVE Final   C Diff interpretation No C. difficile detected.  Final    Comment: Performed at [facility], [place] [address] [place], [place] [address]  Culture, respiratory (NON-Expectorated)     Status: None   Collection Time: 09/08/17  2:36 PM  Result Value Ref Range Status   Specimen  Description TRACHEAL ASPIRATE  Final   Special Requests NONE  Final   Gram Stain   Final    MODERATE WBC PRESENT,BOTH PMN AND MONONUCLEAR RARE SQUAMOUS EPITHELIAL CELLS PRESENT NO ORGANISMS SEEN    Culture   Final    RARE Consistent with normal respiratory flora. Performed at [facility], [place] [address] [place], [place] [address]    Report Status 09/10/2017 FINAL  Final  Culture, blood (routine x 2)     Status: None (Preliminary result)   Collection Time: 09/09/17  3:51 AM  Result Value Ref Range Status   Specimen Description BLOOD LEFT HAND  Final   Special Requests   Final    BOTTLES DRAWN AEROBIC ONLY Blood Culture adequate volume   Culture   Final    NO GROWTH 4 DAYS Performed at [facility], [place] [address] [place], [place] [address]    Report Status PENDING  Incomplete  Culture, blood (routine x 2)     Status: None (Preliminary result)   Collection Time: 09/09/17  3:52 AM  Result Value Ref Range Status   Specimen Description BLOOD LEFT HAND  Final   Special Requests   Final    BOTTLES DRAWN AEROBIC ONLY Blood Culture adequate volume   Culture   Final    NO GROWTH 4 DAYS Performed at [facility], [place] [address], [place], [place] [address]    Report Status PENDING  Incomplete         Radiology Studies: Dg Chest Portable 1 View  Result Date: 09/12/2017 CLINICAL DATA:  ARDS EXAM: PORTABLE CHEST 1 VIEW COMPARISON:  09/11/2017 FINDINGS: Endotracheal tube and NG tube removed. Diffuse bilateral airspace disease unchanged. No pneumothorax or effusion. IMPRESSION: Endotracheal tube and NG tube removed Diffuse bilateral airspace disease unchanged. Electronically Signed   By: Franchot Gallo M.D.   On: 09/12/2017 11:18   Dg Swallowing Func-speech Pathology  Result Date: 09/12/2017 Objective Swallowing Evaluation: Type of Study: MBS-Modified Barium Swallow Study  Patient Details Name: BREELYN ICARD MRN: [id number] Date of Birth: September 03, 1965 Today's Date:  09/12/2017 Time: SLP Start Time (ACUTE ONLY): 1000 -SLP Stop Time (ACUTE ONLY): 1015 SLP Time Calculation (min) (ACUTE ONLY): 15 min Past Medical History: Past Medical History: Diagnosis Date . Acute respiratory failure ([place])  . Anemia 08-2008  Blood transfusion . Anxiety  . Benzodiazepine dependence ([place])  . Cervical cancer ([place])  . CHF (congestive heart failure) ([place])  . Depression  .  Diabetes mellitus without complication ([place])  . Headache(784.0)   migraines . Hypokalemia  . Legionella pneumonia ([place])  . Leukocytosis, unspecified  . Mitral stenosis  . Panic attacks  . Tobacco abuse  Past Surgical History: Past Surgical History: Procedure Laterality Date . BIOPSY  06/12/2017  Procedure: BIOPSY;  Surgeon: Daneil Dolin, MD;  Location: [facility];  Service: Gastroenterology;;  esophagus . cardiac valve repair  2011  stretched mitral valve . CERVICAL CONIZATION W/BX  2000 . CHOLECYSTECTOMY  06/26/2012  Procedure: LAPAROSCOPIC CHOLECYSTECTOMY WITH INTRAOPERATIVE CHOLANGIOGRAM;  Surgeon: Ralene Ok, MD;  Location: [facility] OR[facility];  Service: General;  Laterality: N/A; . COLONOSCOPY WITH PROPOFOL N/A 06/12/2017  Normal colon, normal TI, Grade I internal hemorrhoids . ESOPHAGOGASTRODUODENOSCOPY (EGD) WITH PROPOFOL N/A 06/12/2017  esophagitis, possibly chemical or pill-induced, s/p dilation. small hiatal hernia, normal duodenal bulb and D 2 . GIVENS CAPSULE STUDY N/A 07/19/2017  incomplete capsule study as did not reach cecum.  Marland Kitchen LASER ABLATION OF THE CERVIX   . MALONEY DILATION  06/12/2017  Procedure: MALONEY DILATION;  Surgeon: Daneil Dolin, MD;  Location: [facility];  Service: Gastroenterology;; . MOLE REMOVAL   . NASAL SINUS SURGERY   HPI: Adiba Fargnoli Aldersonis a 52 y.o.femalewith medical history significant ofmitral stenosis, DM type II, CHF last EF 65-70% with grade 1 diastolic dysfunction, mulltifocal PNA 07/2017 presents with worsening dyspnea. Intubated 3/2-3/21. CXR with bilateral infiltrates read as pulm  edema. BSE 10/2014 with acute reversible dysphagia after intubation and recommended full liquid diet. MBS recommended following BSE.  No data recorded Assessment / Plan / Recommendation CHL IP CLINICAL IMPRESSIONS 09/12/2017 Clinical Impression Pt exhibited moderate oropharyngeal marked by decreased timing and weakness of laryngeal structures resulting in aspiration of thin, nectar and honey thick barium. Penetration occurred slightly prior to epiglottic inversion and during swallow with mildly delayed weak reflexive cough and chin tuck posture was not effective strategy. Decreased oral cohesion, manipulation and delayed transit with all consistencies. Pt began to gag with mild emesis during study. Unable to transit puree texture, SLP suctioned from oral cavity. She is presently not safe with any po's; prognosis is good for return to swallow given extended time post 3 week intubation. Recommend short term alternative nutrition.  SLP Visit Diagnosis Dysphagia, oropharyngeal phase (R13.12) Attention and concentration deficit following -- Frontal lobe and executive function deficit following -- Impact on safety and function Moderate aspiration risk;Severe aspiration risk   CHL IP TREATMENT RECOMMENDATION 09/12/2017 Treatment Recommendations Therapy as outlined in treatment plan below   Prognosis 09/12/2017 Prognosis for Safe Diet Advancement Good Barriers to Reach Goals -- Barriers/Prognosis Comment -- CHL IP DIET RECOMMENDATION 09/12/2017 SLP Diet Recommendations NPO Liquid Administration via -- Medication Administration Via alternative means Compensations -- Postural Changes --   CHL IP OTHER RECOMMENDATIONS 09/12/2017 Recommended Consults -- Oral Care Recommendations Oral care QID Other Recommendations --   CHL IP FOLLOW UP RECOMMENDATIONS 09/12/2017 Follow up Recommendations 24 hour supervision/assistance   CHL IP FREQUENCY AND DURATION 09/12/2017 Speech Therapy Frequency (ACUTE ONLY) min 2x/week Treatment Duration 2 weeks       CHL IP ORAL PHASE 09/12/2017 Oral Phase Impaired Oral - Pudding Teaspoon -- Oral - Pudding Cup -- Oral - Honey Teaspoon Decreased bolus cohesion;Delayed oral transit;Holding of bolus;Reduced posterior propulsion;Weak lingual manipulation Oral - Honey Cup NT Oral - Nectar Teaspoon Decreased bolus cohesion;Delayed oral transit;Holding of bolus;Reduced posterior propulsion;Weak lingual manipulation Oral - Nectar Cup NT Oral - Nectar Straw -- Oral - Thin Teaspoon -- Oral -  Thin Cup Decreased bolus cohesion;Delayed oral transit;Holding of bolus;Reduced posterior propulsion;Weak lingual manipulation Oral - Thin Straw -- Oral - Puree Decreased bolus cohesion;Delayed oral transit;Holding of bolus;Reduced posterior propulsion;Weak lingual manipulation Oral - Mech Soft NT Oral - Regular NT Oral - Multi-Consistency NT Oral - Pill NT Oral Phase - Comment --  CHL IP PHARYNGEAL PHASE 09/12/2017 Pharyngeal Phase Impaired Pharyngeal- Pudding Teaspoon -- Pharyngeal -- Pharyngeal- Pudding Cup -- Pharyngeal -- Pharyngeal- Honey Teaspoon Penetration/Aspiration during swallow;Reduced laryngeal elevation;Reduced airway/laryngeal closure;Reduced epiglottic inversion Pharyngeal Material enters airway, passes BELOW cords and not ejected out despite cough attempt by patient Pharyngeal- Honey Cup NT Pharyngeal -- Pharyngeal- Nectar Teaspoon Penetration/Aspiration during swallow;Reduced epiglottic inversion;Reduced laryngeal elevation Pharyngeal Material enters airway, passes BELOW cords and not ejected out despite cough attempt by patient Pharyngeal- Nectar Cup NT Pharyngeal -- Pharyngeal- Nectar Straw -- Pharyngeal -- Pharyngeal- Thin Teaspoon -- Pharyngeal -- Pharyngeal- Thin Cup Penetration/Aspiration before swallow;Penetration/Aspiration during swallow;Reduced airway/laryngeal closure Pharyngeal Material enters airway, passes BELOW cords and not ejected out despite cough attempt by patient Pharyngeal- Thin Straw -- Pharyngeal --  Pharyngeal- Puree (No Data) Pharyngeal -- Pharyngeal- Mechanical Soft -- Pharyngeal -- Pharyngeal- Regular -- Pharyngeal -- Pharyngeal- Multi-consistency -- Pharyngeal -- Pharyngeal- Pill -- Pharyngeal -- Pharyngeal Comment --  CHL IP CERVICAL ESOPHAGEAL PHASE 09/12/2017 Cervical Esophageal Phase WFL Pudding Teaspoon -- Pudding Cup -- Honey Teaspoon -- Honey Cup -- Nectar Teaspoon -- Nectar Cup -- Nectar Straw -- Thin Teaspoon -- Thin Cup -- Thin Straw -- Puree -- Mechanical Soft -- Regular -- Multi-consistency -- Pill -- Cervical Esophageal Comment -- No flowsheet data found. [name] 09/12/2017, 1:08 PM                   Scheduled Meds: . chlorhexidine  15 mL Mouth Rinse BID  . FLUoxetine  20 mg Per Tube QPC breakfast  . Gerhardt's butt cream   Topical BID  . insulin aspart  0-20 Units Subcutaneous Q4H  . insulin aspart  4 Units Subcutaneous Q4H  . insulin glargine  18 Units Subcutaneous QHS  . mouth rinse  15 mL Mouth Rinse BID  . mouth rinse  15 mL Mouth Rinse q12n4p  . metoprolol tartrate  50 mg Per Tube BID  . midazolam  4 mg Intravenous Once  . multivitamin  1 tablet Oral Daily  . pantoprazole sodium  40 mg Per Tube BID  . rosuvastatin  10 mg Per Tube q1800  . traZODone  100 mg Per Tube QHS   Continuous Infusions: . sodium chloride Stopped (09/08/17 1448)  . sodium chloride 10 mL/hr at 09/11/17 1800  . anidulafungin 100 mg (09/13/17 1113)  . feeding supplement (VITAL AF 1.2 CAL) Stopped (09/11/17 0100)     LOS: 20 days    Time spent: 66mn    PDomenic Polite MD [facility] Page via [email or web address], password [email or web address] After 7PM please contact night-coverage  09/13/2017, 1:31 PM

## 2017-09-14 LAB — CULTURE, BLOOD (ROUTINE X 2)
CULTURE: NO GROWTH
Culture: NO GROWTH
SPECIAL REQUESTS: ADEQUATE
Special Requests: ADEQUATE

## 2017-09-14 LAB — GLUCOSE, CAPILLARY
GLUCOSE-CAPILLARY: 174 mg/dL — AB (ref 65–99)
GLUCOSE-CAPILLARY: 265 mg/dL — AB (ref 65–99)
GLUCOSE-CAPILLARY: 133 mg/dL — AB (ref 65–99)
Glucose-Capillary: 192 mg/dL — ABNORMAL HIGH (ref 65–99)
Glucose-Capillary: 151 mg/dL — ABNORMAL HIGH (ref 65–99)
Glucose-Capillary: 216 mg/dL — ABNORMAL HIGH (ref 65–99)

## 2017-09-14 MED ORDER — LORAZEPAM 2 MG/ML IJ SOLN
1.0000 mg | Freq: Once | INTRAMUSCULAR | Status: AC
  Administered 2017-09-14: 1 mg via INTRAVENOUS
  Filled 2017-09-14: qty 1

## 2017-09-14 NOTE — Progress Notes (Signed)
Husband, Holly Figueroa at bedside, pt sitting up on side of bed.

## 2017-09-14 NOTE — Evaluation (Signed)
Occupational Therapy Evaluation Patient Details Name: Holly Figueroa MRN: [id number] DOB: April 07, 1966 Today's Date: 09/14/2017    History of Present Illness Pt. is a 52 y.o. F with significant PMH of mitral stenosis, DM type II, CHF, who was admitted with ARDS from influenza B pneumonia complicated by pulmonary edema and prolonged respiratory failure. Patient was intubated 3/2-3/21.      Clinical Impression   Pt reports she was independent with ADL PTA. Currently pt requires min assist +2 for stand pivot transfer and min-mod assist overall for ADL. Unsure of pts cognitive baseline, however, at this time she is impulsive with movement, demonstrates poor safety awareness and short term memory but is able to follow one step commands consistently throughout session. Recommending CIR level therapies to maximize independence and safety with ADL and functional mobility prior to return home. Pt would benefit from continued skilled OT to address established goals.    Follow Up Recommendations  CIR;Supervision/Assistance - 24 hour    Equipment Recommendations  Other (comment)(TBD at next venue)    Recommendations for Other Services       Precautions / Restrictions Precautions Precautions: Fall Restrictions Weight Bearing Restrictions: No      Mobility Bed Mobility Overal bed mobility: Needs Assistance Bed Mobility: Supine to Sit     Supine to sit: Min guard;HOB elevated     General bed mobility comments: HOB elevated with use of bed rails. Cues for initiation and sequencing but pt able to perform without physical assist. Min guard for safety  Transfers Overall transfer level: Needs assistance Equipment used: 2 person hand held assist Transfers: Sit to/from Stand;Stand Pivot Transfers Sit to Stand: Min assist;+2 safety/equipment;+2 physical assistance Stand pivot transfers: Min assist;+2 physical assistance;+2 safety/equipment       General transfer comment: Min assist for  balance with boost up from EOB and for pivot to chair. Pt able to take pivotal steps going to L side     Balance Overall balance assessment: Needs assistance Sitting-balance support: Feet supported;No upper extremity supported Sitting balance-Leahy Scale: Good     Standing balance support: Bilateral upper extremity supported;During functional activity Standing balance-Leahy Scale: Poor                             ADL either performed or assessed with clinical judgement   ADL Overall ADL's : Needs assistance/impaired Eating/Feeding: NPO   Grooming: Set up;Supervision/safety;Sitting Grooming Details (indicate cue type and reason): Encouraged functional independence with grooming tasks with staff Upper Body Bathing: Minimal assistance;Sitting   Lower Body Bathing: Moderate assistance;Sit to/from stand   Upper Body Dressing : Minimal assistance;Sitting   Lower Body Dressing: Moderate assistance;Sit to/from stand   Toilet Transfer: Minimal assistance;+2 for safety/equipment;Stand-pivot;+2 for physical assistance Toilet Transfer Details (indicate cue type and reason): HHA +2, cues for safety, simulated by stand pivot EOB to chair         Functional mobility during ADLs: Minimal assistance;+2 for safety/equipment;+2 for physical assistance(HHA for stand pivot) General ADL Comments: Encouraged pressure relief when sitting in chair--leaning side to side and chair pushups, pt acknowledged understanding      Vision         Perception     Praxis      Pertinent Vitals/Pain Pain Assessment: Faces Faces Pain Scale: No hurt     Hand Dominance Right   Extremity/Trunk Assessment Upper Extremity Assessment Upper Extremity Assessment: Generalized weakness   Lower Extremity Assessment Lower Extremity Assessment:  Defer to PT evaluation       Communication Communication Communication: Other (comment)(limited verbal communication; soft spoken)   Cognition  Arousal/Alertness: Awake/alert Behavior During Therapy: Impulsive Overall Cognitive Status: No family/caregiver present to determine baseline cognitive functioning Area of Impairment: Memory;Following commands;Safety/judgement;Awareness;Problem solving                     Memory: Decreased short-term memory Following Commands: Follows one step commands consistently Safety/Judgement: Decreased awareness of safety;Decreased awareness of deficits Awareness: Intellectual(emergent at times) Problem Solving: Requires verbal cues General Comments: Pt impulsive with movement. Attempting to get up from chair seconds after therapist left room despite education on calling for assistance   General Comments       Exercises     Shoulder Instructions      Home Living Family/patient expects to be discharged to:: Unsure Living Arrangements: Spouse/significant other Available Help at Discharge: Family Type of Home: House       Home Layout: Multi-level;Bed/bath upstairs     Bathroom Shower/Tub: Tub/shower unit;Walk-in shower   Bathroom Toilet: Standard     Home Equipment: None          Prior Functioning/Environment Level of Independence: Independent        Comments: Pt providing information, unsure of accuracy        OT Problem List: Decreased strength;Decreased activity tolerance;Impaired balance (sitting and/or standing);Decreased cognition;Decreased safety awareness;Decreased knowledge of use of DME or AE;Decreased knowledge of precautions;Cardiopulmonary status limiting activity      OT Treatment/Interventions: Self-care/ADL training;Therapeutic exercise;Energy conservation;DME and/or AE instruction;Therapeutic activities;Cognitive remediation/compensation;Patient/family education;Balance training    OT Goals(Current goals can be found in the care plan section) Acute Rehab OT Goals Patient Stated Goal: get moving OT Goal Formulation: With patient Time For Goal  Achievement: 09/28/17 Potential to Achieve Goals: Good ADL Goals Pt Will Perform Grooming: (P) with min guard assist;standing Pt Will Perform Upper Body Bathing: (P) with set-up;with supervision;sitting Pt Will Perform Lower Body Bathing: (P) with min guard assist;sit to/from stand Pt Will Transfer to Toilet: (P) with min assist;ambulating;bedside commode Pt Will Perform Toileting - Clothing Manipulation and hygiene: (P) with min guard assist;sit to/from stand Additional ADL Goal #1: (P) Pt will demonstrate anticipatory awareness during ADL.  OT Frequency: Min 2X/week   Barriers to D/C:            Co-evaluation              AM-PAC PT "6 Clicks" Daily Activity     Outcome Measure Help from another person eating meals?: Total Help from another person taking care of personal grooming?: A Little Help from another person toileting, which includes using toliet, bedpan, or urinal?: A Lot Help from another person bathing (including washing, rinsing, drying)?: A Lot Help from another person to put on and taking off regular upper body clothing?: A Little Help from another person to put on and taking off regular lower body clothing?: A Lot 6 Click Score: 13   End of Session Equipment Utilized During Treatment: Oxygen Nurse Communication: Mobility status;Other (comment)(?cognition, please order geomat)  Activity Tolerance: Patient tolerated treatment well Patient left: in chair;with call bell/phone within reach;with chair alarm set;with nursing/sitter in room  OT Visit Diagnosis: Unsteadiness on feet (R26.81);Other abnormalities of gait and mobility (R26.89);Muscle weakness (generalized) (M62.81);Cognitive communication deficit (R41.841)                Time: 5427-0623 OT Time Calculation (min): 13 min Charges:  OT General Charges $OT Visit: 1  Visit OT Evaluation $OT Eval Moderate Complexity: 1 Mod G-Codes:     Zylpha Poynor A. Ulice Brilliant, M.S., OTR/L Pager: [place] 09/14/2017, 10:37 AM

## 2017-09-14 NOTE — Progress Notes (Signed)
Pt is alert in room with safety sitter present.  Pt continues to be unsteady on feet while walking to the [facility] and needs assistance of 1-2.  Pt was able to state her name and her husband's name, children and how many grandchildren she has.  Hair washed and mouth care completed multiple times today.  Pt is quick and impulsive and has a lack of awareness of safety concerns.

## 2017-09-14 NOTE — Progress Notes (Signed)
Skin care done to sacral area and bottom.  Gerhardts butt cream applied to area and left open to air. Pt turned so she is off of her bottom.  Prevalon boots ordered for pt.  Noted extremely red areas on fingertips and outer extremity of the toes and heels.  These areas are blanchable.  TF with glucerna up to 70 as is goal rate.  HOB up 31 degrees.  O2 sat was 88% initially and with O2 2L went up to 93%, will continue to monitor and use O2 as needed.

## 2017-09-14 NOTE — Progress Notes (Signed)
PROGRESS NOTE    Holly Figueroa  OMV:[id number] DOB: 1966/02/16 DOA: 08/24/2017 PCP: [place] Police, MD  /02 Admit with SOB, vomited on BiPAP >intubated, ARDS  3/03 Placed on paralytics, requiring pressors 3/05 PRBC x1. Paralytics stopped 3/07 Off pressors. 3/08 Bronch >>unexpectedly showed focal bleeding from right upper lobe, anterior segment 3/09 Reparalysed, proned 3/10 placed supine 3/11 NMB stopped.  3/13 worsening hypoxia, nimbex resumed 3/14 placing PA catheter and then dissipating transition to [facility] 3/15 TEE, nimbex d/c'ed >  3/17 Bronch: diffuse bronchitis, bloody secretions 3/20 extubated   Brief Narrative: PCCM transferred to [facility] 3/22 Briefly this is a 52 year old female with history of severe rheumatic mitral stenosis, remote mitral valvotomy 2012 at [place] admitted to the critical care service on 3/2 with severe hypoxemia, ARDS from influenza B pneumonia complicated by prolonged respiratory failure, was intubated for almost 19 days, finally extubated 3/20 -hospitalization complicated by healthcare associated pneumonia, BAL was positive for Candida -Finally improving, transferred to [facility] into the floor 3/22 -Failed swallow evaluation, recommended to have short-term alternate method of nutrition    Assessment & Plan:   Active Problems:   Acute respiratory failure (HCC) -complicated hospitalization with prolonged respiratory failure requiring mechanical ventilation -Intubated 3/2 and extubated 3/20 -resolved -Influenza B positive, completed course of Tamiflu -Blood cultures negative, strep pneumo antigen was negative tracheal aspirate negative -BAL on 3/8 with Candida tropicalis and glabrata -continue anidulafungin, day 5 of 7  Dysphagia -Suspected to be secondary to prolonged mechanical ventilation -Expect this to improve, SLP evaluation and modified barium swallow from 3/21 noted -Cortrak tube to be replaced 3/22 , tolerating tube feeds and meds per  tube -Plan for repeat swallowing assessment hopefully tomorrow  Severe sepsis -Resolved  Type 2 diabetes mellitus -metformin on hold, Continue current dose of Lantus, sliding scale insulin for now  Chronic diastolic CHF -clinically euvolemic at this time, 9L negative -Monitor urine output and B met daily -stopped IV fluids  Rheumatic valcular dz/Moderate mitral stenosis and aortic stenosis -Per TEE 3/15 -Appreciate cards input, hold diuretics at this time -Assess need for diuretics on a daily basis, continue metoprolol -Follow-up with cardiology  DVT prophylaxis: Lovenox Code Status: full code Family Communication:no family at bedside Disposition Plan: hopeful for CIR early next week  Consultants:   PCCM  Cards   Procedures:   Antimicrobials:    Subjective: -feels okay, tolerating tube feeds  Objective: Vitals:   09/13/17 1948 09/14/17 0433 09/14/17 0810 09/14/17 1046  BP: 131/65 128/68 113/65 132/61  Pulse: (!) 111 100 96 (!) 110  Resp: _0 Temp: 99.6 F (37.6 C) 98.3 F (36.8 C) 99.6 F (37.6 C) 98.1 F (36.7 C)  TempSrc: Oral Axillary Axillary Oral  SpO2: 91% 93% 94% 93%  Weight:      Height:        Intake/Output Summary (Last 24 hours) at 09/14/2017 1257 Last data filed at 09/14/2017 6283 Gross per 24 hour  Intake 602.33 ml  Output -  Net 602.33 ml   Filed Weights   09/10/17 0502 09/11/17 0500 09/12/17 0500  Weight: 83 kg (182 lb 15.7 oz) 81.1 kg (178 lb 12.7 oz) 74 kg (163 lb 2.3 oz)    Examination:  General exam: chronically ill-appearing female, in better spirits today, AAO 3, no distress HEENT: no JVD Respiratory system: Improved air movement, clear bilaterally Cardiovascular system: S1-S2/regular rate rhythm, systolic ejection murmur and diastolic murmur noted Gastrointestinal system: soft, nontender, positive bowel sounds Central nervous system: Alert  and oriented. No focal neurological deficits. Extremities: Symmetric 5 x  5 power. Skin: No rashes, lesions or ulcers Psychiatry: Judgement and insight appear normal. Mood & affect appropriate.     Data Reviewed:   CBC: Recent Labs  Lab 09/09/17 0351 09/10/17 0235 09/11/17 0249 09/11/17 1022 09/13/17 0502  WBC 14.5* 15.0* 11.9* 14.1* 16.1*  NEUTROABS 10.9*  --   --  11.6*  --   HGB 10.1* 10.0* 8.4* 10.0* 10.1*  HCT 34.2* 33.0* 28.0* 32.5* 33.6*  MCV 92.7 89.4 90.9 89.0 89.1  PLT PLATELET CLUMPS NOTED ON SMEAR, UNABLE TO ESTIMATE 441* 554* 642* 034*   Basic Metabolic Panel: Recent Labs  Lab 09/08/17 0413 09/09/17 0351 09/10/17 0235 09/11/17 0249 09/12/17 0251 09/13/17 0502  NA 145 142 139 136 137 137  K 3.3* 4.0 3.4* 4.1 3.0* 3.8  CL 105 102 95* 90* 91* 97*  CO2 _0 21*  GLUCOSE 230* 179* 152* 86 144* 148*  BUN 24* 24* 20 24* 24* 21*  CREATININE 0.55 0.65 0.65 0.74 0.92 0.85  CALCIUM 8.9 9.2 8.9 9.3 9.7 9.7  MG 1.8  --  1.5* 2.2  --  2.1  PHOS 2.2*  --  3.2 4.4  --  3.1   GFR: Estimated Creatinine Clearance: 81.5 mL/min (by C-G formula based on SCr of 0.85 mg/dL). Liver Function Tests: No results for input(s): AST, ALT, ALKPHOS, BILITOT, PROT, ALBUMIN in the last 168 hours. No results for input(s): LIPASE, AMYLASE in the last 168 hours. No results for input(s): AMMONIA in the last 168 hours. Coagulation Profile: Recent Labs  Lab 09/08/17 0413 09/10/17 2012  INR 1.18 1.19   Cardiac Enzymes: No results for input(s): CKTOTAL, CKMB, CKMBINDEX, TROPONINI in the last 168 hours. BNP (last 3 results) No results for input(s): PROBNP in the last 8760 hours. HbA1C: No results for input(s): HGBA1C in the last 72 hours. CBG: Recent Labs  Lab 09/13/17 1944 09/13/17 2353 09/14/17 0430 09/14/17 0740 09/14/17 1230  GLUCAP 143* 181* 174* 192* 265*   Lipid Profile: No results for input(s): CHOL, HDL, LDLCALC, TRIG, CHOLHDL, LDLDIRECT in the last 72 hours. Thyroid Function Tests: No results for input(s): TSH, T4TOTAL,  FREET4, T3FREE, THYROIDAB in the last 72 hours. Anemia Panel: No results for input(s): VITAMINB12, FOLATE, FERRITIN, TIBC, IRON, RETICCTPCT in the last 72 hours. Urine analysis:    Component Value Date/Time   COLORURINE STRAW (A) 09/09/2017 0959   APPEARANCEUR CLEAR 09/09/2017 0959   LABSPEC 1.008 09/09/2017 0959   PHURINE 7.0 09/09/2017 0959   GLUCOSEU NEGATIVE 09/09/2017 0959   HGBUR SMALL (A) 09/09/2017 0959   BILIRUBINUR NEGATIVE 09/09/2017 0959   KETONESUR NEGATIVE 09/09/2017 0959   PROTEINUR NEGATIVE 09/09/2017 0959   UROBILINOGEN 0.2 10/28/2014 2330   NITRITE NEGATIVE 09/09/2017 0959   LEUKOCYTESUR NEGATIVE 09/09/2017 0959   Sepsis Labs: _1 (procalcitonin:4,lacticidven:4)  ) Recent Results (from the past 240 hour(s))  Culture, respiratory (NON-Expectorated)     Status: None   Collection Time: 09/05/17  4:00 PM  Result Value Ref Range Status   Specimen Description TRACHEAL ASPIRATE  Final   Special Requests NONE  Final   Gram Stain   Final    FEW WBC PRESENT, PREDOMINANTLY PMN RARE SQUAMOUS EPITHELIAL CELLS PRESENT NO ORGANISMS SEEN    Culture   Final    Consistent with normal respiratory flora. Performed at [facility], [place] [address], [place], [place] [address]    Report Status 09/08/2017 FINAL  Final  C difficile quick scan w PCR reflex     Status: None   Collection Time: 09/08/17  9:20 AM  Result Value Ref Range Status   C Diff antigen NEGATIVE NEGATIVE Final   C Diff toxin NEGATIVE NEGATIVE Final   C Diff interpretation No C. difficile detected.  Final    Comment: Performed at [facility], [place] [address] [place], [place] [address]  Culture, respiratory (NON-Expectorated)     Status: None   Collection Time: 09/08/17  2:36 PM  Result Value Ref Range Status   Specimen Description TRACHEAL ASPIRATE  Final   Special Requests NONE  Final   Gram Stain   Final    MODERATE WBC PRESENT,BOTH PMN AND MONONUCLEAR RARE SQUAMOUS EPITHELIAL CELLS  PRESENT NO ORGANISMS SEEN    Culture   Final    RARE Consistent with normal respiratory flora. Performed at [facility], [place] [address] [place], [place] [address]    Report Status 09/10/2017 FINAL  Final  Culture, blood (routine x 2)     Status: None (Preliminary result)   Collection Time: 09/09/17  3:51 AM  Result Value Ref Range Status   Specimen Description BLOOD LEFT HAND  Final   Special Requests   Final    BOTTLES DRAWN AEROBIC ONLY Blood Culture adequate volume   Culture   Final    NO GROWTH 4 DAYS Performed at [facility], [place] [address] [place], [place] [address]    Report Status PENDING  Incomplete  Culture, blood (routine x 2)     Status: None (Preliminary result)   Collection Time: 09/09/17  3:52 AM  Result Value Ref Range Status   Specimen Description BLOOD LEFT HAND  Final   Special Requests   Final    BOTTLES DRAWN AEROBIC ONLY Blood Culture adequate volume   Culture   Final    NO GROWTH 4 DAYS Performed at [facility], [place] [address], [place], [place] [address]    Report Status PENDING  Incomplete         Radiology Studies: No results found.      Scheduled Meds: . chlorhexidine  15 mL Mouth Rinse BID  . enoxaparin (LOVENOX) injection  40 mg Subcutaneous Q24H  . feeding supplement (PRO-STAT SUGAR FREE 64)  30 mL Per Tube Daily  . FLUoxetine  20 mg Per Tube QPC breakfast  . free water  30-60 mL Per Tube Q8H  . Gerhardt's butt cream   Topical BID  . insulin aspart  0-20 Units Subcutaneous Q4H  . insulin aspart  4 Units Subcutaneous Q4H  . insulin glargine  18 Units Subcutaneous QHS  . mouth rinse  15 mL Mouth Rinse BID  . mouth rinse  15 mL Mouth Rinse q12n4p  . metoprolol tartrate  50 mg Per Tube BID  . multivitamin  1 tablet Oral Daily  . pantoprazole sodium  40 mg Per Tube BID  . rosuvastatin  10 mg Per Tube q1800  . traZODone  100 mg Per Tube QHS   Continuous Infusions: . sodium chloride 10 mL/hr at 09/11/17 1800    . anidulafungin 100 mg (09/14/17 1145)  . feeding supplement (GLUCERNA 1.2 CAL) 60 mL/hr at 09/14/17 0415     LOS: 21 days    Time spent: 81mn    PDomenic Polite MD [facility] Page via [email or web address], password [email or web address] After 7PM please contact night-coverage  09/14/2017, 12:57 PM

## 2017-09-14 NOTE — Progress Notes (Signed)
Bilateral Prevalon boots applied to feet.

## 2017-09-14 NOTE — Progress Notes (Signed)
Pt very restless, agitated several attempts to get out of bed.Bed alarm on. Matts on the floor. On call MD notified with new orders received.

## 2017-09-14 NOTE — Progress Notes (Signed)
   09/14/17 1046  What Happened  Was fall witnessed? Yes  Who witnessed fall? Janan Halter, NS working with Hulan Amato, RN  Patients activity before fall other (comment) (sitting in chair with feet up, chair alarm on)  Point of contact hip/leg;other (comment) (left)  Was patient injured? Unsure  Follow Up  MD notified Dr. Fanny Bien  Time MD notified 23  Family notified Yes-comment (Husband, Pieter Partridge)  Time family notified 1115  Additional tests No  Simple treatment Other (comment) (IV came out and was dressed)  Progress note created (see row info) Yes  Adult Fall Risk Assessment  Risk Factor Category (scoring not indicated) Fall has occurred during this admission (document High fall risk);High fall risk per protocol (document High fall risk)  Age 52  Fall History: Fall within 6 months prior to admission 0  Elimination; Bowel and/or Urine Incontinence 0  Elimination; Bowel and/or Urine Urgency/Frequency 0  Medications: includes PCA/Opiates, Anti-convulsants, Anti-hypertensives, Diuretics, Hypnotics, Laxatives, Sedatives, and Psychotropics 3  Patient Care Equipment 2  Mobility-Assistance 2  Mobility-Gait 2  Mobility-Sensory Deficit 0  Altered awareness of immediate physical environment 1  Impulsiveness 2  Lack of understanding of one's physical/cognitive limitations 4  Total Score 16  Patient's Fall Risk High Fall Risk (>13 points)  Adult Fall Risk Interventions  Required Bundle Interventions *See Row Information* High fall risk - low, moderate, and high requirements implemented  Additional Interventions Camera surveillance (with patient/family notification & education);PT/OT need assessed if change in mobility from baseline;Room near nurses station;Safety Sitter/Safety Rounder;Use of appropriate toileting equipment (bedpan, BSC, etc.);Other (Comment) Conservator, museum/gallery ordered and coming)  Screening for Fall Injury Risk (To be completed on HIGH fall risk patients) - Assessing Need for  Low Bed  Risk For Fall Injury- Low Bed Criteria None identified - Continue screening  Screening for Fall Injury Risk (To be completed on HIGH fall risk patients who do not meet crieteria for Low Bed) - Assessing Need for Floor Mats Only  Risk For Fall Injury- Criteria for Floor Mats Confusion/dementia (+CAM, CIWA, TBI, etc.);Noncompliant with safety precautions  Will Implement Floor Mats Yes (Mats already in place)  Vitals  Temp 98.1 F (36.7 C)  Temp Source Oral  BP 132/61  BP Location Left Arm  BP Method Automatic  Patient Position (if appropriate) Lying  Pulse Rate (!) 110  Pulse Rate Source Dinamap  Resp 18  Oxygen Therapy  SpO2 93 %  O2 Device Room Air  Pain Assessment  Pain Scale Faces  Faces Pain Scale 2 (only c/o pain at the pressure injury sacral area)  Pain Location Sacrum

## 2017-09-15 LAB — GLUCOSE, CAPILLARY
GLUCOSE-CAPILLARY: 149 mg/dL — AB (ref 65–99)
GLUCOSE-CAPILLARY: 206 mg/dL — AB (ref 65–99)
Glucose-Capillary: 154 mg/dL — ABNORMAL HIGH (ref 65–99)
Glucose-Capillary: 294 mg/dL — ABNORMAL HIGH (ref 65–99)
Glucose-Capillary: 110 mg/dL — ABNORMAL HIGH (ref 65–99)

## 2017-09-15 MED ORDER — LORAZEPAM 2 MG/ML IJ SOLN
1.0000 mg | Freq: Once | INTRAMUSCULAR | Status: AC
  Administered 2017-09-15: 1 mg via INTRAVENOUS
  Filled 2017-09-15: qty 1

## 2017-09-15 MED ORDER — ALPRAZOLAM 0.5 MG PO TABS
0.5000 mg | ORAL_TABLET | Freq: Two times a day (BID) | ORAL | Status: DC | PRN
  Administered 2017-09-15 – 2017-09-18 (×6): 0.5 mg via ORAL
  Filled 2017-09-15 (×6): qty 1

## 2017-09-15 MED ORDER — INSULIN GLARGINE 100 UNIT/ML ~~LOC~~ SOLN
22.0000 [IU] | Freq: Every day | SUBCUTANEOUS | Status: DC
  Administered 2017-09-15 – 2017-09-17 (×3): 22 [IU] via SUBCUTANEOUS
  Filled 2017-09-15 (×3): qty 0.22

## 2017-09-15 NOTE — Progress Notes (Signed)
Pt requesting xanax, she feels anxious. Dr. Broadus John texted.

## 2017-09-15 NOTE — Progress Notes (Signed)
PROGRESS NOTE    Holly Figueroa  SFJ:[id number] DOB: [birth year] DOA: 08/24/2017 PCP: [place]ane Police, MD  /02 Admit with SOB, vomited on BiPAP >intubated, ARDS  3/03 Placed on paralytics, requiring pressors 3/05 PRBC x1. Paralytics stopped 3/07 Off pressors. 3/08 Bronch >>unexpectedly showed focal bleeding from right upper lobe, anterior segment 3/09 Reparalysed, proned 3/10 placed supine 3/11 NMB stopped.  3/13 worsening hypoxia, nimbex resumed 3/14 placing PA catheter and then dissipating transition to [facility] 3/15 TEE, nimbex d/c'ed >  3/17 Bronch: diffuse bronchitis, bloody secretions 3/20 extubated   Brief Narrative: PCCM transferred to [facility] 3/22 Briefly this is a 52 year old female with history of severe rheumatic mitral stenosis, remote mitral valvotomy 2012 at [place] admitted to the critical care service on 3/2 with severe hypoxemia, ARDS from influenza B pneumonia complicated by prolonged respiratory failure, was intubated for almost 19 days, finally extubated 3/20 -hospitalization complicated by healthcare associated pneumonia, BAL was positive for Candida -Finally improving, transferred to [facility] into the floor 3/22 -Failed swallow evaluation, recommended to have short-term alternate method of nutrition  -awaiting repeat swallowing assessment  Assessment & Plan:   Active Problems:   Acute respiratory failure (HCC) -complicated hospitalization with prolonged respiratory failure requiring mechanical ventilation -Intubated 3/2 and extubated 3/20 -resolved -Influenza B positive, completed course of Tamiflu -Blood cultures negative, strep pneumo antigen was negative tracheal aspirate negative -BAL on 3/8 with Candida tropicalis and glabrata -continue anidulafungin, day 6 of 7  Dysphagia -Suspected to be secondary to prolonged mechanical ventilation -Expect this to improve, SLP evaluation and modified barium swallow from 3/21 noted -Cortrak tube to be replaced 3/22 ,  tolerating tube feeds and meds per tube -awaiting repeat swallowing assessment today or tomorrow  Severe sepsis -Resolved  Type 2 diabetes mellitus -metformin on hold, Continue current dose of Lantus, sliding scale insulin for now  Chronic diastolic CHF -clinically euvolemic at this time, 9L negative -Monitor urine output and B met daily -stopped IV fluids  Rheumatic valcular dz/Moderate mitral stenosis and aortic stenosis -Per TEE 3/15 -Appreciate cards input, hold diuretics at this time -Assess need for diuretics on a daily basis, continue metoprolol -Follow-up with cardiology  DVT prophylaxis: Lovenox Code Status: full code Family Communication:no family at bedside Disposition Plan: hopeful for CIR early next week  Consultants:   PCCM  Cards   Procedures:   Antimicrobials:    Subjective: -feels okay, tolerating tube feeds  Objective: Vitals:   09/14/17 1046 09/14/17 1345 09/15/17 0000 09/15/17 0400  BP: 132/61 133/72 131/79 128/62  Pulse: (!) 110 91 (!) 101 99  Resp: _0 Temp: 98.1 F (36.7 C) 98.4 F (36.9 C) 99.3 F (37.4 C) 98.6 F (37 C)  TempSrc: Oral Oral Oral Oral  SpO2: 93% 99% 96% 96%  Weight:      Height:        Intake/Output Summary (Last 24 hours) at 09/15/2017 1331 Last data filed at 09/14/2017 1510 Gross per 24 hour  Intake 984 ml  Output -  Net 984 ml   Filed Weights   09/10/17 0502 09/11/17 0500 09/12/17 0500  Weight: 83 kg (182 lb 15.7 oz) 81.1 kg (178 lb 12.7 oz) 74 kg (163 lb 2.3 oz)    Examination:  Gen: chronically ill female, in better spirits today, AAO 2, no distress HEENT: PERRLA, Neck supple, no JVD Lungs: improved air movement, clear bilaterally CVS: S1-S2/regular rate rhythm, systolic ejection murmur and diastolic murmur noted Abd: soft, Non tender, non distended, BS present  Extremities: No Cyanosis, Clubbing or edema Skin: no new rashes Psychiatry: Judgement and insight appear normal. Mood & affect  appropriate.     Data Reviewed:   CBC: Recent Labs  Lab 09/09/17 0351 09/10/17 0235 09/11/17 0249 09/11/17 1022 09/13/17 0502  WBC 14.5* 15.0* 11.9* 14.1* 16.1*  NEUTROABS 10.9*  --   --  11.6*  --   HGB 10.1* 10.0* 8.4* 10.0* 10.1*  HCT 34.2* 33.0* 28.0* 32.5* 33.6*  MCV 92.7 89.4 90.9 89.0 89.1  PLT PLATELET CLUMPS NOTED ON SMEAR, UNABLE TO ESTIMATE 441* 554* 642* 195*   Basic Metabolic Panel: Recent Labs  Lab 09/09/17 0351 09/10/17 0235 09/11/17 0249 09/12/17 0251 09/13/17 0502  NA 142 139 136 137 137  K 4.0 3.4* 4.1 3.0* 3.8  CL 102 95* 90* 91* 97*  CO2 _0 21*  GLUCOSE 179* 152* 86 144* 148*  BUN 24* 20 24* 24* 21*  CREATININE 0.65 0.65 0.74 0.92 0.85  CALCIUM 9.2 8.9 9.3 9.7 9.7  MG  --  1.5* 2.2  --  2.1  PHOS  --  3.2 4.4  --  3.1   GFR: Estimated Creatinine Clearance: 81.5 mL/min (by C-G formula based on SCr of 0.85 mg/dL). Liver Function Tests: No results for input(s): AST, ALT, ALKPHOS, BILITOT, PROT, ALBUMIN in the last 168 hours. No results for input(s): LIPASE, AMYLASE in the last 168 hours. No results for input(s): AMMONIA in the last 168 hours. Coagulation Profile: Recent Labs  Lab 09/10/17 2012  INR 1.19   Cardiac Enzymes: No results for input(s): CKTOTAL, CKMB, CKMBINDEX, TROPONINI in the last 168 hours. BNP (last 3 results) No results for input(s): PROBNP in the last 8760 hours. HbA1C: No results for input(s): HGBA1C in the last 72 hours. CBG: Recent Labs  Lab 09/14/17 2214 09/15/17 0012 09/15/17 0431 09/15/17 0800 09/15/17 1230  GLUCAP 216* 149* 154* 206* 294*   Lipid Profile: No results for input(s): CHOL, HDL, LDLCALC, TRIG, CHOLHDL, LDLDIRECT in the last 72 hours. Thyroid Function Tests: No results for input(s): TSH, T4TOTAL, FREET4, T3FREE, THYROIDAB in the last 72 hours. Anemia Panel: No results for input(s): VITAMINB12, FOLATE, FERRITIN, TIBC, IRON, RETICCTPCT in the last 72 hours. Urine analysis:      Component Value Date/Time   COLORURINE STRAW (A) 09/09/2017 0959   APPEARANCEUR CLEAR 09/09/2017 0959   LABSPEC 1.008 09/09/2017 0959   PHURINE 7.0 09/09/2017 0959   GLUCOSEU NEGATIVE 09/09/2017 0959   HGBUR SMALL (A) 09/09/2017 0959   BILIRUBINUR NEGATIVE 09/09/2017 0959   KETONESUR NEGATIVE 09/09/2017 0959   PROTEINUR NEGATIVE 09/09/2017 0959   UROBILINOGEN 0.2 10/28/2014 2330   NITRITE NEGATIVE 09/09/2017 0959   LEUKOCYTESUR NEGATIVE 09/09/2017 0959   Sepsis Labs: _1 (procalcitonin:4,lacticidven:4)  ) Recent Results (from the past 240 hour(s))  Culture, respiratory (NON-Expectorated)     Status: None   Collection Time: 09/05/17  4:00 PM  Result Value Ref Range Status   Specimen Description TRACHEAL ASPIRATE  Final   Special Requests NONE  Final   Gram Stain   Final    FEW WBC PRESENT, PREDOMINANTLY PMN RARE SQUAMOUS EPITHELIAL CELLS PRESENT NO ORGANISMS SEEN    Culture   Final    Consistent with normal respiratory flora. Performed at [facility], [place] [address], [place], [place] [address]    Report Status 09/08/2017 FINAL  Final  C difficile quick scan w PCR reflex     Status: None   Collection Time: 09/08/17  9:20 AM  Result Value Ref Range Status   C Diff antigen NEGATIVE NEGATIVE Final   C Diff toxin NEGATIVE NEGATIVE Final   C Diff interpretation No C. difficile detected.  Final    Comment: Performed at [facility], [place] [address], [place], [place] [address]  Culture, respiratory (NON-Expectorated)     Status: None   Collection Time: 09/08/17  2:36 PM  Result Value Ref Range Status   Specimen Description TRACHEAL ASPIRATE  Final   Special Requests NONE  Final   Gram Stain   Final    MODERATE WBC PRESENT,BOTH PMN AND MONONUCLEAR RARE SQUAMOUS EPITHELIAL CELLS PRESENT NO ORGANISMS SEEN    Culture   Final    RARE Consistent with normal respiratory flora. Performed at [facility], [place] [address] [place], [place] [address]     Report Status 09/10/2017 FINAL  Final  Culture, blood (routine x 2)     Status: None   Collection Time: 09/09/17  3:51 AM  Result Value Ref Range Status   Specimen Description BLOOD LEFT HAND  Final   Special Requests   Final    BOTTLES DRAWN AEROBIC ONLY Blood Culture adequate volume   Culture   Final    NO GROWTH 5 DAYS Performed at [facility], [place] [address], [place], [place] [address]    Report Status 09/14/2017 FINAL  Final  Culture, blood (routine x 2)     Status: None   Collection Time: 09/09/17  3:52 AM  Result Value Ref Range Status   Specimen Description BLOOD LEFT HAND  Final   Special Requests   Final    BOTTLES DRAWN AEROBIC ONLY Blood Culture adequate volume   Culture   Final    NO GROWTH 5 DAYS Performed at [facility], [place] [address], [place], [place] [address]    Report Status 09/14/2017 FINAL  Final         Radiology Studies: No results found.      Scheduled Meds: . chlorhexidine  15 mL Mouth Rinse BID  . enoxaparin (LOVENOX) injection  40 mg Subcutaneous Q24H  . feeding supplement (PRO-STAT SUGAR FREE 64)  30 mL Per Tube Daily  . FLUoxetine  20 mg Per Tube QPC breakfast  . free water  30-60 mL Per Tube Q8H  . Gerhardt's butt cream   Topical BID  . insulin aspart  0-20 Units Subcutaneous Q4H  . insulin aspart  4 Units Subcutaneous Q4H  . insulin glargine  18 Units Subcutaneous QHS  . mouth rinse  15 mL Mouth Rinse BID  . mouth rinse  15 mL Mouth Rinse q12n4p  . metoprolol tartrate  50 mg Per Tube BID  . multivitamin  1 tablet Oral Daily  . pantoprazole sodium  40 mg Per Tube BID  . rosuvastatin  10 mg Per Tube q1800  . traZODone  100 mg Per Tube QHS   Continuous Infusions: . sodium chloride 10 mL/hr at 09/11/17 1800  . feeding supplement (GLUCERNA 1.2 CAL) 1,000 mL (09/14/17 1506)     LOS: 22 days    Time spent: 2mn    PDomenic Polite MD [facility] Page via [email or web address], password [email or web address] After 7PM please  contact night-coverage  09/15/2017, 1:31 PM

## 2017-09-16 ENCOUNTER — Inpatient Hospital Stay (HOSPITAL_COMMUNITY): Payer: Medicare PPO

## 2017-09-16 LAB — GLUCOSE, CAPILLARY
GLUCOSE-CAPILLARY: 158 mg/dL — AB (ref 65–99)
GLUCOSE-CAPILLARY: 152 mg/dL — AB (ref 65–99)
GLUCOSE-CAPILLARY: 144 mg/dL — AB (ref 65–99)
GLUCOSE-CAPILLARY: 262 mg/dL — AB (ref 65–99)
GLUCOSE-CAPILLARY: 142 mg/dL — AB (ref 65–99)
Glucose-Capillary: 156 mg/dL — ABNORMAL HIGH (ref 65–99)
Glucose-Capillary: 219 mg/dL — ABNORMAL HIGH (ref 65–99)

## 2017-09-16 LAB — CBC
HCT: 32.4 % — ABNORMAL LOW (ref 36.0–46.0)
Hemoglobin: 9.7 g/dL — ABNORMAL LOW (ref 12.0–15.0)
MCH: 27.6 pg (ref 26.0–34.0)
MCHC: 29.9 g/dL — ABNORMAL LOW (ref 30.0–36.0)
MCV: 92 fL (ref 78.0–100.0)
PLATELETS: 657 10*3/uL — AB (ref 150–400)
RBC: 3.52 MIL/uL — AB (ref 3.87–5.11)
RDW: 17.5 % — AB (ref 11.5–15.5)
WBC: 11.4 10*3/uL — ABNORMAL HIGH (ref 4.0–10.5)

## 2017-09-16 LAB — BASIC METABOLIC PANEL
Anion gap: 12 (ref 5–15)
BUN: 24 mg/dL — AB (ref 6–20)
CALCIUM: 9.6 mg/dL (ref 8.9–10.3)
CO2: 27 mmol/L (ref 22–32)
CREATININE: 0.73 mg/dL (ref 0.44–1.00)
Chloride: 106 mmol/L (ref 101–111)
GFR calc Af Amer: >60 mL/min (ref >60)
GLUCOSE: 146 mg/dL — AB (ref 65–99)
POTASSIUM: 3.4 mmol/L — AB (ref 3.5–5.1)
SODIUM: 145 mmol/L (ref 135–145)

## 2017-09-16 MED ORDER — RESOURCE THICKENUP CLEAR PO POWD
ORAL | Status: DC | PRN
Start: 2017-09-16 — End: 2017-09-18
  Filled 2017-09-16: qty 125

## 2017-09-16 NOTE — Progress Notes (Signed)
Occupational Therapy Treatment Patient Details Name: Holly Figueroa MRN: [id number] DOB: 05-24-1966 Today's Date: 09/16/2017    History of present illness Pt. is a 52 y.o. F with significant PMH of mitral stenosis, DM type II, CHF, who was admitted with ARDS from influenza B pneumonia complicated by pulmonary edema and prolonged respiratory failure. Patient was intubated 3/2-3/21.      OT comments  Pt making progress towards OTs, presents sitting up in recliner agreeable to treatment session. Pt completing room level functional mobility and standing ADLs with overall minA (+2 for safety). Continues to demonstrate decreased awareness and mild impulsivity requiring verbal safety cues during session. Feel Pt remains appropriate for CIR level services. Will continue to follow acutely to progress pt towards established OT goals.   Follow Up Recommendations  CIR;Supervision/Assistance - 24 hour    Equipment Recommendations  Other (comment)(TBD in next venue )          Precautions / Restrictions Precautions Precautions: Fall Restrictions Weight Bearing Restrictions: No       Mobility Bed Mobility               General bed mobility comments: OOB in recliner upon arrival   Transfers Overall transfer level: Needs assistance Equipment used: 1 person hand held assist Transfers: Sit to/from Stand Sit to Stand: Min assist;+2 safety/equipment         General transfer comment: assist to rise and steady, HHA provided for increased stability     Balance Overall balance assessment: Needs assistance Sitting-balance support: Feet supported;No upper extremity supported Sitting balance-Leahy Scale: Good     Standing balance support: Bilateral upper extremity supported;During functional activity Standing balance-Leahy Scale: Fair Standing balance comment: pt maintaining static standing at sink with MinA and intermittently minGuard                            ADL  either performed or assessed with clinical judgement   ADL Overall ADL's : Needs assistance/impaired     Grooming: Wash/dry face;Wash/dry hands;Minimal assistance;Standing   Upper Body Bathing: Minimal assistance;Standing Upper Body Bathing Details (indicate cue type and reason): pt washing underarms while standing at sink with MinA     Upper Body Dressing : Minimal assistance;Sitting Upper Body Dressing Details (indicate cue type and reason): doffing/donning new gown                  Functional mobility during ADLs: Minimal assistance;+2 for safety/equipment General ADL Comments: pt completing short distance functional mobility with Min HHA to bathroom, completing standing grooming ADLs with overall minA                       Cognition Arousal/Alertness: Awake/alert Behavior During Therapy: Impulsive Overall Cognitive Status: No family/caregiver present to determine baseline cognitive functioning Area of Impairment: Memory;Following commands;Safety/judgement;Awareness;Problem solving                     Memory: Decreased short-term memory Following Commands: Follows one step commands consistently Safety/Judgement: Decreased awareness of safety;Decreased awareness of deficits Awareness: Emergent Problem Solving: Requires verbal cues General Comments: pt requires verbal safety cues, following one step commands consistently during session                           Pertinent Vitals/ Pain       Pain Assessment: No/denies pain  Frequency  Min 2X/week        Progress Toward Goals  OT Goals(current goals can now be found in the care plan section)  Progress towards OT goals: Progressing toward goals  Acute Rehab OT Goals Patient Stated Goal: get moving OT Goal Formulation: With patient Time For Goal Achievement: 09/28/17 Potential to Achieve Goals: Good  Plan  Discharge plan remains appropriate                     AM-PAC PT "6 Clicks" Daily Activity     Outcome Measure   Help from another person eating meals?: Total(NPO) Help from another person taking care of personal grooming?: A Little Help from another person toileting, which includes using toliet, bedpan, or urinal?: A Little Help from another person bathing (including washing, rinsing, drying)?: A Lot Help from another person to put on and taking off regular upper body clothing?: A Little Help from another person to put on and taking off regular lower body clothing?: A Lot 6 Click Score: 14    End of Session Equipment Utilized During Treatment: Gait belt  OT Visit Diagnosis: Unsteadiness on feet (R26.81);Other abnormalities of gait and mobility (R26.89);Muscle weakness (generalized) (M62.81);Cognitive communication deficit (R41.841)   Activity Tolerance Patient tolerated treatment well   Patient Left in chair;with call bell/phone within reach;with chair alarm set   Nurse Communication Mobility status        Time: 9024-0973 OT Time Calculation (min): 15 min  Charges: OT General Charges $OT Visit: 1 Visit OT Treatments $Self Care/Home Management : 8-22 mins  Lou Cal, OT Pager [phone] 09/16/2017    Raymondo Band 09/16/2017, 2:09 PM

## 2017-09-16 NOTE — Progress Notes (Signed)
I met with pt at bedside to begin discussions on a possible inpt rehab admission. Noted Tele sitter in use. Pt requesting cortrak removal. Noted MBS on 3/21. I await therapy progress updates from today, then I will begin insurance authorization for a possible inpt rehab admit. I will contact her spouse today. [phone]

## 2017-09-16 NOTE — Progress Notes (Signed)
  Speech Language Pathology Treatment: Dysphagia  Patient Details Name: Holly Figueroa MRN: [id number] DOB: 26-Jan-1966 Today's Date: 09/16/2017 Time: 0962-8366 SLP Time Calculation (min) (ACUTE ONLY): 10 min  Assessment / Plan / Recommendation Clinical Impression  Pt demonstrates improving mentation and ability to participate in Po trials She reports she has no recollection of MBS last week in which she orally held most trials attempted. Today pt is quite automatic and able to participate with all interventions. Trials of thin, nectar and puree offered, all with at least slightly wet vocal quality after bites/sips also intermittent spontaneous throat clearing observed. Vocal quality is hoarse suggesting ongoing impairment of laryngeal tissues. Recommend repeat MBS for expected diet advancement with recommendations for modified textures or strategies as needed. Pt agrees to participate.   HPI HPI: Holly Redel Aldersonis a 52 y.o.femalewith medical history significant ofmitral stenosis, DM type II, CHF last EF 65-70% with grade 1 diastolic dysfunction, mulltifocal PNA 07/2017 presents with worsening dyspnea. Intubated 3/2-3/21. CXR with bilateral infiltrates read as pulm edema. BSE 10/2014 with acute reversible dysphagia after intubation and recommended full liquid diet. MBS recommended following BSE.      SLP Plan  MBS       Recommendations  Diet recommendations: NPO                Plan: MBS       GO               Herbie Baltimore, MA CCC-SLP [phone]  Lynann Beaver 09/16/2017, 11:00 AM

## 2017-09-16 NOTE — Progress Notes (Addendum)
PROGRESS NOTE    Holly Figueroa  COB:[id number] DOB: 1965-11-29 DOA: 08/24/2017 PCP: [place] Police, MD  /02 Admit with SOB, vomited on BiPAP >intubated, ARDS  3/03 Placed on paralytics, requiring pressors 3/05 PRBC x1. Paralytics stopped 3/07 Off pressors. 3/08 Bronch >>unexpectedly showed focal bleeding from right upper lobe, anterior segment 3/09 Reparalysed, proned 3/10 placed supine 3/11 NMB stopped.  3/13 worsening hypoxia, nimbex resumed 3/14 placing PA catheter and then dissipating transition to [facility] 3/15 TEE, nimbex d/c'ed >  3/17 Bronch: diffuse bronchitis, bloody secretions 3/20 extubated   Brief Narrative: PCCM transferred to [facility] 3/22 Briefly this is a 52 year old female with history of severe rheumatic mitral stenosis, remote mitral valvotomy 2012 at [facility] admitted to the critical care service on 3/2 with severe hypoxemia, ARDS from influenza B pneumonia complicated by prolonged respiratory failure, was intubated for almost 19 days, finally extubated 3/20 -hospitalization complicated by healthcare associated pneumonia, BAL was positive for Candida -Finally improving, transferred to [facility] into the floor 3/22 -Failed swallow evaluation, recommended to have short-term alternate method of nutrition  -awaiting repeat swallowing assessment  Assessment & Plan:   Active Problems:   Acute respiratory failure (HCC) -complicated hospitalization with prolonged respiratory failure requiring mechanical ventilation -Intubated 3/2 and extubated 3/20 -resolved -Influenza B positive, completed course of Tamiflu -Blood cultures negative, strep pneumo antigen was negative tracheal aspirate negative -BAL on 3/8 with Candida tropicalis and glabrata -will complete 7days of  anidulafungin today  Dysphagia -Suspected to be secondary to prolonged mechanical ventilation -Expect this to improve, SLP evaluation and modified barium swallow from 3/21 noted -Cortrak tube replaced  3/22 , tolerating tube feeds and meds per tube -awaiting repeat swallowing assessment today -clinically she is so much better, expect swallowing to do so too  Severe sepsis -Resolved  Type 2 diabetes mellitus -metformin on hold, Continue current dose of Lantus, sliding scale insulin for now  Chronic diastolic CHF -clinically euvolemic at this time, 9L negative -Monitor urine output and B met daily -stopped IV fluids  Rheumatic valcular dz/Moderate mitral stenosis and aortic stenosis -Per TEE 3/15 -Appreciate cards input, hold diuretics at this time -Assess need for diuretics on a daily basis, continue metoprolol -Follow-up with cardiology  DVT prophylaxis: Lovenox Code Status: full code Family Communication:no family at bedside Disposition Plan: hopeful for CIR this week  Consultants:   PCCM  Cards   Procedures:   Antimicrobials:    Subjective: -wants Cortrak out, wants food, waiting for SLP  Objective: Vitals:   09/15/17 0400 09/15/17 1406 09/15/17 2023 09/16/17 0432  BP: 128/62 (!) 114/53 138/73 121/61  Pulse: 99 80 89 95  Resp: _0 Temp: 98.6 F (37 C) 98.8 F (37.1 C) 98.1 F (36.7 C) 98.9 F (37.2 C)  TempSrc: Oral Oral Oral Oral  SpO2: 96% 93% 95% 91%  Weight:      Height:        Intake/Output Summary (Last 24 hours) at 09/16/2017 1308 Last data filed at 09/16/2017 1243 Gross per 24 hour  Intake 1018.33 ml  Output -  Net 1018.33 ml   Filed Weights   09/10/17 0502 09/11/17 0500 09/12/17 0500  Weight: 83 kg (182 lb 15.7 oz) 81.1 kg (178 lb 12.7 oz) 74 kg (163 lb 2.3 oz)    Examination:  Gen: chronically ill female, in  Good spirits , AAO 2, no distress HEENT: PERRLA, Neck supple, no JVD Lungs: improved air movement, clear bilaterally CVS: S1-S2/regular rate rhythm, systolic ejection murmur and  diastolic murmur noted Abd: soft, Non tender, non distended, BS present Extremities: No Cyanosis, Clubbing or edema Skin: no new  rashes Psychiatry: Judgement and insight appear normal. Mood & affect appropriate.     Data Reviewed:   CBC: Recent Labs  Lab 09/10/17 0235 09/11/17 0249 09/11/17 1022 09/13/17 0502 09/16/17 0405  WBC 15.0* 11.9* 14.1* 16.1* 11.4*  NEUTROABS  --   --  11.6*  --   --   HGB 10.0* 8.4* 10.0* 10.1* 9.7*  HCT 33.0* 28.0* 32.5* 33.6* 32.4*  MCV 89.4 90.9 89.0 89.1 92.0  PLT 441* 554* 642* 843* 462*   Basic Metabolic Panel: Recent Labs  Lab 09/10/17 0235 09/11/17 0249 09/12/17 0251 09/13/17 0502 09/16/17 0405  NA 139 136 137 137 145  K 3.4* 4.1 3.0* 3.8 3.4*  CL 95* 90* 91* 97* 106  CO2 _0 21* 27  GLUCOSE 152* 86 144* 148* 146*  BUN 20 24* 24* 21* 24*  CREATININE 0.65 0.74 0.92 0.85 0.73  CALCIUM 8.9 9.3 9.7 9.7 9.6  MG 1.5* 2.2  --  2.1  --   PHOS 3.2 4.4  --  3.1  --    GFR: Estimated Creatinine Clearance: 86.6 mL/min (by C-G formula based on SCr of 0.73 mg/dL). Liver Function Tests: No results for input(s): AST, ALT, ALKPHOS, BILITOT, PROT, ALBUMIN in the last 168 hours. No results for input(s): LIPASE, AMYLASE in the last 168 hours. No results for input(s): AMMONIA in the last 168 hours. Coagulation Profile: Recent Labs  Lab 09/10/17 2012  INR 1.19   Cardiac Enzymes: No results for input(s): CKTOTAL, CKMB, CKMBINDEX, TROPONINI in the last 168 hours. BNP (last 3 results) No results for input(s): PROBNP in the last 8760 hours. HbA1C: No results for input(s): HGBA1C in the last 72 hours. CBG: Recent Labs  Lab 09/15/17 1951 09/16/17 0031 09/16/17 0426 09/16/17 0757 09/16/17 1147  GLUCAP 158* 156* 152* 144* 262*   Lipid Profile: No results for input(s): CHOL, HDL, LDLCALC, TRIG, CHOLHDL, LDLDIRECT in the last 72 hours. Thyroid Function Tests: No results for input(s): TSH, T4TOTAL, FREET4, T3FREE, THYROIDAB in the last 72 hours. Anemia Panel: No results for input(s): VITAMINB12, FOLATE, FERRITIN, TIBC, IRON, RETICCTPCT in the last 72  hours. Urine analysis:    Component Value Date/Time   COLORURINE STRAW (A) 09/09/2017 0959   APPEARANCEUR CLEAR 09/09/2017 0959   LABSPEC 1.008 09/09/2017 0959   PHURINE 7.0 09/09/2017 0959   GLUCOSEU NEGATIVE 09/09/2017 0959   HGBUR SMALL (A) 09/09/2017 0959   BILIRUBINUR NEGATIVE 09/09/2017 0959   KETONESUR NEGATIVE 09/09/2017 0959   PROTEINUR NEGATIVE 09/09/2017 0959   UROBILINOGEN 0.2 10/28/2014 2330   NITRITE NEGATIVE 09/09/2017 0959   LEUKOCYTESUR NEGATIVE 09/09/2017 0959   Sepsis Labs: _1 (procalcitonin:4,lacticidven:4)  ) Recent Results (from the past 240 hour(s))  C difficile quick scan w PCR reflex     Status: None   Collection Time: 09/08/17  9:20 AM  Result Value Ref Range Status   C Diff antigen NEGATIVE NEGATIVE Final   C Diff toxin NEGATIVE NEGATIVE Final   C Diff interpretation No C. difficile detected.  Final    Comment: Performed at [facility], [place] [address], [place], [place] [address]  Culture, respiratory (NON-Expectorated)     Status: None   Collection Time: 09/08/17  2:36 PM  Result Value Ref Range Status   Specimen Description TRACHEAL ASPIRATE  Final   Special Requests NONE  Final   Gram Stain  Final    MODERATE WBC PRESENT,BOTH PMN AND MONONUCLEAR RARE SQUAMOUS EPITHELIAL CELLS PRESENT NO ORGANISMS SEEN    Culture   Final    RARE Consistent with normal respiratory flora. Performed at [facility], [place] [address] [place], [place] [address]    Report Status 09/10/2017 FINAL  Final  Culture, blood (routine x 2)     Status: None   Collection Time: 09/09/17  3:51 AM  Result Value Ref Range Status   Specimen Description BLOOD LEFT HAND  Final   Special Requests   Final    BOTTLES DRAWN AEROBIC ONLY Blood Culture adequate volume   Culture   Final    NO GROWTH 5 DAYS Performed at [facility], [place] [address] [place], [place] [address]    Report Status 09/14/2017 FINAL  Final  Culture, blood (routine x 2)      Status: None   Collection Time: 09/09/17  3:52 AM  Result Value Ref Range Status   Specimen Description BLOOD LEFT HAND  Final   Special Requests   Final    BOTTLES DRAWN AEROBIC ONLY Blood Culture adequate volume   Culture   Final    NO GROWTH 5 DAYS Performed at [facility], [place] [address], [place], [place] [address]    Report Status 09/14/2017 FINAL  Final         Radiology Studies: No results found.      Scheduled Meds: . chlorhexidine  15 mL Mouth Rinse BID  . enoxaparin (LOVENOX) injection  40 mg Subcutaneous Q24H  . feeding supplement (PRO-STAT SUGAR FREE 64)  30 mL Per Tube Daily  . FLUoxetine  20 mg Per Tube QPC breakfast  . free water  30-60 mL Per Tube Q8H  . Gerhardt's butt cream   Topical BID  . insulin aspart  0-20 Units Subcutaneous Q4H  . insulin aspart  4 Units Subcutaneous Q4H  . insulin glargine  22 Units Subcutaneous QHS  . mouth rinse  15 mL Mouth Rinse BID  . mouth rinse  15 mL Mouth Rinse q12n4p  . metoprolol tartrate  50 mg Per Tube BID  . multivitamin  1 tablet Oral Daily  . pantoprazole sodium  40 mg Per Tube BID  . rosuvastatin  10 mg Per Tube q1800  . traZODone  100 mg Per Tube QHS   Continuous Infusions: . sodium chloride 10 mL/hr at 09/11/17 1800  . feeding supplement (GLUCERNA 1.2 CAL) 1,000 mL (09/16/17 0103)     LOS: 23 days    Time spent: 41mn    PDomenic Polite MD [facility] Page via [email or web address], password [email or web address] After 7PM please contact night-coverage  09/16/2017, 1:08 PM

## 2017-09-16 NOTE — Progress Notes (Signed)
Modified Barium Swallow Progress Note  Patient Details  Name: Holly Figueroa MRN: [id number] Date of Birth: 03-Jun-1966  Today's Date: 09/16/2017  Modified Barium Swallow completed.  Full report located under Chart Review in the Imaging Section.  Brief recommendations include the following:  Clinical Impression  Pt demonstrates improvement in mentation which impacted abitliy to follow directions and tolerate testing. She continues to demosntrate a moderate orpharygneal dysphagia following prolonged intubation; the pt silently aspirates thin liquids during the swallow due to decreased integrity of laryngeal closure. Small single sips are tolerated well but sips of any larger quantity are aspirated without sensation. A chin tuck was not beneficial. Nectar thick liquids can be consumed freely without restriction or strategy needed. Recommend pt initaite a dys 3 (mech soft) diet with nectar thick liquids with potential upgrade via MBS as vocal quality improves. Will f/u to monitor for progress and also teach pt use of thickener.    Swallow Evaluation Recommendations       SLP Diet Recommendations: Dysphagia 3 (Mech soft) solids;Nectar thick liquid   Liquid Administration via: Cup;Straw   Medication Administration: Via alternative means   Supervision: Patient able to self feed       Postural Changes: Remain semi-upright after after feeds/meals (Comment)   Oral Care Recommendations: Oral care BID   Other Recommendations: Order thickener from [place], [place] CCC-SLP [phone]  Lynann Beaver 09/16/2017,2:26 PM

## 2017-09-16 NOTE — Progress Notes (Signed)
Physical Therapy Treatment Patient Details Name: Holly Figueroa MRN: [id number] DOB: 1966/06/12 Today's Date: 09/16/2017    History of Present Illness Pt. is a 52 y.o. F with significant PMH of mitral stenosis, DM type II, CHF, who was admitted with ARDS from influenza B pneumonia complicated by pulmonary edema and prolonged respiratory failure. Patient was intubated 3/2-3/21.       PT Comments    Pt progressing towards physical therapy goals.  Was able to ambulate in hall with +1 assist today, however continues to require assist for balance support and safety. Tolerance for functional activity remains low, and feel pt could benefit from a RW for energy conservation as well as to improve balance. Meal tray came during session and pt was unable to focus on anything else so treatment was ended. Will continue to follow and progress as able per POC.   Follow Up Recommendations  CIR     Equipment Recommendations  None recommended by PT(TBD by next venue of care)    Recommendations for Other Services       Precautions / Restrictions Precautions Precautions: Fall Restrictions Weight Bearing Restrictions: No    Mobility  Bed Mobility               General bed mobility comments: OOB in recliner upon arrival   Transfers Overall transfer level: Needs assistance Equipment used: 1 person hand held assist Transfers: Sit to/from Stand Sit to Stand: Min assist         General transfer comment: Assist to power-up to full standing position. Pt required increased time to gain/maintain standing balance.   Ambulation/Gait Ambulation/Gait assistance: Min assist Ambulation Distance (Feet): 100 Feet Assistive device: (IV pole) Gait Pattern/deviations: Step-through pattern;Decreased stride length;Trunk flexed;Narrow base of support Gait velocity: Decreased Gait velocity interpretation: Below normal speed for age/gender General Gait Details: Slow and unsteady gait. Pt holding IV  pole for BUE support. Hands-on assist at gait belt throughout gait training.    Stairs            Wheelchair Mobility    Modified Rankin (Stroke Patients Only)       Balance Overall balance assessment: Needs assistance Sitting-balance support: Feet supported;No upper extremity supported Sitting balance-Leahy Scale: Good     Standing balance support: Bilateral upper extremity supported;During functional activity Standing balance-Leahy Scale: Fair Standing balance comment: pt maintaining static standing at sink with MinA and intermittently minGuard                             Cognition Arousal/Alertness: Awake/alert Behavior During Therapy: WFL for tasks assessed/performed Overall Cognitive Status: No family/caregiver present to determine baseline cognitive functioning Area of Impairment: Memory;Safety/judgement;Awareness;Problem solving                     Memory: Decreased short-term memory Following Commands: Follows one step commands consistently Safety/Judgement: Decreased awareness of safety;Decreased awareness of deficits Awareness: Emergent Problem Solving: Requires verbal cues General Comments: pt requires verbal safety cues, following one step commands consistently during session       Exercises x10 LAQ bilateral LE's x10 seated marching bilateral LE's x10 heel raises in sitting bilateral LE's    General Comments        Pertinent Vitals/Pain Pain Assessment: Faces Faces Pain Scale: Hurts a little bit Pain Location: Generalized Pain Descriptors / Indicators: Discomfort Pain Intervention(s): Limited activity within patient's tolerance;Monitored during session;Repositioned    Home Living  Prior Function            PT Goals (current goals can now be found in the care plan section) Acute Rehab PT Goals Patient Stated Goal: get moving Progress towards PT goals: Progressing toward goals     Frequency    Min 3X/week      PT Plan Current plan remains appropriate    Co-evaluation              AM-PAC PT "6 Clicks" Daily Activity  Outcome Measure  Difficulty turning over in bed (including adjusting bedclothes, sheets and blankets)?: Unable Difficulty moving from lying on back to sitting on the side of the bed? : Unable Difficulty sitting down on and standing up from a chair with arms (e.g., wheelchair, bedside commode, etc,.)?: A Lot Help needed moving to and from a bed to chair (including a wheelchair)?: A Little Help needed walking in hospital room?: A Little Help needed climbing 3-5 steps with a railing? : Total 6 Click Score: 11    End of Session Equipment Utilized During Treatment: Gait belt Activity Tolerance: Patient tolerated treatment well Patient left: in chair;with call bell/phone within reach;with chair alarm set;with family/visitor present Nurse Communication: Mobility status PT Visit Diagnosis: Unsteadiness on feet (R26.81);Muscle weakness (generalized) (M62.81);Difficulty in walking, not elsewhere classified (R26.2)     Time: 2878-6767 PT Time Calculation (min) (ACUTE ONLY): 16 min  Charges:  $Gait Training: 8-22 mins                    G Codes:       Holly Figueroa, PT, DPT Acute Rehabilitation Services Pager: [phone]    Holly Figueroa 09/16/2017, 3:15 PM

## 2017-09-17 LAB — GLUCOSE, CAPILLARY
Glucose-Capillary: 137 mg/dL — ABNORMAL HIGH (ref 65–99)
Glucose-Capillary: 145 mg/dL — ABNORMAL HIGH (ref 65–99)
Glucose-Capillary: 151 mg/dL — ABNORMAL HIGH (ref 65–99)
Glucose-Capillary: 175 mg/dL — ABNORMAL HIGH (ref 65–99)
Glucose-Capillary: 193 mg/dL — ABNORMAL HIGH (ref 65–99)
Glucose-Capillary: 235 mg/dL — ABNORMAL HIGH (ref 65–99)

## 2017-09-17 NOTE — Care Management Note (Addendum)
Case Management Note  Patient Details  Name: Holly Figueroa MRN: [id number] Date of Birth: 08/04/1965  Subjective/Objective:    Sepsis, CHF, Dysphagia, Acute resp failure.                Action/Plan: Chart reviewed. Scheduled dc to IP rehab once approved. Contacted IP rehab Coordinator to follow up.   Pt refused IP rehab. Offered choice for HHPT/SLP/list provided. Pt agreeable to [facility] for [facility] with new referral.   Pt lives in [place], will need to arrange with [facility] agency in [place].    Expected Discharge Date:  09/18/2017               Expected Discharge Plan:  IP Rehab Facility  In-House Referral:  Clinical Social Work  Discharge planning Services  CM Consult  Post Acute Care Choice:  NA Choice offered to:  NA  DME Arranged:  N/A DME Agency:  NA  HH Arranged:  NA HH Agency:  NA  Status of Service:  Completed, signed off  If discussed at [employer], dates discussed:    Additional Comments:  Erenest Rasher, RN 09/17/2017, 3:05 PM

## 2017-09-17 NOTE — Progress Notes (Signed)
PROGRESS NOTE    Holly Figueroa  EEF:[id number] DOB: Nov 05, 1965 DOA: 08/24/2017 PCP: [place] Police, MD  /02 Admit with SOB, vomited on BiPAP >intubated, ARDS  3/03 Placed on paralytics, requiring pressors 3/05 PRBC x1. Paralytics stopped 3/07 Off pressors. 3/08 Bronch >>unexpectedly showed focal bleeding from right upper lobe, anterior segment 3/09 Reparalysed, proned 3/10 placed supine 3/11 NMB stopped.  3/13 worsening hypoxia, nimbex resumed 3/14 placing PA catheter and then dissipating transition to [facility] 3/15 TEE, nimbex d/c'ed >  3/17 Bronch: diffuse bronchitis, bloody secretions 3/20 extubated   Brief Narrative: PCCM transferred to [facility] 3/22 Briefly this is a 52 year old female with history of severe rheumatic mitral stenosis, remote mitral valvotomy 2012 at [place] admitted to the critical care service on 3/2 with severe hypoxemia, ARDS from influenza B pneumonia complicated by prolonged respiratory failure, was intubated for almost 19 days, finally extubated 3/20 -hospitalization complicated by healthcare associated pneumonia, BAL was positive for Candida -Finally improving, transferred to [facility] into the floor 3/22 -Failed swallow evaluation, recommended to have short-term alternate method of nutrition  -awaiting repeat swallowing assessment  Assessment & Plan:   Active Problems:   Acute respiratory failure (HCC) -complicated hospitalization with prolonged respiratory failure requiring mechanical ventilation -Intubated 3/2 and extubated 3/20 -resolved -Influenza B positive, completed course of Tamiflu -Blood cultures negative, strep pneumo antigen was negative tracheal aspirate negative -BAL on 3/8 with Candida tropicalis and glabrata -completed 7days of anidulafungin therapy 3/26 -CIR for discharge, medically stable  Dysphagia -Suspected to be secondary to prolonged mechanical ventilation -Expect this to improve, SLP evaluation and modified barium swallow  from 3/21 noted -Cortrak tube replaced 3/22 , tolerating tube feeds and meds per tube -s/p repeat MBS 3/25 improving, started D3 diet with thickened liquids -We'll remove cortrak today -discharge planning  Severe sepsis -Resolved  Type 2 diabetes mellitus -metformin on hold, Continue current dose of Lantus, sliding scale insulin for now  Chronic diastolic CHF -clinically euvolemic at this time, 9L negative -Monitor urine output and B met daily -Lasix when necessary discharge to rehabilitation  Rheumatic valcular dz/Moderate mitral stenosis and aortic stenosis -Per TEE 3/15 -Appreciate cards input, hold diuretics at this time -Assess need for diuretics on a daily basis, continue metoprolol -Follow-up with cardiology  DVT prophylaxis: Lovenox Code Status: full code Family Communication:no family at bedside Disposition Plan: discharge to CIR today if bed available  Consultants:   PCCM  Cards   Procedures:   Antimicrobials:    Subjective: -tolerating dysphagia 3 diet and thickened liquids -Denies any dyspnea -Quite deconditioned but mobility improving as well  Objective: Vitals:   09/16/17 0432 09/16/17 1432 09/16/17 2030 09/17/17 0508  BP: 121/61 139/66 117/66 124/67  Pulse: 95 76 90 85  Resp: _0 Temp: 98.9 F (37.2 C) 98.9 F (37.2 C) 98.9 F (37.2 C) 98.3 F (36.8 C)  TempSrc: Oral Oral Oral Oral  SpO2: 91% 98% 94% 93%  Weight:      Height:        Intake/Output Summary (Last 24 hours) at 09/17/2017 1326 Last data filed at 09/17/2017 0805 Gross per 24 hour  Intake 2062.66 ml  Output 25 ml  Net 2037.66 ml   Filed Weights   09/10/17 0502 09/11/17 0500 09/12/17 0500  Weight: 83 kg (182 lb 15.7 oz) 81.1 kg (178 lb 12.7 oz) 74 kg (163 lb 2.3 oz)    Examination:  Gen: chronically ill female, appears much older than stated age, in good spirits, no distress,  HEENT: central line removed, ngt noted Lungs: Good air movement bilaterally,  CTAB CVS: S1-S2/regular rate rhythm, systolic ejection murmur, and question diastolic murmur noted Abd: soft, Non tender, non distended, BS present Extremities: No Cyanosis, Clubbing or edema Skin: no new rashes Psychiatry: Judgement and insight appear normal. Mood & affect appropriate.     Data Reviewed:   CBC: Recent Labs  Lab 09/11/17 0249 09/11/17 1022 09/13/17 0502 09/16/17 0405  WBC 11.9* 14.1* 16.1* 11.4*  NEUTROABS  --  11.6*  --   --   HGB 8.4* 10.0* 10.1* 9.7*  HCT 28.0* 32.5* 33.6* 32.4*  MCV 90.9 89.0 89.1 92.0  PLT 554* 642* 843* 537*   Basic Metabolic Panel: Recent Labs  Lab 09/11/17 0249 09/12/17 0251 09/13/17 0502 09/16/17 0405  NA 136 137 137 145  K 4.1 3.0* 3.8 3.4*  CL 90* 91* 97* 106  CO2 31 30 21* 27  GLUCOSE 86 144* 148* 146*  BUN 24* 24* 21* 24*  CREATININE 0.74 0.92 0.85 0.73  CALCIUM 9.3 9.7 9.7 9.6  MG 2.2  --  2.1  --   PHOS 4.4  --  3.1  --    GFR: Estimated Creatinine Clearance: 86.6 mL/min (by C-G formula based on SCr of 0.73 mg/dL). Liver Function Tests: No results for input(s): AST, ALT, ALKPHOS, BILITOT, PROT, ALBUMIN in the last 168 hours. No results for input(s): LIPASE, AMYLASE in the last 168 hours. No results for input(s): AMMONIA in the last 168 hours. Coagulation Profile: Recent Labs  Lab 09/10/17 2012  INR 1.19   Cardiac Enzymes: No results for input(s): CKTOTAL, CKMB, CKMBINDEX, TROPONINI in the last 168 hours. BNP (last 3 results) No results for input(s): PROBNP in the last 8760 hours. HbA1C: No results for input(s): HGBA1C in the last 72 hours. CBG: Recent Labs  Lab 09/16/17 2033 09/17/17 0027 09/17/17 0435 09/17/17 0801 09/17/17 1201  GLUCAP 142* 175* 137* 151* 235*   Lipid Profile: No results for input(s): CHOL, HDL, LDLCALC, TRIG, CHOLHDL, LDLDIRECT in the last 72 hours. Thyroid Function Tests: No results for input(s): TSH, T4TOTAL, FREET4, T3FREE, THYROIDAB in the last 72 hours. Anemia  Panel: No results for input(s): VITAMINB12, FOLATE, FERRITIN, TIBC, IRON, RETICCTPCT in the last 72 hours. Urine analysis:    Component Value Date/Time   COLORURINE STRAW (A) 09/09/2017 0959   APPEARANCEUR CLEAR 09/09/2017 0959   LABSPEC 1.008 09/09/2017 0959   PHURINE 7.0 09/09/2017 0959   GLUCOSEU NEGATIVE 09/09/2017 0959   HGBUR SMALL (A) 09/09/2017 0959   BILIRUBINUR NEGATIVE 09/09/2017 0959   KETONESUR NEGATIVE 09/09/2017 0959   PROTEINUR NEGATIVE 09/09/2017 0959   UROBILINOGEN 0.2 10/28/2014 2330   NITRITE NEGATIVE 09/09/2017 0959   LEUKOCYTESUR NEGATIVE 09/09/2017 0959   Sepsis Labs: _0 (procalcitonin:4,lacticidven:4)  ) Recent Results (from the past 240 hour(s))  C difficile quick scan w PCR reflex     Status: None   Collection Time: 09/08/17  9:20 AM  Result Value Ref Range Status   C Diff antigen NEGATIVE NEGATIVE Final   C Diff toxin NEGATIVE NEGATIVE Final   C Diff interpretation No C. difficile detected.  Final    Comment: Performed at [facility], [place] [address] [place], [place] [address]  Culture, respiratory (NON-Expectorated)     Status: None   Collection Time: 09/08/17  2:36 PM  Result Value Ref Range Status   Specimen Description TRACHEAL ASPIRATE  Final   Special Requests NONE  Final   Gram Stain   Final  MODERATE WBC PRESENT,BOTH PMN AND MONONUCLEAR RARE SQUAMOUS EPITHELIAL CELLS PRESENT NO ORGANISMS SEEN    Culture   Final    RARE Consistent with normal respiratory flora. Performed at [facility], [place] [address] [place], [place] [address]    Report Status 09/10/2017 FINAL  Final  Culture, blood (routine x 2)     Status: None   Collection Time: 09/09/17  3:51 AM  Result Value Ref Range Status   Specimen Description BLOOD LEFT HAND  Final   Special Requests   Final    BOTTLES DRAWN AEROBIC ONLY Blood Culture adequate volume   Culture   Final    NO GROWTH 5 DAYS Performed at [facility], [place] [address],  [place], [place] [address]    Report Status 09/14/2017 FINAL  Final  Culture, blood (routine x 2)     Status: None   Collection Time: 09/09/17  3:52 AM  Result Value Ref Range Status   Specimen Description BLOOD LEFT HAND  Final   Special Requests   Final    BOTTLES DRAWN AEROBIC ONLY Blood Culture adequate volume   Culture   Final    NO GROWTH 5 DAYS Performed at [facility], [place] [address], [place],  [address]    Report Status 09/14/2017 FINAL  Final         Radiology Studies: No results found.      Scheduled Meds: . chlorhexidine  15 mL Mouth Rinse BID  . enoxaparin (LOVENOX) injection  40 mg Subcutaneous Q24H  . feeding supplement (PRO-STAT SUGAR FREE 64)  30 mL Per Tube Daily  . FLUoxetine  20 mg Per Tube QPC breakfast  . free water  30-60 mL Per Tube Q8H  . Gerhardt's butt cream   Topical BID  . insulin aspart  0-20 Units Subcutaneous Q4H  . insulin aspart  4 Units Subcutaneous Q4H  . insulin glargine  22 Units Subcutaneous QHS  . mouth rinse  15 mL Mouth Rinse BID  . mouth rinse  15 mL Mouth Rinse q12n4p  . metoprolol tartrate  50 mg Per Tube BID  . multivitamin  1 tablet Oral Daily  . pantoprazole sodium  40 mg Per Tube BID  . rosuvastatin  10 mg Per Tube q1800  . traZODone  100 mg Per Tube QHS   Continuous Infusions: . sodium chloride 10 mL/hr at 09/11/17 1800  . feeding supplement (GLUCERNA 1.2 CAL) 1,000 mL (09/17/17 0448)     LOS: 24 days    Time spent: 52mn    PDomenic Polite MD [facility] Page via [email or web address], password [email or web address] After 7PM please contact night-coverage  09/17/2017, 1:26 PM

## 2017-09-17 NOTE — Progress Notes (Signed)
I was contacted by pt's RN that pt is now requesting d/c home. I met with pt at bedside to discuss her wishes. Pt wishes to d/c home for she states her backside hurts and she feels she has progressed well enough to d/c home with [facility]. RN has spoken with pt's spouse by phone today and he is aware. I notified RN CM of plans per pt wishes. We will sign off at this time. [phone]

## 2017-09-17 NOTE — Progress Notes (Signed)
I await insurance approval for possible admission to inpt rehab today. [phone]

## 2017-09-17 NOTE — Progress Notes (Signed)
  Speech Language Pathology Treatment: Dysphagia  Patient Details Name: Holly Figueroa MRN: [id number] DOB: 12-07-1965 Today's Date: 09/17/2017 Time: 1030-1040 SLP Time Calculation (min) (ACUTE ONLY): 10 min  Assessment / Plan / Recommendation Clinical Impression  Pt tolerating nectar thick liquids well. SP instructed pt on thickening with demonstration. Pt return demonstrated thickening water with min verbal cues needed. Pt may continue current diet, recommend NG tube out so oral intake and pt comfort can improve. Will follow for ongoing interventions and potential upgrade to thin liquids, which pt tolerates relatively well with very small sips though voice still quite dysphonic.   HPI HPI: Gita Dilger Aldersonis a 52 y.o.femalewith medical history significant ofmitral stenosis, DM type II, CHF last EF 65-70% with grade 1 diastolic dysfunction, mulltifocal PNA 07/2017 presents with worsening dyspnea. Intubated 3/2-3/21. CXR with bilateral infiltrates read as pulm edema. BSE 10/2014 with acute reversible dysphagia after intubation and recommended full liquid diet. MBS recommended following BSE.      SLP Plan  Continue with current plan of care       Recommendations  Diet recommendations: Nectar-thick liquid;Dysphagia 3 (mechanical soft) Medication Administration: Whole meds with liquid Supervision: Patient able to self feed Compensations: Slow rate;Small sips/bites Postural Changes and/or Swallow Maneuvers: Seated upright 90 degrees                Follow up Recommendations: 24 hour supervision/assistance SLP Visit Diagnosis: Dysphagia, oropharyngeal phase (R13.12) Plan: Continue with current plan of care       Melbeta Ashkan Chamberland, MA CCC-SLP [phone]  Lynann Beaver 09/17/2017, 10:55 AM

## 2017-09-17 NOTE — Care Management Important Message (Signed)
Important Message  Patient Details  Name: Holly Figueroa MRN: [id number] Date of Birth: [birth year]   Medicare Important Message Given:  Yes    Erenest Rasher, RN 09/17/2017, 4:44 PM

## 2017-09-17 NOTE — Progress Notes (Signed)
Nutrition Follow-up  DOCUMENTATION CODES:   Obesity unspecified  INTERVENTION:   -D/c Prostat  -D/c Glucerna -Magic Cup TID with meals -Continue MVI daily  NUTRITION DIAGNOSIS:   Increased nutrient needs related to wound healing as evidenced by estimated needs.  Ongoing  GOAL:   Patient will meet greater than or equal to 90% of their needs  Progressing  MONITOR:   PO intake, Supplement acceptance, Diet advancement, Labs, Weight trends, Skin, I & O's  REASON FOR ASSESSMENT:   Consult Enteral/tube feeding initiation and management  ASSESSMENT:   52 y.o. female with medical history significant of mitral stenosis, DM type II, CHF last EF 65-70% with grade 1 diastolic dysfunction  8/13- s/p MBSS- advanced to dysphagia 3 diet with nectar thick liquids 3/26- cortrak removed  Pt receiving nursing care of using bathroom at times of visits.  Case discussed with RN; requesting assistance with removal of cortrak tube. Coordinated care by having RD from cortrak team instruct RN how to remove tube and bridle via phone. Pt no longer with enteral access so will d/c TF and associated orders.   Pt with fair oral intake; PO: 40%. Per SLP, suspect intake will increase once tube is removed.  Labs reviewed: CBGS: [phone] (inpatient orders for glycemic control are 0-20 units insulin aspart every 4 hours, 4 units insulin aspart every 4 hours, and 22 units insulin aspart q HS).   Diet Order:  DIET DYS 3 Room service appropriate? Yes; Fluid consistency: Nectar Thick  EDUCATION NEEDS:   No education needs have been identified at this time  Skin:  Skin Assessment: Skin Integrity Issues: Skin Integrity Issues:: Stage III, DTI, Stage II DTI: coccyx Stage II: rt nare Stage III: coccyx  Last BM:  09/17/17  Height:   Ht Readings from Last 1 Encounters:  08/28/17 _0  (1.676 m)    Weight:   Wt Readings from Last 1 Encounters:  09/12/17 163 lb 2.3 oz (74 kg)    Ideal Body  Weight:  59.1 kg  BMI:  Body mass index is 26.33 kg/m.  Estimated Nutritional Needs:   Kcal:  2070-2220  Protein:  105-120 grams  Fluid:  >/= 1.8 L/day    Rashi Giuliani A. Jimmye Norman, RD, LDN, CDE Pager: [phone] After hours Pager: [phone]

## 2017-09-18 LAB — HEMOGLOBIN A1C
HEMOGLOBIN A1C: 6 % — AB (ref 4.8–5.6)
Mean Plasma Glucose: 125.5 mg/dL

## 2017-09-18 MED ORDER — METOPROLOL TARTRATE 50 MG PO TABS: 50.0000 mg | ORAL_TABLET | Freq: Two times a day (BID) | ORAL | 0 refills | Status: DC

## 2017-09-18 MED ORDER — TRAZODONE HCL 100 MG PO TABS: 100.0000 mg | ORAL_TABLET | Freq: Every day | ORAL | Status: DC

## 2017-09-18 MED ORDER — RESOURCE THICKENUP CLEAR PO POWD: ORAL | 0 refills | Status: DC

## 2017-09-18 MED ORDER — PANTOPRAZOLE SODIUM 40 MG PO TBEC: 40.0000 mg | DELAYED_RELEASE_TABLET | Freq: Every day | ORAL | Status: DC

## 2017-09-18 MED ORDER — METOPROLOL TARTRATE 50 MG PO TABS: 50.0000 mg | ORAL_TABLET | Freq: Two times a day (BID) | ORAL | Status: DC

## 2017-09-18 NOTE — Consult Note (Signed)
[place] Nurse wound consult note Reason for Consult: DTI to coccyx now with wound bed Wound type:pressure and MASD Pressure Injury POA: no Measurement: 2cm x 1cm x 0.1 partial thickness wound over coccyx surrounding by area of MASD in gluteal cleft and upper buttocks Wound QIZ:WRBB Drainage (amount, consistency, odor) none Periwound:see above Dressing procedure/placement/frequency:Patient states she is hoping to go home today. She is up ambulating in room, now with control of bowel and mostly of bladder except for stress incontinence. Patient using Barrier Cream twice daily, instructed to continue at home. Instructed patient in correct application of foam as I removed her foam dressing and reapplied with a bend like a taco and placed deep in crevice of gluteal cleft and then over buttocks. Patient verbalizes understanding of how to do this and rational of not placing over top of buttocks which essentially is taping them together where she can not get air flow to this MASD area. If pt still in house next week will reassess at that time, suspect she will be home  Before then.  Please re-consult if we need to assist further.   Fara Olden, RN-C, WTA-C, [place] Wound Treatment Associate Ostomy Care Associate

## 2017-09-18 NOTE — Progress Notes (Signed)
Occupational Therapy Treatment Patient Details Name: Holly Figueroa MRN: [id number] DOB: 10-Nov-1965 Today's Date: 09/18/2017    History of present illness Pt. is a 52 y.o. F with significant PMH of mitral stenosis, DM type II, CHF, who was admitted with ARDS from influenza B pneumonia complicated by pulmonary edema and prolonged respiratory failure. Patient was intubated 3/2-3/21.      OT comments  Pt demonstrating progress toward OT goals and has met toileting and grooming goals today. She was able to complete ambulating simulated toilet transfers with min guard assist this session. Additionally educated pt concerning safe tub transfers and she was able to complete with min guard assist. Pt continues to demonstrate decreased attention and awareness skills and will require 24 hour assistance to maintain safety once discharged home. Continue to feel that pt would benefit from post-acute rehabilitation but she reports preference for home. Thus, updated D/C recommendation to [facility] follow-up with 24 hour assistance. Will continue to follow while admitted.    Follow Up Recommendations  Home health OT;Supervision/Assistance - 24 hour    Equipment Recommendations  3 in 1 bedside commode    Recommendations for Other Services      Precautions / Restrictions Precautions Precautions: Fall Restrictions Weight Bearing Restrictions: No       Mobility Bed Mobility               General bed mobility comments: OOB on sofa in room on my arrival.   Transfers Overall transfer level: Needs assistance Equipment used: 1 person hand held assist Transfers: Sit to/from Stand Sit to Stand: Supervision         General transfer comment: Supervision for safety when standing from sofa. Min guard assist once ambulating.     Balance Overall balance assessment: Needs assistance Sitting-balance support: Feet supported;No upper extremity supported Sitting balance-Leahy Scale: Good     Standing  balance support: Bilateral upper extremity supported;During functional activity Standing balance-Leahy Scale: Fair Standing balance comment: Min guard to supervision throughout.                            ADL either performed or assessed with clinical judgement   ADL Overall ADL's : Needs assistance/impaired     Grooming: Wash/dry face;Wash/dry hands;Standing;Supervision/safety                   Toilet Transfer: Ambulation;Min Psychiatric nurse Details (indicate cue type and reason): Close supervision for safety.      Tub/ Shower Transfer: Tub transfer;Min guard;Ambulation   Functional mobility during ADLs: Supervision/safety;Min guard General ADL Comments: Pt able to stand at sink for ADL tasks with close supervision. Also able to pick items up from floor with min guard assist. Reviewed fall prevention and safety at home.      Vision       Perception     Praxis      Cognition Arousal/Alertness: Awake/alert Behavior During Therapy: WFL for tasks assessed/performed Overall Cognitive Status: No family/caregiver present to determine baseline cognitive functioning Area of Impairment: Safety/judgement;Awareness;Problem solving;Attention                   Current Attention Level: Selective     Safety/Judgement: Decreased awareness of safety;Decreased awareness of deficits Awareness: Emergent Problem Solving: Requires verbal cues General Comments: Pt easily distracted and requires cues to continue with tasks with moderate distractions.         Exercises     Shoulder  Instructions       General Comments      Pertinent Vitals/ Pain       Pain Assessment: No/denies pain  Home Living                                          Prior Functioning/Environment              Frequency  Min 2X/week        Progress Toward Goals  OT Goals(current goals can now be found in the care plan section)  Progress towards OT  goals: Progressing toward goals  Acute Rehab OT Goals Patient Stated Goal: go home OT Goal Formulation: With patient Time For Goal Achievement: 09/28/17 Potential to Achieve Goals: Good  Plan Discharge plan needs to be updated(pt declined CIR)    Co-evaluation                 AM-PAC PT "6 Clicks" Daily Activity     Outcome Measure   Help from another person eating meals?: A Little Help from another person taking care of personal grooming?: A Little Help from another person toileting, which includes using toliet, bedpan, or urinal?: A Little Help from another person bathing (including washing, rinsing, drying)?: A Little Help from another person to put on and taking off regular upper body clothing?: A Little Help from another person to put on and taking off regular lower body clothing?: A Little 6 Click Score: 18    End of Session Equipment Utilized During Treatment: Gait belt  OT Visit Diagnosis: Unsteadiness on feet (R26.81);Other abnormalities of gait and mobility (R26.89);Muscle weakness (generalized) (M62.81);Cognitive communication deficit (R41.841)   Activity Tolerance Patient tolerated treatment well   Patient Left in chair;with call bell/phone within reach;with chair alarm set   Nurse Communication Mobility status        Time: 1132-1140 OT Time Calculation (min): 8 min  Charges: OT General Charges $OT Visit: 1 Visit OT Treatments $Self Care/Home Management : 8-22 mins  Norman Herrlich, MS OTR/L  Pager: [place] A [name] 09/18/2017, 12:10 PM

## 2017-09-18 NOTE — Social Work (Signed)
CSW aware that pt is discharging home.   CSW signing off. Please consult if any additional needs arise.  Alexander Mt, [place] Work [phone]

## 2017-09-19 ENCOUNTER — Ambulatory Visit: Payer: Medicare PPO | Admitting: Gastroenterology

## 2017-10-02 LAB — FUNGUS CULTURE WITH STAIN

## 2017-10-02 LAB — FUNGAL ORGANISM REFLEX

## 2017-10-02 LAB — FUNGUS CULTURE RESULT

## 2017-10-09 NOTE — Discharge Summary (Signed)
Physician Discharge Summary  Holly Figueroa NOI:[id number] DOB: [birth year] DOA: 08/24/2017  PCP: Keane Police, MD  Admit date: 08/24/2017 Discharge date:09/18/2017  Time spent: 35 minutes  Recommendations for Outpatient Follow-up:  1. PCP in 1 week, Home health SLP to FU and advance diet as tolerated 2. Home health PT/OT 3. Cardiology in 2-3weeks   Discharge Diagnoses:  Active Problems:   Acute respiratory failure (HCC)   Sepsis ([place])   HCAP (healthcare-associated pneumonia)   Pressure injury of skin   Shock ([place])   Discharge Condition: stable  Diet recommendation: dysphagia 3  Filed Weights   09/10/17 0502 09/11/17 0500 09/12/17 0500  Weight: 83 kg (182 lb 15.7 oz) 81.1 kg (178 lb 12.7 oz) 74 kg (163 lb 2.3 oz)    History of present illness:  3/2 Admit with SOB, vomited on BiPAP >intubated, ARDS  3/03 Placed on paralytics, requiring pressors 3/05 PRBC x1. Paralytics stopped 3/07 Off pressors. 3/08 Bronch >>unexpectedly showed focal bleeding from right upper lobe, anterior segment 3/09 Reparalysed, proned 3/10 placed supine 3/11 NMB stopped.  3/13 worsening hypoxia, nimbex resumed 3/14 placing PA catheter and then dissipating transition to [facility] 3/15 TEE, nimbex d/c'ed > 3/17 Bronch: diffuse bronchitis, bloody secretions 3/20 extubated              Brief Narrative: PCCM transferred to [facility] 3/22 Briefly this is a 52 year old female with history of severe rheumatic mitral stenosis, remote mitral valvotomy 2012 at [place] admitted to the critical care service on 3/2 with severe hypoxemia, ARDS from influenza B pneumonia complicated by prolonged respiratory failure, was intubated     Hospital Course:  Acute respiratory failure ([place]) -complicated hospitalization with prolonged respiratory failure requiring mechanical ventilation -Intubated 3/2 and extubated 3/20 -resolved -Influenza B positive, completed course of Tamiflu -Blood cultures negative, strep  pneumo antigen was negative tracheal aspirate negative -BAL on 3/8 with Candida tropicalis and glabrata -completed 7days of anidulafungin therapy 3/26 -CIR was originally planned but now pt declines this and set up with [facility] PT/OT/SLP/RN at FU -FU with PCP in 1 week  Dysphagia -Suspected to be secondary to prolonged mechanical ventilation -Expect this to improve, SLP evaluation and modified barium swallow from 3/21 noted -Cortrak tube replaced 3/22 ,-s/p repeat MBS 3/25 improving, started D3 diet with thickened liquids -cortrak removed, diet being advanced -Home health SLP set up at DC  Severe sepsis -Resolved  Type 2 diabetes mellitus -metformin held, Continue current dose of Lantus, sliding scale insulin for now  Chronic diastolic CHF -clinically euvolemic at this time, 9L negative -Monitor urine output and B met daily -Lasix PRN at discharge to rehabilitation  Rheumatic valcular dz/Moderate mitral stenosis and aortic stenosis -Per TEE 3/15 -Appreciate cards input,  - continue metoprolol -Follow-up with cardiology, assess need for diuretics at FU   Consultants:   PCCM  Cards      Discharge Exam: Vitals:   09/17/17 2201 09/18/17 0508  BP: (!) 154/65 133/69  Pulse: 88 91  Resp: 16 17  Temp: 97.8 F (36.6 C) 98.9 F (37.2 C)  SpO2: 97% 98%    General: AAOx3 Cardiovascular: S1S2/RRR Respiratory: CTAB  Discharge Instructions   Discharge Instructions    Diet - low sodium heart healthy   Complete by:  As directed    Discharge instructions   Complete by:  As directed    Dysphagia 3 carb modified diet with thickened liquids   Increase activity slowly   Complete by:  As directed    Increase activity  slowly   Complete by:  As directed      Allergies as of 09/18/2017      Reactions   Iodinated Diagnostic Agents Anaphylaxis   Aspirin Other (See Comments)   "Possible blood in stool" the 334m. Can take 814m   Ibuprofen Other (See Comments)    "Possible blood in stool"   Sulfa Antibiotics Hives   Zofran [ondansetron Hcl]    Bad headaches   Dilaudid [hydromorphone Hcl] Itching, Palpitations   Hydrocodone Palpitations      Medication List    STOP taking these medications   amoxicillin-clavulanate 875-125 MG tablet Commonly known as:  AUGMENTIN     TAKE these medications   AEROCHAMBER PLUS FLO-VU MEDIUM Misc 1 each by Other route every 6 (six) hours as needed (sob,wheezing).   albuterol 108 (90 Base) MCG/ACT inhaler Commonly known as:  PROVENTIL HFA;VENTOLIN HFA Inhale 1-2 puffs into the lungs every 6 (six) hours as needed for wheezing or shortness of breath (with Aerochamber).   ALPRAZolam 1 MG tablet Commonly known as:  XANAX Take 0.5 tablets (0.5 mg total) by mouth 3 (three) times daily. anxiety What changed:    how much to take  additional instructions   aspirin EC 81 MG tablet Take 81 mg by mouth daily after breakfast.   FLUoxetine 40 MG capsule Commonly known as:  PROZAC Take 80 mg by mouth daily after breakfast.   furosemide 20 MG tablet Commonly known as:  LASIX Take 20 mg by mouth daily as needed for fluid.   hydrocortisone 25 MG suppository Commonly known as:  ANUSOL-HC Place 1 suppository (25 mg total) rectally 4 (four) times daily -  with meals and at bedtime. What changed:    when to take this  reasons to take this   metFORMIN 500 MG tablet Commonly known as:  GLUCOPHAGE Take 1 tablet (500 mg total) by mouth 2 (two) times daily with a meal. What changed:  how much to take   metoprolol tartrate 50 MG tablet Commonly known as:  LOPRESSOR Take 1 tablet (50 mg total) by mouth 2 (two) times daily.   pantoprazole 40 MG tablet Commonly known as:  PROTONIX Take 40 mg by mouth daily after breakfast.   potassium chloride SA 20 MEQ tablet Commonly known as:  K-DUR,KLOR-CON Take 1 tablet by mouth daily as needed (when taken with Lasix for fluid).   promethazine 12.5 MG tablet Commonly  known as:  PHENERGAN Take 1 tablet (12.5 mg total) by mouth every 6 (six) hours as needed for nausea or vomiting.   RESOURCE THICKENUP CLEAR Powd To be mixed with liquids   rosuvastatin 10 MG tablet Commonly known as:  CRESTOR Take 10 mg by mouth daily after breakfast.   traZODone 50 MG tablet Commonly known as:  DESYREL Take 100 mg by mouth at bedtime.   VITAMIN B-12 PO Take 1 tablet by mouth daily after breakfast.   vitamin C 500 MG tablet Commonly known as:  ASCORBIC ACID Take 500 mg by mouth daily after breakfast.   Vitamin D-3 5000 units Tabs Take 5,000 Units by mouth daily after breakfast.      Allergies  Allergen Reactions  . Iodinated Diagnostic Agents Anaphylaxis  . Aspirin Other (See Comments)    "Possible blood in stool" the 32547mCan take 16m56m. Ibuprofen Other (See Comments)    "Possible blood in stool"  . Sulfa Antibiotics Hives  . Zofran [Ondansetron Hcl]     Bad headaches  . Dilaudid [  Hydromorphone Hcl] Itching and Palpitations  . Hydrocodone Palpitations   Follow-up Information    [facility] Follow up.   Specialty:  Home Health Services Why:  Home Health Physical Therapy and Speech Therapy- agency will call to arrange to arrange initial visit Contact information: [place] [address] [phone]        Keane Police, MD. Schedule an appointment as soon as possible for a visit in 1 week(s).   Specialty:  Family Medicine Contact information: [address] [place] [address] [phone]            The results of significant diagnostics from this hospitalization (including imaging, microbiology, ancillary and laboratory) are listed below for reference.    Significant Diagnostic Studies: Dg Chest Portable 1 View  Result Date: 09/12/2017 CLINICAL DATA:  ARDS EXAM: PORTABLE CHEST 1 VIEW COMPARISON:  09/11/2017 FINDINGS: Endotracheal tube and NG tube removed. Diffuse bilateral airspace disease  unchanged. No pneumothorax or effusion. IMPRESSION: Endotracheal tube and NG tube removed Diffuse bilateral airspace disease unchanged. Electronically Signed   By: Franchot Gallo M.D.   On: 09/12/2017 11:18   Dg Chest Port 1 View  Result Date: 09/11/2017 CLINICAL DATA:  Check endotracheal tube placement EXAM: PORTABLE CHEST 1 VIEW COMPARISON:  09/10/2017 FINDINGS: Cardiac shadow is stable. The degree of vascular congestion has improved significantly with improved pulmonary edema. Some persistent bibasilar atelectasis is noted. The endotracheal tube and nasogastric catheter are stable. IMPRESSION: Improving aeration. Electronically Signed   By: Inez Catalina M.D.   On: 09/11/2017 09:01   Dg Chest Port 1 View  Result Date: 09/10/2017 CLINICAL DATA:  Respiratory failure. EXAM: PORTABLE CHEST 1 VIEW COMPARISON:  Chest x-ray from yesterday. FINDINGS: The patient is rotated to the right. Unchanged endotracheal and enteric tubes. Stable cardiomediastinal silhouette. Diffuse interstitial and alveolar opacities in the left greater than right lungs appear worsened when compared to prior study, especially on the left. No pleural effusion or pneumothorax. No acute osseous abnormality. IMPRESSION: Worsening diffuse bilateral interstitial and airspace disease. Electronically Signed   By: Titus Dubin M.D.   On: 09/10/2017 07:43   Dg Swallowing Func-speech Pathology  Result Date: 09/12/2017 Objective Swallowing Evaluation: Type of Study: MBS-Modified Barium Swallow Study  Patient Details Name: LUDA CHARBONNEAU MRN: [id number] Date of Birth: March 16, 1966 Today's Date: 09/12/2017 Time: SLP Start Time (ACUTE ONLY): 1000 -SLP Stop Time (ACUTE ONLY): 1015 SLP Time Calculation (min) (ACUTE ONLY): 15 min Past Medical History: Past Medical History: Diagnosis Date . Acute respiratory failure ([place])  . Anemia 08-2008  Blood transfusion . Anxiety  . Benzodiazepine dependence ([place])  . Cervical cancer ([place])  . CHF (congestive heart  failure) ([place])  . Depression  . Diabetes mellitus without complication ([place])  . Headache(784.0)   migraines . Hypokalemia  . Legionella pneumonia ([place])  . Leukocytosis, unspecified  . Mitral stenosis  . Panic attacks  . Tobacco abuse  Past Surgical History: Past Surgical History: Procedure Laterality Date . BIOPSY  06/12/2017  Procedure: BIOPSY;  Surgeon: Daneil Dolin, MD;  Location: [facility];  Service: Gastroenterology;;  esophagus . cardiac valve repair  2011  stretched mitral valve . CERVICAL CONIZATION W/BX  2000 . CHOLECYSTECTOMY  06/26/2012  Procedure: LAPAROSCOPIC CHOLECYSTECTOMY WITH INTRAOPERATIVE CHOLANGIOGRAM;  Surgeon: Ralene Ok, MD;  Location: [facility];  Service: General;  Laterality: N/A; . COLONOSCOPY WITH PROPOFOL N/A 06/12/2017  Normal colon, normal TI, Grade I internal hemorrhoids . ESOPHAGOGASTRODUODENOSCOPY (EGD) WITH PROPOFOL N/A 06/12/2017  esophagitis, possibly chemical or pill-induced, s/p dilation. small hiatal hernia, normal duodenal bulb and D 2 . GIVENS CAPSULE STUDY N/A 07/19/2017  incomplete capsule study as did not reach cecum.  Marland Kitchen LASER ABLATION OF THE CERVIX   . MALONEY DILATION  06/12/2017  Procedure: MALONEY DILATION;  Surgeon: Daneil Dolin, MD;  Location: [facility];  Service: Gastroenterology;; . MOLE REMOVAL   . NASAL SINUS SURGERY   HPI: Kaylana Fenstermacher Aldersonis a 52 y.o.femalewith medical history significant ofmitral stenosis, DM type II, CHF last EF 65-70% with grade 1 diastolic dysfunction, mulltifocal PNA 07/2017 presents with worsening dyspnea. Intubated 3/2-3/21. CXR with bilateral infiltrates read as pulm edema. BSE 10/2014 with acute reversible dysphagia after intubation and recommended full liquid diet. MBS recommended following BSE.  No data recorded Assessment / Plan / Recommendation CHL IP CLINICAL IMPRESSIONS 09/12/2017 Clinical Impression Pt exhibited moderate oropharyngeal marked by decreased timing and weakness of laryngeal structures resulting in  aspiration of thin, nectar and honey thick barium. Penetration occurred slightly prior to epiglottic inversion and during swallow with mildly delayed weak reflexive cough and chin tuck posture was not effective strategy. Decreased oral cohesion, manipulation and delayed transit with all consistencies. Pt began to gag with mild emesis during study. Unable to transit puree texture, SLP suctioned from oral cavity. She is presently not safe with any po's; prognosis is good for return to swallow given extended time post 3 week intubation. Recommend short term alternative nutrition.  SLP Visit Diagnosis Dysphagia, oropharyngeal phase (R13.12) Attention and concentration deficit following -- Frontal lobe and executive function deficit following -- Impact on safety and function Moderate aspiration risk;Severe aspiration risk   CHL IP TREATMENT RECOMMENDATION 09/12/2017 Treatment Recommendations Therapy as outlined in treatment plan below   Prognosis 09/12/2017 Prognosis for Safe Diet Advancement Good Barriers to Reach Goals -- Barriers/Prognosis Comment -- CHL IP DIET RECOMMENDATION 09/12/2017 SLP Diet Recommendations NPO Liquid Administration via -- Medication Administration Via alternative means Compensations -- Postural Changes --   CHL IP OTHER RECOMMENDATIONS 09/12/2017 Recommended Consults -- Oral Care Recommendations Oral care QID Other Recommendations --   CHL IP FOLLOW UP RECOMMENDATIONS 09/12/2017 Follow up Recommendations 24 hour supervision/assistance   CHL IP FREQUENCY AND DURATION 09/12/2017 Speech Therapy Frequency (ACUTE ONLY) min 2x/week Treatment Duration 2 weeks      CHL IP ORAL PHASE 09/12/2017 Oral Phase Impaired Oral - Pudding Teaspoon -- Oral - Pudding Cup -- Oral - Honey Teaspoon Decreased bolus cohesion;Delayed oral transit;Holding of bolus;Reduced posterior propulsion;Weak lingual manipulation Oral - Honey Cup NT Oral - Nectar Teaspoon Decreased bolus cohesion;Delayed oral transit;Holding of bolus;Reduced  posterior propulsion;Weak lingual manipulation Oral - Nectar Cup NT Oral - Nectar Straw -- Oral - Thin Teaspoon -- Oral - Thin Cup Decreased bolus cohesion;Delayed oral transit;Holding of bolus;Reduced posterior propulsion;Weak lingual manipulation Oral - Thin Straw -- Oral - Puree Decreased bolus cohesion;Delayed oral transit;Holding of bolus;Reduced posterior propulsion;Weak lingual manipulation Oral - Mech Soft NT Oral - Regular NT Oral - Multi-Consistency NT Oral - Pill NT Oral Phase - Comment --  CHL IP PHARYNGEAL PHASE 09/12/2017 Pharyngeal Phase Impaired Pharyngeal- Pudding Teaspoon -- Pharyngeal -- Pharyngeal- Pudding Cup -- Pharyngeal -- Pharyngeal- Honey Teaspoon Penetration/Aspiration during swallow;Reduced laryngeal elevation;Reduced airway/laryngeal closure;Reduced epiglottic inversion Pharyngeal Material enters airway, passes BELOW cords and not ejected out despite cough attempt by patient Pharyngeal- Honey Cup NT Pharyngeal -- Pharyngeal- Nectar Teaspoon Penetration/Aspiration during swallow;Reduced epiglottic inversion;Reduced laryngeal elevation Pharyngeal Material enters airway, passes BELOW cords and not ejected out despite  cough attempt by patient Pharyngeal- Nectar Cup NT Pharyngeal -- Pharyngeal- Nectar Straw -- Pharyngeal -- Pharyngeal- Thin Teaspoon -- Pharyngeal -- Pharyngeal- Thin Cup Penetration/Aspiration before swallow;Penetration/Aspiration during swallow;Reduced airway/laryngeal closure Pharyngeal Material enters airway, passes BELOW cords and not ejected out despite cough attempt by patient Pharyngeal- Thin Straw -- Pharyngeal -- Pharyngeal- Puree (No Data) Pharyngeal -- Pharyngeal- Mechanical Soft -- Pharyngeal -- Pharyngeal- Regular -- Pharyngeal -- Pharyngeal- Multi-consistency -- Pharyngeal -- Pharyngeal- Pill -- Pharyngeal -- Pharyngeal Comment --  CHL IP CERVICAL ESOPHAGEAL PHASE 09/12/2017 Cervical Esophageal Phase WFL Pudding Teaspoon -- Pudding Cup -- Honey Teaspoon -- Honey  Cup -- Nectar Teaspoon -- Nectar Cup -- Nectar Straw -- Thin Teaspoon -- Thin Cup -- Thin Straw -- Puree -- Mechanical Soft -- Regular -- Multi-consistency -- Pill -- Cervical Esophageal Comment -- No flowsheet data found. [name] 09/12/2017, 1:08 PM               Microbiology: No results found for this or any previous visit (from the past 240 hour(s)).   Labs: Basic Metabolic Panel: No results for input(s): NA, K, CL, CO2, GLUCOSE, BUN, CREATININE, CALCIUM, MG, PHOS in the last 168 hours. Liver Function Tests: No results for input(s): AST, ALT, ALKPHOS, BILITOT, PROT, ALBUMIN in the last 168 hours. No results for input(s): LIPASE, AMYLASE in the last 168 hours. No results for input(s): AMMONIA in the last 168 hours. CBC: No results for input(s): WBC, NEUTROABS, HGB, HCT, MCV, PLT in the last 168 hours. Cardiac Enzymes: No results for input(s): CKTOTAL, CKMB, CKMBINDEX, TROPONINI in the last 168 hours. BNP: BNP (last 3 results) Recent Labs    09/07/17 0608 09/08/17 0413 09/09/17 0351  BNP 207.8* 410.9* 471.3*    ProBNP (last 3 results) No results for input(s): PROBNP in the last 8760 hours.  CBG: No results for input(s): GLUCAP in the last 168 hours.     Signed:  Domenic Polite MD.  [facility] 10/09/2017, 10:58 PM

## 2017-10-13 LAB — ACID FAST CULTURE WITH REFLEXED SENSITIVITIES: ACID FAST CULTURE - AFSCU3: NEGATIVE

## 2017-11-21 ENCOUNTER — Telehealth: Payer: Self-pay | Admitting: Gastroenterology

## 2017-11-21 ENCOUNTER — Encounter: Payer: Self-pay | Admitting: Gastroenterology

## 2017-11-21 ENCOUNTER — Ambulatory Visit: Payer: Medicare PPO | Admitting: Gastroenterology

## 2017-11-21 NOTE — Telephone Encounter (Signed)
Patient was a no show and letter sent

## 2017-11-28 DIAGNOSIS — Z9889 Other specified postprocedural states: Secondary | ICD-10-CM | POA: Diagnosis not present

## 2017-11-28 DIAGNOSIS — K219 Gastro-esophageal reflux disease without esophagitis: Secondary | ICD-10-CM | POA: Diagnosis not present

## 2017-11-28 DIAGNOSIS — Z481 Encounter for planned postprocedural wound closure: Secondary | ICD-10-CM | POA: Diagnosis not present

## 2017-11-28 DIAGNOSIS — R918 Other nonspecific abnormal finding of lung field: Secondary | ICD-10-CM | POA: Diagnosis not present

## 2017-11-28 DIAGNOSIS — I10 Essential (primary) hypertension: Secondary | ICD-10-CM | POA: Diagnosis not present

## 2017-11-28 DIAGNOSIS — J811 Chronic pulmonary edema: Secondary | ICD-10-CM | POA: Diagnosis not present

## 2017-11-28 DIAGNOSIS — T148XXA Other injury of unspecified body region, initial encounter: Secondary | ICD-10-CM | POA: Diagnosis not present

## 2017-11-28 DIAGNOSIS — I340 Nonrheumatic mitral (valve) insufficiency: Secondary | ICD-10-CM | POA: Diagnosis not present

## 2017-11-28 DIAGNOSIS — J90 Pleural effusion, not elsewhere classified: Secondary | ICD-10-CM | POA: Diagnosis not present

## 2017-11-28 DIAGNOSIS — D62 Acute posthemorrhagic anemia: Secondary | ICD-10-CM | POA: Diagnosis not present

## 2017-11-28 DIAGNOSIS — J9811 Atelectasis: Secondary | ICD-10-CM | POA: Diagnosis not present

## 2017-11-28 DIAGNOSIS — E119 Type 2 diabetes mellitus without complications: Secondary | ICD-10-CM | POA: Diagnosis not present

## 2017-11-28 DIAGNOSIS — F329 Major depressive disorder, single episode, unspecified: Secondary | ICD-10-CM | POA: Diagnosis not present

## 2017-11-28 DIAGNOSIS — T8141XA Infection following a procedure, superficial incisional surgical site, initial encounter: Secondary | ICD-10-CM | POA: Diagnosis not present

## 2017-11-28 DIAGNOSIS — S2220XK Unspecified fracture of sternum, subsequent encounter for fracture with nonunion: Secondary | ICD-10-CM | POA: Diagnosis not present

## 2017-11-28 DIAGNOSIS — T8149XA Infection following a procedure, other surgical site, initial encounter: Secondary | ICD-10-CM | POA: Diagnosis not present

## 2017-11-28 DIAGNOSIS — F419 Anxiety disorder, unspecified: Secondary | ICD-10-CM | POA: Diagnosis not present

## 2017-11-28 DIAGNOSIS — Z952 Presence of prosthetic heart valve: Secondary | ICD-10-CM | POA: Diagnosis not present

## 2017-11-28 DIAGNOSIS — I517 Cardiomegaly: Secondary | ICD-10-CM | POA: Diagnosis not present

## 2017-11-28 DIAGNOSIS — I480 Paroxysmal atrial fibrillation: Secondary | ICD-10-CM | POA: Diagnosis not present

## 2017-11-28 DIAGNOSIS — I313 Pericardial effusion (noninflammatory): Secondary | ICD-10-CM | POA: Diagnosis not present

## 2017-11-28 DIAGNOSIS — B9689 Other specified bacterial agents as the cause of diseases classified elsewhere: Secondary | ICD-10-CM | POA: Diagnosis not present

## 2017-11-28 DIAGNOSIS — J449 Chronic obstructive pulmonary disease, unspecified: Secondary | ICD-10-CM | POA: Diagnosis not present

## 2017-12-13 DIAGNOSIS — T8149XA Infection following a procedure, other surgical site, initial encounter: Secondary | ICD-10-CM | POA: Diagnosis not present

## 2017-12-13 DIAGNOSIS — R791 Abnormal coagulation profile: Secondary | ICD-10-CM | POA: Diagnosis not present

## 2017-12-13 DIAGNOSIS — Z954 Presence of other heart-valve replacement: Secondary | ICD-10-CM | POA: Diagnosis not present

## 2017-12-13 DIAGNOSIS — J449 Chronic obstructive pulmonary disease, unspecified: Secondary | ICD-10-CM | POA: Diagnosis not present

## 2017-12-13 DIAGNOSIS — E119 Type 2 diabetes mellitus without complications: Secondary | ICD-10-CM | POA: Diagnosis not present

## 2017-12-13 DIAGNOSIS — T8141XA Infection following a procedure, superficial incisional surgical site, initial encounter: Secondary | ICD-10-CM | POA: Diagnosis not present

## 2017-12-13 DIAGNOSIS — B9620 Unspecified Escherichia coli [E. coli] as the cause of diseases classified elsewhere: Secondary | ICD-10-CM | POA: Diagnosis not present

## 2017-12-13 DIAGNOSIS — I10 Essential (primary) hypertension: Secondary | ICD-10-CM | POA: Diagnosis not present

## 2017-12-13 DIAGNOSIS — B9689 Other specified bacterial agents as the cause of diseases classified elsewhere: Secondary | ICD-10-CM | POA: Diagnosis not present

## 2017-12-17 DIAGNOSIS — B9689 Other specified bacterial agents as the cause of diseases classified elsewhere: Secondary | ICD-10-CM | POA: Diagnosis not present

## 2017-12-17 DIAGNOSIS — J449 Chronic obstructive pulmonary disease, unspecified: Secondary | ICD-10-CM | POA: Diagnosis not present

## 2017-12-17 DIAGNOSIS — B9620 Unspecified Escherichia coli [E. coli] as the cause of diseases classified elsewhere: Secondary | ICD-10-CM | POA: Diagnosis not present

## 2017-12-17 DIAGNOSIS — T8141XA Infection following a procedure, superficial incisional surgical site, initial encounter: Secondary | ICD-10-CM | POA: Diagnosis not present

## 2017-12-17 DIAGNOSIS — E119 Type 2 diabetes mellitus without complications: Secondary | ICD-10-CM | POA: Diagnosis not present

## 2017-12-17 DIAGNOSIS — I10 Essential (primary) hypertension: Secondary | ICD-10-CM | POA: Diagnosis not present

## 2017-12-19 DIAGNOSIS — Z954 Presence of other heart-valve replacement: Secondary | ICD-10-CM | POA: Diagnosis not present

## 2017-12-19 DIAGNOSIS — Z952 Presence of prosthetic heart valve: Secondary | ICD-10-CM | POA: Diagnosis not present

## 2017-12-19 DIAGNOSIS — T8131XD Disruption of external operation (surgical) wound, not elsewhere classified, subsequent encounter: Secondary | ICD-10-CM | POA: Diagnosis not present

## 2017-12-19 DIAGNOSIS — Z7901 Long term (current) use of anticoagulants: Secondary | ICD-10-CM | POA: Diagnosis not present

## 2017-12-19 DIAGNOSIS — J9811 Atelectasis: Secondary | ICD-10-CM | POA: Diagnosis not present

## 2017-12-19 DIAGNOSIS — T8149XD Infection following a procedure, other surgical site, subsequent encounter: Secondary | ICD-10-CM | POA: Diagnosis not present

## 2017-12-19 DIAGNOSIS — T8149XA Infection following a procedure, other surgical site, initial encounter: Secondary | ICD-10-CM | POA: Diagnosis not present

## 2017-12-20 DIAGNOSIS — T8149XA Infection following a procedure, other surgical site, initial encounter: Secondary | ICD-10-CM | POA: Diagnosis not present

## 2017-12-20 DIAGNOSIS — Z954 Presence of other heart-valve replacement: Secondary | ICD-10-CM | POA: Diagnosis not present

## 2017-12-20 DIAGNOSIS — R791 Abnormal coagulation profile: Secondary | ICD-10-CM | POA: Diagnosis not present

## 2017-12-24 DIAGNOSIS — E119 Type 2 diabetes mellitus without complications: Secondary | ICD-10-CM | POA: Diagnosis not present

## 2017-12-24 DIAGNOSIS — B9620 Unspecified Escherichia coli [E. coli] as the cause of diseases classified elsewhere: Secondary | ICD-10-CM | POA: Diagnosis not present

## 2017-12-24 DIAGNOSIS — B9689 Other specified bacterial agents as the cause of diseases classified elsewhere: Secondary | ICD-10-CM | POA: Diagnosis not present

## 2017-12-24 DIAGNOSIS — I10 Essential (primary) hypertension: Secondary | ICD-10-CM | POA: Diagnosis not present

## 2017-12-24 DIAGNOSIS — T8141XA Infection following a procedure, superficial incisional surgical site, initial encounter: Secondary | ICD-10-CM | POA: Diagnosis not present

## 2017-12-24 DIAGNOSIS — Z5181 Encounter for therapeutic drug level monitoring: Secondary | ICD-10-CM | POA: Diagnosis not present

## 2017-12-24 DIAGNOSIS — J449 Chronic obstructive pulmonary disease, unspecified: Secondary | ICD-10-CM | POA: Diagnosis not present

## 2017-12-27 DIAGNOSIS — R791 Abnormal coagulation profile: Secondary | ICD-10-CM | POA: Diagnosis not present

## 2017-12-27 DIAGNOSIS — Z954 Presence of other heart-valve replacement: Secondary | ICD-10-CM | POA: Diagnosis not present

## 2017-12-27 DIAGNOSIS — T8149XA Infection following a procedure, other surgical site, initial encounter: Secondary | ICD-10-CM | POA: Diagnosis not present

## 2017-12-30 DIAGNOSIS — Z792 Long term (current) use of antibiotics: Secondary | ICD-10-CM | POA: Diagnosis not present

## 2017-12-30 DIAGNOSIS — E119 Type 2 diabetes mellitus without complications: Secondary | ICD-10-CM | POA: Diagnosis not present

## 2017-12-30 DIAGNOSIS — Z7901 Long term (current) use of anticoagulants: Secondary | ICD-10-CM | POA: Diagnosis not present

## 2017-12-30 DIAGNOSIS — B9689 Other specified bacterial agents as the cause of diseases classified elsewhere: Secondary | ICD-10-CM | POA: Diagnosis not present

## 2017-12-30 DIAGNOSIS — T8141XA Infection following a procedure, superficial incisional surgical site, initial encounter: Secondary | ICD-10-CM | POA: Diagnosis not present

## 2017-12-30 DIAGNOSIS — I10 Essential (primary) hypertension: Secondary | ICD-10-CM | POA: Diagnosis not present

## 2017-12-30 DIAGNOSIS — B9620 Unspecified Escherichia coli [E. coli] as the cause of diseases classified elsewhere: Secondary | ICD-10-CM | POA: Diagnosis not present

## 2017-12-30 DIAGNOSIS — J449 Chronic obstructive pulmonary disease, unspecified: Secondary | ICD-10-CM | POA: Diagnosis not present

## 2018-01-01 DIAGNOSIS — R791 Abnormal coagulation profile: Secondary | ICD-10-CM | POA: Diagnosis not present

## 2018-01-01 DIAGNOSIS — T8149XA Infection following a procedure, other surgical site, initial encounter: Secondary | ICD-10-CM | POA: Diagnosis not present

## 2018-01-01 DIAGNOSIS — Z954 Presence of other heart-valve replacement: Secondary | ICD-10-CM | POA: Diagnosis not present

## 2018-01-02 DIAGNOSIS — T8149XD Infection following a procedure, other surgical site, subsequent encounter: Secondary | ICD-10-CM | POA: Diagnosis not present

## 2018-01-02 DIAGNOSIS — Z87891 Personal history of nicotine dependence: Secondary | ICD-10-CM | POA: Diagnosis not present

## 2018-01-03 DIAGNOSIS — Z6825 Body mass index (BMI) 25.0-25.9, adult: Secondary | ICD-10-CM | POA: Diagnosis not present

## 2018-01-03 DIAGNOSIS — E1165 Type 2 diabetes mellitus with hyperglycemia: Secondary | ICD-10-CM | POA: Diagnosis not present

## 2018-01-03 DIAGNOSIS — F319 Bipolar disorder, unspecified: Secondary | ICD-10-CM | POA: Diagnosis not present

## 2018-01-03 DIAGNOSIS — E785 Hyperlipidemia, unspecified: Secondary | ICD-10-CM | POA: Diagnosis not present

## 2018-01-03 DIAGNOSIS — Z299 Encounter for prophylactic measures, unspecified: Secondary | ICD-10-CM | POA: Diagnosis not present

## 2018-01-03 DIAGNOSIS — I10 Essential (primary) hypertension: Secondary | ICD-10-CM | POA: Diagnosis not present

## 2018-01-07 DIAGNOSIS — E119 Type 2 diabetes mellitus without complications: Secondary | ICD-10-CM | POA: Diagnosis not present

## 2018-01-07 DIAGNOSIS — R918 Other nonspecific abnormal finding of lung field: Secondary | ICD-10-CM | POA: Diagnosis not present

## 2018-01-07 DIAGNOSIS — T8141XA Infection following a procedure, superficial incisional surgical site, initial encounter: Secondary | ICD-10-CM | POA: Diagnosis not present

## 2018-01-07 DIAGNOSIS — B9689 Other specified bacterial agents as the cause of diseases classified elsewhere: Secondary | ICD-10-CM | POA: Diagnosis not present

## 2018-01-07 DIAGNOSIS — J449 Chronic obstructive pulmonary disease, unspecified: Secondary | ICD-10-CM | POA: Diagnosis not present

## 2018-01-07 DIAGNOSIS — Z954 Presence of other heart-valve replacement: Secondary | ICD-10-CM | POA: Diagnosis not present

## 2018-01-07 DIAGNOSIS — B9620 Unspecified Escherichia coli [E. coli] as the cause of diseases classified elsewhere: Secondary | ICD-10-CM | POA: Diagnosis not present

## 2018-01-07 DIAGNOSIS — R509 Fever, unspecified: Secondary | ICD-10-CM | POA: Diagnosis not present

## 2018-01-07 DIAGNOSIS — Z7901 Long term (current) use of anticoagulants: Secondary | ICD-10-CM | POA: Diagnosis not present

## 2018-01-07 DIAGNOSIS — I10 Essential (primary) hypertension: Secondary | ICD-10-CM | POA: Diagnosis not present

## 2018-01-07 DIAGNOSIS — Z452 Encounter for adjustment and management of vascular access device: Secondary | ICD-10-CM | POA: Diagnosis not present

## 2018-01-08 DIAGNOSIS — Z954 Presence of other heart-valve replacement: Secondary | ICD-10-CM | POA: Diagnosis not present

## 2018-01-08 DIAGNOSIS — K029 Dental caries, unspecified: Secondary | ICD-10-CM | POA: Diagnosis not present

## 2018-01-08 DIAGNOSIS — Z0389 Encounter for observation for other suspected diseases and conditions ruled out: Secondary | ICD-10-CM | POA: Diagnosis not present

## 2018-01-14 DIAGNOSIS — I10 Essential (primary) hypertension: Secondary | ICD-10-CM | POA: Diagnosis not present

## 2018-01-14 DIAGNOSIS — F319 Bipolar disorder, unspecified: Secondary | ICD-10-CM | POA: Diagnosis not present

## 2018-01-14 DIAGNOSIS — Z299 Encounter for prophylactic measures, unspecified: Secondary | ICD-10-CM | POA: Diagnosis not present

## 2018-01-14 DIAGNOSIS — E1165 Type 2 diabetes mellitus with hyperglycemia: Secondary | ICD-10-CM | POA: Diagnosis not present

## 2018-01-14 DIAGNOSIS — Z952 Presence of prosthetic heart valve: Secondary | ICD-10-CM | POA: Diagnosis not present

## 2018-01-14 DIAGNOSIS — Z6826 Body mass index (BMI) 26.0-26.9, adult: Secondary | ICD-10-CM | POA: Diagnosis not present

## 2018-01-15 DIAGNOSIS — I110 Hypertensive heart disease with heart failure: Secondary | ICD-10-CM | POA: Diagnosis not present

## 2018-01-15 DIAGNOSIS — I4891 Unspecified atrial fibrillation: Secondary | ICD-10-CM | POA: Diagnosis not present

## 2018-01-15 DIAGNOSIS — F418 Other specified anxiety disorders: Secondary | ICD-10-CM | POA: Diagnosis not present

## 2018-01-15 DIAGNOSIS — K219 Gastro-esophageal reflux disease without esophagitis: Secondary | ICD-10-CM | POA: Diagnosis not present

## 2018-01-15 DIAGNOSIS — E119 Type 2 diabetes mellitus without complications: Secondary | ICD-10-CM | POA: Diagnosis not present

## 2018-01-15 DIAGNOSIS — E785 Hyperlipidemia, unspecified: Secondary | ICD-10-CM | POA: Diagnosis not present

## 2018-01-15 DIAGNOSIS — I509 Heart failure, unspecified: Secondary | ICD-10-CM | POA: Diagnosis not present

## 2018-01-15 DIAGNOSIS — F329 Major depressive disorder, single episode, unspecified: Secondary | ICD-10-CM | POA: Diagnosis not present

## 2018-01-15 DIAGNOSIS — G4700 Insomnia, unspecified: Secondary | ICD-10-CM | POA: Diagnosis not present

## 2018-01-22 DIAGNOSIS — I9789 Other postprocedural complications and disorders of the circulatory system, not elsewhere classified: Secondary | ICD-10-CM | POA: Diagnosis not present

## 2018-01-22 DIAGNOSIS — I10 Essential (primary) hypertension: Secondary | ICD-10-CM | POA: Diagnosis not present

## 2018-01-22 DIAGNOSIS — Z954 Presence of other heart-valve replacement: Secondary | ICD-10-CM | POA: Diagnosis not present

## 2018-01-22 DIAGNOSIS — Z952 Presence of prosthetic heart valve: Secondary | ICD-10-CM | POA: Diagnosis not present

## 2018-01-22 DIAGNOSIS — I4891 Unspecified atrial fibrillation: Secondary | ICD-10-CM | POA: Diagnosis not present

## 2018-02-04 DIAGNOSIS — R079 Chest pain, unspecified: Secondary | ICD-10-CM | POA: Diagnosis not present

## 2018-02-04 DIAGNOSIS — Z952 Presence of prosthetic heart valve: Secondary | ICD-10-CM | POA: Diagnosis not present

## 2018-02-04 DIAGNOSIS — F319 Bipolar disorder, unspecified: Secondary | ICD-10-CM | POA: Diagnosis not present

## 2018-02-04 DIAGNOSIS — E1165 Type 2 diabetes mellitus with hyperglycemia: Secondary | ICD-10-CM | POA: Diagnosis not present

## 2018-02-04 DIAGNOSIS — E785 Hyperlipidemia, unspecified: Secondary | ICD-10-CM | POA: Diagnosis not present

## 2018-02-04 DIAGNOSIS — I10 Essential (primary) hypertension: Secondary | ICD-10-CM | POA: Diagnosis not present

## 2018-02-04 DIAGNOSIS — Z6826 Body mass index (BMI) 26.0-26.9, adult: Secondary | ICD-10-CM | POA: Diagnosis not present

## 2018-02-04 DIAGNOSIS — Z299 Encounter for prophylactic measures, unspecified: Secondary | ICD-10-CM | POA: Diagnosis not present

## 2018-02-18 DIAGNOSIS — I10 Essential (primary) hypertension: Secondary | ICD-10-CM | POA: Diagnosis not present

## 2018-02-18 DIAGNOSIS — I9789 Other postprocedural complications and disorders of the circulatory system, not elsewhere classified: Secondary | ICD-10-CM | POA: Diagnosis not present

## 2018-02-18 DIAGNOSIS — F319 Bipolar disorder, unspecified: Secondary | ICD-10-CM | POA: Diagnosis not present

## 2018-02-18 DIAGNOSIS — Z299 Encounter for prophylactic measures, unspecified: Secondary | ICD-10-CM | POA: Diagnosis not present

## 2018-02-18 DIAGNOSIS — I4891 Unspecified atrial fibrillation: Secondary | ICD-10-CM | POA: Diagnosis not present

## 2018-02-18 DIAGNOSIS — Z6827 Body mass index (BMI) 27.0-27.9, adult: Secondary | ICD-10-CM | POA: Diagnosis not present

## 2018-03-11 DIAGNOSIS — I10 Essential (primary) hypertension: Secondary | ICD-10-CM | POA: Diagnosis not present

## 2018-03-11 DIAGNOSIS — I4891 Unspecified atrial fibrillation: Secondary | ICD-10-CM | POA: Diagnosis not present

## 2018-03-11 DIAGNOSIS — Z299 Encounter for prophylactic measures, unspecified: Secondary | ICD-10-CM | POA: Diagnosis not present

## 2018-03-11 DIAGNOSIS — I9789 Other postprocedural complications and disorders of the circulatory system, not elsewhere classified: Secondary | ICD-10-CM | POA: Diagnosis not present

## 2018-03-11 DIAGNOSIS — Z6827 Body mass index (BMI) 27.0-27.9, adult: Secondary | ICD-10-CM | POA: Diagnosis not present

## 2018-04-01 DIAGNOSIS — I060 Rheumatic aortic stenosis: Secondary | ICD-10-CM | POA: Diagnosis not present

## 2018-04-01 DIAGNOSIS — Z952 Presence of prosthetic heart valve: Secondary | ICD-10-CM | POA: Diagnosis not present

## 2018-04-01 DIAGNOSIS — I4891 Unspecified atrial fibrillation: Secondary | ICD-10-CM | POA: Diagnosis not present

## 2018-04-01 DIAGNOSIS — I10 Essential (primary) hypertension: Secondary | ICD-10-CM | POA: Diagnosis not present

## 2018-04-01 DIAGNOSIS — E782 Mixed hyperlipidemia: Secondary | ICD-10-CM | POA: Diagnosis not present

## 2018-04-10 DIAGNOSIS — D229 Melanocytic nevi, unspecified: Secondary | ICD-10-CM | POA: Diagnosis not present

## 2018-04-10 DIAGNOSIS — E785 Hyperlipidemia, unspecified: Secondary | ICD-10-CM | POA: Diagnosis not present

## 2018-04-10 DIAGNOSIS — Z79899 Other long term (current) drug therapy: Secondary | ICD-10-CM | POA: Diagnosis not present

## 2018-04-10 DIAGNOSIS — Z1211 Encounter for screening for malignant neoplasm of colon: Secondary | ICD-10-CM | POA: Diagnosis not present

## 2018-04-10 DIAGNOSIS — Z1331 Encounter for screening for depression: Secondary | ICD-10-CM | POA: Diagnosis not present

## 2018-04-10 DIAGNOSIS — Z1339 Encounter for screening examination for other mental health and behavioral disorders: Secondary | ICD-10-CM | POA: Diagnosis not present

## 2018-04-10 DIAGNOSIS — J069 Acute upper respiratory infection, unspecified: Secondary | ICD-10-CM | POA: Diagnosis not present

## 2018-04-10 DIAGNOSIS — Z7189 Other specified counseling: Secondary | ICD-10-CM | POA: Diagnosis not present

## 2018-04-10 DIAGNOSIS — F419 Anxiety disorder, unspecified: Secondary | ICD-10-CM | POA: Diagnosis not present

## 2018-04-10 DIAGNOSIS — D649 Anemia, unspecified: Secondary | ICD-10-CM | POA: Diagnosis not present

## 2018-04-10 DIAGNOSIS — Z0000 Encounter for general adult medical examination without abnormal findings: Secondary | ICD-10-CM | POA: Diagnosis not present

## 2018-04-10 DIAGNOSIS — Z01419 Encounter for gynecological examination (general) (routine) without abnormal findings: Secondary | ICD-10-CM | POA: Diagnosis not present

## 2018-04-23 DIAGNOSIS — Z6830 Body mass index (BMI) 30.0-30.9, adult: Secondary | ICD-10-CM | POA: Diagnosis not present

## 2018-04-23 DIAGNOSIS — E785 Hyperlipidemia, unspecified: Secondary | ICD-10-CM | POA: Diagnosis not present

## 2018-04-23 DIAGNOSIS — Z299 Encounter for prophylactic measures, unspecified: Secondary | ICD-10-CM | POA: Diagnosis not present

## 2018-04-23 DIAGNOSIS — Z952 Presence of prosthetic heart valve: Secondary | ICD-10-CM | POA: Diagnosis not present

## 2018-04-23 DIAGNOSIS — R945 Abnormal results of liver function studies: Secondary | ICD-10-CM | POA: Diagnosis not present

## 2018-04-23 DIAGNOSIS — D649 Anemia, unspecified: Secondary | ICD-10-CM | POA: Diagnosis not present

## 2018-04-23 DIAGNOSIS — I10 Essential (primary) hypertension: Secondary | ICD-10-CM | POA: Diagnosis not present

## 2018-05-21 DIAGNOSIS — E1165 Type 2 diabetes mellitus with hyperglycemia: Secondary | ICD-10-CM | POA: Diagnosis not present

## 2018-05-21 DIAGNOSIS — Z6830 Body mass index (BMI) 30.0-30.9, adult: Secondary | ICD-10-CM | POA: Diagnosis not present

## 2018-05-21 DIAGNOSIS — I10 Essential (primary) hypertension: Secondary | ICD-10-CM | POA: Diagnosis not present

## 2018-05-21 DIAGNOSIS — Z952 Presence of prosthetic heart valve: Secondary | ICD-10-CM | POA: Diagnosis not present

## 2018-05-21 DIAGNOSIS — Z299 Encounter for prophylactic measures, unspecified: Secondary | ICD-10-CM | POA: Diagnosis not present

## 2018-05-26 DIAGNOSIS — Z952 Presence of prosthetic heart valve: Secondary | ICD-10-CM | POA: Diagnosis not present

## 2018-05-26 DIAGNOSIS — Z299 Encounter for prophylactic measures, unspecified: Secondary | ICD-10-CM | POA: Diagnosis not present

## 2018-05-26 DIAGNOSIS — I10 Essential (primary) hypertension: Secondary | ICD-10-CM | POA: Diagnosis not present

## 2018-05-26 DIAGNOSIS — Z6830 Body mass index (BMI) 30.0-30.9, adult: Secondary | ICD-10-CM | POA: Diagnosis not present

## 2018-05-29 LAB — BLOOD GAS, ARTERIAL
ACID-BASE DEFICIT: 2.7 mmol/L — AB (ref 0.0–2.0)
ACID-BASE DEFICIT: 6.2 mmol/L — AB (ref 0.0–2.0)
ACID-BASE EXCESS: 2.8 mmol/L — AB (ref 0.0–2.0)
Acid-base deficit: 2.5 mmol/L — ABNORMAL HIGH (ref 0.0–2.0)
BICARBONATE: 20.5 mmol/L (ref 20.0–28.0)
BICARBONATE: 22.2 mmol/L (ref 20.0–28.0)
BICARBONATE: 25.2 mmol/L (ref 20.0–28.0)
Bicarbonate: 28.3 mmol/L — ABNORMAL HIGH (ref 20.0–28.0)
DELIVERY SYSTEMS: POSITIVE
DRAWN BY: 51425
Drawn by: 51425
Drawn by: 51425
Drawn by: 514251
EXPIRATORY PAP: 8
FIO2: 100
FIO2: 100
FIO2: 60
FIO2: 60
INSPIRATORY PAP: 12
LHR: 20 {breaths}/min
LHR: 29 {breaths}/min
LHR: 35 {breaths}/min
MECHVT: 360 mL
MECHVT: 360 mL
O2 SAT: 98.8 %
O2 Saturation: 86.2 %
O2 Saturation: 90.2 %
O2 Saturation: 97.8 %
PATIENT TEMPERATURE: 99.7
PCO2 ART: 53.2 mmHg — AB (ref 32.0–48.0)
PCO2 ART: 46.3 mmHg (ref 32.0–48.0)
PEEP/CPAP: 14 cmH2O
PEEP: 14 cmH2O
PH ART: 7.349 — AB (ref 7.350–7.450)
PO2 ART: 141 mmHg — AB (ref 83.0–108.0)
Patient temperature: 103
Patient temperature: 97.4
Patient temperature: 98.6
RATE: 12 resp/min
VT: 470 mL
pCO2 arterial: 41.4 mmHg (ref 32.0–48.0)
pCO2 arterial: 69.8 mmHg (ref 32.0–48.0)
pH, Arterial: 7.199 — CL (ref 7.350–7.450)
pH, Arterial: 7.264 — ABNORMAL LOW (ref 7.350–7.450)
pH, Arterial: 7.348 — ABNORMAL LOW (ref 7.350–7.450)
pO2, Arterial: 113 mmHg — ABNORMAL HIGH (ref 83.0–108.0)
pO2, Arterial: 61.7 mmHg — ABNORMAL LOW (ref 83.0–108.0)
pO2, Arterial: 83.6 mmHg (ref 83.0–108.0)

## 2018-06-27 DIAGNOSIS — J209 Acute bronchitis, unspecified: Secondary | ICD-10-CM | POA: Diagnosis not present

## 2018-06-27 DIAGNOSIS — A419 Sepsis, unspecified organism: Secondary | ICD-10-CM | POA: Diagnosis not present

## 2018-06-27 DIAGNOSIS — E1165 Type 2 diabetes mellitus with hyperglycemia: Secondary | ICD-10-CM | POA: Diagnosis not present

## 2018-06-27 DIAGNOSIS — R0789 Other chest pain: Secondary | ICD-10-CM | POA: Diagnosis not present

## 2018-06-27 DIAGNOSIS — I10 Essential (primary) hypertension: Secondary | ICD-10-CM | POA: Diagnosis not present

## 2018-06-27 DIAGNOSIS — Z952 Presence of prosthetic heart valve: Secondary | ICD-10-CM | POA: Diagnosis not present

## 2018-06-27 DIAGNOSIS — J440 Chronic obstructive pulmonary disease with acute lower respiratory infection: Secondary | ICD-10-CM | POA: Diagnosis not present

## 2018-06-27 DIAGNOSIS — D72829 Elevated white blood cell count, unspecified: Secondary | ICD-10-CM | POA: Diagnosis not present

## 2018-06-27 DIAGNOSIS — R791 Abnormal coagulation profile: Secondary | ICD-10-CM | POA: Diagnosis not present

## 2018-06-27 DIAGNOSIS — R079 Chest pain, unspecified: Secondary | ICD-10-CM | POA: Diagnosis not present

## 2018-06-27 DIAGNOSIS — J441 Chronic obstructive pulmonary disease with (acute) exacerbation: Secondary | ICD-10-CM | POA: Diagnosis not present

## 2018-06-28 DIAGNOSIS — I10 Essential (primary) hypertension: Secondary | ICD-10-CM | POA: Diagnosis not present

## 2018-06-28 DIAGNOSIS — J209 Acute bronchitis, unspecified: Secondary | ICD-10-CM | POA: Diagnosis not present

## 2018-06-28 DIAGNOSIS — R079 Chest pain, unspecified: Secondary | ICD-10-CM | POA: Diagnosis not present

## 2018-06-28 DIAGNOSIS — J441 Chronic obstructive pulmonary disease with (acute) exacerbation: Secondary | ICD-10-CM | POA: Diagnosis not present

## 2018-06-29 DIAGNOSIS — R079 Chest pain, unspecified: Secondary | ICD-10-CM | POA: Diagnosis not present

## 2018-06-29 DIAGNOSIS — J441 Chronic obstructive pulmonary disease with (acute) exacerbation: Secondary | ICD-10-CM | POA: Diagnosis not present

## 2018-06-29 DIAGNOSIS — J209 Acute bronchitis, unspecified: Secondary | ICD-10-CM | POA: Diagnosis not present

## 2018-06-29 DIAGNOSIS — I10 Essential (primary) hypertension: Secondary | ICD-10-CM | POA: Diagnosis not present

## 2018-08-01 DIAGNOSIS — Z299 Encounter for prophylactic measures, unspecified: Secondary | ICD-10-CM | POA: Diagnosis not present

## 2018-08-01 DIAGNOSIS — Z6829 Body mass index (BMI) 29.0-29.9, adult: Secondary | ICD-10-CM | POA: Diagnosis not present

## 2018-08-01 DIAGNOSIS — F17210 Nicotine dependence, cigarettes, uncomplicated: Secondary | ICD-10-CM | POA: Diagnosis not present

## 2018-08-01 DIAGNOSIS — Z952 Presence of prosthetic heart valve: Secondary | ICD-10-CM | POA: Diagnosis not present

## 2018-08-01 DIAGNOSIS — F319 Bipolar disorder, unspecified: Secondary | ICD-10-CM | POA: Diagnosis not present

## 2018-08-01 DIAGNOSIS — I10 Essential (primary) hypertension: Secondary | ICD-10-CM | POA: Diagnosis not present

## 2018-08-01 DIAGNOSIS — F329 Major depressive disorder, single episode, unspecified: Secondary | ICD-10-CM | POA: Diagnosis not present

## 2018-08-01 DIAGNOSIS — E1165 Type 2 diabetes mellitus with hyperglycemia: Secondary | ICD-10-CM | POA: Diagnosis not present

## 2018-08-01 DIAGNOSIS — E785 Hyperlipidemia, unspecified: Secondary | ICD-10-CM | POA: Diagnosis not present

## 2018-08-15 DIAGNOSIS — Z79891 Long term (current) use of opiate analgesic: Secondary | ICD-10-CM | POA: Diagnosis not present

## 2018-08-15 DIAGNOSIS — F3131 Bipolar disorder, current episode depressed, mild: Secondary | ICD-10-CM | POA: Diagnosis not present

## 2018-08-22 DIAGNOSIS — F17210 Nicotine dependence, cigarettes, uncomplicated: Secondary | ICD-10-CM | POA: Diagnosis not present

## 2018-08-22 DIAGNOSIS — E1165 Type 2 diabetes mellitus with hyperglycemia: Secondary | ICD-10-CM | POA: Diagnosis not present

## 2018-08-22 DIAGNOSIS — Z6831 Body mass index (BMI) 31.0-31.9, adult: Secondary | ICD-10-CM | POA: Diagnosis not present

## 2018-08-22 DIAGNOSIS — I10 Essential (primary) hypertension: Secondary | ICD-10-CM | POA: Diagnosis not present

## 2018-08-22 DIAGNOSIS — Z299 Encounter for prophylactic measures, unspecified: Secondary | ICD-10-CM | POA: Diagnosis not present

## 2018-08-22 DIAGNOSIS — F319 Bipolar disorder, unspecified: Secondary | ICD-10-CM | POA: Diagnosis not present

## 2018-08-22 DIAGNOSIS — J069 Acute upper respiratory infection, unspecified: Secondary | ICD-10-CM | POA: Diagnosis not present

## 2018-08-25 DIAGNOSIS — F3131 Bipolar disorder, current episode depressed, mild: Secondary | ICD-10-CM | POA: Diagnosis not present

## 2018-08-29 DIAGNOSIS — Z299 Encounter for prophylactic measures, unspecified: Secondary | ICD-10-CM | POA: Diagnosis not present

## 2018-08-29 DIAGNOSIS — F319 Bipolar disorder, unspecified: Secondary | ICD-10-CM | POA: Diagnosis not present

## 2018-08-29 DIAGNOSIS — Z6831 Body mass index (BMI) 31.0-31.9, adult: Secondary | ICD-10-CM | POA: Diagnosis not present

## 2018-08-29 DIAGNOSIS — I10 Essential (primary) hypertension: Secondary | ICD-10-CM | POA: Diagnosis not present

## 2018-08-29 DIAGNOSIS — E1165 Type 2 diabetes mellitus with hyperglycemia: Secondary | ICD-10-CM | POA: Diagnosis not present

## 2018-08-29 DIAGNOSIS — Z952 Presence of prosthetic heart valve: Secondary | ICD-10-CM | POA: Diagnosis not present

## 2018-09-15 DIAGNOSIS — F3131 Bipolar disorder, current episode depressed, mild: Secondary | ICD-10-CM | POA: Diagnosis not present

## 2018-09-29 DIAGNOSIS — F3131 Bipolar disorder, current episode depressed, mild: Secondary | ICD-10-CM | POA: Diagnosis not present

## 2018-10-14 DIAGNOSIS — F3131 Bipolar disorder, current episode depressed, mild: Secondary | ICD-10-CM | POA: Diagnosis not present

## 2018-10-16 DIAGNOSIS — F3131 Bipolar disorder, current episode depressed, mild: Secondary | ICD-10-CM | POA: Diagnosis not present

## 2018-10-27 DIAGNOSIS — R110 Nausea: Secondary | ICD-10-CM | POA: Diagnosis not present

## 2018-10-27 DIAGNOSIS — J069 Acute upper respiratory infection, unspecified: Secondary | ICD-10-CM | POA: Diagnosis not present

## 2018-11-06 DIAGNOSIS — R0602 Shortness of breath: Secondary | ICD-10-CM | POA: Diagnosis not present

## 2018-11-06 DIAGNOSIS — I5033 Acute on chronic diastolic (congestive) heart failure: Secondary | ICD-10-CM | POA: Diagnosis not present

## 2018-11-06 DIAGNOSIS — E872 Acidosis: Secondary | ICD-10-CM | POA: Diagnosis not present

## 2018-11-06 DIAGNOSIS — E785 Hyperlipidemia, unspecified: Secondary | ICD-10-CM | POA: Diagnosis not present

## 2018-11-06 DIAGNOSIS — J9600 Acute respiratory failure, unspecified whether with hypoxia or hypercapnia: Secondary | ICD-10-CM | POA: Diagnosis not present

## 2018-11-06 DIAGNOSIS — I110 Hypertensive heart disease with heart failure: Secondary | ICD-10-CM | POA: Diagnosis not present

## 2018-11-06 DIAGNOSIS — A419 Sepsis, unspecified organism: Secondary | ICD-10-CM | POA: Diagnosis not present

## 2018-11-06 DIAGNOSIS — I5021 Acute systolic (congestive) heart failure: Secondary | ICD-10-CM | POA: Diagnosis not present

## 2018-11-06 DIAGNOSIS — K219 Gastro-esophageal reflux disease without esophagitis: Secondary | ICD-10-CM | POA: Diagnosis not present

## 2018-11-06 DIAGNOSIS — E871 Hypo-osmolality and hyponatremia: Secondary | ICD-10-CM | POA: Diagnosis not present

## 2018-11-06 DIAGNOSIS — J449 Chronic obstructive pulmonary disease, unspecified: Secondary | ICD-10-CM | POA: Diagnosis not present

## 2018-11-06 DIAGNOSIS — E1165 Type 2 diabetes mellitus with hyperglycemia: Secondary | ICD-10-CM | POA: Diagnosis not present

## 2018-11-06 DIAGNOSIS — U071 COVID-19: Secondary | ICD-10-CM | POA: Diagnosis not present

## 2018-11-06 DIAGNOSIS — J9601 Acute respiratory failure with hypoxia: Secondary | ICD-10-CM | POA: Diagnosis not present

## 2018-11-12 DIAGNOSIS — F3131 Bipolar disorder, current episode depressed, mild: Secondary | ICD-10-CM | POA: Diagnosis not present

## 2018-11-18 DIAGNOSIS — Z299 Encounter for prophylactic measures, unspecified: Secondary | ICD-10-CM | POA: Diagnosis not present

## 2018-11-18 DIAGNOSIS — I10 Essential (primary) hypertension: Secondary | ICD-10-CM | POA: Diagnosis not present

## 2018-11-18 DIAGNOSIS — Z09 Encounter for follow-up examination after completed treatment for conditions other than malignant neoplasm: Secondary | ICD-10-CM | POA: Diagnosis not present

## 2018-11-18 DIAGNOSIS — I509 Heart failure, unspecified: Secondary | ICD-10-CM | POA: Diagnosis not present

## 2018-11-18 DIAGNOSIS — Z6832 Body mass index (BMI) 32.0-32.9, adult: Secondary | ICD-10-CM | POA: Diagnosis not present

## 2018-11-18 DIAGNOSIS — E1165 Type 2 diabetes mellitus with hyperglycemia: Secondary | ICD-10-CM | POA: Diagnosis not present

## 2018-12-03 DIAGNOSIS — F3131 Bipolar disorder, current episode depressed, mild: Secondary | ICD-10-CM | POA: Diagnosis not present

## 2018-12-05 DIAGNOSIS — Z299 Encounter for prophylactic measures, unspecified: Secondary | ICD-10-CM | POA: Diagnosis not present

## 2018-12-05 DIAGNOSIS — I10 Essential (primary) hypertension: Secondary | ICD-10-CM | POA: Diagnosis not present

## 2018-12-05 DIAGNOSIS — Z952 Presence of prosthetic heart valve: Secondary | ICD-10-CM | POA: Diagnosis not present

## 2018-12-05 DIAGNOSIS — Z6831 Body mass index (BMI) 31.0-31.9, adult: Secondary | ICD-10-CM | POA: Diagnosis not present

## 2018-12-05 DIAGNOSIS — E1165 Type 2 diabetes mellitus with hyperglycemia: Secondary | ICD-10-CM | POA: Diagnosis not present

## 2018-12-05 DIAGNOSIS — I509 Heart failure, unspecified: Secondary | ICD-10-CM | POA: Diagnosis not present

## 2018-12-05 DIAGNOSIS — J449 Chronic obstructive pulmonary disease, unspecified: Secondary | ICD-10-CM | POA: Diagnosis not present

## 2018-12-31 DIAGNOSIS — I060 Rheumatic aortic stenosis: Secondary | ICD-10-CM | POA: Diagnosis not present

## 2018-12-31 DIAGNOSIS — E782 Mixed hyperlipidemia: Secondary | ICD-10-CM | POA: Diagnosis not present

## 2018-12-31 DIAGNOSIS — Z952 Presence of prosthetic heart valve: Secondary | ICD-10-CM | POA: Diagnosis not present

## 2018-12-31 DIAGNOSIS — I4891 Unspecified atrial fibrillation: Secondary | ICD-10-CM | POA: Diagnosis not present

## 2019-01-13 DIAGNOSIS — F3131 Bipolar disorder, current episode depressed, mild: Secondary | ICD-10-CM | POA: Diagnosis not present

## 2019-01-23 DIAGNOSIS — F3131 Bipolar disorder, current episode depressed, mild: Secondary | ICD-10-CM | POA: Diagnosis not present

## 2019-02-10 DIAGNOSIS — J449 Chronic obstructive pulmonary disease, unspecified: Secondary | ICD-10-CM | POA: Diagnosis not present

## 2019-02-12 DIAGNOSIS — J449 Chronic obstructive pulmonary disease, unspecified: Secondary | ICD-10-CM | POA: Diagnosis not present

## 2019-02-12 DIAGNOSIS — E119 Type 2 diabetes mellitus without complications: Secondary | ICD-10-CM | POA: Diagnosis not present

## 2019-02-12 DIAGNOSIS — I509 Heart failure, unspecified: Secondary | ICD-10-CM | POA: Diagnosis not present

## 2019-02-28 DIAGNOSIS — R0603 Acute respiratory distress: Secondary | ICD-10-CM | POA: Diagnosis not present

## 2019-02-28 DIAGNOSIS — J441 Chronic obstructive pulmonary disease with (acute) exacerbation: Secondary | ICD-10-CM | POA: Diagnosis not present

## 2019-02-28 DIAGNOSIS — E1165 Type 2 diabetes mellitus with hyperglycemia: Secondary | ICD-10-CM | POA: Diagnosis not present

## 2019-02-28 DIAGNOSIS — Z03818 Encounter for observation for suspected exposure to other biological agents ruled out: Secondary | ICD-10-CM | POA: Diagnosis not present

## 2019-02-28 DIAGNOSIS — I509 Heart failure, unspecified: Secondary | ICD-10-CM | POA: Diagnosis not present

## 2019-02-28 DIAGNOSIS — I5033 Acute on chronic diastolic (congestive) heart failure: Secondary | ICD-10-CM | POA: Diagnosis not present

## 2019-02-28 DIAGNOSIS — I110 Hypertensive heart disease with heart failure: Secondary | ICD-10-CM | POA: Diagnosis not present

## 2019-02-28 DIAGNOSIS — R079 Chest pain, unspecified: Secondary | ICD-10-CM | POA: Diagnosis not present

## 2019-02-28 DIAGNOSIS — D649 Anemia, unspecified: Secondary | ICD-10-CM | POA: Diagnosis not present

## 2019-02-28 DIAGNOSIS — R072 Precordial pain: Secondary | ICD-10-CM | POA: Diagnosis not present

## 2019-02-28 DIAGNOSIS — G4733 Obstructive sleep apnea (adult) (pediatric): Secondary | ICD-10-CM | POA: Diagnosis not present

## 2019-02-28 DIAGNOSIS — J9601 Acute respiratory failure with hypoxia: Secondary | ICD-10-CM | POA: Diagnosis not present

## 2019-02-28 DIAGNOSIS — J9620 Acute and chronic respiratory failure, unspecified whether with hypoxia or hypercapnia: Secondary | ICD-10-CM | POA: Diagnosis not present

## 2019-02-28 DIAGNOSIS — J9621 Acute and chronic respiratory failure with hypoxia: Secondary | ICD-10-CM | POA: Diagnosis not present

## 2019-02-28 DIAGNOSIS — A419 Sepsis, unspecified organism: Secondary | ICD-10-CM | POA: Diagnosis not present

## 2019-02-28 DIAGNOSIS — R740 Nonspecific elevation of levels of transaminase and lactic acid dehydrogenase [LDH]: Secondary | ICD-10-CM | POA: Diagnosis not present

## 2019-02-28 DIAGNOSIS — R791 Abnormal coagulation profile: Secondary | ICD-10-CM | POA: Diagnosis not present

## 2019-03-12 DIAGNOSIS — J449 Chronic obstructive pulmonary disease, unspecified: Secondary | ICD-10-CM | POA: Diagnosis not present

## 2019-03-12 DIAGNOSIS — F17210 Nicotine dependence, cigarettes, uncomplicated: Secondary | ICD-10-CM | POA: Diagnosis not present

## 2019-03-12 DIAGNOSIS — F319 Bipolar disorder, unspecified: Secondary | ICD-10-CM | POA: Diagnosis not present

## 2019-03-12 DIAGNOSIS — Z952 Presence of prosthetic heart valve: Secondary | ICD-10-CM | POA: Diagnosis not present

## 2019-03-12 DIAGNOSIS — Z6833 Body mass index (BMI) 33.0-33.9, adult: Secondary | ICD-10-CM | POA: Diagnosis not present

## 2019-03-12 DIAGNOSIS — E119 Type 2 diabetes mellitus without complications: Secondary | ICD-10-CM | POA: Diagnosis not present

## 2019-03-12 DIAGNOSIS — I10 Essential (primary) hypertension: Secondary | ICD-10-CM | POA: Diagnosis not present

## 2019-03-12 DIAGNOSIS — J189 Pneumonia, unspecified organism: Secondary | ICD-10-CM | POA: Diagnosis not present

## 2019-03-12 DIAGNOSIS — Z299 Encounter for prophylactic measures, unspecified: Secondary | ICD-10-CM | POA: Diagnosis not present

## 2019-03-12 DIAGNOSIS — E1165 Type 2 diabetes mellitus with hyperglycemia: Secondary | ICD-10-CM | POA: Diagnosis not present

## 2019-03-12 DIAGNOSIS — J9611 Chronic respiratory failure with hypoxia: Secondary | ICD-10-CM | POA: Diagnosis not present

## 2019-03-12 DIAGNOSIS — I509 Heart failure, unspecified: Secondary | ICD-10-CM | POA: Diagnosis not present

## 2019-03-13 DIAGNOSIS — J449 Chronic obstructive pulmonary disease, unspecified: Secondary | ICD-10-CM | POA: Diagnosis not present

## 2019-03-17 DIAGNOSIS — Z299 Encounter for prophylactic measures, unspecified: Secondary | ICD-10-CM | POA: Diagnosis not present

## 2019-03-17 DIAGNOSIS — R05 Cough: Secondary | ICD-10-CM | POA: Diagnosis not present

## 2019-03-17 DIAGNOSIS — I509 Heart failure, unspecified: Secondary | ICD-10-CM | POA: Diagnosis not present

## 2019-03-17 DIAGNOSIS — F319 Bipolar disorder, unspecified: Secondary | ICD-10-CM | POA: Diagnosis not present

## 2019-03-17 DIAGNOSIS — E1165 Type 2 diabetes mellitus with hyperglycemia: Secondary | ICD-10-CM | POA: Diagnosis not present

## 2019-03-17 DIAGNOSIS — F17210 Nicotine dependence, cigarettes, uncomplicated: Secondary | ICD-10-CM | POA: Diagnosis not present

## 2019-03-27 ENCOUNTER — Other Ambulatory Visit: Payer: Self-pay

## 2019-03-27 ENCOUNTER — Inpatient Hospital Stay (HOSPITAL_COMMUNITY)
Admission: EM | Admit: 2019-03-27 | Discharge: 2019-03-29 | DRG: 291 | Disposition: A | Discharge status: Home / Self Care | Payer: Medicare PPO | Attending: Internal Medicine | Admitting: Internal Medicine

## 2019-03-27 ENCOUNTER — Emergency Department (HOSPITAL_COMMUNITY): Payer: Medicare PPO

## 2019-03-27 ENCOUNTER — Encounter (HOSPITAL_COMMUNITY): Payer: Self-pay | Admitting: Emergency Medicine

## 2019-03-27 DIAGNOSIS — Z20828 Contact with and (suspected) exposure to other viral communicable diseases: Secondary | ICD-10-CM | POA: Diagnosis present

## 2019-03-27 DIAGNOSIS — F329 Major depressive disorder, single episode, unspecified: Secondary | ICD-10-CM | POA: Diagnosis present

## 2019-03-27 DIAGNOSIS — E785 Hyperlipidemia, unspecified: Secondary | ICD-10-CM | POA: Diagnosis present

## 2019-03-27 DIAGNOSIS — Z888 Allergy status to other drugs, medicaments and biological substances status: Secondary | ICD-10-CM

## 2019-03-27 DIAGNOSIS — Z825 Family history of asthma and other chronic lower respiratory diseases: Secondary | ICD-10-CM

## 2019-03-27 DIAGNOSIS — Z9981 Dependence on supplemental oxygen: Secondary | ICD-10-CM | POA: Diagnosis not present

## 2019-03-27 DIAGNOSIS — Z8249 Family history of ischemic heart disease and other diseases of the circulatory system: Secondary | ICD-10-CM

## 2019-03-27 DIAGNOSIS — Z886 Allergy status to analgesic agent status: Secondary | ICD-10-CM | POA: Diagnosis not present

## 2019-03-27 DIAGNOSIS — Z882 Allergy status to sulfonamides status: Secondary | ICD-10-CM

## 2019-03-27 DIAGNOSIS — J9621 Acute and chronic respiratory failure with hypoxia: Secondary | ICD-10-CM | POA: Diagnosis present

## 2019-03-27 DIAGNOSIS — Z800 Family history of malignant neoplasm of digestive organs: Secondary | ICD-10-CM

## 2019-03-27 DIAGNOSIS — Z9049 Acquired absence of other specified parts of digestive tract: Secondary | ICD-10-CM | POA: Diagnosis not present

## 2019-03-27 DIAGNOSIS — I5033 Acute on chronic diastolic (congestive) heart failure: Principal | ICD-10-CM | POA: Diagnosis present

## 2019-03-27 DIAGNOSIS — G4733 Obstructive sleep apnea (adult) (pediatric): Secondary | ICD-10-CM | POA: Diagnosis present

## 2019-03-27 DIAGNOSIS — E1165 Type 2 diabetes mellitus with hyperglycemia: Secondary | ICD-10-CM | POA: Diagnosis not present

## 2019-03-27 DIAGNOSIS — Z885 Allergy status to narcotic agent status: Secondary | ICD-10-CM | POA: Diagnosis not present

## 2019-03-27 DIAGNOSIS — Z8541 Personal history of malignant neoplasm of cervix uteri: Secondary | ICD-10-CM | POA: Diagnosis not present

## 2019-03-27 DIAGNOSIS — E119 Type 2 diabetes mellitus without complications: Secondary | ICD-10-CM | POA: Diagnosis not present

## 2019-03-27 DIAGNOSIS — D649 Anemia, unspecified: Secondary | ICD-10-CM | POA: Diagnosis not present

## 2019-03-27 DIAGNOSIS — J310 Chronic rhinitis: Secondary | ICD-10-CM | POA: Diagnosis present

## 2019-03-27 DIAGNOSIS — J9601 Acute respiratory failure with hypoxia: Secondary | ICD-10-CM

## 2019-03-27 DIAGNOSIS — Z7982 Long term (current) use of aspirin: Secondary | ICD-10-CM

## 2019-03-27 DIAGNOSIS — D509 Iron deficiency anemia, unspecified: Secondary | ICD-10-CM | POA: Diagnosis present

## 2019-03-27 DIAGNOSIS — J449 Chronic obstructive pulmonary disease, unspecified: Secondary | ICD-10-CM | POA: Diagnosis present

## 2019-03-27 DIAGNOSIS — R0902 Hypoxemia: Secondary | ICD-10-CM | POA: Diagnosis not present

## 2019-03-27 DIAGNOSIS — K219 Gastro-esophageal reflux disease without esophagitis: Secondary | ICD-10-CM | POA: Diagnosis present

## 2019-03-27 DIAGNOSIS — F410 Panic disorder [episodic paroxysmal anxiety] without agoraphobia: Secondary | ICD-10-CM | POA: Diagnosis not present

## 2019-03-27 DIAGNOSIS — I361 Nonrheumatic tricuspid (valve) insufficiency: Secondary | ICD-10-CM | POA: Diagnosis not present

## 2019-03-27 DIAGNOSIS — I509 Heart failure, unspecified: Secondary | ICD-10-CM | POA: Diagnosis not present

## 2019-03-27 DIAGNOSIS — F319 Bipolar disorder, unspecified: Secondary | ICD-10-CM | POA: Diagnosis not present

## 2019-03-27 DIAGNOSIS — F17210 Nicotine dependence, cigarettes, uncomplicated: Secondary | ICD-10-CM | POA: Diagnosis present

## 2019-03-27 DIAGNOSIS — Z823 Family history of stroke: Secondary | ICD-10-CM

## 2019-03-27 DIAGNOSIS — Z299 Encounter for prophylactic measures, unspecified: Secondary | ICD-10-CM | POA: Diagnosis not present

## 2019-03-27 DIAGNOSIS — Z79899 Other long term (current) drug therapy: Secondary | ICD-10-CM

## 2019-03-27 DIAGNOSIS — Z952 Presence of prosthetic heart valve: Secondary | ICD-10-CM | POA: Diagnosis not present

## 2019-03-27 DIAGNOSIS — Z6833 Body mass index (BMI) 33.0-33.9, adult: Secondary | ICD-10-CM | POA: Diagnosis not present

## 2019-03-27 DIAGNOSIS — I110 Hypertensive heart disease with heart failure: Secondary | ICD-10-CM | POA: Diagnosis present

## 2019-03-27 DIAGNOSIS — R0602 Shortness of breath: Secondary | ICD-10-CM

## 2019-03-27 DIAGNOSIS — J309 Allergic rhinitis, unspecified: Secondary | ICD-10-CM | POA: Diagnosis not present

## 2019-03-27 DIAGNOSIS — D539 Nutritional anemia, unspecified: Secondary | ICD-10-CM | POA: Diagnosis present

## 2019-03-27 DIAGNOSIS — Z7984 Long term (current) use of oral hypoglycemic drugs: Secondary | ICD-10-CM

## 2019-03-27 LAB — URINALYSIS, ROUTINE W REFLEX MICROSCOPIC
Bilirubin Urine: NEGATIVE
Glucose, UA: NEGATIVE mg/dL
Hgb urine dipstick: NEGATIVE
Ketones, ur: NEGATIVE mg/dL
Nitrite: NEGATIVE
Protein, ur: NEGATIVE mg/dL
Specific Gravity, Urine: 1.005 (ref 1.005–1.030)
pH: 6 (ref 5.0–8.0)

## 2019-03-27 LAB — CBC WITH DIFFERENTIAL/PLATELET
Abs Immature Granulocytes: 0.11 10*3/uL — ABNORMAL HIGH (ref 0.00–0.07)
Basophils Absolute: 0 10*3/uL (ref 0.0–0.1)
Basophils Relative: 0 %
Eosinophils Absolute: 0.3 10*3/uL (ref 0.0–0.5)
Eosinophils Relative: 2 %
HCT: 27.4 % — ABNORMAL LOW (ref 36.0–46.0)
Hemoglobin: 7.8 g/dL — ABNORMAL LOW (ref 12.0–15.0)
Immature Granulocytes: 1 %
Lymphocytes Relative: 14 %
Lymphs Abs: 1.8 10*3/uL (ref 0.7–4.0)
MCH: 28 pg (ref 26.0–34.0)
MCHC: 28.5 g/dL — ABNORMAL LOW (ref 30.0–36.0)
MCV: 98.2 fL (ref 80.0–100.0)
Monocytes Absolute: 0.6 10*3/uL (ref 0.1–1.0)
Monocytes Relative: 5 %
Neutro Abs: 10 10*3/uL — ABNORMAL HIGH (ref 1.7–7.7)
Neutrophils Relative %: 78 %
Platelets: 357 10*3/uL (ref 150–400)
RBC: 2.79 MIL/uL — ABNORMAL LOW (ref 3.87–5.11)
RDW: 15.8 % — ABNORMAL HIGH (ref 11.5–15.5)
WBC: 12.9 10*3/uL — ABNORMAL HIGH (ref 4.0–10.5)
nRBC: 0 % (ref 0.0–0.2)

## 2019-03-27 LAB — SARS CORONAVIRUS 2 BY RT PCR (HOSPITAL ORDER, PERFORMED IN ~~LOC~~ HOSPITAL LAB): SARS Coronavirus 2: NEGATIVE

## 2019-03-27 LAB — LACTIC ACID, PLASMA
Lactic Acid, Venous: 1.3 mmol/L (ref 0.5–1.9)
Lactic Acid, Venous: 1 mmol/L (ref 0.5–1.9)

## 2019-03-27 LAB — BASIC METABOLIC PANEL
Anion gap: 12 (ref 5–15)
BUN: 8 mg/dL (ref 6–20)
CO2: 21 mmol/L — ABNORMAL LOW (ref 22–32)
Calcium: 8.9 mg/dL (ref 8.9–10.3)
Chloride: 103 mmol/L (ref 98–111)
Creatinine, Ser: 0.87 mg/dL (ref 0.44–1.00)
GFR calc Af Amer: >60 mL/min (ref >60)
GFR calc non Af Amer: >60 mL/min (ref >60)
Glucose, Bld: 176 mg/dL — ABNORMAL HIGH (ref 70–99)
Potassium: 3.7 mmol/L (ref 3.5–5.1)
Sodium: 136 mmol/L (ref 135–145)

## 2019-03-27 MED ORDER — ALBUTEROL SULFATE (2.5 MG/3ML) 0.083% IN NEBU: 5.0000 mg | INHALATION_SOLUTION | Freq: Once | RESPIRATORY_TRACT | Status: DC

## 2019-03-27 MED ORDER — INSULIN ASPART 100 UNIT/ML ~~LOC~~ SOLN
0.0000 [IU] | Freq: Three times a day (TID) | SUBCUTANEOUS | Status: DC
  Administered 2019-03-28: 12:00:00 3 [IU] via SUBCUTANEOUS
  Administered 2019-03-28: 7 [IU] via SUBCUTANEOUS
  Administered 2019-03-28: 17:00:00 2 [IU] via SUBCUTANEOUS
  Administered 2019-03-29: 08:00:00 1 [IU] via SUBCUTANEOUS
  Administered 2019-03-29: 2 [IU] via SUBCUTANEOUS

## 2019-03-27 MED ORDER — ENOXAPARIN SODIUM 40 MG/0.4ML ~~LOC~~ SOLN
40.0000 mg | SUBCUTANEOUS | Status: DC
  Administered 2019-03-27: 40 mg via SUBCUTANEOUS
  Filled 2019-03-27: qty 0.4

## 2019-03-27 MED ORDER — IPRATROPIUM-ALBUTEROL 20-100 MCG/ACT IN AERS
1.0000 | INHALATION_SPRAY | Freq: Four times a day (QID) | RESPIRATORY_TRACT | Status: DC
  Administered 2019-03-27: 1 via RESPIRATORY_TRACT
  Filled 2019-03-27: qty 4

## 2019-03-27 MED ORDER — ALBUTEROL SULFATE (2.5 MG/3ML) 0.083% IN NEBU
INHALATION_SOLUTION | RESPIRATORY_TRACT | Status: AC
  Administered 2019-03-27: 2.5 mg
  Filled 2019-03-27: qty 3

## 2019-03-27 MED ORDER — INSULIN ASPART 100 UNIT/ML ~~LOC~~ SOLN
0.0000 [IU] | Freq: Every day | SUBCUTANEOUS | Status: DC
  Administered 2019-03-28: 01:00:00 3 [IU] via SUBCUTANEOUS
  Filled 2019-03-27: qty 1

## 2019-03-27 MED ORDER — METHYLPREDNISOLONE SODIUM SUCC 125 MG IJ SOLR
125.0000 mg | Freq: Once | INTRAMUSCULAR | Status: AC
  Administered 2019-03-27: 125 mg via INTRAVENOUS
  Filled 2019-03-27: qty 2

## 2019-03-27 MED ORDER — IPRATROPIUM-ALBUTEROL 0.5-2.5 (3) MG/3ML IN SOLN: 3.0000 mL | RESPIRATORY_TRACT | Status: DC | PRN

## 2019-03-27 MED ORDER — ALBUTEROL SULFATE (2.5 MG/3ML) 0.083% IN NEBU: 2.5000 mg | INHALATION_SOLUTION | RESPIRATORY_TRACT | Status: DC | PRN

## 2019-03-27 NOTE — H&P (Addendum)
TRH H&P    Patient Demographics:    Holly Figueroa, is a 53 y.o. female  MRN: [id number]  DOB - 03-10-1966  Admit Date - 03/27/2019  Referring MD/NP/PA:  Suella Broad  Outpatient Primary MD for the patient is Glenda Chroman, MD  Patient coming from:  home  Chief complaint-  dyspnea   HPI:    Holly Figueroa  is a 53 y.o. female, w anxiety/ depression, gerd,  Iron deficiency anemia, hypertension, hyperlipidemia,  Dm2,  chronic diastolic CHF, h/o mitral stenosis s/p AVR/ MVR, post-op Afib,  Copd, OSA , recent pneumonia presents with 2 weeks of dyspnea, and increase in lower ext edema. Pt is not sure about her weight.  Pt doesn't admit to orthopnea, pnd. Slight cough,  Pt denies fever, chills, cp, palp, n/v, abd pain, diarrhea, brbpr.   Pt presents due to dyspnea.   In ED<  T 97.8, P 77, R 20, Bp 124/52, pox 87% on RA WT 81.6kg  Covid-19 negative  Na 136, K 3.7, Bun 8, Creatinine 0.87 Wbc 12.9, hgb 7.8, Plt 357 Lactic acid 1.3 Blood culture x2  CXR IMPRESSION: 1. Stable cardiomegaly and mild chronic bronchitic changes. 2. No acute abnormality.  Pt will be admitted for acute respiratory failure w hypoxia secondary to CHF.    Review of systems:    In addition to the HPI above,  No Fever-chills, No Headache, No changes with Vision or hearing, No problems swallowing food or Liquids, No Chest pain,  No Abdominal pain, No Nausea or Vomiting, bowel movements are regular, No Blood in stool or Urine, No dysuria, No new skin rashes or bruises, No new joints pains-aches,  No new weakness, tingling, numbness in any extremity, No recent weight gain or loss, No polyuria, polydypsia or polyphagia, No significant Mental Stressors.  All other systems reviewed and are negative.    Past History of the following :    Past Medical History:  Diagnosis Date   Acute respiratory failure (Echo)     Anemia 08-2008   Blood transfusion   Anxiety    Benzodiazepine dependence (HCC)    Cervical cancer (HCC)    CHF (congestive heart failure) (HCC)    Depression    Diabetes mellitus without complication (HCC)    ZMCEYEMV(361.2)    migraines   Hypokalemia    Legionella pneumonia (HCC)    Leukocytosis, unspecified    Mitral stenosis    Panic attacks    Tobacco abuse       Past Surgical History:  Procedure Laterality Date   BIOPSY  06/12/2017   Procedure: BIOPSY;  Surgeon: Daneil Dolin, MD;  Location: [facility];  Service: Gastroenterology;;  esophagus   cardiac valve repair  2011   stretched mitral valve   CERVICAL CONIZATION W/BX  2000   CHOLECYSTECTOMY  06/26/2012   Procedure: LAPAROSCOPIC CHOLECYSTECTOMY WITH INTRAOPERATIVE CHOLANGIOGRAM;  Surgeon: Ralene Ok, MD;  Location: [facility];  Service: General;  Laterality: N/A;   COLONOSCOPY WITH PROPOFOL N/A 06/12/2017   Normal colon, normal TI, Grade  I internal hemorrhoids   ESOPHAGOGASTRODUODENOSCOPY (EGD) WITH PROPOFOL N/A 06/12/2017   esophagitis, possibly chemical or pill-induced, s/p dilation. small hiatal hernia, normal duodenal bulb and D 2   GIVENS CAPSULE STUDY N/A 07/19/2017   incomplete capsule study as did not reach cecum.    LASER ABLATION OF THE CERVIX     MALONEY DILATION  06/12/2017   Procedure: MALONEY DILATION;  Surgeon: Daneil Dolin, MD;  Location: [facility];  Service: Gastroenterology;;   MOLE REMOVAL     NASAL SINUS SURGERY        Social History:      Social History   Tobacco Use   Smoking status: Current Every Day Smoker    Packs/day: 1.00    Years: 23.00    Pack years: 23.00    Types: Cigarettes    Last attempt to quit: 05/06/2017    Years since quitting: 1.8   Smokeless tobacco: Never Used  Substance Use Topics   Alcohol use: No    Alcohol/week: 0.0 standard drinks       Family History :     Family History  Problem Relation Age of Onset    Heart disease Father    COPD Father    Heart failure Maternal Grandmother    Stroke Mother    Colon cancer Brother 38       deceased    Colon polyps Neg Hx       Home Medications:   Prior to Admission medications   Medication Sig Start Date End Date Taking? Authorizing Provider  albuterol (PROVENTIL HFA;VENTOLIN HFA) 108 (90 Base) MCG/ACT inhaler Inhale 1-2 puffs into the lungs every 6 (six) hours as needed for wheezing or shortness of breath (with Aerochamber).    [provider]  ALPRAZolam Duanne Moron) 1 MG tablet Take 0.5 tablets (0.5 mg total) by mouth 3 (three) times daily. anxiety Patient taking differently: Take 1 mg by mouth 3 (three) times daily. anxiety 11/08/14   Donita Brooks, NP  aspirin EC 81 MG tablet Take 81 mg by mouth daily after breakfast.     [provider]  Cholecalciferol (VITAMIN D-3) 5000 units TABS Take 5,000 Units by mouth daily after breakfast.     [provider]  Cyanocobalamin (VITAMIN B-12 PO) Take 1 tablet by mouth daily after breakfast.     [provider]  FLUoxetine (PROZAC) 40 MG capsule Take 80 mg by mouth daily after breakfast.     [provider]  furosemide (LASIX) 20 MG tablet Take 20 mg by mouth daily as needed for fluid.    [provider]  hydrocortisone (ANUSOL-HC) 25 MG suppository Place 1 suppository (25 mg total) rectally 4 (four) times daily -  with meals and at bedtime. Patient taking differently: Place 25 mg rectally 2 (two) times daily as needed for hemorrhoids or anal itching.  08/21/17   Murlean Iba, MD  Maltodextrin-Xanthan Gum ([place]) POWD To be mixed with liquids 09/18/17   Domenic Polite, MD  metFORMIN (GLUCOPHAGE) 500 MG tablet Take 1 tablet (500 mg total) by mouth 2 (two) times daily with a meal. Patient taking differently: Take 1,000 mg by mouth 2 (two) times daily with a meal.  02/08/17   Pattricia Boss, MD  metoprolol tartrate (LOPRESSOR) 50 MG  tablet Take 1 tablet (50 mg total) by mouth 2 (two) times daily. 09/18/17   Domenic Polite, MD  pantoprazole (PROTONIX) 40 MG tablet Take 40 mg by mouth daily after breakfast.  [provider]  potassium chloride SA (K-DUR,KLOR-CON) 20 MEQ tablet Take 1 tablet by mouth daily as needed (when taken with Lasix for fluid).  12/04/10   [provider]  promethazine (PHENERGAN) 12.5 MG tablet Take 1 tablet (12.5 mg total) by mouth every 6 (six) hours as needed for nausea or vomiting. 08/21/17   Wynetta Emery, Clanford L, MD  rosuvastatin (CRESTOR) 10 MG tablet Take 10 mg by mouth daily after breakfast.  04/15/17   [provider]  Spacer/Aero-Holding Chambers (AEROCHAMBER PLUS FLO-VU MEDIUM) MISC 1 each by Other route every 6 (six) hours as needed (sob,wheezing).     [provider]  traZODone (DESYREL) 50 MG tablet Take 100 mg by mouth at bedtime.     [provider]  vitamin C (ASCORBIC ACID) 500 MG tablet Take 500 mg by mouth daily after breakfast.     [provider]     Allergies:     Allergies  Allergen Reactions   Iodinated Diagnostic Agents Anaphylaxis   Aspirin Other (See Comments)    "Possible blood in stool" the 340m. Can take 857m   Ibuprofen Other (See Comments)    "Possible blood in stool"   Sulfa Antibiotics Hives   Zofran [Ondansetron Hcl]     Bad headaches   Dilaudid [Hydromorphone Hcl] Itching and Palpitations   Hydrocodone Palpitations     Physical Exam:   Vitals  Blood pressure (!) 117/50, pulse 72, temperature 97.8 F (36.6 C), temperature source Oral, resp. rate 18, height _0  (1.676 m), weight 81.6 kg, SpO2 99 %.  1.  General: axoxo3  2. Psychiatric: euthymic  3. Neurologic: cn2-12 intact, reflexes 2+ symmetric, diffuse with no clonus, motor 5/5 in all 4 ext  4. HEENMT:  Anicteric, pupils 1.50m450mymmetric, direct, consensual, near intact Neck + jvd  5. Respiratory : Bilateral crackles about 1/2  up bilateral lung fields, no wheezing  6. Cardiovascular : rrr s1, s2, + click  7. Gastrointestinal:  Abd: soft, obese, nt, nd, +bs  8. Skin:  Ext: no c/c/e, no rash  9.Musculoskeletal:  Good ROM    Data Review:    CBC Recent Labs  Lab 03/27/19 2001  WBC 12.9*  HGB 7.8*  HCT 27.4*  PLT 357  MCV 98.2  MCH 28.0  MCHC 28.5*  RDW 15.8*  LYMPHSABS 1.8  MONOABS 0.6  EOSABS 0.3  BASOSABS 0.0   ------------------------------------------------------------------------------------------------------------------  Results for orders placed or performed during the hospital encounter of 03/27/19 (from the past 48 hour(s))  SARS Coronavirus 2 ([facility], [facility]) Nasopharyngeal Nasopharyngeal Swab     Status: None   Collection Time: 03/27/19  7:26 PM   Specimen: Nasopharyngeal Swab  Result Value Ref Range   SARS Coronavirus 2 NEGATIVE NEGATIVE    Comment: (NOTE) If result is NEGATIVE SARS-CoV-2 target nucleic acids are NOT DETECTED. The SARS-CoV-2 RNA is generally detectable in upper and lower  respiratory specimens during the acute phase of infection. The lowest  concentration of SARS-CoV-2 viral copies this assay can detect is 250  copies / mL. A negative result does not preclude SARS-CoV-2 infection  and should not be used as the sole basis for treatment or other  patient management decisions.  A negative result may occur with  improper specimen collection / handling, submission of specimen other  than nasopharyngeal swab, presence of viral mutation(s) within the  areas targeted by this assay, and inadequate number of viral copies  (<250 copies /  mL). A negative result must be combined with clinical  observations, patient history, and epidemiological information. If result is POSITIVE SARS-CoV-2 target nucleic acids are DETECTED. The SARS-CoV-2 RNA is generally detectable in upper and lower  respiratory specimens dur ing the acute phase  of infection.  Positive  results are indicative of active infection with SARS-CoV-2.  Clinical  correlation with patient history and other diagnostic information is  necessary to determine patient infection status.  Positive results do  not rule out bacterial infection or co-infection with other viruses. If result is PRESUMPTIVE POSTIVE SARS-CoV-2 nucleic acids MAY BE PRESENT.   A presumptive positive result was obtained on the submitted specimen  and confirmed on repeat testing.  While 2019 novel coronavirus  (SARS-CoV-2) nucleic acids may be present in the submitted sample  additional confirmatory testing may be necessary for epidemiological  and / or clinical management purposes  to differentiate between  SARS-CoV-2 and other Sarbecovirus currently known to infect humans.  If clinically indicated additional testing with an alternate test  methodology [phone]) is advised. The SARS-CoV-2 RNA is generally  detectable in upper and lower respiratory sp ecimens during the acute  phase of infection. The expected result is Negative. Fact Sheet for Patients:  StrictlyIdeas.no Fact Sheet for Healthcare Providers: BankingDealers.co.za This test is not yet approved or cleared by the [place] FDA and has been authorized for detection and/or diagnosis of SARS-CoV-2 by FDA under an Emergency Use Authorization (EUA).  This EUA will remain in effect (meaning this test can be used) for the duration of the COVID-19 declaration under Section 564(b)(1) of the Act, 21 U.S.C. section 360bbb-3(b)(1), unless the authorization is terminated or revoked sooner. Performed at [facility], [address] [place], [place] [address]   Basic metabolic panel     Status: Abnormal   Collection Time: 03/27/19  8:01 PM  Result Value Ref Range   Sodium 136 135 - 145 mmol/L   Potassium 3.7 3.5 - 5.1 mmol/L   Chloride 103 98 - 111 mmol/L   CO2 21 (L) 22 - 32 mmol/L     Glucose, Bld 176 (H) 70 - 99 mg/dL   BUN 8 6 - 20 mg/dL   Creatinine, Ser 0.87 0.44 - 1.00 mg/dL   Calcium 8.9 8.9 - 10.3 mg/dL   GFR calc non Af Amer >60 >60 mL/min   GFR calc Af Amer >60 >60 mL/min   Anion gap 12 5 - 15    Comment: Performed at [facility], [address], [place],  [address]  CBC with Differential     Status: Abnormal   Collection Time: 03/27/19  8:01 PM  Result Value Ref Range   WBC 12.9 (H) 4.0 - 10.5 K/uL   RBC 2.79 (L) 3.87 - 5.11 MIL/uL   Hemoglobin 7.8 (L) 12.0 - 15.0 g/dL   HCT 27.4 (L) 36.0 - 46.0 %   MCV 98.2 80.0 - 100.0 fL   MCH 28.0 26.0 - 34.0 pg   MCHC 28.5 (L) 30.0 - 36.0 g/dL   RDW 15.8 (H) 11.5 - 15.5 %   Platelets 357 150 - 400 K/uL   nRBC 0.0 0.0 - 0.2 %   Neutrophils Relative % 78 %   Neutro Abs 10.0 (H) 1.7 - 7.7 K/uL   Lymphocytes Relative 14 %   Lymphs Abs 1.8 0.7 - 4.0 K/uL   Monocytes Relative 5 %   Monocytes Absolute 0.6 0.1 - 1.0 K/uL   Eosinophils Relative 2 %  Eosinophils Absolute 0.3 0.0 - 0.5 K/uL   Basophils Relative 0 %   Basophils Absolute 0.0 0.0 - 0.1 K/uL   Immature Granulocytes 1 %   Abs Immature Granulocytes 0.11 (H) 0.00 - 0.07 K/uL    Comment: Performed at [facility], [address] [place], [place] [address]  Lactic acid, plasma     Status: None   Collection Time: 03/27/19  8:02 PM  Result Value Ref Range   Lactic Acid, Venous 1.3 0.5 - 1.9 mmol/L    Comment: Performed at [facility], [address], [place], [place] [address]  Culture, blood (routine x 2)     Status: None (Preliminary result)   Collection Time: 03/27/19  8:02 PM   Specimen: Left Antecubital; Blood  Result Value Ref Range   Specimen Description LEFT ANTECUBITAL    Special Requests      BOTTLES DRAWN AEROBIC AND ANAEROBIC Blood Culture adequate volume Performed at [facility], [address] [place], [place] [address]    Culture PENDING    Report Status PENDING   Culture, blood (routine x 2)     Status: None (Preliminary  result)   Collection Time: 03/27/19  8:10 PM   Specimen: BLOOD RIGHT HAND  Result Value Ref Range   Specimen Description BLOOD RIGHT HAND    Special Requests      BOTTLES DRAWN AEROBIC AND ANAEROBIC Blood Culture adequate volume Performed at [facility], [address] [place], [place] [address]    Culture PENDING    Report Status PENDING   Lactic acid, plasma     Status: None   Collection Time: 03/27/19  9:34 PM  Result Value Ref Range   Lactic Acid, Venous 1.0 0.5 - 1.9 mmol/L    Comment: Performed at [facility], [address] [place], [place] [address]  Urinalysis, Routine w reflex microscopic     Status: Abnormal   Collection Time: 03/27/19 10:09 PM  Result Value Ref Range   Color, Urine YELLOW YELLOW   APPearance CLEAR CLEAR   Specific Gravity, Urine 1.005 1.005 - 1.030   pH 6.0 5.0 - 8.0   Glucose, UA NEGATIVE NEGATIVE mg/dL   Hgb urine dipstick NEGATIVE NEGATIVE   Bilirubin Urine NEGATIVE NEGATIVE   Ketones, ur NEGATIVE NEGATIVE mg/dL   Protein, ur NEGATIVE NEGATIVE mg/dL   Nitrite NEGATIVE NEGATIVE   Leukocytes,Ua SMALL (A) NEGATIVE   RBC / HPF 0-5 0 - 5 RBC/hpf   WBC, UA 21-50 0 - 5 WBC/hpf   Bacteria, UA RARE (A) NONE SEEN   Squamous Epithelial / LPF 0-5 0 - 5    Comment: Performed at [facility], [address] [place], [place] [address]    Chemistries  Recent Labs  Lab 03/27/19 2001  NA 136  K 3.7  CL 103  CO2 21*  GLUCOSE 176*  BUN 8  CREATININE 0.87  CALCIUM 8.9   ------------------------------------------------------------------------------------------------------------------  ------------------------------------------------------------------------------------------------------------------ GFR: Estimated Creatinine Clearance: 81.4 mL/min (by C-G formula based on SCr of 0.87 mg/dL). Liver Function Tests: No results for input(s): AST, ALT, ALKPHOS, BILITOT, PROT, ALBUMIN in the last 168 hours. No results for input(s): LIPASE, AMYLASE in the  last 168 hours. No results for input(s): AMMONIA in the last 168 hours. Coagulation Profile: No results for input(s): INR, PROTIME in the last 168 hours. Cardiac Enzymes: No results for input(s): CKTOTAL, CKMB, CKMBINDEX, TROPONINI in the last 168 hours. BNP (last 3 results) No results for input(s): PROBNP in the last 8760 hours. HbA1C:  No results for input(s): HGBA1C in the last 72 hours. CBG: No results for input(s): GLUCAP in the last 168 hours. Lipid Profile: No results for input(s): CHOL, HDL, LDLCALC, TRIG, CHOLHDL, LDLDIRECT in the last 72 hours. Thyroid Function Tests: No results for input(s): TSH, T4TOTAL, FREET4, T3FREE, THYROIDAB in the last 72 hours. Anemia Panel: No results for input(s): VITAMINB12, FOLATE, FERRITIN, TIBC, IRON, RETICCTPCT in the last 72 hours.  --------------------------------------------------------------------------------------------------------------- Urine analysis:    Component Value Date/Time   COLORURINE YELLOW 03/27/2019 2209   APPEARANCEUR CLEAR 03/27/2019 2209   LABSPEC 1.005 03/27/2019 2209   PHURINE 6.0 03/27/2019 2209   GLUCOSEU NEGATIVE 03/27/2019 2209   HGBUR NEGATIVE 03/27/2019 2209   BILIRUBINUR NEGATIVE 03/27/2019 2209   KETONESUR NEGATIVE 03/27/2019 2209   PROTEINUR NEGATIVE 03/27/2019 2209   UROBILINOGEN 0.2 10/28/2014 2330   NITRITE NEGATIVE 03/27/2019 2209   LEUKOCYTESUR SMALL (A) 03/27/2019 2209      Imaging Results:    Dg Chest 2 View  Result Date: 03/27/2019 CLINICAL DATA:  Shortness of breath. EXAM: CHEST - 2 VIEW COMPARISON:  09/12/2017 FINDINGS: Stable enlarged cardiac silhouette. Interval prosthetic heart valves. Stable mild diffuse peribronchial thickening and accentuation of the interstitial markings. No pleural fluid. Epicardial wires and small surgical clips in the right breast. Lower thoracic spine degenerative changes. Cholecystectomy clips. IMPRESSION: 1. Stable cardiomegaly and mild chronic bronchitic  changes. 2. No acute abnormality. Electronically Signed   By: Claudie Revering M.D.   On: 03/27/2019 20:05   ekg pending   Assessment & Plan:    Principal Problem:   Acute respiratory failure with hypoxia (HCC) Active Problems:   Anemia   Acute on chronic diastolic CHF (congestive heart failure) (HCC)  Acute respiratory failure with hypoxia secondary to acute on chronic diastolic CHF  Acute on Chronic diastolic CHF Tele Trop I X4J x2 Check tsh Check cardiac echo Cont Metoprolol 32m po bid Start Lasix 446miv qday Start KCL 20 meq po qday Check cmp in am  Dm2 Stop metformin due to relative contraindication of CHF fsbs ac and qhs, ISS  Hyperlipidemia Cont Crestor 1080mo qhs  Copd Albuterol HFA 2puff q6h prn  Start Anoro 1puff qday  Gerd Cont PPI  Anxiety/Depression Cont Prozac 80-> 45m37m qday Cont Trazodone 100mg34mqhs  Anemia Check cbc in am   DVT Prophylaxis-   Coumadin - SCDs   AM Labs Ordered, also please review Full Orders  Family Communication: Admission, patients condition and plan of care including tests being ordered have been discussed with the patient  who indicate understanding and agree with the plan and Code Status.  Code Status:  FULL CODE per patient, [facility]  [place]: Based on patients clinical presentation and evaluation of above clinical data, I have made determination that patient meets Inpatient criteria at this time.  Pt will require iv lasix, and close monitoring,  Pt has high risk of clnical deterioration,  Pt will require > 2 nites stay.    Time spent in minutes : 70 minutes   JamesJani Gravelon 03/27/2019 at 11:34 PM

## 2019-03-27 NOTE — ED Notes (Signed)
Waiting on [facility] to bring inhaler for respiratory.

## 2019-03-27 NOTE — ED Notes (Signed)
Patient transported to X-ray

## 2019-03-27 NOTE — ED Triage Notes (Signed)
Seen at PCP in [facility] for follow from hospital tay in [place].  Treated for bilateral PNA.  Sats 86% in triage on RA.  Negative for COVID on 03/14/2019.

## 2019-03-27 NOTE — ED Notes (Signed)
Pt ambulated to restroom on room air and desatted to 69% upon reentry to exam room. Pt was put back on 4L [place] and her sats improved to 97%.

## 2019-03-27 NOTE — ED Provider Notes (Addendum)
[facility] EMERGENCY DEPARTMENT Provider Note   CSN: [id number] Arrival date & time: 03/27/19  1547     History   Chief Complaint Chief Complaint  Patient presents with  . Shortness of Breath    HPI Holly Figueroa is a 53 y.o. female.     53 year old female with past medical history of sleep apnea, CHF, diabetes presents with complaint of shortness of breath and not feeling well.  Patient states that she was admitted to the hospital in [place] for 5 days, discharged on September 19.  Patient was treated with Zithromax during her hospitalization, followed up with her PCP on September 19 who started her on an unknown antibiotic which she took twice daily for unknown amount of time.  Patient states that she has oxygen at home because she has sleep apnea and normally wears oxygen at night, does not have daytime oxygen requirement however due to her shortness of breath she has been wearing 4 L nasal cannula at home.  Patient went to her PCP today for follow-up visit, was found to be hypoxic with oxygen in the 80% range and was sent back to the emergency room.  Patient reports nonproductive cough, denies fevers or chills.  No other complaints or concerns.     Past Medical History:  Diagnosis Date  . Acute respiratory failure ([place])   . Anemia 08-2008   Blood transfusion  . Anxiety   . Benzodiazepine dependence ([place])   . Cervical cancer ([place])   . CHF (congestive heart failure) ([place])   . Depression   . Diabetes mellitus without complication ([place])   . Headache(784.0)    migraines  . Hypokalemia   . Legionella pneumonia ([place])   . Leukocytosis, unspecified   . Mitral stenosis   . Panic attacks   . Tobacco abuse     Patient Active Problem List   Diagnosis Date Noted  . Gastroesophageal reflux disease   . Chronic obstructive pulmonary disease ([place])   . Acute on chronic respiratory failure with hypoxia ([place]) 03/27/2019  . Shock ([place])   . Pressure injury of skin 09/02/2017   . Sepsis ([place]) 08/24/2017  . HCAP (healthcare-associated pneumonia) 08/24/2017  . Rectal bleeding   . Syncope 08/19/2017  . Hematemesis 08/19/2017  . Iron deficiency anemia due to chronic blood loss   . Esophageal dysphagia   . Acute on chronic diastolic CHF (congestive heart failure) ([place]) 06/12/2017  . Hematochezia 06/12/2017  . Anemia 06/11/2017  . ARDS (adult respiratory distress syndrome) ([place]) 10/30/2014  . Bilateral pneumonia 10/29/2014  . CAP (community acquired pneumonia) 10/29/2014  . Hypokalemia   . Hypoxia   . UTI (urinary tract infection)   . Tobacco abuse 10/11/2010  . Acute respiratory failure ([place]) 10/11/2010    Past Surgical History:  Procedure Laterality Date  . BIOPSY  06/12/2017   Procedure: BIOPSY;  Surgeon: Daneil Dolin, MD;  Location: [facility] ENDO SUITE;  Service: Gastroenterology;;  esophagus  . cardiac valve repair  2011   stretched mitral valve  . CERVICAL CONIZATION W/BX  2000  . CHOLECYSTECTOMY  06/26/2012   Procedure: LAPAROSCOPIC CHOLECYSTECTOMY WITH INTRAOPERATIVE CHOLANGIOGRAM;  Surgeon: Ralene Ok, MD;  Location: [facility] ORS;  Service: General;  Laterality: N/A;  . COLONOSCOPY WITH PROPOFOL N/A 06/12/2017   Normal colon, normal TI, Grade I internal hemorrhoids  . ESOPHAGOGASTRODUODENOSCOPY (EGD) WITH PROPOFOL N/A 06/12/2017   esophagitis, possibly chemical or pill-induced, s/p dilation. small hiatal hernia, normal duodenal bulb and D 2  . GIVENS CAPSULE  STUDY N/A 07/19/2017   incomplete capsule study as did not reach cecum.   Marland Kitchen LASER ABLATION OF THE CERVIX    . MALONEY DILATION  06/12/2017   Procedure: MALONEY DILATION;  Surgeon: Daneil Dolin, MD;  Location: [facility] ENDO SU[facility];  Service: Gastroenterology;;  . MOLE REMOVAL    . NASAL SINUS SURGERY       OB History   No obstetric history on file.      Home Medications    Prior to Admission medications   Medication Sig Start Date End Date Taking? Authorizing Provider  albuterol (PROVENTIL  HFA;VENTOLIN HFA) 108 (90 Base) MCG/ACT inhaler Inhale 1-2 puffs into the lungs every 6 (six) hours as needed for wheezing or shortness of breath (with Aerochamber).   Yes [provider]  ALPRAZolam Duanne Moron) 1 MG tablet Take 0.5 tablets (0.5 mg total) by mouth 3 (three) times daily. anxiety Patient taking differently: Take 1 mg by mouth 3 (three) times daily. anxiety 11/08/14  Yes Ollis, Brandi L, NP  ferrous sulfate 325 (65 FE) MG tablet Take 1 tablet by mouth daily.   Yes [provider]  FLUoxetine (PROZAC) 40 MG capsule Take 80 mg by mouth daily after breakfast.    Yes [provider]  metFORMIN (GLUCOPHAGE) 500 MG tablet Take 1 tablet (500 mg total) by mouth 2 (two) times daily with a meal. 02/08/17  Yes Pattricia Boss, MD  metoprolol tartrate (LOPRESSOR) 50 MG tablet Take 1 tablet (50 mg total) by mouth 2 (two) times daily. 09/18/17  Yes Domenic Polite, MD  pantoprazole (PROTONIX) 40 MG tablet Take 40 mg by mouth daily after breakfast.    Yes [provider]  rosuvastatin (CRESTOR) 10 MG tablet Take 10 mg by mouth daily after breakfast.  04/15/17  Yes [provider]  traZODone (DESYREL) 50 MG tablet Take 100 mg by mouth at bedtime.    Yes [provider]  warfarin (COUMADIN) 5 MG tablet Take 1 tablet by mouth daily. 12/26/18  Yes [provider]  aspirin EC 81 MG tablet Take 1 tablet (81 mg total) by mouth daily after breakfast. 03/29/19   Barton Dubois, MD  Cholecalciferol (VITAMIN D-3) 125 MCG (5000 UT) TABS Take 5,000 Units by mouth daily after breakfast. 03/29/19   Barton Dubois, MD  fluticasone [facility]) 50 MCG/ACT nasal spray Place 1 spray into both nostrils daily. 03/30/19   Barton Dubois, MD  furosemide (LASIX) 20 MG tablet Take 1 tablet (20 mg total) by mouth daily. 03/30/19   Barton Dubois, MD  loratadine (CLARITIN) 10 MG tablet Take 1 tablet (10 mg total) by mouth daily. 03/30/19   Barton Dubois, MD  potassium chloride SA  (KLOR-CON) 20 MEQ tablet Take 1 tablet (20 mEq total) by mouth daily. 03/30/19   Barton Dubois, MD  umeclidinium-vilanterol [facility] ELLIPTA) 62.5-25 MCG/INH AEPB Inhale 1 puff into the lungs daily. 03/30/19   Barton Dubois, MD  vitamin B-12 1000 MCG tablet Take 1 tablet (1,000 mcg total) by mouth daily. 03/30/19   Barton Dubois, MD    Family History Family History  Problem Relation Age of Onset  . Heart disease Father   . COPD Father   . Heart failure Maternal Grandmother   . Stroke Mother   . Colon cancer Brother 31       deceased   . Colon polyps Neg Hx     Social History Social History   Tobacco Use  . Smoking status: Current Every Day Smoker  Packs/day: 1.00    Years: 23.00    Pack years: 23.00    Types: Cigarettes    Last attempt to quit: 05/06/2017    Years since quitting: 1.9  . Smokeless tobacco: Never Used  Substance Use Topics  . Alcohol use: No    Alcohol/week: 0.0 standard drinks  . Drug use: No     Allergies   Iodinated diagnostic agents, Aspirin, Ibuprofen, Sulfa antibiotics, Zofran [ondansetron hcl], Dilaudid [hydromorphone hcl], and Hydrocodone   Review of Systems Review of Systems  Constitutional: Positive for fatigue. Negative for chills, diaphoresis and fever.  HENT: Negative for congestion.   Respiratory: Positive for cough and shortness of breath.   Cardiovascular: Negative for chest pain.  Gastrointestinal: Negative for abdominal pain, constipation, diarrhea, nausea and vomiting.  Genitourinary: Negative for dysuria and frequency.  Musculoskeletal: Negative for arthralgias and myalgias.  Skin: Negative for rash and wound.  Allergic/Immunologic: Positive for immunocompromised state.  Neurological: Positive for weakness. Negative for dizziness.  Psychiatric/Behavioral: Negative for confusion.  All other systems reviewed and are negative.    Physical Exam Updated Vital Signs BP 134/69 (BP Location: Left Wrist)   Pulse 79   Temp 98.1 F  (36.7 C)   Resp 16   Ht _0  (1.676 m)   Wt 81.6 kg   SpO2 96%   BMI 29.04 kg/m   Physical Exam Vitals signs and nursing note reviewed.  Constitutional:      General: She is not in acute distress.    Appearance: She is well-developed. She is not diaphoretic.  HENT:     Head: Normocephalic and atraumatic.  Cardiovascular:     Rate and Rhythm: Normal rate and regular rhythm.     Pulses: Normal pulses.  Pulmonary:     Effort: Tachypnea present.     Breath sounds: Examination of the left-middle field reveals rhonchi. Examination of the left-lower field reveals rhonchi. Rhonchi present. No decreased breath sounds, wheezing or rales.  Chest:     Chest wall: Tenderness present.  Abdominal:     Palpations: Abdomen is soft.     Tenderness: There is no abdominal tenderness.  Musculoskeletal:     Right lower leg: No edema.     Left lower leg: No edema.  Skin:    General: Skin is warm and dry.     Findings: No rash.  Neurological:     Mental Status: She is alert and oriented to person, place, and time.  Psychiatric:        Behavior: Behavior normal.      ED Treatments / Results  Labs (all labs ordered are listed, but only abnormal results are displayed) Labs Reviewed  BASIC METABOLIC PANEL - Abnormal; Notable for the following components:      Result Value   CO2 21 (*)    Glucose, Bld 176 (*)    All other components within normal limits  CBC WITH DIFFERENTIAL/PLATELET - Abnormal; Notable for the following components:   WBC 12.9 (*)    RBC 2.79 (*)    Hemoglobin 7.8 (*)    HCT 27.4 (*)    MCHC 28.5 (*)    RDW 15.8 (*)    Neutro Abs 10.0 (*)    Abs Immature Granulocytes 0.11 (*)    All other components within normal limits  URINALYSIS, ROUTINE W REFLEX MICROSCOPIC - Abnormal; Notable for the following components:   Leukocytes,Ua SMALL (*)    Bacteria, UA RARE (*)    All other components within  normal limits  COMPREHENSIVE METABOLIC PANEL - Abnormal; Notable for the  following components:   Sodium 134 (*)    Glucose, Bld 361 (*)    Creatinine, Ser 1.21 (*)    Calcium 8.3 (*)    Albumin 3.3 (*)    GFR calc non Af Amer 51 (*)    GFR calc Af Amer 60 (*)    All other components within normal limits  CBC - Abnormal; Notable for the following components:   RBC 2.53 (*)    Hemoglobin 7.1 (*)    HCT 24.9 (*)    MCHC 28.5 (*)    RDW 15.8 (*)    All other components within normal limits  PROTIME-INR - Abnormal; Notable for the following components:   Prothrombin Time 23.2 (*)    INR 2.1 (*)    All other components within normal limits  HEMOGLOBIN A1C - Abnormal; Notable for the following components:   Hgb A1c MFr Bld 6.8 (*)    All other components within normal limits  BRAIN NATRIURETIC PEPTIDE - Abnormal; Notable for the following components:   B Natriuretic Peptide 162.0 (*)    All other components within normal limits  PROTIME-INR - Abnormal; Notable for the following components:   Prothrombin Time 21.6 (*)    INR 1.9 (*)    All other components within normal limits  GLUCOSE, CAPILLARY - Abnormal; Notable for the following components:   Glucose-Capillary 313 (*)    All other components within normal limits  GLUCOSE, CAPILLARY - Abnormal; Notable for the following components:   Glucose-Capillary 211 (*)    All other components within normal limits  GLUCOSE, CAPILLARY - Abnormal; Notable for the following components:   Glucose-Capillary 183 (*)    All other components within normal limits  PROTIME-INR - Abnormal; Notable for the following components:   Prothrombin Time 25.0 (*)    INR 2.3 (*)    All other components within normal limits  BASIC METABOLIC PANEL - Abnormal; Notable for the following components:   Glucose, Bld 153 (*)    All other components within normal limits  GLUCOSE, CAPILLARY - Abnormal; Notable for the following components:   Glucose-Capillary 175 (*)    All other components within normal limits  GLUCOSE, CAPILLARY -  Abnormal; Notable for the following components:   Glucose-Capillary 147 (*)    All other components within normal limits  GLUCOSE, CAPILLARY - Abnormal; Notable for the following components:   Glucose-Capillary 174 (*)    All other components within normal limits  CBG MONITORING, ED - Abnormal; Notable for the following components:   Glucose-Capillary 296 (*)    All other components within normal limits  CULTURE, BLOOD (ROUTINE X 2)  CULTURE, BLOOD (ROUTINE X 2)  SARS CORONAVIRUS 2 (HOSPITAL ORDER, [place] [facility])  LACTIC ACID, PLASMA  LACTIC ACID, PLASMA  HIV ANTIBODY (ROUTINE TESTING W REFLEX)  TSH  HIV4GL SAVE TUBE  TROPONIN I (HIGH SENSITIVITY)  TROPONIN I (HIGH SENSITIVITY)    EKG EKG Interpretation  Date/Time:  Friday March 27 2019 16:42:45 EDT Ventricular Rate:  73 PR Interval:  140 QRS Duration: 86 QT Interval:  424 QTC Calculation: 467 R Axis:   -7 Text Interpretation:  Normal sinus rhythm Inferior infarct , age undetermined Anterior infarct , age undetermined Confirmed by Randal Buba, April ([id number]) on 03/28/2019 11:44:31 AM   Radiology No results found.  Procedures .Critical Care Performed by: Tacy Learn, PA-C Authorized by: Tacy Learn, PA-C  Critical care provider statement:    Critical care time (minutes):  45   Critical care was time spent personally by me on the following activities:  Discussions with consultants, evaluation of patient's response to treatment, examination of patient, ordering and performing treatments and interventions, ordering and review of laboratory studies, ordering and review of radiographic studies, pulse oximetry, re-evaluation of patient's condition, obtaining history from patient or surrogate and review of old charts   (including critical care time)  Medications Ordered in ED Medications  methylPREDNISolone sodium succinate (SOLU-MEDROL) 125 mg/2 mL injection 125 mg (125 mg Intravenous Given  03/27/19 2039)  albuterol (PROVENTIL) (2.5 MG/3ML) 0.083% nebulizer solution (2.5 mg  Given 03/27/19 2151)  furosemide (LASIX) injection 40 mg (40 mg Intravenous Given 03/28/19 0107)  potassium chloride SA (KLOR-CON) CR tablet 20 mEq (20 mEq Oral Given 03/28/19 0106)  warfarin (COUMADIN) tablet 5 mg (5 mg Oral Given 03/28/19 0124)  warfarin (COUMADIN) tablet 7.5 mg (7.5 mg Oral Given 03/28/19 1657)     Initial Impression / Assessment and Plan / ED Course  I have reviewed the triage vital signs and the nursing notes.  Pertinent labs & imaging results that were available during my care of the patient were reviewed by me and considered in my medical decision making (see chart for details).  Clinical Course as of Apr 06 1227  Fri Mar 27, 2019  2128 53yo female presents with complaint of SHOB, generalized weakness, dry cough. Patient reports admission to the hospital in [place], [place] in September for bilateral pneumonia, treated with zithromax for 5 days and discharged. Patient states at the time of discharge she was still [facility] and did not feel well enough to go home. Patient was seen by her PCP on 03/14/2019 and started on unknown antibiotic (starts with a C, possibly cefdinir) which she took for unknown number of days. Patient followed up today with her PCP, still not feeling better and was found to be hypoxic and sent to the ER.    [LM]  2133 On arrival in the ER, patient's room air sat was found to be 87%, she is placed on a nasal cannula and improved to 99%.  On exam patient was mildly tachypneic, coarse lung sounds on the left, no leg swelling, abdomen soft and nontender. Chest x-ray shows chronic bronchitic changes. Review of lab work, lactic acid x1 is normal at 1.3.  BMP without significant findings, CBC with elevated white count at 12.9.  Patient is found to have a hemoglobin of 7.8, prior hemoglobins ranged from 9-10 on record.  I do not have access to patient's most recent hospitalization record  for comparison.  Patient's COVID swab is negative. Case discussed with Dr. Sedonia Small, ER attending who has seen the patient.  I have considered PE as possible cause for patient's shortness of breath and new oxygen requirement however due to anaphylaxis from contrast diagnostic agents, unable to obtain CTA chest to rule out PE at this time.  Hospitalist was paged for consult.   [LM]  2135 Patient believes her last hgb at the hospital in [place] was 10, denies heavy menstrual cycles or dark/tarry/bloody stools.    [LM]  2145 Case discussed with Dr. Maudie Mercury, hospitalist, will consult for admission.    [LM]    Clinical Course User Index [LM] Tacy Learn, PA-C      Final Clinical Impressions(s) / ED Diagnoses   Final diagnoses:  Hypoxia  Shortness of breath  Anemia, unspecified type    ED  Discharge Orders         Ordered    furosemide (LASIX) 20 MG tablet  Daily     03/29/19 1341    vitamin B-12 1000 MCG tablet  Daily     03/29/19 1341    potassium chloride SA (KLOR-CON) 20 MEQ tablet  Daily     03/29/19 1341    fluticasone (FLONASE) 50 MCG/ACT nasal spray  Daily     03/29/19 1341    loratadine (CLARITIN) 10 MG tablet  Daily     03/29/19 1341    umeclidinium-vilanterol (ANORO ELLIPTA) 62.5-25 MCG/INH AEPB  Daily     03/29/19 1341    aspirin EC 81 MG tablet  Daily after breakfast     03/29/19 1341    Cholecalciferol (VITAMIN D-3) 125 MCG (5000 UT) TABS  Daily after breakfast     03/29/19 1341    Diet - low sodium heart healthy     03/29/19 1341    Discharge instructions    Comments: Take medications as prescribed Follow heart healthy/low-sodium diet (less than 2.5 g daily). Maintain adequate hydration Use inhaler regimen as instructed and continue to use oxygen supplementation during the day with activities as needed. Follow-up with PCP in 10 days.   03/29/19 1341    (HEART FAILURE PATIENTS) Call MD:  Anytime you have any of the following symptoms: 1) 3 pound weight gain in 24  hours or 5 pounds in 1 week 2) shortness of breath, with or without a dry hacking cough 3) swelling in the hands, feet or stomach 4) if you have to sleep on extra pillows at night in order to breathe.     03/29/19 1341           Tacy Learn, PA-C 03/27/19 2145    Maudie Flakes, MD 03/29/19 1651    Tacy Learn, PA-C 04/06/19 1228    Maudie Flakes, MD 04/07/19 2048

## 2019-03-28 ENCOUNTER — Inpatient Hospital Stay (HOSPITAL_COMMUNITY): Payer: Medicare PPO

## 2019-03-28 DIAGNOSIS — R0902 Hypoxemia: Secondary | ICD-10-CM

## 2019-03-28 DIAGNOSIS — E119 Type 2 diabetes mellitus without complications: Secondary | ICD-10-CM

## 2019-03-28 DIAGNOSIS — J449 Chronic obstructive pulmonary disease, unspecified: Secondary | ICD-10-CM

## 2019-03-28 DIAGNOSIS — I361 Nonrheumatic tricuspid (valve) insufficiency: Secondary | ICD-10-CM

## 2019-03-28 DIAGNOSIS — J309 Allergic rhinitis, unspecified: Secondary | ICD-10-CM

## 2019-03-28 DIAGNOSIS — K219 Gastro-esophageal reflux disease without esophagitis: Secondary | ICD-10-CM

## 2019-03-28 LAB — COMPREHENSIVE METABOLIC PANEL
ALT: 23 U/L (ref 0–44)
AST: 22 U/L (ref 15–41)
Albumin: 3.3 g/dL — ABNORMAL LOW (ref 3.5–5.0)
Alkaline Phosphatase: 117 U/L (ref 38–126)
Anion gap: 7 (ref 5–15)
BUN: 10 mg/dL (ref 6–20)
CO2: 22 mmol/L (ref 22–32)
Calcium: 8.3 mg/dL — ABNORMAL LOW (ref 8.9–10.3)
Chloride: 105 mmol/L (ref 98–111)
Creatinine, Ser: 1.21 mg/dL — ABNORMAL HIGH (ref 0.44–1.00)
GFR calc Af Amer: 60 mL/min — ABNORMAL LOW (ref >60)
GFR calc non Af Amer: 51 mL/min — ABNORMAL LOW (ref >60)
Glucose, Bld: 361 mg/dL — ABNORMAL HIGH (ref 70–99)
Potassium: 4.1 mmol/L (ref 3.5–5.1)
Sodium: 134 mmol/L — ABNORMAL LOW (ref 135–145)
Total Bilirubin: 0.7 mg/dL (ref 0.3–1.2)
Total Protein: 7.3 g/dL (ref 6.5–8.1)

## 2019-03-28 LAB — TSH: TSH: 0.415 u[IU]/mL (ref 0.350–4.500)

## 2019-03-28 LAB — PROTIME-INR
INR: 1.9 — ABNORMAL HIGH (ref 0.8–1.2)
INR: 2.1 — ABNORMAL HIGH (ref 0.8–1.2)
Prothrombin Time: 21.6 seconds — ABNORMAL HIGH (ref 11.4–15.2)
Prothrombin Time: 23.2 seconds — ABNORMAL HIGH (ref 11.4–15.2)

## 2019-03-28 LAB — BRAIN NATRIURETIC PEPTIDE: B Natriuretic Peptide: 162 pg/mL — ABNORMAL HIGH (ref 0.0–100.0)

## 2019-03-28 LAB — GLUCOSE, CAPILLARY
Glucose-Capillary: 313 mg/dL — ABNORMAL HIGH (ref 70–99)
Glucose-Capillary: 211 mg/dL — ABNORMAL HIGH (ref 70–99)
Glucose-Capillary: 183 mg/dL — ABNORMAL HIGH (ref 70–99)
Glucose-Capillary: 175 mg/dL — ABNORMAL HIGH (ref 70–99)

## 2019-03-28 LAB — HEMOGLOBIN A1C
Hgb A1c MFr Bld: 6.8 % — ABNORMAL HIGH (ref 4.8–5.6)
Mean Plasma Glucose: 148.46 mg/dL

## 2019-03-28 LAB — CBC
HCT: 24.9 % — ABNORMAL LOW (ref 36.0–46.0)
Hemoglobin: 7.1 g/dL — ABNORMAL LOW (ref 12.0–15.0)
MCH: 28.1 pg (ref 26.0–34.0)
MCHC: 28.5 g/dL — ABNORMAL LOW (ref 30.0–36.0)
MCV: 98.4 fL (ref 80.0–100.0)
Platelets: 297 10*3/uL (ref 150–400)
RBC: 2.53 MIL/uL — ABNORMAL LOW (ref 3.87–5.11)
RDW: 15.8 % — ABNORMAL HIGH (ref 11.5–15.5)
WBC: 10.5 10*3/uL (ref 4.0–10.5)
nRBC: 0 % (ref 0.0–0.2)

## 2019-03-28 LAB — TROPONIN I (HIGH SENSITIVITY)
Troponin I (High Sensitivity): 3 ng/L (ref <18)
Troponin I (High Sensitivity): 4 ng/L (ref <18)

## 2019-03-28 LAB — CBG MONITORING, ED: Glucose-Capillary: 296 mg/dL — ABNORMAL HIGH (ref 70–99)

## 2019-03-28 LAB — ECHOCARDIOGRAM COMPLETE
Height: 66 in
Weight: 2880 oz

## 2019-03-28 LAB — HIV ANTIBODY (ROUTINE TESTING W REFLEX): HIV Screen 4th Generation wRfx: NONREACTIVE

## 2019-03-28 MED ORDER — ALBUTEROL SULFATE (2.5 MG/3ML) 0.083% IN NEBU
2.5000 mg | INHALATION_SOLUTION | Freq: Four times a day (QID) | RESPIRATORY_TRACT | Status: DC | PRN
  Administered 2019-03-28: 2.5 mg via RESPIRATORY_TRACT
  Filled 2019-03-28: qty 3

## 2019-03-28 MED ORDER — FUROSEMIDE 20 MG PO TABS
20.0000 mg | ORAL_TABLET | Freq: Every day | ORAL | Status: DC
  Administered 2019-03-29: 20 mg via ORAL
  Filled 2019-03-28: qty 1

## 2019-03-28 MED ORDER — ALBUTEROL SULFATE HFA 108 (90 BASE) MCG/ACT IN AERS
1.0000 | INHALATION_SPRAY | Freq: Four times a day (QID) | RESPIRATORY_TRACT | Status: DC | PRN
  Filled 2019-03-28: qty 6.7

## 2019-03-28 MED ORDER — POTASSIUM CHLORIDE CRYS ER 20 MEQ PO TBCR
20.0000 meq | EXTENDED_RELEASE_TABLET | Freq: Every day | ORAL | Status: DC
  Administered 2019-03-28 – 2019-03-29 (×2): 20 meq via ORAL
  Filled 2019-03-28 (×2): qty 1

## 2019-03-28 MED ORDER — ALPRAZOLAM 0.5 MG PO TABS
0.5000 mg | ORAL_TABLET | Freq: Three times a day (TID) | ORAL | Status: DC
  Administered 2019-03-28 – 2019-03-29 (×4): 0.5 mg via ORAL
  Filled 2019-03-28 (×4): qty 1

## 2019-03-28 MED ORDER — FLUTICASONE PROPIONATE 50 MCG/ACT NA SUSP: 1.0000 | Freq: Every day | NASAL | Status: DC

## 2019-03-28 MED ORDER — METOPROLOL TARTRATE 50 MG PO TABS
50.0000 mg | ORAL_TABLET | Freq: Two times a day (BID) | ORAL | Status: DC
  Administered 2019-03-29: 11:00:00 50 mg via ORAL
  Filled 2019-03-28 (×2): qty 1

## 2019-03-28 MED ORDER — WARFARIN SODIUM 5 MG PO TABS
5.0000 mg | ORAL_TABLET | Freq: Once | ORAL | Status: AC
  Administered 2019-03-28: 01:00:00 5 mg via ORAL
  Filled 2019-03-28: qty 1

## 2019-03-28 MED ORDER — FUROSEMIDE 10 MG/ML IJ SOLN
40.0000 mg | Freq: Every day | INTRAMUSCULAR | Status: DC
  Administered 2019-03-28: 40 mg via INTRAVENOUS
  Filled 2019-03-28: qty 4

## 2019-03-28 MED ORDER — ROSUVASTATIN CALCIUM 10 MG PO TABS
10.0000 mg | ORAL_TABLET | Freq: Every day | ORAL | Status: DC
  Administered 2019-03-28 – 2019-03-29 (×2): 10 mg via ORAL
  Filled 2019-03-28 (×2): qty 1

## 2019-03-28 MED ORDER — VITAMIN D 25 MCG (1000 UNIT) PO TABS
5000.0000 [IU] | ORAL_TABLET | Freq: Every day | ORAL | Status: DC
  Filled 2019-03-28 (×2): qty 5

## 2019-03-28 MED ORDER — WARFARIN SODIUM 7.5 MG PO TABS
7.5000 mg | ORAL_TABLET | Freq: Once | ORAL | Status: AC
  Administered 2019-03-28: 7.5 mg via ORAL
  Filled 2019-03-28: qty 1

## 2019-03-28 MED ORDER — FUROSEMIDE 10 MG/ML IJ SOLN
40.0000 mg | Freq: Once | INTRAMUSCULAR | Status: AC
  Administered 2019-03-28: 40 mg via INTRAVENOUS
  Filled 2019-03-28: qty 4

## 2019-03-28 MED ORDER — VITAMIN C 500 MG PO TABS
500.0000 mg | ORAL_TABLET | Freq: Every day | ORAL | Status: DC
  Filled 2019-03-28 (×2): qty 1

## 2019-03-28 MED ORDER — RESOURCE THICKENUP CLEAR PO POWD
ORAL | Status: DC | PRN
  Filled 2019-03-28: qty 125

## 2019-03-28 MED ORDER — UMECLIDINIUM-VILANTEROL 62.5-25 MCG/INH IN AEPB
1.0000 | INHALATION_SPRAY | Freq: Every day | RESPIRATORY_TRACT | Status: DC
  Administered 2019-03-28 – 2019-03-29 (×2): 1 via RESPIRATORY_TRACT
  Filled 2019-03-28: qty 14

## 2019-03-28 MED ORDER — VITAMIN B-12 1000 MCG PO TABS
1000.0000 ug | ORAL_TABLET | Freq: Every day | ORAL | Status: DC
  Filled 2019-03-28: qty 1

## 2019-03-28 MED ORDER — ASPIRIN EC 81 MG PO TBEC
81.0000 mg | DELAYED_RELEASE_TABLET | Freq: Every day | ORAL | Status: DC
  Administered 2019-03-29: 81 mg via ORAL
  Filled 2019-03-28 (×2): qty 1

## 2019-03-28 MED ORDER — WARFARIN - PHARMACIST DOSING INPATIENT
Freq: Every day | Status: DC
  Administered 2019-03-28: 17:00:00

## 2019-03-28 MED ORDER — FLUOXETINE HCL 20 MG PO CAPS
40.0000 mg | ORAL_CAPSULE | Freq: Every day | ORAL | Status: DC
  Administered 2019-03-28 – 2019-03-29 (×2): 40 mg via ORAL
  Filled 2019-03-28 (×2): qty 2

## 2019-03-28 MED ORDER — PANTOPRAZOLE SODIUM 40 MG PO TBEC
40.0000 mg | DELAYED_RELEASE_TABLET | Freq: Every day | ORAL | Status: DC
  Administered 2019-03-28 – 2019-03-29 (×2): 40 mg via ORAL
  Filled 2019-03-28 (×2): qty 1

## 2019-03-28 MED ORDER — LORATADINE 10 MG PO TABS
10.0000 mg | ORAL_TABLET | Freq: Every day | ORAL | Status: DC
  Administered 2019-03-29: 10 mg via ORAL
  Filled 2019-03-28: qty 1

## 2019-03-28 MED ORDER — PROMETHAZINE HCL 12.5 MG PO TABS: 12.5000 mg | ORAL_TABLET | Freq: Four times a day (QID) | ORAL | Status: DC | PRN

## 2019-03-28 MED ORDER — ACETAMINOPHEN 325 MG PO TABS
650.0000 mg | ORAL_TABLET | Freq: Four times a day (QID) | ORAL | Status: DC | PRN
  Administered 2019-03-28: 650 mg via ORAL
  Filled 2019-03-28: qty 2

## 2019-03-28 MED ORDER — POTASSIUM CHLORIDE CRYS ER 20 MEQ PO TBCR
20.0000 meq | EXTENDED_RELEASE_TABLET | Freq: Once | ORAL | Status: AC
  Administered 2019-03-28: 20 meq via ORAL
  Filled 2019-03-28: qty 1

## 2019-03-28 MED ORDER — TRAZODONE HCL 50 MG PO TABS
100.0000 mg | ORAL_TABLET | Freq: Every day | ORAL | Status: DC
  Administered 2019-03-28: 100 mg via ORAL
  Filled 2019-03-28: qty 2

## 2019-03-28 NOTE — Progress Notes (Signed)
*  PRELIMINARY RESULTS* Echocardiogram 2D Echocardiogram has been performed.  Leavy Cella 03/28/2019, 9:37 AM

## 2019-03-28 NOTE — ED Notes (Signed)
Patient to go to floor after 07:30 am

## 2019-03-28 NOTE — ED Notes (Signed)
Patient given meal tray.

## 2019-03-28 NOTE — Progress Notes (Signed)
ANTICOAGULATION CONSULT NOTE - Initial Consult  Pharmacy Consult for warfarin Indication: Mechanical valve- AVR/MVR  Allergies  Allergen Reactions  . Iodinated Diagnostic Agents Anaphylaxis  . Aspirin Other (See Comments)    "Possible blood in stool" the 366m. Can take 865m  . Ibuprofen Other (See Comments)    "Possible blood in stool"  . Sulfa Antibiotics Hives  . Zofran [Ondansetron Hcl]     Bad headaches  . Dilaudid [Hydromorphone Hcl] Itching and Palpitations  . Hydrocodone Palpitations    Patient Measurements: Height: 5' 6" (167.6 cm) Weight: 180 lb (81.6 kg) IBW/kg (Calculated) : 59.3 Heparin Dosing Weight:   Vital Signs: BP: 135/66 (10/03 0700) Pulse Rate: 74 (10/03 0700)  Labs: Recent Labs    03/27/19 2001 03/28/19 0001 03/28/19 0239 03/28/19 0240 03/28/19 0823  HGB 7.8*  --   --  7.1*  --   HCT 27.4*  --   --  24.9*  --   PLT 357  --   --  297  --   LABPROT  --  23.2*  --   --  21.6*  INR  --  2.1*  --   --  1.9*  CREATININE 0.87  --   --  1.21*  --   TROPONINIHS  --  4 3  --   --     Estimated Creatinine Clearance: 58.6 mL/min (A) (by C-G formula based on SCr of 1.21 mg/dL (H)).   Medical History: Past Medical History:  Diagnosis Date  . Acute respiratory failure ([place]  . Anemia 08-2008   Blood transfusion  . Anxiety   . Benzodiazepine dependence ([place]  . Cervical cancer ([place]  . CHF (congestive heart failure) ([place]  . Depression   . Diabetes mellitus without complication ([place]  . Headache(784.0)    migraines  . Hypokalemia   . Legionella pneumonia ([facility]  . Leukocytosis, unspecified   . Mitral stenosis   . Panic attacks   . Tobacco abuse     Medications:  Scheduled:  . ALPRAZolam  0.5 mg Oral TID  . aspirin EC  81 mg Oral QPC breakfast  . cholecalciferol  5,000 Units Oral QPC breakfast  . FLUoxetine  40 mg Oral Daily  . furosemide  40 mg Intravenous Daily  . insulin aspart  0-5 Units Subcutaneous QHS  . insulin aspart   0-9 Units Subcutaneous TID WC  . metoprolol tartrate  50 mg Oral BID  . pantoprazole  40 mg Oral QPC breakfast  . potassium chloride  20 mEq Oral Daily  . rosuvastatin  10 mg Oral QPC breakfast  . traZODone  100 mg Oral QHS  . umeclidinium-vilanterol  1 puff Inhalation Daily  . vitamin B-12  1,000 mcg Oral Daily  . vitamin C  500 mg Oral QPC breakfast  . Warfarin - Pharmacist Dosing Inpatient   Does not apply q1800    Assessment: Pharmacy is consulted to dose warfarin in [age]o female with PMH of h/o mitral stenosis s/p AVR/ MVR, post-op Afib.  Patient takes 5 mg daily.  Current INR 1.9.   Goal of Therapy:  Target INR 2.5-3.5  Monitor platelets by anticoagulation protocol: Yes   Plan:  Warfarin 7.5 mg x 1 dose. Increased dose to boost INR. Monitor daily INR and s/s of bleeding.  Holly AblesPharmD Clinical Pharmacist 03/28/2019 9:55 AM

## 2019-03-28 NOTE — Progress Notes (Signed)
PROGRESS NOTE    Holly Figueroa  HYW:[id number] DOB: [birth year] DOA: 03/27/2019 PCP: Glenda Chroman, MD     Brief Narrative:  53 y.o. female, w anxiety/ depression, gerd,  Iron deficiency anemia, hypertension, hyperlipidemia,  Dm2,  chronic diastolic CHF, h/o mitral stenosis s/p AVR/ MVR, post-op Afib,  Copd, OSA , recent pneumonia presents with 2 weeks of dyspnea, and increase in lower ext edema. Pt is not sure about her weight.  Pt doesn't admit to orthopnea, pnd. Slight cough,  Pt denies fever, chills, cp, palp, n/v, abd pain, diarrhea, brbpr.   Pt presents due to dyspnea.   In ED<  T 97.8, P 77, R 20, Bp 124/52, pox 87% on RA WT 81.6kg  Covid-19 negative  Na 136, K 3.7, Bun 8, Creatinine 0.87 Wbc 12.9, hgb 7.8, Plt 357 Lactic acid 1.3 Blood culture x2   Assessment & Plan: 1-acute on chronic respiratory failure with hypoxia: Patient reports chronically using oxygen at nighttime; since approximately 2-3 weeks ago which she contracted pneumonia she has been experiencing the need of oxygen supplementation during the day as well. -Chest x-ray 8 failed to demonstrate any acute infiltrates; but there were findings suggesting chronic bronchitic changes and mild vascular congestion. -Patient was treated acutely with IV Lasix -Education provided about low-sodium diet -Continue daily weights and strict I's and O's -Will attempt transition of Lasix to oral regimen and monitor diuresis overnight -Check BMP.  2-acute on chronic diastolic heart failure -Continue beta-blocker -Follow echo results -Continue diuresis as mentioned above -Education provided about low-sodium diet and daily weight instructions.  3-gastroesophageal reflux disease -Continue PPI.  4-depression/anxiety -No suicidal ideation or hallucinations currently -Continue Prozac and trazodone.  5-history of COPD -Currently no wheezing -Continue current inhaler regimen  6-allergy rhinitis -Patient will be  started on -Flonase and loratadine.  7-type 2 diabetes mellitus -While inpatient will hold oral hypoglycemic agents daily-continue sliding scale insulin -Modified carbohydrate diet has been encouraged. --check A1C   DVT prophylaxis: Chronically on Coumadin. Code Status: Full code Family Communication: No family at bedside. Disposition Plan: Slowly improving; continue to follow response to diuretics; follow daily weights, urine output and low-sodium diet.  Given ongoing nasal congestion and rhinorrhea on examination will start Flonase and loratadine.  Consultants:   None  Procedures:   See below for x-ray reports.  Echo: Pending  Antimicrobials:  Anti-infectives (From admission, onward)   None      Subjective: Reports feeling short of breath with minimal exertion and still requiring oxygen supplementation.  No chest pain, no nausea, no vomiting.  Objective: Vitals:   03/28/19 0700 03/28/19 0750 03/28/19 1038 03/28/19 1300  BP: 135/66 99/73  (!) 100/44  Pulse: 74 69  73  Resp:  18  18  Temp:  98.1 F (36.7 C)  98.3 F (36.8 C)  TempSrc:  Oral  Oral  SpO2: 94% 100% 98% 100%  Weight:      Height:        Intake/Output Summary (Last 24 hours) at 03/28/2019 1528 Last data filed at 03/28/2019 1200 Gross per 24 hour  Intake 240 ml  Output 1000 ml  Net -760 ml   Filed Weights   03/27/19 1634  Weight: 81.6 kg    Examination:  General exam: Alert, awake, oriented x 3; reports still feeling short of breath requiring oxygen supplementation.  Patient sounds congested on examination. Respiratory system: Fair air movement bilaterally, decreased breath sounds at the bases, no wheezing, positive rhonchi.  Respiratory effort normal.  Cardiovascular system:RRR. No murmurs, rubs, gallops. Gastrointestinal system: Abdomen is nondistended, soft and nontender. No organomegaly or masses felt. Normal bowel sounds heard. Central nervous system: Alert and oriented. No focal  neurological deficits. Extremities: No cyanosis or clubbing. Skin: No rashes, lesions or ulcers Psychiatry: Judgement and insight appear normal. Mood & affect appropriate.     Data Reviewed: I have personally reviewed following labs and imaging studies  CBC: Recent Labs  Lab 03/27/19 2001 03/28/19 0240  WBC 12.9* 10.5  NEUTROABS 10.0*  --   HGB 7.8* 7.1*  HCT 27.4* 24.9*  MCV 98.2 98.4  PLT 357 161   Basic Metabolic Panel: Recent Labs  Lab 03/27/19 2001 03/28/19 0240  NA 136 134*  K 3.7 4.1  CL 103 105  CO2 21* 22  GLUCOSE 176* 361*  BUN 8 10  CREATININE 0.87 1.21*  CALCIUM 8.9 8.3*   GFR: Estimated Creatinine Clearance: 58.6 mL/min (A) (by C-G formula based on SCr of 1.21 mg/dL (H)).   Liver Function Tests: Recent Labs  Lab 03/28/19 0240  AST 22  ALT 23  ALKPHOS 117  BILITOT 0.7  PROT 7.3  ALBUMIN 3.3*   Coagulation Profile: Recent Labs  Lab 03/28/19 0001 03/28/19 0823  INR 2.1* 1.9*   HbA1C: Recent Labs    03/28/19 0248  HGBA1C 6.8*   CBG: Recent Labs  Lab 03/28/19 0113 03/28/19 0809 03/28/19 1130  GLUCAP 296* 313* 211*   Thyroid Function Tests: Recent Labs    03/28/19 0240  TSH 0.415   Urine analysis:    Component Value Date/Time   COLORURINE YELLOW 03/27/2019 2209   APPEARANCEUR CLEAR 03/27/2019 2209   LABSPEC 1.005 03/27/2019 2209   PHURINE 6.0 03/27/2019 2209   GLUCOSEU NEGATIVE 03/27/2019 2209   HGBUR NEGATIVE 03/27/2019 2209   BILIRUBINUR NEGATIVE 03/27/2019 2209   KETONESUR NEGATIVE 03/27/2019 2209   PROTEINUR NEGATIVE 03/27/2019 2209   UROBILINOGEN 0.2 10/28/2014 2330   NITRITE NEGATIVE 03/27/2019 2209   LEUKOCYTESUR SMALL (A) 03/27/2019 2209    Recent Results (from the past 240 hour(s))  SARS Coronavirus 2 [facility] order, Performed in [facility] hospital lab) Nasopharyngeal Nasopharyngeal Swab     Status: None   Collection Time: 03/27/19  7:26 PM   Specimen: Nasopharyngeal Swab  Result Value Ref Range Status    SARS Coronavirus 2 NEGATIVE NEGATIVE Final    Comment: (NOTE) If result is NEGATIVE SARS-CoV-2 target nucleic acids are NOT DETECTED. The SARS-CoV-2 RNA is generally detectable in upper and lower  respiratory specimens during the acute phase of infection. The lowest  concentration of SARS-CoV-2 viral copies this assay can detect is 250  copies / mL. A negative result does not preclude SARS-CoV-2 infection  and should not be used as the sole basis for treatment or other  patient management decisions.  A negative result may occur with  improper specimen collection / handling, submission of specimen other  than nasopharyngeal swab, presence of viral mutation(s) within the  areas targeted by this assay, and inadequate number of viral copies  (<250 copies / mL). A negative result must be combined with clinical  observations, patient history, and epidemiological information. If result is POSITIVE SARS-CoV-2 target nucleic acids are DETECTED. The SARS-CoV-2 RNA is generally detectable in upper and lower  respiratory specimens dur ing the acute phase of infection.  Positive  results are indicative of active infection with SARS-CoV-2.  Clinical  correlation with patient history and other diagnostic information is  necessary to determine patient infection  status.  Positive results do  not rule out bacterial infection or co-infection with other viruses. If result is PRESUMPTIVE POSTIVE SARS-CoV-2 nucleic acids MAY BE PRESENT.   A presumptive positive result was obtained on the submitted specimen  and confirmed on repeat testing.  While 2019 novel coronavirus  (SARS-CoV-2) nucleic acids may be present in the submitted sample  additional confirmatory testing may be necessary for epidemiological  and / or clinical management purposes  to differentiate between  SARS-CoV-2 and other Sarbecovirus currently known to infect humans.  If clinically indicated additional testing with an alternate test   methodology [phone]) is advised. The SARS-CoV-2 RNA is generally  detectable in upper and lower respiratory sp ecimens during the acute  phase of infection. The expected result is Negative. Fact Sheet for Patients:  StrictlyIdeas.no Fact Sheet for Healthcare Providers: BankingDealers.co.za This test is not yet approved or cleared by the [place] FDA and has been authorized for detection and/or diagnosis of SARS-CoV-2 by FDA under an Emergency Use Authorization (EUA).  This EUA will remain in effect (meaning this test can be used) for the duration of the COVID-19 declaration under Section 564(b)(1) of the Act, 21 U.S.C. section 360bbb-3(b)(1), unless the authorization is terminated or revoked sooner. Performed at [facility], [address] [place], [place] [address]   Culture, blood (routine x 2)     Status: None (Preliminary result)   Collection Time: 03/27/19  8:02 PM   Specimen: Left Antecubital; Blood  Result Value Ref Range Status   Specimen Description LEFT ANTECUBITAL  Final   Special Requests   Final    BOTTLES DRAWN AEROBIC AND ANAEROBIC Blood Culture adequate volume   Culture   Final    NO GROWTH < 12 HOURS Performed at [facility], [address], [place], [place] [address]    Report Status PENDING  Incomplete  Culture, blood (routine x 2)     Status: None (Preliminary result)   Collection Time: 03/27/19  8:10 PM   Specimen: BLOOD RIGHT HAND  Result Value Ref Range Status   Specimen Description BLOOD RIGHT HAND  Final   Special Requests   Final    BOTTLES DRAWN AEROBIC AND ANAEROBIC Blood Culture adequate volume   Culture   Final    NO GROWTH < 12 HOURS Performed at [facility], [address] [place], [place] [address]    Report Status PENDING  Incomplete     Radiology Studies: Dg Chest 2 View  Result Date: 03/27/2019 CLINICAL DATA:  Shortness of breath. EXAM: CHEST - 2 VIEW COMPARISON:  09/12/2017  FINDINGS: Stable enlarged cardiac silhouette. Interval prosthetic heart valves. Stable mild diffuse peribronchial thickening and accentuation of the interstitial markings. No pleural fluid. Epicardial wires and small surgical clips in the right breast. Lower thoracic spine degenerative changes. Cholecystectomy clips. IMPRESSION: 1. Stable cardiomegaly and mild chronic bronchitic changes. 2. No acute abnormality. Electronically Signed   By: Claudie Revering M.D.   On: 03/27/2019 20:05     Scheduled Meds: . ALPRAZolam  0.5 mg Oral TID  . aspirin EC  81 mg Oral QPC breakfast  . cholecalciferol  5,000 Units Oral QPC breakfast  . FLUoxetine  40 mg Oral Daily  . fluticasone  1 spray Each Nare Daily  . [START ON 03/29/2019] furosemide  20 mg Oral Daily  . insulin aspart  0-5 Units Subcutaneous QHS  . insulin aspart  0-9 Units Subcutaneous TID WC  . loratadine  10 mg Oral Daily  .  metoprolol tartrate  50 mg Oral BID  . pantoprazole  40 mg Oral QPC breakfast  . potassium chloride  20 mEq Oral Daily  . rosuvastatin  10 mg Oral QPC breakfast  . traZODone  100 mg Oral QHS  . umeclidinium-vilanterol  1 puff Inhalation Daily  . vitamin B-12  1,000 mcg Oral Daily  . vitamin C  500 mg Oral QPC breakfast  . warfarin  7.5 mg Oral Once  . Warfarin - Pharmacist Dosing Inpatient   Does not apply q1800   Continuous Infusions:   LOS: 1 day    Time spent: 30 minutes.    Barton Dubois, MD [facility] Pager [phone]   03/28/2019, 3:28 PM

## 2019-03-28 NOTE — Progress Notes (Signed)
ANTICOAGULATION CONSULT NOTE - Initial Consult  Pharmacy Consult for warfarin Indication: Mechanical valve  Allergies  Allergen Reactions  . Iodinated Diagnostic Agents Anaphylaxis  . Aspirin Other (See Comments)    "Possible blood in stool" the 311m. Can take 872m  . Ibuprofen Other (See Comments)    "Possible blood in stool"  . Sulfa Antibiotics Hives  . Zofran [Ondansetron Hcl]     Bad headaches  . Dilaudid [Hydromorphone Hcl] Itching and Palpitations  . Hydrocodone Palpitations    Patient Measurements: Height: _0  (167.6 cm) Weight: 180 lb (81.6 kg) IBW/kg (Calculated) : 59.3 Heparin Dosing Weight:   Vital Signs: BP: 116/72 (10/03 0430) Pulse Rate: 73 (10/03 0430)  Labs: Recent Labs    03/27/19 2001 03/28/19 0001 03/28/19 0239 03/28/19 0240  HGB 7.8*  --   --  7.1*  HCT 27.4*  --   --  24.9*  PLT 357  --   --  297  LABPROT  --  23.2*  --   --   INR  --  2.1*  --   --   CREATININE 0.87  --   --  1.21*  TROPONINIHS  --  4 3  --     Estimated Creatinine Clearance: 58.6 mL/min (A) (by C-G formula based on SCr of 1.21 mg/dL (H)).   Medical History: Past Medical History:  Diagnosis Date  . Acute respiratory failure ([facility]  . Anemia 08-2008   Blood transfusion  . Anxiety   . Benzodiazepine dependence (HCPort [place]  . Cervical cancer (HCH[place]  . CHF (congestive heart failure) (HCH[place]  . Depression   . Diabetes mellitus without complication (HCAnzac Village  . Headache(784.0)    migraines  . Hypokalemia   . Legionella pneumonia (HCFarm[place]  . Leukocytosis, unspecified   . Mitral stenosis   . Panic attacks   . Tobacco abuse     Medications:  Scheduled:  . ALPRAZolam  0.5 mg Oral TID  . aspirin EC  81 mg Oral QPC breakfast  . FLUoxetine  40 mg Oral Daily  . furosemide  40 mg Intravenous Daily  . insulin aspart  0-5 Units Subcutaneous QHS  . insulin aspart  0-9 Units Subcutaneous TID WC  . metoprolol tartrate  50 mg Oral BID  . pantoprazole  40 mg Oral QPC breakfast   . potassium chloride  20 mEq Oral Daily  . rosuvastatin  10 mg Oral QPC breakfast  . traZODone  100 mg Oral QHS  . umeclidinium-vilanterol  1 puff Inhalation Daily  . vitamin B-12  1,000 mcg Oral Daily  . vitamin C  500 mg Oral QPC breakfast  . Vitamin D-3  5,000 Units Oral QPC breakfast  . Warfarin - Pharmacist Dosing Inpatient   Does not apply q1800    Assessment: Pharmacy is consulted to dose warfarin in [age] female with PMH of h/o mitral stenosis s/p AVR/ MVR, post-op Afib.  Current med rec is not complete. Per RN, pt takes warfarin 5 mg PO daily, but this dose will need to be confirmed once med rec is done. Pt states she did not take dose on 10/2.   Baseline INR is 2.1 on 10/3  Goal of Therapy:  Target INR 2.5-3.5  Monitor platelets by anticoagulation protocol: Yes   Plan:   Warfarin 5 mg PO x 1   Daily PT/INR ordered.  Monitor for signs and symptoms of bleeding     NiRoyetta AsalPharmD, BCPS 03/28/2019 5:21 AM

## 2019-03-29 DIAGNOSIS — J449 Chronic obstructive pulmonary disease, unspecified: Secondary | ICD-10-CM

## 2019-03-29 DIAGNOSIS — K219 Gastro-esophageal reflux disease without esophagitis: Secondary | ICD-10-CM

## 2019-03-29 DIAGNOSIS — J9621 Acute and chronic respiratory failure with hypoxia: Secondary | ICD-10-CM

## 2019-03-29 LAB — GLUCOSE, CAPILLARY
Glucose-Capillary: 147 mg/dL — ABNORMAL HIGH (ref 70–99)
Glucose-Capillary: 174 mg/dL — ABNORMAL HIGH (ref 70–99)

## 2019-03-29 LAB — PROTIME-INR
INR: 2.3 — ABNORMAL HIGH (ref 0.8–1.2)
Prothrombin Time: 25 seconds — ABNORMAL HIGH (ref 11.4–15.2)

## 2019-03-29 LAB — BASIC METABOLIC PANEL
Anion gap: 11 (ref 5–15)
BUN: 20 mg/dL (ref 6–20)
CO2: 24 mmol/L (ref 22–32)
Calcium: 9.1 mg/dL (ref 8.9–10.3)
Chloride: 105 mmol/L (ref 98–111)
Creatinine, Ser: 0.88 mg/dL (ref 0.44–1.00)
GFR calc Af Amer: >60 mL/min (ref >60)
GFR calc non Af Amer: >60 mL/min (ref >60)
Glucose, Bld: 153 mg/dL — ABNORMAL HIGH (ref 70–99)
Potassium: 4.2 mmol/L (ref 3.5–5.1)
Sodium: 140 mmol/L (ref 135–145)

## 2019-03-29 MED ORDER — FLUTICASONE PROPIONATE 50 MCG/ACT NA SUSP: 1.0000 | Freq: Every day | NASAL | 1 refills | Status: DC

## 2019-03-29 MED ORDER — ASPIRIN EC 81 MG PO TBEC: 81.0000 mg | DELAYED_RELEASE_TABLET | Freq: Every day | ORAL | Status: DC

## 2019-03-29 MED ORDER — CYANOCOBALAMIN 1000 MCG PO TABS: 1000.0000 ug | ORAL_TABLET | Freq: Every day | ORAL | 1 refills | Status: DC

## 2019-03-29 MED ORDER — VITAMIN D-3 125 MCG (5000 UT) PO TABS: 5000.0000 [IU] | ORAL_TABLET | Freq: Every day | ORAL | 3 refills | Status: DC

## 2019-03-29 MED ORDER — LORATADINE 10 MG PO TABS: 10.0000 mg | ORAL_TABLET | Freq: Every day | ORAL | 10 refills | Status: DC

## 2019-03-29 MED ORDER — POTASSIUM CHLORIDE CRYS ER 20 MEQ PO TBCR: 20.0000 meq | EXTENDED_RELEASE_TABLET | Freq: Every day | ORAL | 1 refills | Status: DC

## 2019-03-29 MED ORDER — FUROSEMIDE 20 MG PO TABS: 20.0000 mg | ORAL_TABLET | Freq: Every day | ORAL | 3 refills | Status: DC

## 2019-03-29 MED ORDER — WARFARIN SODIUM 5 MG PO TABS: 5.0000 mg | ORAL_TABLET | Freq: Once | ORAL | Status: DC

## 2019-03-29 MED ORDER — UMECLIDINIUM-VILANTEROL 62.5-25 MCG/INH IN AEPB: 1.0000 | INHALATION_SPRAY | Freq: Every day | RESPIRATORY_TRACT | 1 refills | Status: DC

## 2019-03-29 NOTE — Progress Notes (Signed)
ANTICOAGULATION CONSULT NOTE - Initial Consult  Pharmacy Consult for warfarin Indication: Mechanical valve- AVR/MVR  Allergies  Allergen Reactions  . Iodinated Diagnostic Agents Anaphylaxis  . Aspirin Other (See Comments)    "Possible blood in stool" the 346m. Can take 891m  . Ibuprofen Other (See Comments)    "Possible blood in stool"  . Sulfa Antibiotics Hives  . Zofran [Ondansetron Hcl]     Bad headaches  . Dilaudid [Hydromorphone Hcl] Itching and Palpitations  . Hydrocodone Palpitations    Patient Measurements: Height: _0  (167.6 cm) Weight: 179 lb 14.3 oz (81.6 kg) IBW/kg (Calculated) : 59.3 Heparin Dosing Weight:   Vital Signs: Temp: 98.1 F (36.7 C) (10/04 0517) BP: 134/69 (10/04 0517) Pulse Rate: 79 (10/04 0517)  Labs: Recent Labs    03/27/19 2001 03/28/19 0001 03/28/19 0239 03/28/19 0240 03/28/19 0823 03/29/19 0625  HGB 7.8*  --   --  7.1*  --   --   HCT 27.4*  --   --  24.9*  --   --   PLT 357  --   --  297  --   --   LABPROT  --  23.2*  --   --  21.6* 25.0*  INR  --  2.1*  --   --  1.9* 2.3*  CREATININE 0.87  --   --  1.21*  --  0.88  TROPONINIHS  --  4 3  --   --   --     Estimated Creatinine Clearance: 80.5 mL/min (by C-G formula based on SCr of 0.88 mg/dL).   Medical History: Past Medical History:  Diagnosis Date  . Acute respiratory failure (HCTwin [place]  . Anemia 08-2008   Blood transfusion  . Anxiety   . Benzodiazepine dependence (HCPlain[place]  . Cervical cancer (HCEast [place]  . CHF (congestive heart failure) (HCSpart[place]  . Depression   . Diabetes mellitus without complication (HCHum[place]  . Headache(784.0)    migraines  . Hypokalemia   . Legionella pneumonia (HCCross[place]  . Leukocytosis, unspecified   . Mitral stenosis   . Panic attacks   . Tobacco abuse     Medications:  Scheduled:  . ALPRAZolam  0.5 mg Oral TID  . aspirin EC  81 mg Oral QPC breakfast  . cholecalciferol  5,000 Units Oral QPC breakfast  . FLUoxetine  40 mg Oral Daily  . fluticasone  1  spray Each Nare Daily  . furosemide  20 mg Oral Daily  . insulin aspart  0-5 Units Subcutaneous QHS  . insulin aspart  0-9 Units Subcutaneous TID WC  . loratadine  10 mg Oral Daily  . metoprolol tartrate  50 mg Oral BID  . pantoprazole  40 mg Oral QPC breakfast  . potassium chloride  20 mEq Oral Daily  . rosuvastatin  10 mg Oral QPC breakfast  . traZODone  100 mg Oral QHS  . umeclidinium-vilanterol  1 puff Inhalation Daily  . vitamin B-12  1,000 mcg Oral Daily  . vitamin C  500 mg Oral QPC breakfast  . Warfarin - Pharmacist Dosing Inpatient   Does not apply q1800    Assessment: Pharmacy is consulted to dose warfarin in [age] female with PMH of h/o mitral stenosis s/p AVR/ MVR, post-op Afib.  Patient takes 5 mg daily.  Current INR 2.3   Goal of Therapy:  Target INR 2.5-3.5  Monitor platelets by anticoagulation protocol: Yes   Plan:  Warfarin 5 mg x 1 dose.  Monitor daily INR and s/s of bleeding.  Margot Ables, PharmD Clinical Pharmacist 03/29/2019 9:47 AM

## 2019-03-29 NOTE — Progress Notes (Signed)
Prescriptions, medication changes, follow up care, and discharge instructions reviewed with pt at this time. Denies any questions or concerns. IV removed, pt belongings (clothing, cell phone, and glasses) are in pt's possession. Denies missing belongings. Spouse to take pt home.

## 2019-03-29 NOTE — Discharge Summary (Signed)
Physician Discharge Summary  Holly Figueroa WYO:[id number] DOB: 1965/12/11 DOA: 03/27/2019  PCP: Glenda Chroman, MD  Admit date: 03/27/2019 Discharge date: 03/29/2019  Time spent: 35 minutes  Recommendations for Outpatient Follow-up:  1. Assess ability to taper off oxygen and once again resumed only nighttime use. 2. Repeat basic metabolic panel to follow electrolytes renal function  Discharge Diagnoses:  Principal Problem:   Acute on chronic respiratory failure with hypoxia (HCC) Active Problems:   Anemia   Acute on chronic diastolic CHF (congestive heart failure) (HCC)   Gastroesophageal reflux disease   Chronic obstructive pulmonary disease ([place])   Discharge Condition: Stable and improved.  Patient discharged home with instruction to follow-up with PCP in 10 days.  Diet recommendation: Heart healthy and modified carbohydrate diet.  Filed Weights   03/27/19 1634 03/29/19 0337  Weight: 81.6 kg 81.6 kg    History of present illness:  As per H&P written by Dr. Jani Gravel on 03-27-19 53 y.o.female,w anxiety/ depression, gerd, Iron deficiency anemia, hypertension, hyperlipidemia, Dm2, chronic diastolic CHF, h/o mitral stenosis s/p AVR/ MVR, post-op Afib, Copd, OSA , recent pneumonia presents with 2 weeks of dyspnea, and increase in lower ext edema. Pt is not sure about her weight. Pt doesn't admit to orthopnea, pnd. Slight cough, Pt denies fever, chills, cp, palp, n/v, abd pain, diarrhea, brbpr. Pt presents due to dyspnea.   In ED< T 97.8, P 77, R 20, Bp 124/52, pox 87% on RA WT 81.6kg  Covid-19 negative  Na 136, K 3.7, Bun 8, Creatinine 0.87 Wbc 12.9, hgb 7.8, Plt 357 Lactic acid 1.3 Blood culture x2  Hospital Course:  1-acute on chronic respiratory failure with hypoxia: Patient reports chronically using oxygen at nighttime; since approximately 2-3 weeks ago which she contracted pneumonia she has been experiencing the need of oxygen supplementation during the  day as well. -Chest x-ray failed to demonstrate any acute infiltrates; but there were findings suggesting chronic bronchitic changes and mild vascular congestion. -Patient was treated acutely with IV Lasix and Anoro Ellipta; significant improvement in overall breathing. -Continue daily weights and low-sodium diet -Will discharge on Lasix 20 mg daily  -Follow-up basic metabolic panel at follow-up visit.  2-acute on chronic diastolic heart failure -Continue beta-blocker -Echo demonstrating preserved ejection fraction and no wall motion normalities.  And stable valve after replacement. -Continue lasix 60m daily -Education provided about low-sodium diet and daily weight instructions. -Advised to maintain adequate hydration.  3-gastroesophageal reflux disease -Continue PPI.  4-depression/anxiety -No suicidal ideation or hallucinations currently -Continue Prozac and trazodone.  5-history of COPD -Currently no wheezing at discharge; positive rhonchi. -Continue PRN albuterol and Anoro Ellipta.  6-allergy rhinitis -Patient started on Flonase and loratadine.  7-type 2 diabetes mellitus -Resume home oral hypoglycemic agents. -Modified carbohydrate diet has been encouraged. -checked A1C 6.8  8-mechanical valve replacement -Continue Coumadin therapy.  Procedures:  See below for x-ray reports  Echo: No wall motion and normalities, preserved ejection fraction (60-65%), mild increased left ventricular hypertrophy; diastolic function could not be evaluatedm  Consultations:  None   Discharge Exam: Vitals:   03/29/19 0517 03/29/19 0756  BP: 134/69   Pulse: 79   Resp: 16   Temp: 98.1 F (36.7 C)   SpO2: 92% 96%   General exam: Alert, awake, oriented x 3; reports feeling much better overall and breathing easier.  Still requiring oxygen supplementation, but down to 2-3 L mainly with activities. Patient congestion symptoms has improved since Flonase and Claritin  initiated. Respiratory system:  Improved air movement bilaterally, no significant wheezing, positive rhonchi bilaterally. Respiratory effort normal. Cardiovascular system:RRR. No murmurs, rubs, gallops. Gastrointestinal system: Abdomen is nondistended, soft and nontender. No organomegaly or masses felt. Normal bowel sounds heard. Central nervous system: Alert and oriented. No focal neurological deficits. Extremities: No cyanosis or clubbing. Skin: No rashes, lesions or ulcers Psychiatry: Judgement and insight appear normal. Mood & affect appropriate.    Discharge Instructions   Discharge Instructions    (HEART FAILURE PATIENTS) Call MD:  Anytime you have any of the following symptoms: 1) 3 pound weight gain in 24 hours or 5 pounds in 1 week 2) shortness of breath, with or without a dry hacking cough 3) swelling in the hands, feet or stomach 4) if you have to sleep on extra pillows at night in order to breathe.   Complete by: As directed    Diet - low sodium heart healthy   Complete by: As directed    Discharge instructions   Complete by: As directed    Take medications as prescribed Follow heart healthy/low-sodium diet (less than 2.5 g daily). Maintain adequate hydration Use inhaler regimen as instructed and continue to use oxygen supplementation during the day with activities as needed. Follow-up with PCP in 10 days.     Allergies as of 03/29/2019      Reactions   Iodinated Diagnostic Agents Anaphylaxis   Aspirin Other (See Comments)   "Possible blood in stool" the 363m. Can take 872m   Ibuprofen Other (See Comments)   "Possible blood in stool"   Sulfa Antibiotics Hives   Zofran [ondansetron Hcl]    Bad headaches   Dilaudid [hydromorphone Hcl] Itching, Palpitations   Hydrocodone Palpitations      Medication List    TAKE these medications   albuterol 108 (90 Base) MCG/ACT inhaler Commonly known as: VENTOLIN HFA Inhale 1-2 puffs into the lungs every 6 (six) hours as  needed for wheezing or shortness of breath (with Aerochamber).   ALPRAZolam 1 MG tablet Commonly known as: XANAX Take 0.5 tablets (0.5 mg total) by mouth 3 (three) times daily. anxiety What changed: how much to take   aspirin EC 81 MG tablet Take 1 tablet (81 mg total) by mouth daily after breakfast.   cyanocobalamin 1000 MCG tablet Take 1 tablet (1,000 mcg total) by mouth daily. Start taking on: March 30, 2019 What changed:   medication strength  how much to take  when to take this   ferrous sulfate 325 (65 FE) MG tablet Take 1 tablet by mouth daily.   FLUoxetine 40 MG capsule Commonly known as: PROZAC Take 80 mg by mouth daily after breakfast.   fluticasone 50 MCG/ACT nasal spray Commonly known as: FLONASE Place 1 spray into both nostrils daily. Start taking on: March 30, 2019   furosemide 20 MG tablet Commonly known as: LASIX Take 1 tablet (20 mg total) by mouth daily. Start taking on: March 30, 2019 What changed:   when to take this  reasons to take this   loratadine 10 MG tablet Commonly known as: CLARITIN Take 1 tablet (10 mg total) by mouth daily. Start taking on: March 30, 2019   metFORMIN 500 MG tablet Commonly known as: GLUCOPHAGE Take 1 tablet (500 mg total) by mouth 2 (two) times daily with a meal.   metoprolol tartrate 50 MG tablet Commonly known as: LOPRESSOR Take 1 tablet (50 mg total) by mouth 2 (two) times daily.   pantoprazole 40 MG tablet Commonly known as: PROTONIX  Take 40 mg by mouth daily after breakfast.   potassium chloride SA 20 MEQ tablet Commonly known as: KLOR-CON Take 1 tablet (20 mEq total) by mouth daily. Start taking on: March 30, 2019   rosuvastatin 10 MG tablet Commonly known as: CRESTOR Take 10 mg by mouth daily after breakfast.   traZODone 50 MG tablet Commonly known as: DESYREL Take 100 mg by mouth at bedtime.   umeclidinium-vilanterol 62.5-25 MCG/INH Aepb Commonly known as: ANORO ELLIPTA Inhale 1  puff into the lungs daily. Start taking on: March 30, 2019   Vitamin D-3 125 MCG (5000 UT) Tabs Take 5,000 Units by mouth daily after breakfast.   warfarin 5 MG tablet Commonly known as: COUMADIN Take 1 tablet by mouth daily.      Allergies  Allergen Reactions  . Iodinated Diagnostic Agents Anaphylaxis  . Aspirin Other (See Comments)    "Possible blood in stool" the 372m. Can take 8106m  . Ibuprofen Other (See Comments)    "Possible blood in stool"  . Sulfa Antibiotics Hives  . Zofran [Ondansetron Hcl]     Bad headaches  . Dilaudid [Hydromorphone Hcl] Itching and Palpitations  . Hydrocodone Palpitations   Follow-up Information    Vyas, Dhruv B, MD. Schedule an appointment as soon as possible for a visit in 10 day(s).   Specialty: Internal Medicine Contact information: [address] [phone]          The results of significant diagnostics from this hospitalization (including imaging, microbiology, ancillary and laboratory) are listed below for reference.    Significant Diagnostic Studies: Dg Chest 2 View  Result Date: 03/27/2019 CLINICAL DATA:  Shortness of breath. EXAM: CHEST - 2 VIEW COMPARISON:  09/12/2017 FINDINGS: Stable enlarged cardiac silhouette. Interval prosthetic heart valves. Stable mild diffuse peribronchial thickening and accentuation of the interstitial markings. No pleural fluid. Epicardial wires and small surgical clips in the right breast. Lower thoracic spine degenerative changes. Cholecystectomy clips. IMPRESSION: 1. Stable cardiomegaly and mild chronic bronchitic changes. 2. No acute abnormality. Electronically Signed   By: StClaudie Revering.D.   On: 03/27/2019 20:05    Microbiology: Recent Results (from the past 240 hour(s))  SARS Coronavirus 2 ([facility]) Nasopharyngeal Nasopharyngeal Swab     Status: None   Collection Time: 03/27/19  7:26 PM   Specimen: Nasopharyngeal Swab   Result Value Ref Range Status   SARS Coronavirus 2 NEGATIVE NEGATIVE Final    Comment: (NOTE) If result is NEGATIVE SARS-CoV-2 target nucleic acids are NOT DETECTED. The SARS-CoV-2 RNA is generally detectable in upper and lower  respiratory specimens during the acute phase of infection. The lowest  concentration of SARS-CoV-2 viral copies this assay can detect is 250  copies / mL. A negative result does not preclude SARS-CoV-2 infection  and should not be used as the sole basis for treatment or other  patient management decisions.  A negative result may occur with  improper specimen collection / handling, submission of specimen other  than nasopharyngeal swab, presence of viral mutation(s) within the  areas targeted by this assay, and inadequate number of viral copies  (<250 copies / mL). A negative result must be combined with clinical  observations, patient history, and epidemiological information. If result is POSITIVE SARS-CoV-2 target nucleic acids are DETECTED. The SARS-CoV-2 RNA is generally detectable in upper and lower  respiratory specimens dur ing the acute phase of infection.  Positive  results are indicative  of active infection with SARS-CoV-2.  Clinical  correlation with patient history and other diagnostic information is  necessary to determine patient infection status.  Positive results do  not rule out bacterial infection or co-infection with other viruses. If result is PRESUMPTIVE POSTIVE SARS-CoV-2 nucleic acids MAY BE PRESENT.   A presumptive positive result was obtained on the submitted specimen  and confirmed on repeat testing.  While 2019 novel coronavirus  (SARS-CoV-2) nucleic acids may be present in the submitted sample  additional confirmatory testing may be necessary for epidemiological  and / or clinical management purposes  to differentiate between  SARS-CoV-2 and other Sarbecovirus currently known to infect humans.  If clinically indicated additional  testing with an alternate test  methodology [phone]) is advised. The SARS-CoV-2 RNA is generally  detectable in upper and lower respiratory sp ecimens during the acute  phase of infection. The expected result is Negative. Fact Sheet for Patients:  StrictlyIdeas.no Fact Sheet for Healthcare Providers: BankingDealers.co.za This test is not yet approved or cleared by the [place] FDA and has been authorized for detection and/or diagnosis of SARS-CoV-2 by FDA under an Emergency Use Authorization (EUA).  This EUA will remain in effect (meaning this test can be used) for the duration of the COVID-19 declaration under Section 564(b)(1) of the Act, 21 U.S.C. section 360bbb-3(b)(1), unless the authorization is terminated or revoked sooner. Performed at [facility], [address] [place], [place] [address]   Culture, blood (routine x 2)     Status: None (Preliminary result)   Collection Time: 03/27/19  8:02 PM   Specimen: Left Antecubital; Blood  Result Value Ref Range Status   Specimen Description LEFT ANTECUBITAL  Final   Special Requests   Final    BOTTLES DRAWN AEROBIC AND ANAEROBIC Blood Culture adequate volume   Culture   Final    NO GROWTH 2 DAYS Performed at [facility], [address], [place], [place] [address]    Report Status PENDING  Incomplete  Culture, blood (routine x 2)     Status: None (Preliminary result)   Collection Time: 03/27/19  8:10 PM   Specimen: BLOOD RIGHT HAND  Result Value Ref Range Status   Specimen Description BLOOD RIGHT HAND  Final   Special Requests   Final    BOTTLES DRAWN AEROBIC AND ANAEROBIC Blood Culture adequate volume   Culture   Final    NO GROWTH 2 DAYS Performed at [facility], [address] [place], [place] [address]    Report Status PENDING  Incomplete     Labs: Basic Metabolic Panel: Recent Labs  Lab 03/27/19 2001 03/28/19 0240 03/29/19 0625  NA 136 134* 140  K 3.7 4.1 4.2   CL 103 105 105  CO2 21* 22 24  GLUCOSE 176* 361* 153*  BUN _0 CREATININE 0.87 1.21* 0.88  CALCIUM 8.9 8.3* 9.1   Liver Function Tests: Recent Labs  Lab 03/28/19 0240  AST 22  ALT 23  ALKPHOS 117  BILITOT 0.7  PROT 7.3  ALBUMIN 3.3*   CBC: Recent Labs  Lab 03/27/19 2001 03/28/19 0240  WBC 12.9* 10.5  NEUTROABS 10.0*  --   HGB 7.8* 7.1*  HCT 27.4* 24.9*  MCV 98.2 98.4  PLT 357 297   BNP (last 3 results) Recent Labs    03/28/19 0248  BNP 162.0*   CBG: Recent Labs  Lab 03/28/19 1130 03/28/19 1652 03/28/19 2033 03/29/19 0719 03/29/19 1106  GLUCAP 211* 183* 175* 147*  174*    Signed:  Barton Dubois MD.  [facility] 03/29/2019, 1:44 PM

## 2019-04-01 DIAGNOSIS — Z87891 Personal history of nicotine dependence: Secondary | ICD-10-CM | POA: Diagnosis not present

## 2019-04-01 DIAGNOSIS — Z954 Presence of other heart-valve replacement: Secondary | ICD-10-CM | POA: Diagnosis not present

## 2019-04-01 DIAGNOSIS — R9431 Abnormal electrocardiogram [ECG] [EKG]: Secondary | ICD-10-CM | POA: Diagnosis not present

## 2019-04-01 DIAGNOSIS — I4589 Other specified conduction disorders: Secondary | ICD-10-CM | POA: Diagnosis not present

## 2019-04-01 DIAGNOSIS — I4891 Unspecified atrial fibrillation: Secondary | ICD-10-CM | POA: Diagnosis not present

## 2019-04-01 DIAGNOSIS — Z952 Presence of prosthetic heart valve: Secondary | ICD-10-CM | POA: Diagnosis not present

## 2019-04-01 DIAGNOSIS — I9789 Other postprocedural complications and disorders of the circulatory system, not elsewhere classified: Secondary | ICD-10-CM | POA: Diagnosis not present

## 2019-04-01 DIAGNOSIS — I5033 Acute on chronic diastolic (congestive) heart failure: Secondary | ICD-10-CM | POA: Diagnosis not present

## 2019-04-01 DIAGNOSIS — I110 Hypertensive heart disease with heart failure: Secondary | ICD-10-CM | POA: Diagnosis not present

## 2019-04-01 DIAGNOSIS — I10 Essential (primary) hypertension: Secondary | ICD-10-CM | POA: Diagnosis not present

## 2019-04-01 DIAGNOSIS — R0602 Shortness of breath: Secondary | ICD-10-CM | POA: Diagnosis not present

## 2019-04-01 LAB — CULTURE, BLOOD (ROUTINE X 2)
Culture: NO GROWTH
Culture: NO GROWTH
Special Requests: ADEQUATE
Special Requests: ADEQUATE

## 2019-04-03 DIAGNOSIS — Z20828 Contact with and (suspected) exposure to other viral communicable diseases: Secondary | ICD-10-CM | POA: Diagnosis not present

## 2019-04-03 DIAGNOSIS — Z952 Presence of prosthetic heart valve: Secondary | ICD-10-CM | POA: Diagnosis not present

## 2019-04-03 DIAGNOSIS — I10 Essential (primary) hypertension: Secondary | ICD-10-CM | POA: Diagnosis not present

## 2019-04-03 DIAGNOSIS — I110 Hypertensive heart disease with heart failure: Secondary | ICD-10-CM | POA: Diagnosis not present

## 2019-04-03 DIAGNOSIS — D508 Other iron deficiency anemias: Secondary | ICD-10-CM | POA: Diagnosis not present

## 2019-04-03 DIAGNOSIS — E785 Hyperlipidemia, unspecified: Secondary | ICD-10-CM | POA: Diagnosis not present

## 2019-04-03 DIAGNOSIS — F410 Panic disorder [episodic paroxysmal anxiety] without agoraphobia: Secondary | ICD-10-CM | POA: Diagnosis not present

## 2019-04-03 DIAGNOSIS — F329 Major depressive disorder, single episode, unspecified: Secondary | ICD-10-CM | POA: Diagnosis not present

## 2019-04-03 DIAGNOSIS — I4891 Unspecified atrial fibrillation: Secondary | ICD-10-CM | POA: Diagnosis not present

## 2019-04-03 DIAGNOSIS — I9789 Other postprocedural complications and disorders of the circulatory system, not elsewhere classified: Secondary | ICD-10-CM | POA: Diagnosis not present

## 2019-04-03 DIAGNOSIS — F419 Anxiety disorder, unspecified: Secondary | ICD-10-CM | POA: Diagnosis not present

## 2019-04-03 DIAGNOSIS — J441 Chronic obstructive pulmonary disease with (acute) exacerbation: Secondary | ICD-10-CM | POA: Diagnosis not present

## 2019-04-03 DIAGNOSIS — E119 Type 2 diabetes mellitus without complications: Secondary | ICD-10-CM | POA: Diagnosis not present

## 2019-04-03 DIAGNOSIS — J439 Emphysema, unspecified: Secondary | ICD-10-CM | POA: Diagnosis not present

## 2019-04-03 DIAGNOSIS — I5033 Acute on chronic diastolic (congestive) heart failure: Secondary | ICD-10-CM | POA: Diagnosis not present

## 2019-04-03 DIAGNOSIS — J9601 Acute respiratory failure with hypoxia: Secondary | ICD-10-CM | POA: Diagnosis not present

## 2019-04-03 DIAGNOSIS — Z954 Presence of other heart-valve replacement: Secondary | ICD-10-CM | POA: Diagnosis not present

## 2019-04-03 DIAGNOSIS — J9621 Acute and chronic respiratory failure with hypoxia: Secondary | ICD-10-CM | POA: Diagnosis not present

## 2019-04-03 DIAGNOSIS — R0600 Dyspnea, unspecified: Secondary | ICD-10-CM | POA: Diagnosis not present

## 2019-04-04 DIAGNOSIS — E785 Hyperlipidemia, unspecified: Secondary | ICD-10-CM | POA: Diagnosis not present

## 2019-04-04 DIAGNOSIS — F410 Panic disorder [episodic paroxysmal anxiety] without agoraphobia: Secondary | ICD-10-CM | POA: Diagnosis not present

## 2019-04-04 DIAGNOSIS — F329 Major depressive disorder, single episode, unspecified: Secondary | ICD-10-CM | POA: Diagnosis not present

## 2019-04-04 DIAGNOSIS — R918 Other nonspecific abnormal finding of lung field: Secondary | ICD-10-CM | POA: Diagnosis not present

## 2019-04-04 DIAGNOSIS — I110 Hypertensive heart disease with heart failure: Secondary | ICD-10-CM | POA: Diagnosis not present

## 2019-04-04 DIAGNOSIS — J9621 Acute and chronic respiratory failure with hypoxia: Secondary | ICD-10-CM | POA: Diagnosis not present

## 2019-04-04 DIAGNOSIS — J439 Emphysema, unspecified: Secondary | ICD-10-CM | POA: Diagnosis not present

## 2019-04-04 DIAGNOSIS — E119 Type 2 diabetes mellitus without complications: Secondary | ICD-10-CM | POA: Diagnosis not present

## 2019-04-04 DIAGNOSIS — I5033 Acute on chronic diastolic (congestive) heart failure: Secondary | ICD-10-CM | POA: Diagnosis not present

## 2019-04-04 DIAGNOSIS — J441 Chronic obstructive pulmonary disease with (acute) exacerbation: Secondary | ICD-10-CM | POA: Diagnosis not present

## 2019-04-04 DIAGNOSIS — R590 Localized enlarged lymph nodes: Secondary | ICD-10-CM | POA: Diagnosis not present

## 2019-04-04 DIAGNOSIS — Z20828 Contact with and (suspected) exposure to other viral communicable diseases: Secondary | ICD-10-CM | POA: Diagnosis not present

## 2019-04-05 DIAGNOSIS — J441 Chronic obstructive pulmonary disease with (acute) exacerbation: Secondary | ICD-10-CM | POA: Diagnosis not present

## 2019-04-05 DIAGNOSIS — F419 Anxiety disorder, unspecified: Secondary | ICD-10-CM | POA: Diagnosis not present

## 2019-04-05 DIAGNOSIS — R0600 Dyspnea, unspecified: Secondary | ICD-10-CM | POA: Diagnosis not present

## 2019-04-05 DIAGNOSIS — E119 Type 2 diabetes mellitus without complications: Secondary | ICD-10-CM | POA: Diagnosis not present

## 2019-04-05 DIAGNOSIS — J9621 Acute and chronic respiratory failure with hypoxia: Secondary | ICD-10-CM | POA: Diagnosis not present

## 2019-04-05 DIAGNOSIS — Z20828 Contact with and (suspected) exposure to other viral communicable diseases: Secondary | ICD-10-CM | POA: Diagnosis not present

## 2019-04-05 DIAGNOSIS — E785 Hyperlipidemia, unspecified: Secondary | ICD-10-CM | POA: Diagnosis not present

## 2019-04-05 DIAGNOSIS — I10 Essential (primary) hypertension: Secondary | ICD-10-CM | POA: Diagnosis not present

## 2019-04-05 DIAGNOSIS — J439 Emphysema, unspecified: Secondary | ICD-10-CM | POA: Diagnosis not present

## 2019-04-05 DIAGNOSIS — Z954 Presence of other heart-valve replacement: Secondary | ICD-10-CM | POA: Diagnosis not present

## 2019-04-05 DIAGNOSIS — F410 Panic disorder [episodic paroxysmal anxiety] without agoraphobia: Secondary | ICD-10-CM | POA: Diagnosis not present

## 2019-04-05 DIAGNOSIS — Z952 Presence of prosthetic heart valve: Secondary | ICD-10-CM | POA: Diagnosis not present

## 2019-04-05 DIAGNOSIS — I5033 Acute on chronic diastolic (congestive) heart failure: Secondary | ICD-10-CM | POA: Diagnosis not present

## 2019-04-05 DIAGNOSIS — J9601 Acute respiratory failure with hypoxia: Secondary | ICD-10-CM | POA: Diagnosis not present

## 2019-04-05 DIAGNOSIS — I110 Hypertensive heart disease with heart failure: Secondary | ICD-10-CM | POA: Diagnosis not present

## 2019-04-05 DIAGNOSIS — F329 Major depressive disorder, single episode, unspecified: Secondary | ICD-10-CM | POA: Diagnosis not present

## 2019-04-05 DIAGNOSIS — D508 Other iron deficiency anemias: Secondary | ICD-10-CM | POA: Diagnosis not present

## 2019-04-06 DIAGNOSIS — F329 Major depressive disorder, single episode, unspecified: Secondary | ICD-10-CM | POA: Diagnosis not present

## 2019-04-06 DIAGNOSIS — J441 Chronic obstructive pulmonary disease with (acute) exacerbation: Secondary | ICD-10-CM | POA: Diagnosis not present

## 2019-04-06 DIAGNOSIS — F410 Panic disorder [episodic paroxysmal anxiety] without agoraphobia: Secondary | ICD-10-CM | POA: Diagnosis not present

## 2019-04-06 DIAGNOSIS — J9621 Acute and chronic respiratory failure with hypoxia: Secondary | ICD-10-CM | POA: Diagnosis not present

## 2019-04-06 DIAGNOSIS — E119 Type 2 diabetes mellitus without complications: Secondary | ICD-10-CM | POA: Diagnosis not present

## 2019-04-06 DIAGNOSIS — J449 Chronic obstructive pulmonary disease, unspecified: Secondary | ICD-10-CM | POA: Diagnosis not present

## 2019-04-06 DIAGNOSIS — E785 Hyperlipidemia, unspecified: Secondary | ICD-10-CM | POA: Diagnosis not present

## 2019-04-06 DIAGNOSIS — I5033 Acute on chronic diastolic (congestive) heart failure: Secondary | ICD-10-CM | POA: Diagnosis not present

## 2019-04-06 DIAGNOSIS — Z954 Presence of other heart-valve replacement: Secondary | ICD-10-CM | POA: Diagnosis not present

## 2019-04-06 DIAGNOSIS — I10 Essential (primary) hypertension: Secondary | ICD-10-CM | POA: Diagnosis not present

## 2019-04-06 DIAGNOSIS — I110 Hypertensive heart disease with heart failure: Secondary | ICD-10-CM | POA: Diagnosis not present

## 2019-04-06 DIAGNOSIS — Z20828 Contact with and (suspected) exposure to other viral communicable diseases: Secondary | ICD-10-CM | POA: Diagnosis not present

## 2019-04-06 DIAGNOSIS — J9601 Acute respiratory failure with hypoxia: Secondary | ICD-10-CM | POA: Diagnosis not present

## 2019-04-06 DIAGNOSIS — Z952 Presence of prosthetic heart valve: Secondary | ICD-10-CM | POA: Diagnosis not present

## 2019-04-07 DIAGNOSIS — J9601 Acute respiratory failure with hypoxia: Secondary | ICD-10-CM | POA: Diagnosis not present

## 2019-04-07 DIAGNOSIS — J449 Chronic obstructive pulmonary disease, unspecified: Secondary | ICD-10-CM | POA: Diagnosis not present

## 2019-04-07 DIAGNOSIS — Z954 Presence of other heart-valve replacement: Secondary | ICD-10-CM | POA: Diagnosis not present

## 2019-04-07 DIAGNOSIS — Z20828 Contact with and (suspected) exposure to other viral communicable diseases: Secondary | ICD-10-CM | POA: Diagnosis not present

## 2019-04-07 DIAGNOSIS — J439 Emphysema, unspecified: Secondary | ICD-10-CM | POA: Diagnosis not present

## 2019-04-07 DIAGNOSIS — J441 Chronic obstructive pulmonary disease with (acute) exacerbation: Secondary | ICD-10-CM | POA: Diagnosis not present

## 2019-04-07 DIAGNOSIS — I5033 Acute on chronic diastolic (congestive) heart failure: Secondary | ICD-10-CM | POA: Diagnosis not present

## 2019-04-07 DIAGNOSIS — F329 Major depressive disorder, single episode, unspecified: Secondary | ICD-10-CM | POA: Diagnosis not present

## 2019-04-07 DIAGNOSIS — F410 Panic disorder [episodic paroxysmal anxiety] without agoraphobia: Secondary | ICD-10-CM | POA: Diagnosis not present

## 2019-04-07 DIAGNOSIS — E785 Hyperlipidemia, unspecified: Secondary | ICD-10-CM | POA: Diagnosis not present

## 2019-04-07 DIAGNOSIS — I10 Essential (primary) hypertension: Secondary | ICD-10-CM | POA: Diagnosis not present

## 2019-04-07 DIAGNOSIS — Z952 Presence of prosthetic heart valve: Secondary | ICD-10-CM | POA: Diagnosis not present

## 2019-04-07 DIAGNOSIS — E119 Type 2 diabetes mellitus without complications: Secondary | ICD-10-CM | POA: Diagnosis not present

## 2019-04-07 DIAGNOSIS — J410 Simple chronic bronchitis: Secondary | ICD-10-CM | POA: Diagnosis not present

## 2019-04-07 DIAGNOSIS — J9621 Acute and chronic respiratory failure with hypoxia: Secondary | ICD-10-CM | POA: Diagnosis not present

## 2019-04-07 DIAGNOSIS — I110 Hypertensive heart disease with heart failure: Secondary | ICD-10-CM | POA: Diagnosis not present

## 2019-04-08 DIAGNOSIS — E119 Type 2 diabetes mellitus without complications: Secondary | ICD-10-CM | POA: Diagnosis not present

## 2019-04-08 DIAGNOSIS — Z952 Presence of prosthetic heart valve: Secondary | ICD-10-CM | POA: Diagnosis not present

## 2019-04-08 DIAGNOSIS — I5033 Acute on chronic diastolic (congestive) heart failure: Secondary | ICD-10-CM | POA: Diagnosis not present

## 2019-04-08 DIAGNOSIS — F329 Major depressive disorder, single episode, unspecified: Secondary | ICD-10-CM | POA: Diagnosis not present

## 2019-04-08 DIAGNOSIS — J441 Chronic obstructive pulmonary disease with (acute) exacerbation: Secondary | ICD-10-CM | POA: Diagnosis not present

## 2019-04-08 DIAGNOSIS — J449 Chronic obstructive pulmonary disease, unspecified: Secondary | ICD-10-CM | POA: Diagnosis not present

## 2019-04-08 DIAGNOSIS — J9621 Acute and chronic respiratory failure with hypoxia: Secondary | ICD-10-CM | POA: Diagnosis not present

## 2019-04-08 DIAGNOSIS — J9601 Acute respiratory failure with hypoxia: Secondary | ICD-10-CM | POA: Diagnosis not present

## 2019-04-08 DIAGNOSIS — F410 Panic disorder [episodic paroxysmal anxiety] without agoraphobia: Secondary | ICD-10-CM | POA: Diagnosis not present

## 2019-04-08 DIAGNOSIS — I10 Essential (primary) hypertension: Secondary | ICD-10-CM | POA: Diagnosis not present

## 2019-04-08 DIAGNOSIS — I110 Hypertensive heart disease with heart failure: Secondary | ICD-10-CM | POA: Diagnosis not present

## 2019-04-08 DIAGNOSIS — E785 Hyperlipidemia, unspecified: Secondary | ICD-10-CM | POA: Diagnosis not present

## 2019-04-08 DIAGNOSIS — Z20828 Contact with and (suspected) exposure to other viral communicable diseases: Secondary | ICD-10-CM | POA: Diagnosis not present

## 2019-04-08 DIAGNOSIS — Z954 Presence of other heart-valve replacement: Secondary | ICD-10-CM | POA: Diagnosis not present

## 2019-04-12 DIAGNOSIS — J449 Chronic obstructive pulmonary disease, unspecified: Secondary | ICD-10-CM | POA: Diagnosis not present

## 2019-04-15 DIAGNOSIS — F3131 Bipolar disorder, current episode depressed, mild: Secondary | ICD-10-CM | POA: Diagnosis not present

## 2019-05-13 DIAGNOSIS — J449 Chronic obstructive pulmonary disease, unspecified: Secondary | ICD-10-CM | POA: Diagnosis not present

## 2019-05-31 DIAGNOSIS — J9601 Acute respiratory failure with hypoxia: Secondary | ICD-10-CM | POA: Diagnosis not present

## 2019-05-31 DIAGNOSIS — I5032 Chronic diastolic (congestive) heart failure: Secondary | ICD-10-CM | POA: Diagnosis not present

## 2019-05-31 DIAGNOSIS — R0603 Acute respiratory distress: Secondary | ICD-10-CM | POA: Diagnosis not present

## 2019-05-31 DIAGNOSIS — E440 Moderate protein-calorie malnutrition: Secondary | ICD-10-CM | POA: Diagnosis not present

## 2019-05-31 DIAGNOSIS — N390 Urinary tract infection, site not specified: Secondary | ICD-10-CM | POA: Diagnosis not present

## 2019-05-31 DIAGNOSIS — D6832 Hemorrhagic disorder due to extrinsic circulating anticoagulants: Secondary | ICD-10-CM | POA: Diagnosis not present

## 2019-05-31 DIAGNOSIS — J189 Pneumonia, unspecified organism: Secondary | ICD-10-CM | POA: Diagnosis not present

## 2019-05-31 DIAGNOSIS — K922 Gastrointestinal hemorrhage, unspecified: Secondary | ICD-10-CM | POA: Diagnosis not present

## 2019-05-31 DIAGNOSIS — M2550 Pain in unspecified joint: Secondary | ICD-10-CM | POA: Diagnosis not present

## 2019-05-31 DIAGNOSIS — A419 Sepsis, unspecified organism: Secondary | ICD-10-CM | POA: Diagnosis not present

## 2019-05-31 DIAGNOSIS — J449 Chronic obstructive pulmonary disease, unspecified: Secondary | ICD-10-CM | POA: Diagnosis not present

## 2019-05-31 DIAGNOSIS — I110 Hypertensive heart disease with heart failure: Secondary | ICD-10-CM | POA: Diagnosis not present

## 2019-05-31 DIAGNOSIS — D500 Iron deficiency anemia secondary to blood loss (chronic): Secondary | ICD-10-CM | POA: Diagnosis not present

## 2019-05-31 DIAGNOSIS — I82412 Acute embolism and thrombosis of left femoral vein: Secondary | ICD-10-CM | POA: Diagnosis not present

## 2019-05-31 DIAGNOSIS — K2960 Other gastritis without bleeding: Secondary | ICD-10-CM | POA: Diagnosis not present

## 2019-05-31 DIAGNOSIS — Z952 Presence of prosthetic heart valve: Secondary | ICD-10-CM | POA: Diagnosis not present

## 2019-05-31 DIAGNOSIS — K449 Diaphragmatic hernia without obstruction or gangrene: Secondary | ICD-10-CM | POA: Diagnosis not present

## 2019-05-31 DIAGNOSIS — R0602 Shortness of breath: Secondary | ICD-10-CM | POA: Diagnosis not present

## 2019-05-31 DIAGNOSIS — K648 Other hemorrhoids: Secondary | ICD-10-CM | POA: Diagnosis not present

## 2019-05-31 DIAGNOSIS — E119 Type 2 diabetes mellitus without complications: Secondary | ICD-10-CM | POA: Diagnosis not present

## 2019-05-31 DIAGNOSIS — R918 Other nonspecific abnormal finding of lung field: Secondary | ICD-10-CM | POA: Diagnosis not present

## 2019-05-31 DIAGNOSIS — J9621 Acute and chronic respiratory failure with hypoxia: Secondary | ICD-10-CM | POA: Diagnosis not present

## 2019-05-31 DIAGNOSIS — E876 Hypokalemia: Secondary | ICD-10-CM | POA: Diagnosis not present

## 2019-06-02 IMAGING — CR DG CHEST 1V PORT
1 series · 1 of 1 positions shown · non-contrast
Comparison: 07/11/2017

CLINICAL DATA: Cough, syncope

EXAM:
PORTABLE CHEST 1 VIEW

[portable]
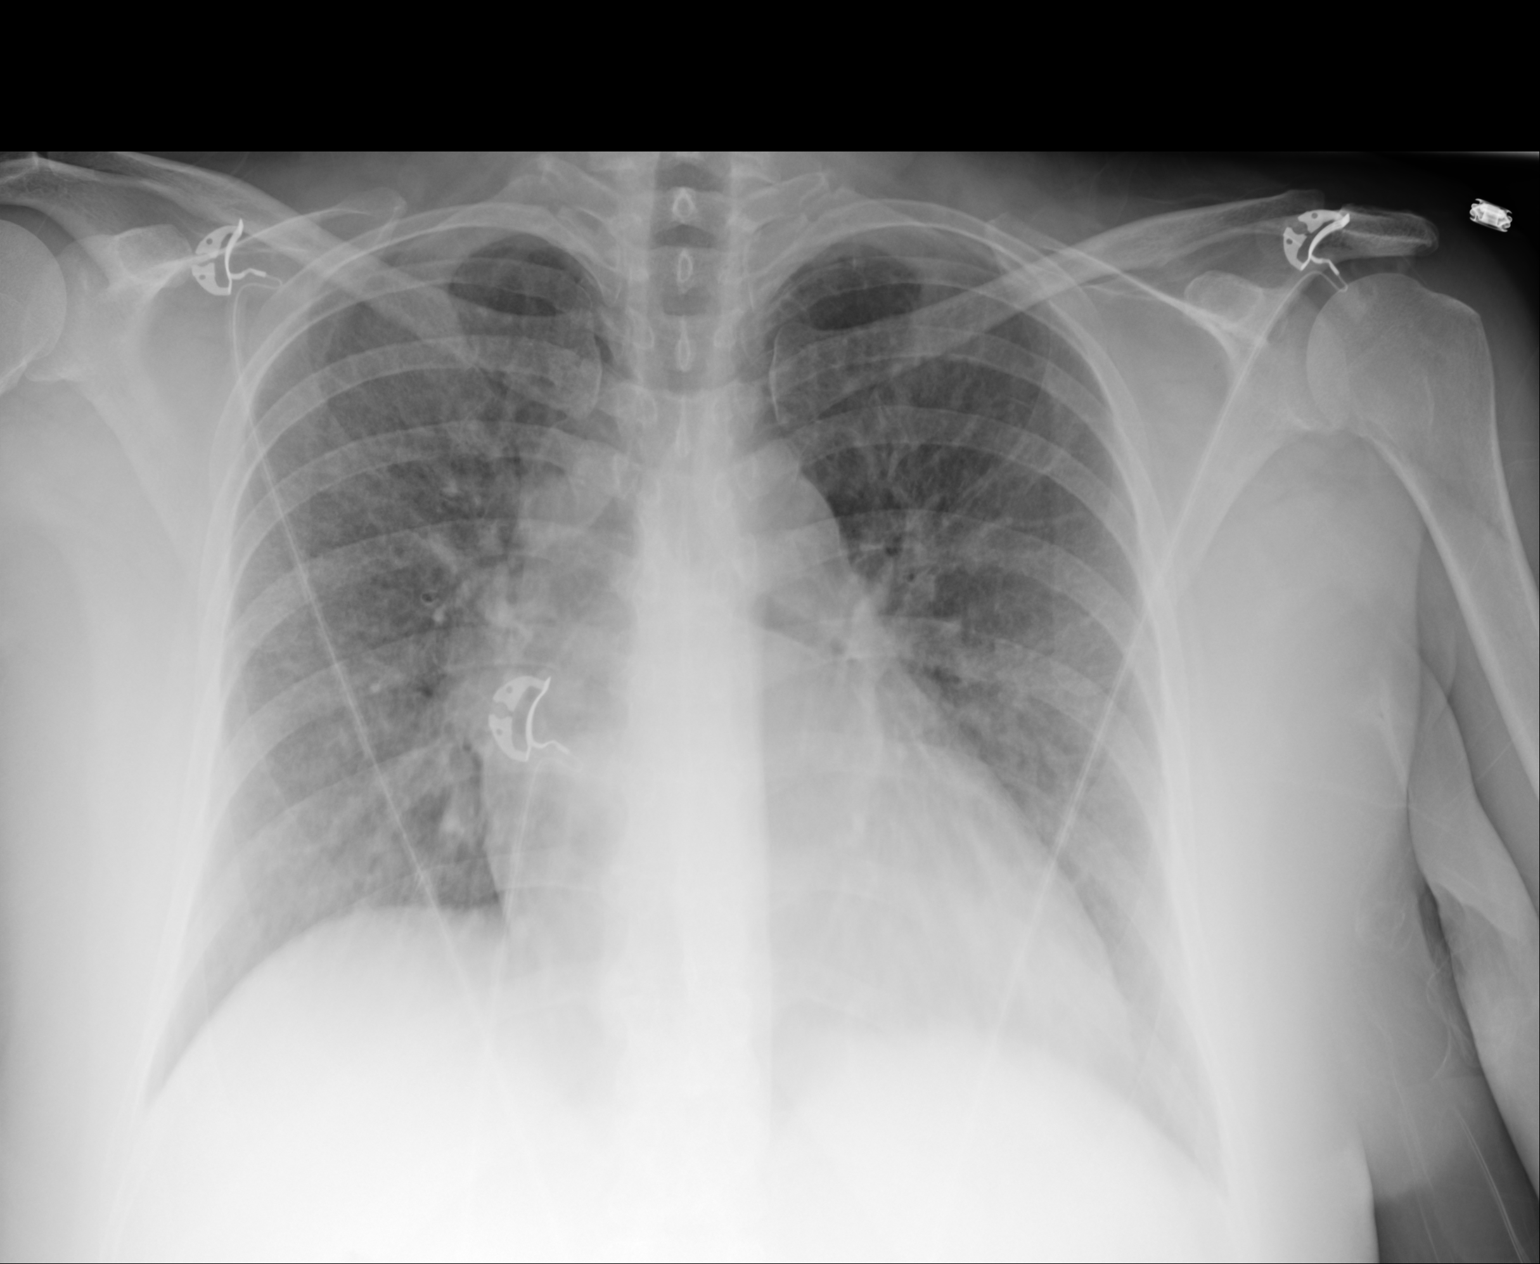

[1 of 1 positions shown; findings below may reference images not displayed]

FINDINGS: Cardiomegaly. Peribronchial thickening. Interstitial prominence in
the lungs could reflect interstitial edema or bronchitic changes. No
effusions or acute bony abnormality.
IMPRESSION: Cardiomegaly. Peribronchial thickening compatible with bronchitic
changes. Interstitial prominence could be related to bronchitis or
mild interstitial edema.

## 2019-06-03 IMAGING — CR DG ABDOMEN 1V
1 series · 1 of 1 positions shown · non-contrast
Comparison: None.

CLINICAL DATA: Capsule study

EXAM:
ABDOMEN - 1 VIEW

[supine ap]
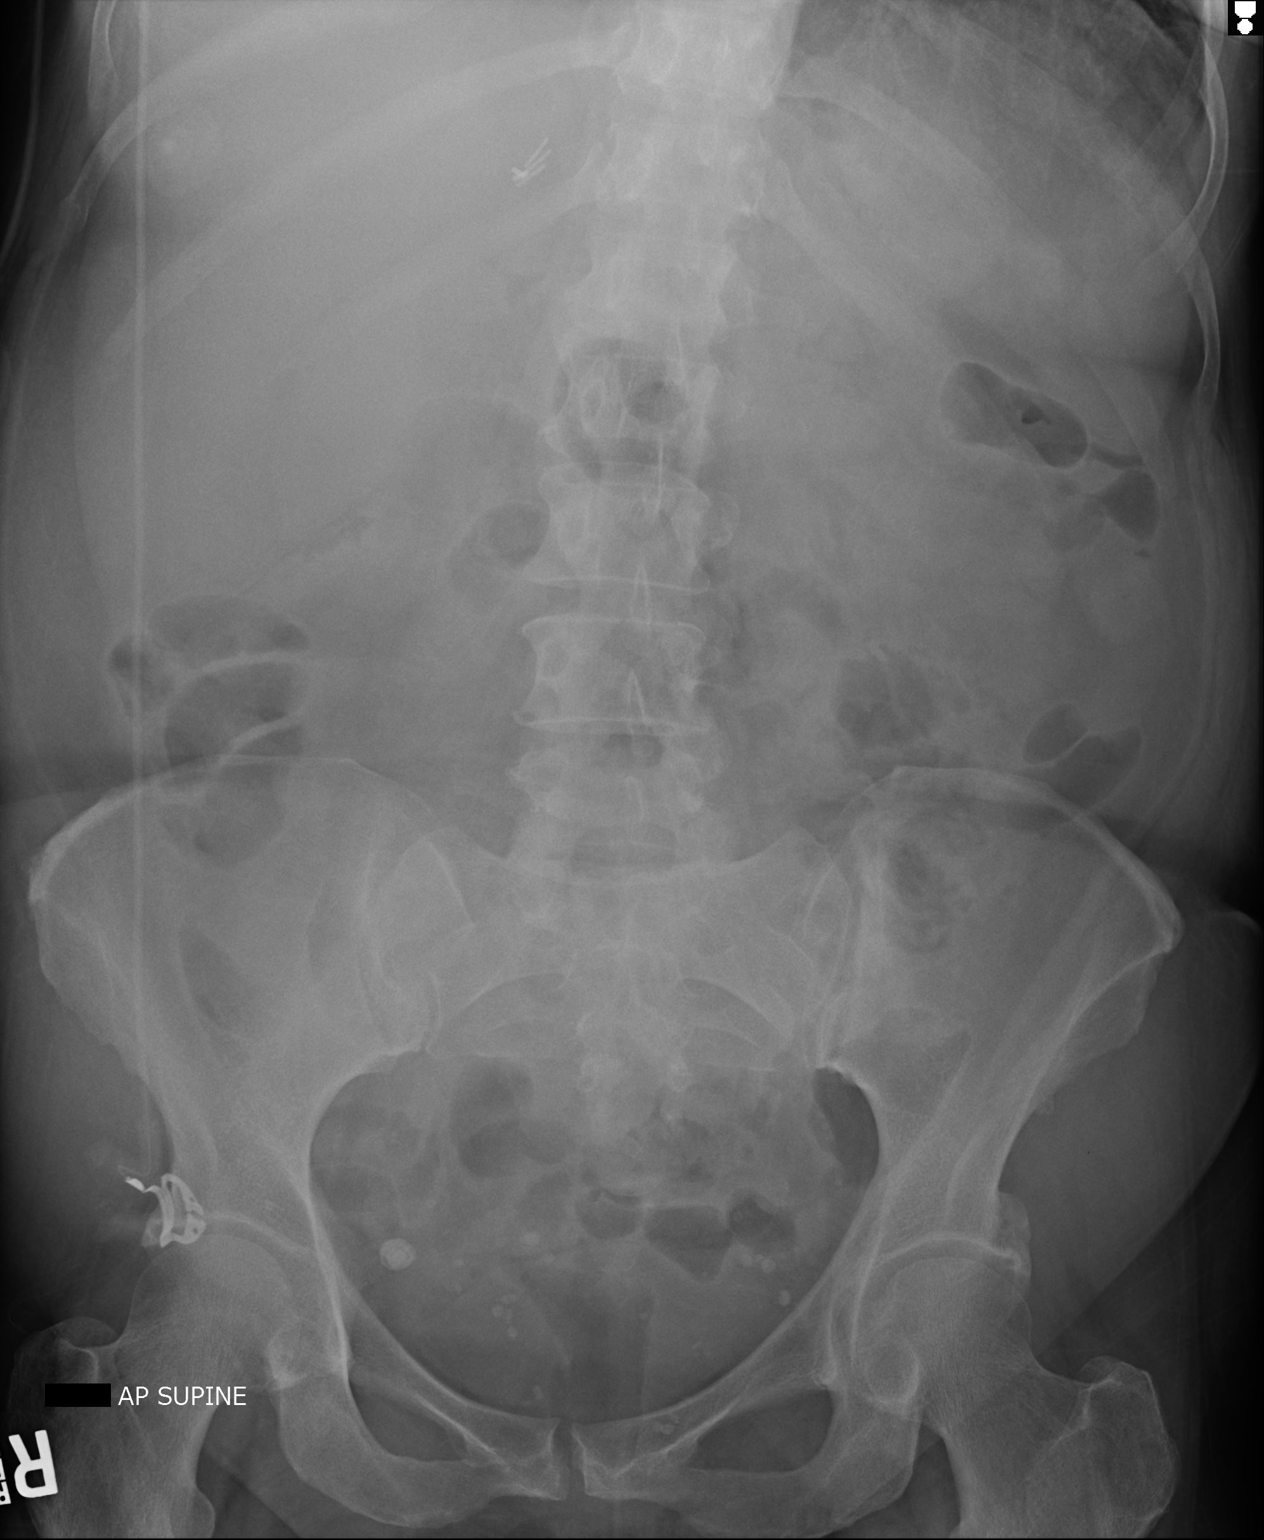

[1 of 1 positions shown; findings below may reference images not displayed]

FINDINGS: Surgical clips in the right upper quadrant. Nonobstructed gas
pattern. Calcified pelvic phleboliths. No definite metallic
radiopaque foreign body is seen.
IMPRESSION: 1. No definite radiopaque metallic foreign body seen
2. Nonobstructed gas pattern.

## 2019-06-10 IMAGING — DX DG CHEST 1V PORT
1 series · 1 of 1 positions shown · non-contrast
Comparison: 08/26/2017

CLINICAL DATA: Respiratory failure

EXAM:
PORTABLE CHEST 1 VIEW

[chest ap]
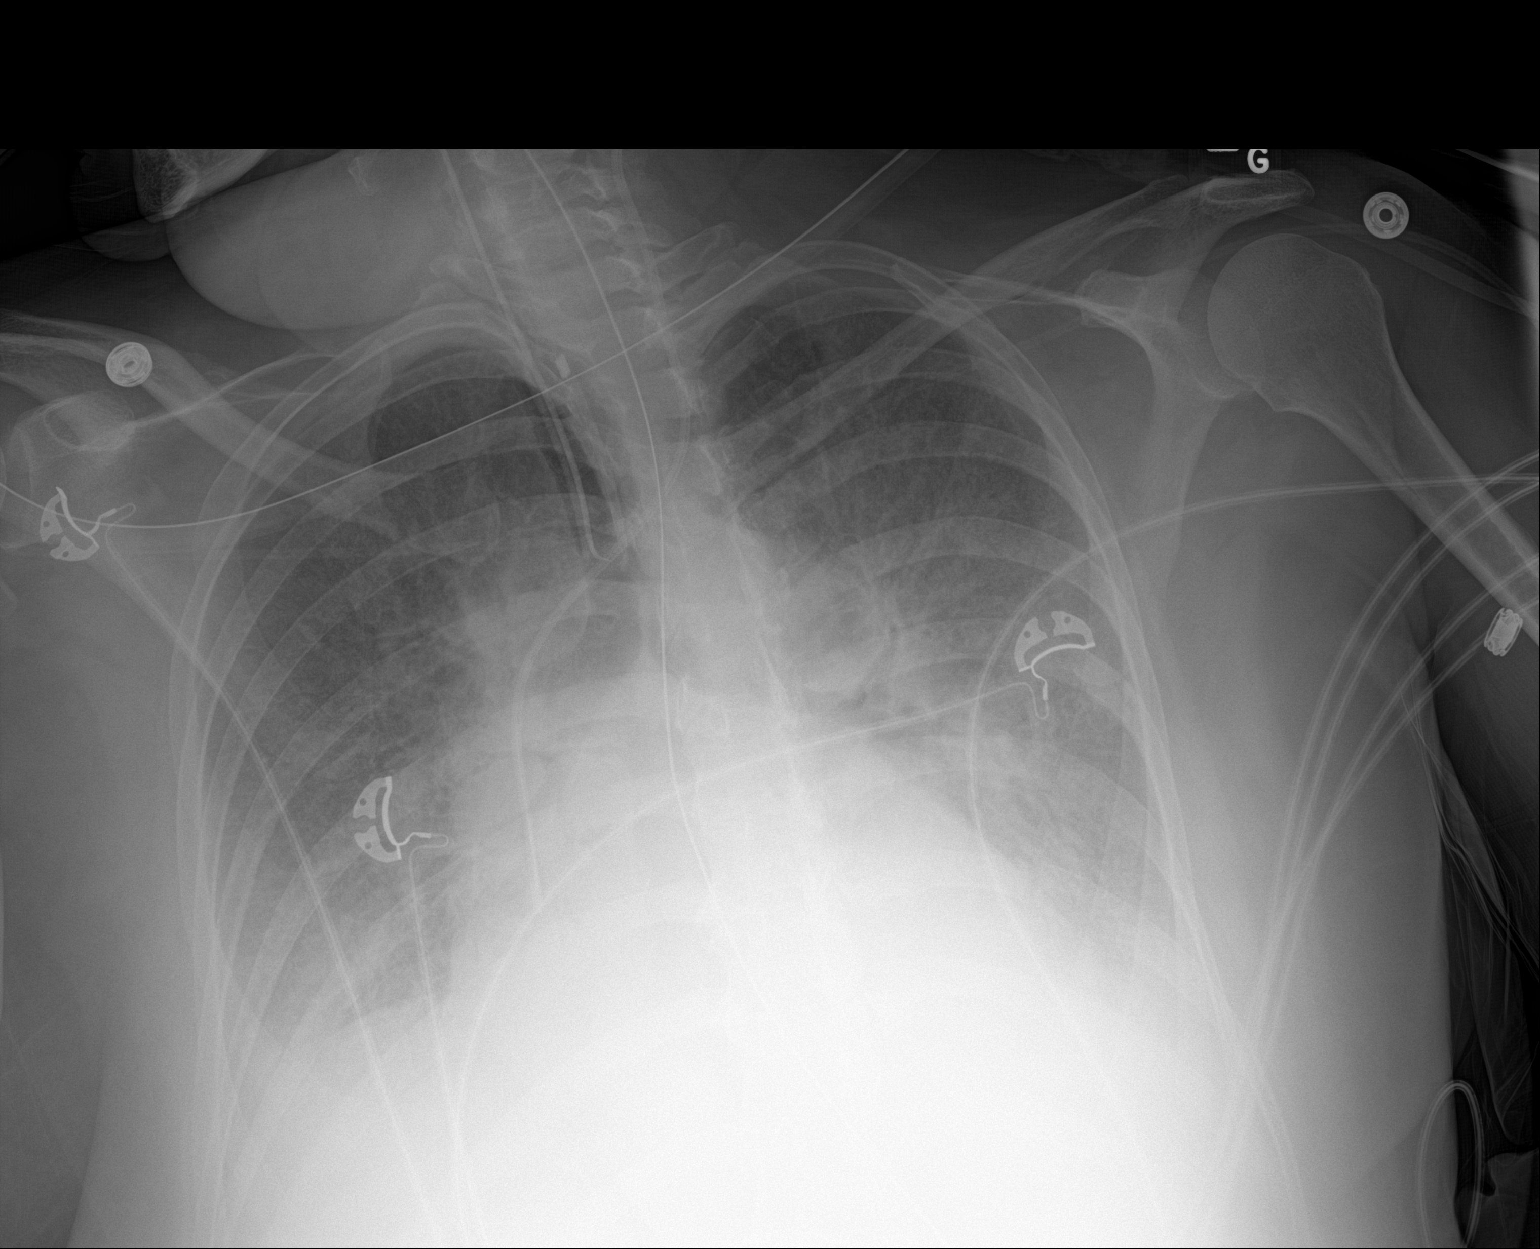

[1 of 1 positions shown; findings below may reference images not displayed]

FINDINGS: Support devices are stable. Cardiomegaly. Bilateral diffuse airspace
opacities, likely edema/CHF. Suspect layering effusions. No real
change since prior study.
IMPRESSION: Probable edema/CHF and layering effusions.  No real change.

## 2019-06-10 IMAGING — DX DG ABD PORTABLE 1V
2 series · 2 of 2 positions shown · non-contrast
Comparison: Abdomen film of 08/24/2017

CLINICAL DATA: Abdominal distention

EXAM:
PORTABLE ABDOMEN - 1 VIEW

[abdomen kub (1 of 2)]
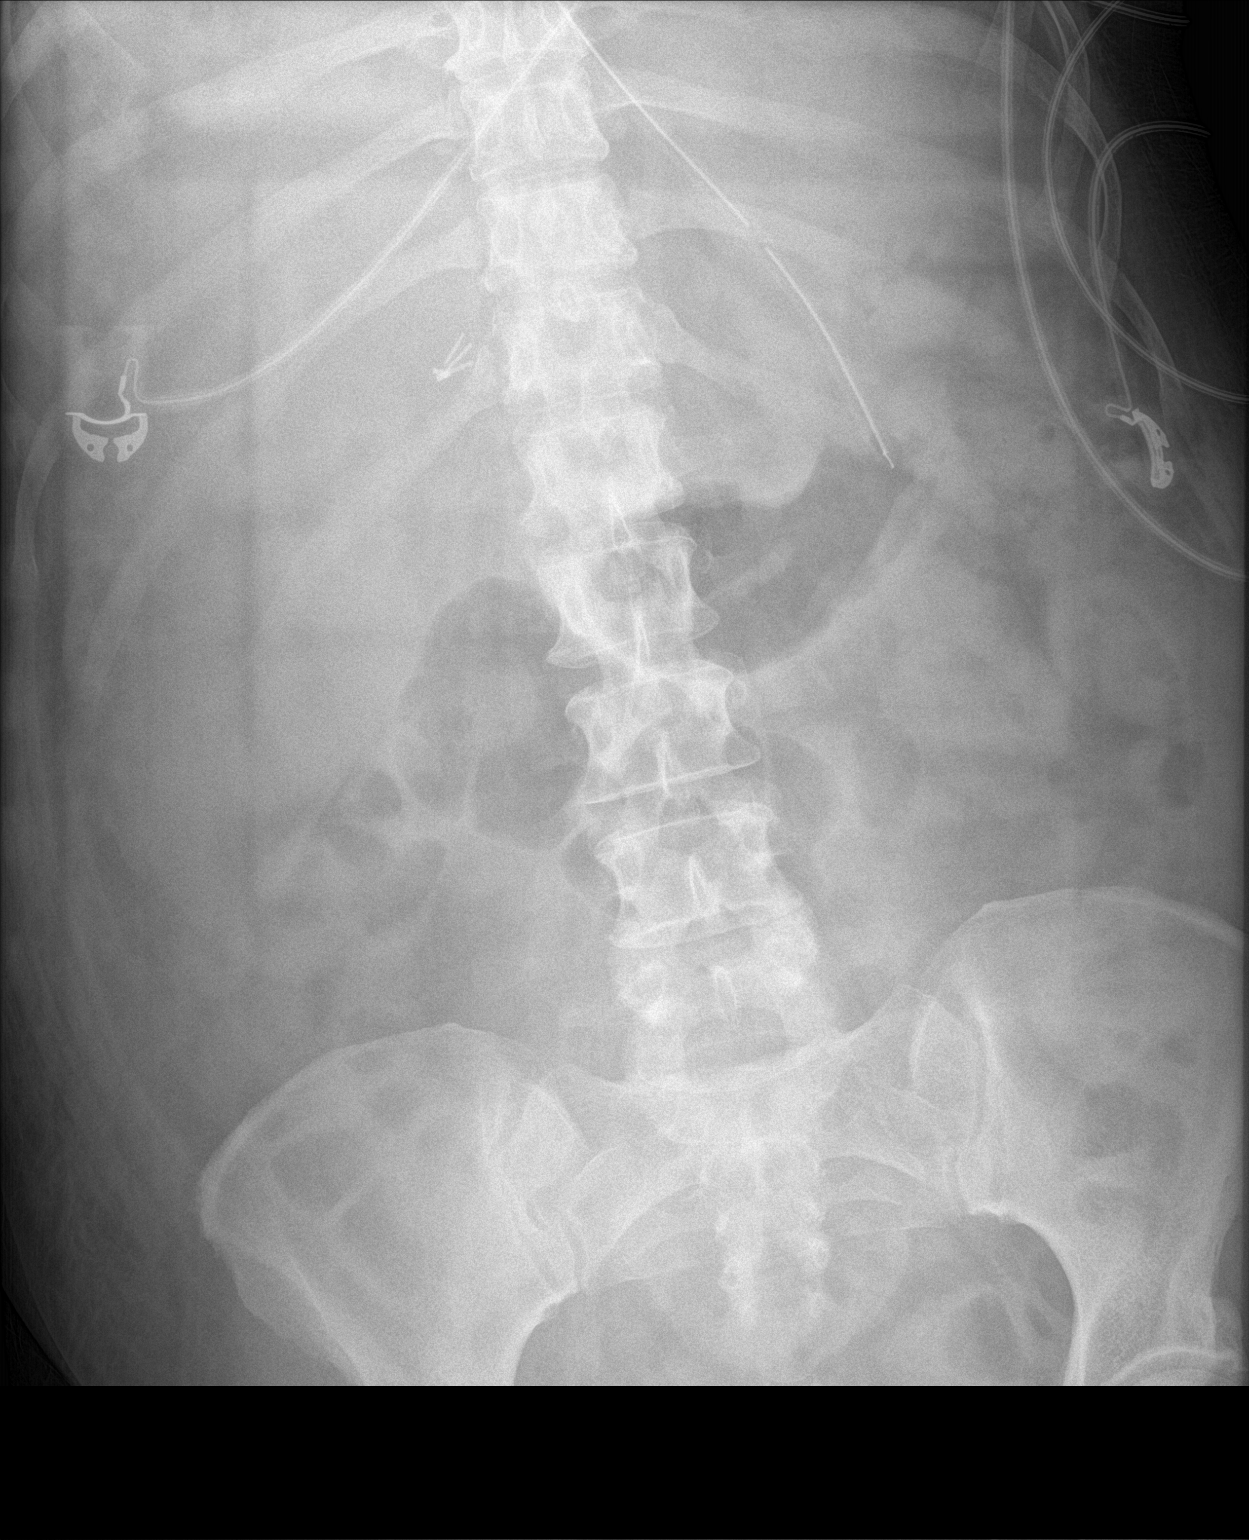

[abdomen kub (2 of 2)]
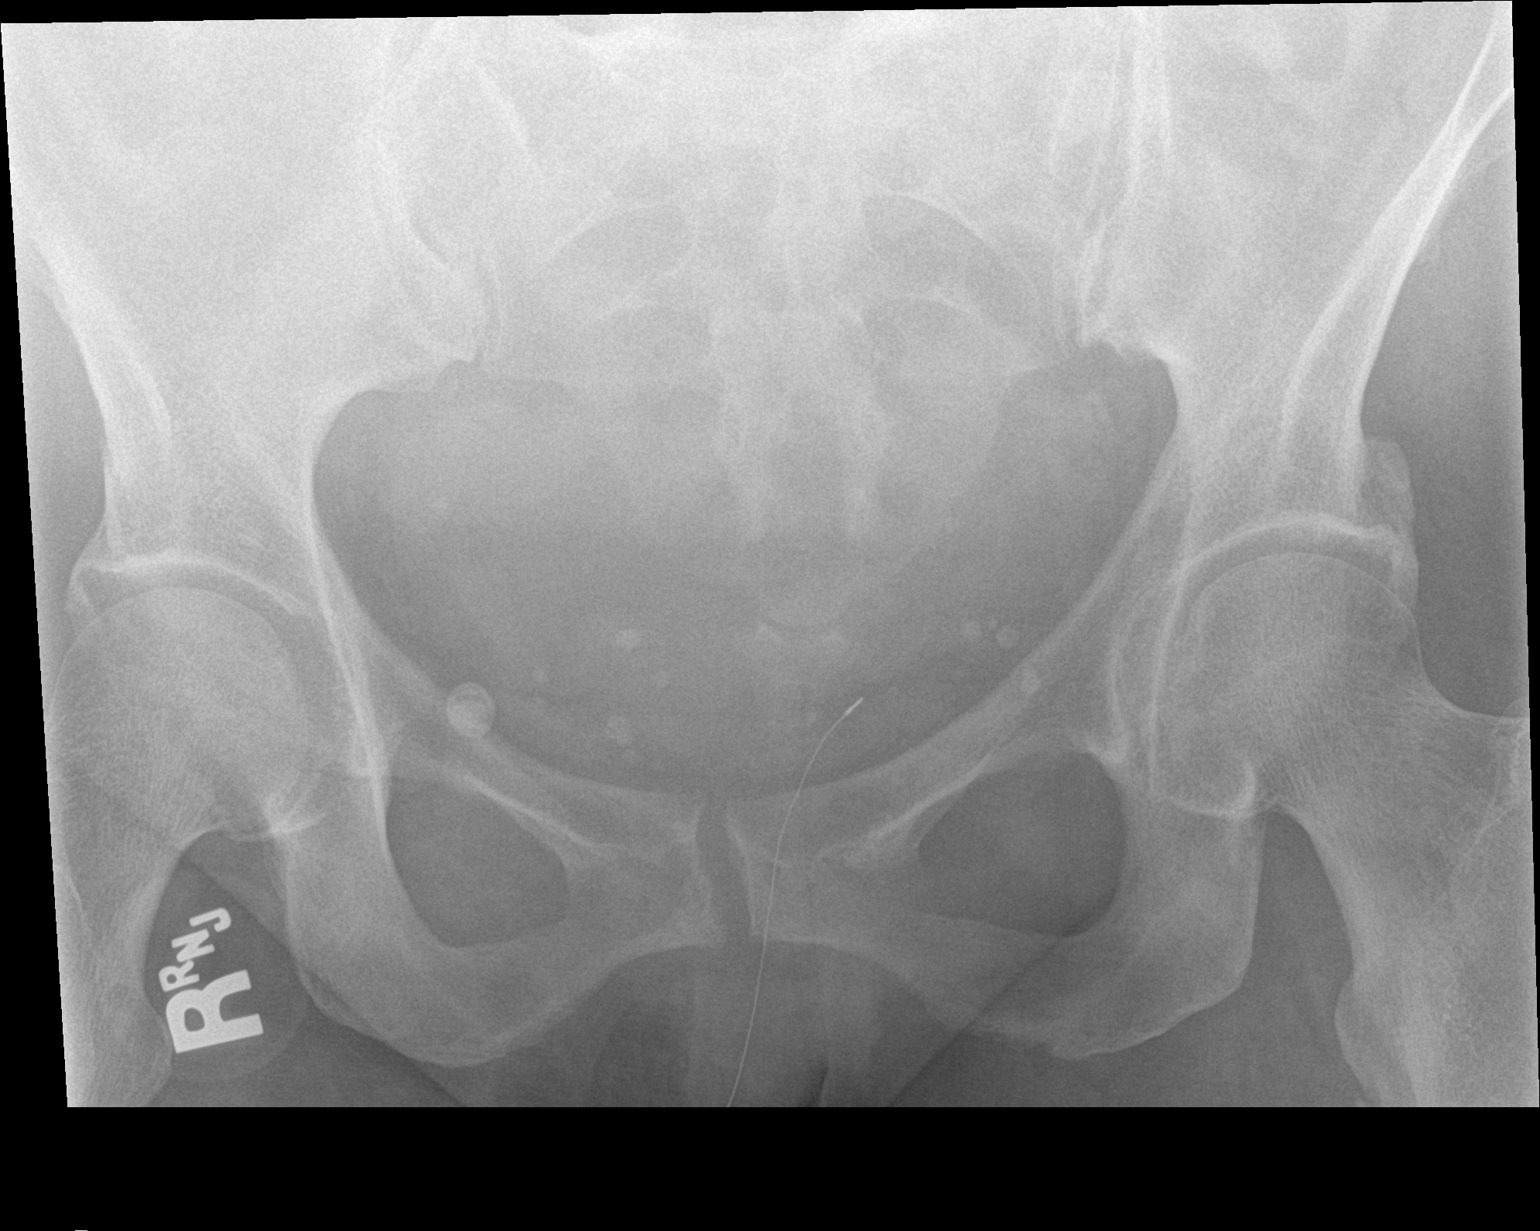

[2 of 2 positions shown; findings below may reference images not displayed]

FINDINGS: The tip of the feeding tube overlies the mid proximal body of the
stomach. The bowel gas pattern is nonspecific. No opaque calculi are
seen. Surgical clips are present from prior cholecystectomy.
IMPRESSION: 1. Tip of NG tube overlies the mid proximal body of the stomach.
2. Nonspecific bowel gas pattern.  No bowel obstruction.

## 2019-06-24 DIAGNOSIS — I38 Endocarditis, valve unspecified: Secondary | ICD-10-CM | POA: Diagnosis not present

## 2019-06-24 DIAGNOSIS — E119 Type 2 diabetes mellitus without complications: Secondary | ICD-10-CM | POA: Diagnosis not present

## 2019-06-24 DIAGNOSIS — Z720 Tobacco use: Secondary | ICD-10-CM | POA: Diagnosis not present

## 2019-06-24 DIAGNOSIS — I509 Heart failure, unspecified: Secondary | ICD-10-CM | POA: Diagnosis not present

## 2019-06-24 DIAGNOSIS — E1159 Type 2 diabetes mellitus with other circulatory complications: Secondary | ICD-10-CM | POA: Diagnosis not present

## 2019-06-24 DIAGNOSIS — F418 Other specified anxiety disorders: Secondary | ICD-10-CM | POA: Diagnosis not present

## 2019-06-24 DIAGNOSIS — I10 Essential (primary) hypertension: Secondary | ICD-10-CM | POA: Diagnosis not present

## 2019-06-24 DIAGNOSIS — E1169 Type 2 diabetes mellitus with other specified complication: Secondary | ICD-10-CM | POA: Diagnosis not present

## 2019-06-24 DIAGNOSIS — Z0000 Encounter for general adult medical examination without abnormal findings: Secondary | ICD-10-CM | POA: Diagnosis not present

## 2019-07-02 DIAGNOSIS — I38 Endocarditis, valve unspecified: Secondary | ICD-10-CM | POA: Diagnosis not present

## 2019-07-13 DIAGNOSIS — J449 Chronic obstructive pulmonary disease, unspecified: Secondary | ICD-10-CM | POA: Diagnosis not present

## 2019-07-13 DIAGNOSIS — I509 Heart failure, unspecified: Secondary | ICD-10-CM | POA: Diagnosis not present

## 2019-07-24 DIAGNOSIS — E119 Type 2 diabetes mellitus without complications: Secondary | ICD-10-CM | POA: Diagnosis not present

## 2019-07-24 DIAGNOSIS — I509 Heart failure, unspecified: Secondary | ICD-10-CM | POA: Diagnosis not present

## 2019-07-24 DIAGNOSIS — J449 Chronic obstructive pulmonary disease, unspecified: Secondary | ICD-10-CM | POA: Diagnosis not present

## 2019-08-04 DIAGNOSIS — Z1231 Encounter for screening mammogram for malignant neoplasm of breast: Secondary | ICD-10-CM | POA: Diagnosis not present

## 2019-08-05 DIAGNOSIS — I38 Endocarditis, valve unspecified: Secondary | ICD-10-CM | POA: Diagnosis not present

## 2019-08-05 DIAGNOSIS — Z79899 Other long term (current) drug therapy: Secondary | ICD-10-CM | POA: Diagnosis not present

## 2019-08-05 DIAGNOSIS — Z7901 Long term (current) use of anticoagulants: Secondary | ICD-10-CM | POA: Diagnosis not present

## 2019-08-11 DIAGNOSIS — Z20828 Contact with and (suspected) exposure to other viral communicable diseases: Secondary | ICD-10-CM | POA: Diagnosis not present

## 2019-08-11 DIAGNOSIS — Z20822 Contact with and (suspected) exposure to covid-19: Secondary | ICD-10-CM | POA: Diagnosis not present

## 2019-08-11 DIAGNOSIS — B084 Enteroviral vesicular stomatitis with exanthem: Secondary | ICD-10-CM | POA: Diagnosis not present

## 2019-08-11 DIAGNOSIS — R21 Rash and other nonspecific skin eruption: Secondary | ICD-10-CM | POA: Diagnosis not present

## 2019-08-13 DIAGNOSIS — F3131 Bipolar disorder, current episode depressed, mild: Secondary | ICD-10-CM | POA: Diagnosis not present

## 2019-08-13 DIAGNOSIS — J449 Chronic obstructive pulmonary disease, unspecified: Secondary | ICD-10-CM | POA: Diagnosis not present

## 2019-08-27 DIAGNOSIS — E1159 Type 2 diabetes mellitus with other circulatory complications: Secondary | ICD-10-CM | POA: Diagnosis not present

## 2019-08-27 DIAGNOSIS — E1169 Type 2 diabetes mellitus with other specified complication: Secondary | ICD-10-CM | POA: Diagnosis not present

## 2019-08-27 DIAGNOSIS — B09 Unspecified viral infection characterized by skin and mucous membrane lesions: Secondary | ICD-10-CM | POA: Diagnosis not present

## 2019-08-27 DIAGNOSIS — Z7901 Long term (current) use of anticoagulants: Secondary | ICD-10-CM | POA: Diagnosis not present

## 2019-08-27 DIAGNOSIS — E785 Hyperlipidemia, unspecified: Secondary | ICD-10-CM | POA: Diagnosis not present

## 2019-08-27 DIAGNOSIS — I509 Heart failure, unspecified: Secondary | ICD-10-CM | POA: Diagnosis not present

## 2019-08-27 DIAGNOSIS — E119 Type 2 diabetes mellitus without complications: Secondary | ICD-10-CM | POA: Diagnosis not present

## 2019-08-27 DIAGNOSIS — I38 Endocarditis, valve unspecified: Secondary | ICD-10-CM | POA: Diagnosis not present

## 2019-08-27 DIAGNOSIS — I10 Essential (primary) hypertension: Secondary | ICD-10-CM | POA: Diagnosis not present

## 2019-09-10 DIAGNOSIS — I38 Endocarditis, valve unspecified: Secondary | ICD-10-CM | POA: Diagnosis not present

## 2019-09-10 DIAGNOSIS — D649 Anemia, unspecified: Secondary | ICD-10-CM | POA: Diagnosis not present

## 2019-09-10 DIAGNOSIS — D72829 Elevated white blood cell count, unspecified: Secondary | ICD-10-CM | POA: Diagnosis not present

## 2019-09-10 DIAGNOSIS — J449 Chronic obstructive pulmonary disease, unspecified: Secondary | ICD-10-CM | POA: Diagnosis not present

## 2019-09-10 DIAGNOSIS — Z7901 Long term (current) use of anticoagulants: Secondary | ICD-10-CM | POA: Diagnosis not present

## 2019-09-10 DIAGNOSIS — R21 Rash and other nonspecific skin eruption: Secondary | ICD-10-CM | POA: Diagnosis not present

## 2019-10-11 DIAGNOSIS — J449 Chronic obstructive pulmonary disease, unspecified: Secondary | ICD-10-CM | POA: Diagnosis not present

## 2019-10-14 DIAGNOSIS — I5030 Unspecified diastolic (congestive) heart failure: Secondary | ICD-10-CM | POA: Diagnosis not present

## 2019-10-14 DIAGNOSIS — I10 Essential (primary) hypertension: Secondary | ICD-10-CM | POA: Diagnosis not present

## 2019-10-14 DIAGNOSIS — Z1159 Encounter for screening for other viral diseases: Secondary | ICD-10-CM | POA: Diagnosis not present

## 2019-10-14 DIAGNOSIS — J9692 Respiratory failure, unspecified with hypercapnia: Secondary | ICD-10-CM | POA: Diagnosis not present

## 2019-10-14 DIAGNOSIS — G4730 Sleep apnea, unspecified: Secondary | ICD-10-CM | POA: Diagnosis not present

## 2019-10-14 DIAGNOSIS — R0602 Shortness of breath: Secondary | ICD-10-CM | POA: Diagnosis not present

## 2019-10-28 DIAGNOSIS — Z7901 Long term (current) use of anticoagulants: Secondary | ICD-10-CM | POA: Diagnosis not present

## 2019-10-28 DIAGNOSIS — E119 Type 2 diabetes mellitus without complications: Secondary | ICD-10-CM | POA: Diagnosis not present

## 2019-10-28 DIAGNOSIS — E1169 Type 2 diabetes mellitus with other specified complication: Secondary | ICD-10-CM | POA: Diagnosis not present

## 2019-10-28 DIAGNOSIS — E785 Hyperlipidemia, unspecified: Secondary | ICD-10-CM | POA: Diagnosis not present

## 2019-10-28 DIAGNOSIS — Z720 Tobacco use: Secondary | ICD-10-CM | POA: Diagnosis not present

## 2019-10-28 DIAGNOSIS — L299 Pruritus, unspecified: Secondary | ICD-10-CM | POA: Diagnosis not present

## 2019-11-09 DIAGNOSIS — Z7901 Long term (current) use of anticoagulants: Secondary | ICD-10-CM | POA: Diagnosis not present

## 2019-11-10 DIAGNOSIS — F3131 Bipolar disorder, current episode depressed, mild: Secondary | ICD-10-CM | POA: Diagnosis not present

## 2019-11-10 DIAGNOSIS — J449 Chronic obstructive pulmonary disease, unspecified: Secondary | ICD-10-CM | POA: Diagnosis not present

## 2019-11-16 DIAGNOSIS — R05 Cough: Secondary | ICD-10-CM | POA: Diagnosis not present

## 2019-11-16 DIAGNOSIS — Z7901 Long term (current) use of anticoagulants: Secondary | ICD-10-CM | POA: Diagnosis not present

## 2019-11-30 DIAGNOSIS — R05 Cough: Secondary | ICD-10-CM | POA: Diagnosis not present

## 2019-11-30 DIAGNOSIS — I509 Heart failure, unspecified: Secondary | ICD-10-CM | POA: Diagnosis not present

## 2019-11-30 DIAGNOSIS — Z7901 Long term (current) use of anticoagulants: Secondary | ICD-10-CM | POA: Diagnosis not present

## 2019-11-30 DIAGNOSIS — I38 Endocarditis, valve unspecified: Secondary | ICD-10-CM | POA: Diagnosis not present

## 2019-12-08 ENCOUNTER — Other Ambulatory Visit: Payer: Self-pay

## 2019-12-08 ENCOUNTER — Emergency Department (HOSPITAL_COMMUNITY): Payer: Medicare PPO

## 2019-12-08 ENCOUNTER — Inpatient Hospital Stay (HOSPITAL_COMMUNITY)
Admission: EM | Admit: 2019-12-08 | Discharge: 2019-12-12 | DRG: 286 | Disposition: A | Discharge status: Home / Self Care | Payer: Medicare PPO | Attending: Internal Medicine | Admitting: Internal Medicine

## 2019-12-08 ENCOUNTER — Encounter (HOSPITAL_COMMUNITY): Payer: Self-pay | Admitting: Emergency Medicine

## 2019-12-08 DIAGNOSIS — Z91138 Patient's unintentional underdosing of medication regimen for other reason: Secondary | ICD-10-CM | POA: Diagnosis not present

## 2019-12-08 DIAGNOSIS — B379 Candidiasis, unspecified: Secondary | ICD-10-CM | POA: Diagnosis not present

## 2019-12-08 DIAGNOSIS — R000 Tachycardia, unspecified: Secondary | ICD-10-CM | POA: Diagnosis not present

## 2019-12-08 DIAGNOSIS — Z825 Family history of asthma and other chronic lower respiratory diseases: Secondary | ICD-10-CM

## 2019-12-08 DIAGNOSIS — E785 Hyperlipidemia, unspecified: Secondary | ICD-10-CM | POA: Diagnosis present

## 2019-12-08 DIAGNOSIS — E876 Hypokalemia: Secondary | ICD-10-CM | POA: Diagnosis not present

## 2019-12-08 DIAGNOSIS — J449 Chronic obstructive pulmonary disease, unspecified: Secondary | ICD-10-CM | POA: Diagnosis not present

## 2019-12-08 DIAGNOSIS — G4733 Obstructive sleep apnea (adult) (pediatric): Secondary | ICD-10-CM | POA: Diagnosis present

## 2019-12-08 DIAGNOSIS — K219 Gastro-esophageal reflux disease without esophagitis: Secondary | ICD-10-CM | POA: Diagnosis present

## 2019-12-08 DIAGNOSIS — D500 Iron deficiency anemia secondary to blood loss (chronic): Secondary | ICD-10-CM | POA: Diagnosis not present

## 2019-12-08 DIAGNOSIS — F419 Anxiety disorder, unspecified: Secondary | ICD-10-CM

## 2019-12-08 DIAGNOSIS — J9621 Acute and chronic respiratory failure with hypoxia: Secondary | ICD-10-CM | POA: Diagnosis not present

## 2019-12-08 DIAGNOSIS — Z7984 Long term (current) use of oral hypoglycemic drugs: Secondary | ICD-10-CM

## 2019-12-08 DIAGNOSIS — J90 Pleural effusion, not elsewhere classified: Secondary | ICD-10-CM | POA: Diagnosis not present

## 2019-12-08 DIAGNOSIS — Z7901 Long term (current) use of anticoagulants: Secondary | ICD-10-CM | POA: Diagnosis not present

## 2019-12-08 DIAGNOSIS — T501X6A Underdosing of loop [high-ceiling] diuretics, initial encounter: Secondary | ICD-10-CM | POA: Diagnosis present

## 2019-12-08 DIAGNOSIS — D509 Iron deficiency anemia, unspecified: Secondary | ICD-10-CM | POA: Diagnosis present

## 2019-12-08 DIAGNOSIS — I2722 Pulmonary hypertension due to left heart disease: Secondary | ICD-10-CM | POA: Diagnosis present

## 2019-12-08 DIAGNOSIS — Z9981 Dependence on supplemental oxygen: Secondary | ICD-10-CM | POA: Diagnosis not present

## 2019-12-08 DIAGNOSIS — Z888 Allergy status to other drugs, medicaments and biological substances status: Secondary | ICD-10-CM

## 2019-12-08 DIAGNOSIS — R0902 Hypoxemia: Secondary | ICD-10-CM | POA: Diagnosis not present

## 2019-12-08 DIAGNOSIS — R7989 Other specified abnormal findings of blood chemistry: Secondary | ICD-10-CM | POA: Diagnosis present

## 2019-12-08 DIAGNOSIS — I083 Combined rheumatic disorders of mitral, aortic and tricuspid valves: Secondary | ICD-10-CM | POA: Diagnosis not present

## 2019-12-08 DIAGNOSIS — F410 Panic disorder [episodic paroxysmal anxiety] without agoraphobia: Secondary | ICD-10-CM | POA: Diagnosis present

## 2019-12-08 DIAGNOSIS — F17210 Nicotine dependence, cigarettes, uncomplicated: Secondary | ICD-10-CM | POA: Diagnosis present

## 2019-12-08 DIAGNOSIS — R778 Other specified abnormalities of plasma proteins: Secondary | ICD-10-CM | POA: Diagnosis present

## 2019-12-08 DIAGNOSIS — R197 Diarrhea, unspecified: Secondary | ICD-10-CM | POA: Diagnosis present

## 2019-12-08 DIAGNOSIS — B9620 Unspecified Escherichia coli [E. coli] as the cause of diseases classified elsewhere: Secondary | ICD-10-CM | POA: Diagnosis present

## 2019-12-08 DIAGNOSIS — Z800 Family history of malignant neoplasm of digestive organs: Secondary | ICD-10-CM

## 2019-12-08 DIAGNOSIS — Z6828 Body mass index (BMI) 28.0-28.9, adult: Secondary | ICD-10-CM

## 2019-12-08 DIAGNOSIS — Z8541 Personal history of malignant neoplasm of cervix uteri: Secondary | ICD-10-CM

## 2019-12-08 DIAGNOSIS — Z952 Presence of prosthetic heart valve: Secondary | ICD-10-CM | POA: Diagnosis not present

## 2019-12-08 DIAGNOSIS — I5031 Acute diastolic (congestive) heart failure: Secondary | ICD-10-CM | POA: Diagnosis not present

## 2019-12-08 DIAGNOSIS — R0602 Shortness of breath: Secondary | ICD-10-CM

## 2019-12-08 DIAGNOSIS — E119 Type 2 diabetes mellitus without complications: Secondary | ICD-10-CM | POA: Diagnosis not present

## 2019-12-08 DIAGNOSIS — J181 Lobar pneumonia, unspecified organism: Secondary | ICD-10-CM | POA: Diagnosis not present

## 2019-12-08 DIAGNOSIS — Z885 Allergy status to narcotic agent status: Secondary | ICD-10-CM

## 2019-12-08 DIAGNOSIS — I050 Rheumatic mitral stenosis: Secondary | ICD-10-CM | POA: Diagnosis present

## 2019-12-08 DIAGNOSIS — I509 Heart failure, unspecified: Secondary | ICD-10-CM

## 2019-12-08 DIAGNOSIS — F32A Depression, unspecified: Secondary | ICD-10-CM

## 2019-12-08 DIAGNOSIS — Z20822 Contact with and (suspected) exposure to covid-19: Secondary | ICD-10-CM | POA: Diagnosis not present

## 2019-12-08 DIAGNOSIS — D539 Nutritional anemia, unspecified: Secondary | ICD-10-CM | POA: Diagnosis present

## 2019-12-08 DIAGNOSIS — N289 Disorder of kidney and ureter, unspecified: Secondary | ICD-10-CM | POA: Diagnosis not present

## 2019-12-08 DIAGNOSIS — R079 Chest pain, unspecified: Secondary | ICD-10-CM | POA: Diagnosis not present

## 2019-12-08 DIAGNOSIS — J810 Acute pulmonary edema: Secondary | ICD-10-CM | POA: Diagnosis not present

## 2019-12-08 DIAGNOSIS — I10 Essential (primary) hypertension: Secondary | ICD-10-CM | POA: Diagnosis not present

## 2019-12-08 DIAGNOSIS — J811 Chronic pulmonary edema: Secondary | ICD-10-CM | POA: Diagnosis not present

## 2019-12-08 DIAGNOSIS — T502X5A Adverse effect of carbonic-anhydrase inhibitors, benzothiadiazides and other diuretics, initial encounter: Secondary | ICD-10-CM | POA: Diagnosis not present

## 2019-12-08 DIAGNOSIS — Z79899 Other long term (current) drug therapy: Secondary | ICD-10-CM

## 2019-12-08 DIAGNOSIS — I517 Cardiomegaly: Secondary | ICD-10-CM | POA: Diagnosis not present

## 2019-12-08 DIAGNOSIS — F329 Major depressive disorder, single episode, unspecified: Secondary | ICD-10-CM | POA: Diagnosis present

## 2019-12-08 DIAGNOSIS — I2723 Pulmonary hypertension due to lung diseases and hypoxia: Secondary | ICD-10-CM | POA: Diagnosis present

## 2019-12-08 DIAGNOSIS — N390 Urinary tract infection, site not specified: Secondary | ICD-10-CM | POA: Diagnosis not present

## 2019-12-08 DIAGNOSIS — Z882 Allergy status to sulfonamides status: Secondary | ICD-10-CM

## 2019-12-08 DIAGNOSIS — Z886 Allergy status to analgesic agent status: Secondary | ICD-10-CM

## 2019-12-08 DIAGNOSIS — E669 Obesity, unspecified: Secondary | ICD-10-CM | POA: Diagnosis present

## 2019-12-08 DIAGNOSIS — Z720 Tobacco use: Secondary | ICD-10-CM | POA: Diagnosis present

## 2019-12-08 DIAGNOSIS — I110 Hypertensive heart disease with heart failure: Secondary | ICD-10-CM | POA: Diagnosis not present

## 2019-12-08 DIAGNOSIS — I5033 Acute on chronic diastolic (congestive) heart failure: Secondary | ICD-10-CM | POA: Diagnosis present

## 2019-12-08 DIAGNOSIS — Z91041 Radiographic dye allergy status: Secondary | ICD-10-CM

## 2019-12-08 DIAGNOSIS — Z823 Family history of stroke: Secondary | ICD-10-CM

## 2019-12-08 DIAGNOSIS — I350 Nonrheumatic aortic (valve) stenosis: Secondary | ICD-10-CM | POA: Diagnosis not present

## 2019-12-08 DIAGNOSIS — Z8249 Family history of ischemic heart disease and other diseases of the circulatory system: Secondary | ICD-10-CM

## 2019-12-08 DIAGNOSIS — B370 Candidal stomatitis: Secondary | ICD-10-CM | POA: Diagnosis present

## 2019-12-08 LAB — TROPONIN I (HIGH SENSITIVITY)
Troponin I (High Sensitivity): 16 ng/L (ref <18)
Troponin I (High Sensitivity): 60 ng/L — ABNORMAL HIGH (ref <18)
Troponin I (High Sensitivity): 241 ng/L (ref <18)

## 2019-12-08 LAB — BASIC METABOLIC PANEL
Anion gap: 12 (ref 5–15)
BUN: 9 mg/dL (ref 6–20)
CO2: 23 mmol/L (ref 22–32)
Calcium: 8.7 mg/dL — ABNORMAL LOW (ref 8.9–10.3)
Chloride: 100 mmol/L (ref 98–111)
Creatinine, Ser: 1.02 mg/dL — ABNORMAL HIGH (ref 0.44–1.00)
GFR calc Af Amer: >60 mL/min (ref >60)
GFR calc non Af Amer: >60 mL/min (ref >60)
Glucose, Bld: 285 mg/dL — ABNORMAL HIGH (ref 70–99)
Potassium: 3.5 mmol/L (ref 3.5–5.1)
Sodium: 135 mmol/L (ref 135–145)

## 2019-12-08 LAB — URINALYSIS, ROUTINE W REFLEX MICROSCOPIC
Bilirubin Urine: NEGATIVE
Glucose, UA: 150 mg/dL — AB
Ketones, ur: NEGATIVE mg/dL
Nitrite: POSITIVE — AB
Protein, ur: NEGATIVE mg/dL
Specific Gravity, Urine: 1.009 (ref 1.005–1.030)
pH: 6 (ref 5.0–8.0)

## 2019-12-08 LAB — TSH: TSH: 0.654 u[IU]/mL (ref 0.350–4.500)

## 2019-12-08 LAB — PROTIME-INR
INR: 1.4 — ABNORMAL HIGH (ref 0.8–1.2)
Prothrombin Time: 16.7 seconds — ABNORMAL HIGH (ref 11.4–15.2)

## 2019-12-08 LAB — BRAIN NATRIURETIC PEPTIDE: B Natriuretic Peptide: 319.5 pg/mL — ABNORMAL HIGH (ref 0.0–100.0)

## 2019-12-08 LAB — GLUCOSE, CAPILLARY
Glucose-Capillary: 261 mg/dL — ABNORMAL HIGH (ref 70–99)
Glucose-Capillary: 130 mg/dL — ABNORMAL HIGH (ref 70–99)

## 2019-12-08 LAB — I-STAT ARTERIAL BLOOD GAS, ED
Acid-Base Excess: 0 mmol/L (ref 0.0–2.0)
Bicarbonate: 23.3 mmol/L (ref 20.0–28.0)
Calcium, Ion: 1.13 mmol/L — ABNORMAL LOW (ref 1.15–1.40)
HCT: 27 % — ABNORMAL LOW (ref 36.0–46.0)
Hemoglobin: 9.2 g/dL — ABNORMAL LOW (ref 12.0–15.0)
O2 Saturation: 91 %
Patient temperature: 99.5
Potassium: 3.6 mmol/L (ref 3.5–5.1)
Sodium: 139 mmol/L (ref 135–145)
TCO2: 24 mmol/L (ref 22–32)
pCO2 arterial: 30.9 mmHg — ABNORMAL LOW (ref 32.0–48.0)
pH, Arterial: 7.487 — ABNORMAL HIGH (ref 7.350–7.450)
pO2, Arterial: 57 mmHg — ABNORMAL LOW (ref 83.0–108.0)

## 2019-12-08 LAB — FOLATE: Folate: 7.9 ng/mL (ref >5.9)

## 2019-12-08 LAB — LACTIC ACID, PLASMA
Lactic Acid, Venous: 2.8 mmol/L (ref 0.5–1.9)
Lactic Acid, Venous: 1.7 mmol/L (ref 0.5–1.9)

## 2019-12-08 LAB — CBC
HCT: 29 % — ABNORMAL LOW (ref 36.0–46.0)
Hemoglobin: 9 g/dL — ABNORMAL LOW (ref 12.0–15.0)
MCH: 31.3 pg (ref 26.0–34.0)
MCHC: 31 g/dL (ref 30.0–36.0)
MCV: 100.7 fL — ABNORMAL HIGH (ref 80.0–100.0)
Platelets: 266 10*3/uL (ref 150–400)
RBC: 2.88 MIL/uL — ABNORMAL LOW (ref 3.87–5.11)
RDW: 14.7 % (ref 11.5–15.5)
WBC: 13.3 10*3/uL — ABNORMAL HIGH (ref 4.0–10.5)
nRBC: 0 % (ref 0.0–0.2)

## 2019-12-08 LAB — HEMOGLOBIN A1C
Hgb A1c MFr Bld: 6.5 % — ABNORMAL HIGH (ref 4.8–5.6)
Mean Plasma Glucose: 139.85 mg/dL

## 2019-12-08 LAB — SARS CORONAVIRUS 2 BY RT PCR (HOSPITAL ORDER, PERFORMED IN ~~LOC~~ HOSPITAL LAB): SARS Coronavirus 2: NEGATIVE

## 2019-12-08 LAB — APTT: aPTT: 48 seconds — ABNORMAL HIGH (ref 24–36)

## 2019-12-08 LAB — PROCALCITONIN: Procalcitonin: 0.21 ng/mL

## 2019-12-08 LAB — MAGNESIUM: Magnesium: 1.4 mg/dL — ABNORMAL LOW (ref 1.7–2.4)

## 2019-12-08 LAB — VITAMIN B12: Vitamin B-12: 330 pg/mL (ref 180–914)

## 2019-12-08 MED ORDER — NICOTINE 14 MG/24HR TD PT24
14.0000 mg | MEDICATED_PATCH | Freq: Every day | TRANSDERMAL | Status: DC
  Administered 2019-12-08 – 2019-12-09 (×2): 14 mg via TRANSDERMAL
  Filled 2019-12-08 (×2): qty 1

## 2019-12-08 MED ORDER — PANTOPRAZOLE SODIUM 40 MG PO TBEC
40.0000 mg | DELAYED_RELEASE_TABLET | Freq: Every day | ORAL | Status: DC
  Administered 2019-12-09 – 2019-12-12 (×4): 40 mg via ORAL
  Filled 2019-12-08 (×5): qty 1

## 2019-12-08 MED ORDER — IPRATROPIUM-ALBUTEROL 0.5-2.5 (3) MG/3ML IN SOLN: 3.0000 mL | RESPIRATORY_TRACT | Status: DC | PRN

## 2019-12-08 MED ORDER — INSULIN ASPART 100 UNIT/ML ~~LOC~~ SOLN
0.0000 [IU] | Freq: Three times a day (TID) | SUBCUTANEOUS | Status: DC
  Administered 2019-12-08: 8 [IU] via SUBCUTANEOUS
  Administered 2019-12-09 – 2019-12-12 (×5): 2 [IU] via SUBCUTANEOUS

## 2019-12-08 MED ORDER — SODIUM CHLORIDE 0.9 % IV SOLN
1.0000 g | INTRAVENOUS | Status: DC
  Administered 2019-12-08: 1 g via INTRAVENOUS
  Filled 2019-12-08: qty 10

## 2019-12-08 MED ORDER — FUROSEMIDE 10 MG/ML IJ SOLN
40.0000 mg | Freq: Once | INTRAMUSCULAR | Status: AC
  Administered 2019-12-08: 40 mg via INTRAVENOUS
  Filled 2019-12-08: qty 4

## 2019-12-08 MED ORDER — FUROSEMIDE 10 MG/ML IJ SOLN
40.0000 mg | Freq: Two times a day (BID) | INTRAMUSCULAR | Status: DC
  Administered 2019-12-08 – 2019-12-09 (×3): 40 mg via INTRAVENOUS
  Filled 2019-12-08 (×3): qty 4

## 2019-12-08 MED ORDER — SODIUM CHLORIDE 0.9 % IV SOLN: 250.0000 mL | INTRAVENOUS | Status: DC | PRN

## 2019-12-08 MED ORDER — WARFARIN SODIUM 10 MG PO TABS
10.0000 mg | ORAL_TABLET | Freq: Once | ORAL | Status: AC
  Administered 2019-12-08: 10 mg via ORAL
  Filled 2019-12-08 (×2): qty 1

## 2019-12-08 MED ORDER — NYSTATIN 100000 UNIT/ML MT SUSP
5.0000 mL | Freq: Four times a day (QID) | OROMUCOSAL | Status: DC
  Administered 2019-12-08 – 2019-12-12 (×14): 500000 [IU] via ORAL
  Filled 2019-12-08 (×17): qty 5

## 2019-12-08 MED ORDER — INSULIN ASPART 100 UNIT/ML ~~LOC~~ SOLN
0.0000 [IU] | Freq: Every day | SUBCUTANEOUS | Status: DC
  Administered 2019-12-10: 2 [IU] via SUBCUTANEOUS

## 2019-12-08 MED ORDER — ENOXAPARIN SODIUM 80 MG/0.8ML ~~LOC~~ SOLN
75.0000 mg | Freq: Two times a day (BID) | SUBCUTANEOUS | Status: DC
  Administered 2019-12-08: 75 mg via SUBCUTANEOUS
  Filled 2019-12-08: qty 0.75
  Filled 2019-12-08: qty 0.8

## 2019-12-08 MED ORDER — WARFARIN - PHARMACIST DOSING INPATIENT: Freq: Every day | Status: DC

## 2019-12-08 MED ORDER — ALPRAZOLAM 0.5 MG PO TABS
0.5000 mg | ORAL_TABLET | Freq: Three times a day (TID) | ORAL | Status: DC
  Administered 2019-12-08 – 2019-12-12 (×12): 0.5 mg via ORAL
  Filled 2019-12-08 (×12): qty 1

## 2019-12-08 MED ORDER — ACETAMINOPHEN 325 MG PO TABS: 650.0000 mg | ORAL_TABLET | ORAL | Status: DC | PRN

## 2019-12-08 MED ORDER — ROSUVASTATIN CALCIUM 5 MG PO TABS
10.0000 mg | ORAL_TABLET | Freq: Every day | ORAL | Status: DC
  Administered 2019-12-09 – 2019-12-12 (×4): 10 mg via ORAL
  Filled 2019-12-08 (×5): qty 2

## 2019-12-08 MED ORDER — FLUOXETINE HCL 20 MG PO CAPS
60.0000 mg | ORAL_CAPSULE | Freq: Every day | ORAL | Status: DC
  Administered 2019-12-08 – 2019-12-12 (×5): 60 mg via ORAL
  Filled 2019-12-08 (×5): qty 3

## 2019-12-08 MED ORDER — ONDANSETRON HCL 4 MG/2ML IJ SOLN
4.0000 mg | Freq: Once | INTRAMUSCULAR | Status: AC
  Administered 2019-12-08: 4 mg via INTRAVENOUS
  Filled 2019-12-08: qty 2

## 2019-12-08 MED ORDER — ENOXAPARIN SODIUM 100 MG/ML ~~LOC~~ SOLN
82.5000 mg | Freq: Two times a day (BID) | SUBCUTANEOUS | Status: DC
  Administered 2019-12-09: 82.5 mg via SUBCUTANEOUS
  Filled 2019-12-08: qty 1

## 2019-12-08 MED ORDER — TRAZODONE HCL 100 MG PO TABS
100.0000 mg | ORAL_TABLET | Freq: Every day | ORAL | Status: DC
  Administered 2019-12-08 – 2019-12-11 (×4): 100 mg via ORAL
  Filled 2019-12-08 (×4): qty 1

## 2019-12-08 MED ORDER — SODIUM CHLORIDE 0.9% FLUSH
3.0000 mL | Freq: Two times a day (BID) | INTRAVENOUS | Status: DC
  Administered 2019-12-08 – 2019-12-12 (×6): 3 mL via INTRAVENOUS

## 2019-12-08 MED ORDER — SODIUM CHLORIDE 0.9% FLUSH
3.0000 mL | Freq: Once | INTRAVENOUS | Status: AC
  Administered 2019-12-08: 3 mL via INTRAVENOUS

## 2019-12-08 MED ORDER — DM-GUAIFENESIN ER 30-600 MG PO TB12
1.0000 | ORAL_TABLET | Freq: Two times a day (BID) | ORAL | Status: DC
  Administered 2019-12-08 – 2019-12-12 (×9): 1 via ORAL
  Filled 2019-12-08 (×12): qty 1

## 2019-12-08 MED ORDER — METOPROLOL TARTRATE 50 MG PO TABS
50.0000 mg | ORAL_TABLET | Freq: Two times a day (BID) | ORAL | Status: DC
  Administered 2019-12-08 – 2019-12-12 (×5): 50 mg via ORAL
  Filled 2019-12-08 (×8): qty 1

## 2019-12-08 MED ORDER — SODIUM CHLORIDE 0.9% FLUSH: 3.0000 mL | INTRAVENOUS | Status: DC | PRN

## 2019-12-08 NOTE — ED Notes (Signed)
Placed a external cath and a brief on patient patient is resting with family at bedside and call bell in reach

## 2019-12-08 NOTE — H&P (Addendum)
History and Physical    Holly Figueroa XBM:[id number] DOB: 1965/10/05 DOA: 12/08/2019  PCP: Glenda Chroman, MD  Patient coming from: Home  I have personally briefly reviewed patient's old medical records in [place]  Chief Complaint: Shortness of breath, hypoxia, worsening cough and congestion since 2 weeks  HPI: Holly Figueroa is a 54 y.o. female with medical history significant of 1, hyperlipidemia, diabetes mellitus, depression/anxiety, tobacco abuse, MVR on Coumadin therapy, GERD, obesity presents to emergency department due to worsening shortness of breath, cough, congestion and hypoxia.  Patient tells me that her symptoms started 2 weeks ago.  She was seen by her PCP and doxycycline was prescribed for 10 days which she finished  4 days ago and she was doing better however since yesterday she started feeling terrible.  Reports worsening shortness of breath, chest congestion, productive cough with yellow sputum.  She tells me that she uses 3 L of oxygen at nighttime due to obstructive sleep apnea however since couple of days she has been using 5 L at home. She didn't take her Lasix yesterday as she was sleepy.  She reports generalized weakness, lethargy, midsternal chest pain, orthopnea, nausea, vomiting and nonbloody diarrhea and thrush since 2 days.  She tells me that she has been using 8 pillows at nighttime.  Reports dark-colored urine however denies dysuria, back pain, change in urinary frequency.  Had regular bowel movement this morning.  No history of leg swelling or palpitation.  No fever, chills, abdominal pain, decreased appetite.  She has been sleepy more than usual at home due to weakness.  She continues to smoke half pack of cigarettes per day, drinks alcohol occasionally.  No history of illicit drug use.  She lives with her husband at home.  She is compliant with her Coumadin therapy 5 mg once daily at bedtime.  ED Course: Upon arrival to ED: Patient tachycardic,  tachypneic, oxygen saturation 91%, placed on 6 L of oxygen via nasal cannula.  Afebrile with leukocytosis of 13,000, CBC shows macrocytic anemia with H&H of 9.0/29.0 MCV: 100.7, BNP: 319, troponin trended up from 16-16.  COVID-19 pending.  Chest x-ray shows diffuse pulmonary edema.  Patient received Lasix 40 IV once in ED.  [facility] hospitalist consulted for admission for likely fluid overload.  Review of Systems: As per HPI otherwise negative.    Past Medical History:  Diagnosis Date  . Acute respiratory failure ([place])   . Anemia 08-2008   Blood transfusion  . Anxiety   . Benzodiazepine dependence ([place])   . Cervical cancer ([place])   . CHF (congestive heart failure) ([place])   . Depression   . Diabetes mellitus without complication ([place])   . Headache(784.0)    migraines  . Hypokalemia   . Legionella pneumonia ([place])   . Leukocytosis, unspecified   . Mitral stenosis   . Panic attacks   . Tobacco abuse     Past Surgical History:  Procedure Laterality Date  . BIOPSY  06/12/2017   Procedure: BIOPSY;  Surgeon: Daneil Dolin, MD;  Location: [facility];  Service: Gastroenterology;;  esophagus  . cardiac valve repair  2011   stretched mitral valve  . CERVICAL CONIZATION W/BX  2000  . CHOLECYSTECTOMY  06/26/2012   Procedure: LAPAROSCOPIC CHOLECYSTECTOMY WITH INTRAOPERATIVE CHOLANGIOGRAM;  Surgeon: Ralene Ok, MD;  Location: [facility];  Service: General;  Laterality: N/A;  . COLONOSCOPY WITH PROPOFOL N/A 06/12/2017   Normal colon, normal TI, Grade I internal hemorrhoids  .  ESOPHAGOGASTRODUODENOSCOPY (EGD) WITH PROPOFOL N/A 06/12/2017   esophagitis, possibly chemical or pill-induced, s/p dilation. small hiatal hernia, normal duodenal bulb and D 2  . GIVENS CAPSULE STUDY N/A 07/19/2017   incomplete capsule study as did not reach cecum.   Marland Kitchen LASER ABLATION OF THE CERVIX    . MALONEY DILATION  06/12/2017   Procedure: MALONEY DILATION;  Surgeon: Daneil Dolin, MD;  Location: [facility];   Service: Gastroenterology;;  . MOLE REMOVAL    . NASAL SINUS SURGERY       reports that she has been smoking cigarettes. She has a 23.00 pack-year smoking history. She has never used smokeless tobacco. She reports that she does not drink alcohol and does not use drugs.  Allergies  Allergen Reactions  . Iodinated Diagnostic Agents Anaphylaxis  . Aspirin Other (See Comments)    "Possible blood in stool" the 323m. Can take 853m  . Ibuprofen Other (See Comments)    "Possible blood in stool"  . Sulfa Antibiotics Hives  . Zofran [Ondansetron Hcl]     Bad headaches  . Dilaudid [Hydromorphone Hcl] Itching and Palpitations  . Hydrocodone Palpitations    Family History  Problem Relation Age of Onset  . Heart disease Father   . COPD Father   . Heart failure Maternal Grandmother   . Stroke Mother   . Colon cancer Brother [id number]     deceased   . Colon polyps Neg Hx     Prior to Admission medications   Medication Sig Start Date End Date Taking? Authorizing Provider  acetaminophen (TYLENOL) 500 MG tablet Take 500 mg by mouth every 6 (six) hours as needed for headache.   Yes [provider]  ALPRAZolam (XDuanne Moron1 MG tablet Take 0.5 tablets (0.5 mg total) by mouth 3 (three) times daily. anxiety Patient taking differently: Take 1 mg by mouth in the morning, at noon, in the evening, and at bedtime. Anxiety 11/08/14  Yes Ollis, Brandi L, NP  FLUoxetine HCl 60 MG TABS Take 60 mg by mouth daily.    Yes [provider]  furosemide (LASIX) 20 MG tablet Take 1 tablet (20 mg total) by mouth daily. Patient taking differently: Take 80 mg by mouth See admin instructions. Pt takes 40  mg 2 tabs in the morning and 40 mg 2 tabs in the evening 03/30/19  Yes MaBarton DuboisMD  glipiZIDE (GLUCOTROL XL) 5 MG 24 hr tablet Take 5 mg by mouth 2 (two) times daily. 10/20/19  Yes [provider]  metFORMIN (GLUCOPHAGE) 500 MG tablet Take 1 tablet (500 mg total) by mouth 2 (two) times daily  with a meal. 02/08/17  Yes RaPattricia BossMD  metoprolol tartrate (LOPRESSOR) 50 MG tablet Take 1 tablet (50 mg total) by mouth 2 (two) times daily. 09/18/17  Yes JoDomenic PoliteMD  pantoprazole (PROTONIX) 40 MG tablet Take 40 mg by mouth daily after breakfast.    Yes [provider]  rosuvastatin (CRESTOR) 10 MG tablet Take 10 mg by mouth daily after breakfast.  04/15/17  Yes [provider]  traZODone (DESYREL) 100 MG tablet Take 100 mg by mouth at bedtime.    Yes [provider]  warfarin (COUMADIN) 5 MG tablet Take 5 mg by mouth at bedtime.  12/26/18  Yes [provider]  aspirin EC 81 MG tablet Take 1 tablet (81 mg total) by mouth daily after breakfast. Patient not taking: Reported on 12/08/2019 03/29/19   MaBarton DuboisMD  Cholecalciferol (  VITAMIN D-3) 125 MCG (5000 UT) TABS Take 5,000 Units by mouth daily after breakfast. Patient not taking: Reported on 12/08/2019 03/29/19   Barton Dubois, MD  fluticasone [place]) 50 MCG/ACT nasal spray Place 1 spray into both nostrils daily. Patient not taking: Reported on 12/08/2019 03/30/19   Barton Dubois, MD  loratadine (CLARITIN) 10 MG tablet Take 1 tablet (10 mg total) by mouth daily. Patient not taking: Reported on 12/08/2019 03/30/19   Barton Dubois, MD  potassium chloride SA (KLOR-CON) 20 MEQ tablet Take 1 tablet (20 mEq total) by mouth daily. Patient not taking: Reported on 12/08/2019 03/30/19   Barton Dubois, MD  umeclidinium-vilanterol [facility] ELLIPTA) 62.5-25 MCG/INH AEPB Inhale 1 puff into the lungs daily. Patient not taking: Reported on 12/08/2019 03/30/19   Barton Dubois, MD  vitamin B-12 1000 MCG tablet Take 1 tablet (1,000 mcg total) by mouth daily. Patient not taking: Reported on 12/08/2019 03/30/19   Barton Dubois, MD    Physical Exam: Vitals:   12/08/19 1321 12/08/19 1322 12/08/19 1323 12/08/19 1504  BP:    129/66  Pulse: (!) 111 (!) 112 (!) 115 (!) 107  Resp: (!) 26 (!) 22 (!) 36 (!) 21  Temp:        TempSrc:      SpO2: 95% 95% 94% 96%  Weight:      Height:        Constitutional: NAD, calm, comfortable, communicating well, obese, on 6 L of oxygen via nasal cannula. Eyes: PERRL, lids and conjunctivae normal ENMT: Mucous membranes are moist. Posterior pharynx clear of any exudate or lesions.Normal dentition.  Neck: normal, supple, no masses, no thyromegaly Respiratory: Bilateral crackles on the bases.  No wheezing Cardiovascular: Tachycardia, no murmurs / rubs / gallops. No extremity edema. 2+ pedal pulses. No carotid bruits.  Abdomen: no tenderness, no masses palpated. No hepatosplenomegaly. Bowel sounds positive.  Musculoskeletal: no clubbing / cyanosis. No joint deformity upper and lower extremities. Good ROM, no contractures. Normal muscle tone.  Skin: no rashes, lesions, ulcers. No induration Neurologic: CN 2-12 grossly intact. Sensation intact, DTR normal. Strength 5/5 in all 4.  Psychiatric: Normal judgment and insight. Alert and oriented x 3. Normal mood.    Labs on Admission: I have personally reviewed following labs and imaging studies  CBC: Recent Labs  Lab 12/08/19 0958 12/08/19 1249  WBC 13.3*  --   HGB 9.0* 9.2*  HCT 29.0* 27.0*  MCV 100.7*  --   PLT 266  --    Basic Metabolic Panel: Recent Labs  Lab 12/08/19 0958 12/08/19 1249  NA 135 139  K 3.5 3.6  CL 100  --   CO2 23  --   GLUCOSE 285*  --   BUN 9  --   CREATININE 1.02*  --   CALCIUM 8.7*  --    GFR: Estimated Creatinine Clearance: 66.7 mL/min (A) (by C-G formula based on SCr of 1.02 mg/dL (H)). Liver Function Tests: No results for input(s): AST, ALT, ALKPHOS, BILITOT, PROT, ALBUMIN in the last 168 hours. No results for input(s): LIPASE, AMYLASE in the last 168 hours. No results for input(s): AMMONIA in the last 168 hours. Coagulation Profile: No results for input(s): INR, PROTIME in the last 168 hours. Cardiac Enzymes: No results for input(s): CKTOTAL, CKMB, CKMBINDEX, TROPONINI in the  last 168 hours. BNP (last 3 results) No results for input(s): PROBNP in the last 8760 hours. HbA1C: No results for input(s): HGBA1C in the last 72 hours. CBG: No results for input(s):  GLUCAP in the last 168 hours. Lipid Profile: No results for input(s): CHOL, HDL, LDLCALC, TRIG, CHOLHDL, LDLDIRECT in the last 72 hours. Thyroid Function Tests: No results for input(s): TSH, T4TOTAL, FREET4, T3FREE, THYROIDAB in the last 72 hours. Anemia Panel: No results for input(s): VITAMINB12, FOLATE, FERRITIN, TIBC, IRON, RETICCTPCT in the last 72 hours. Urine analysis:    Component Value Date/Time   COLORURINE YELLOW 03/27/2019 2209   APPEARANCEUR CLEAR 03/27/2019 2209   LABSPEC 1.005 03/27/2019 2209   PHURINE 6.0 03/27/2019 2209   GLUCOSEU NEGATIVE 03/27/2019 2209   HGBUR NEGATIVE 03/27/2019 2209   BILIRUBINUR NEGATIVE 03/27/2019 2209   KETONESUR NEGATIVE 03/27/2019 2209   PROTEINUR NEGATIVE 03/27/2019 2209   UROBILINOGEN 0.2 10/28/2014 2330   NITRITE NEGATIVE 03/27/2019 2209   LEUKOCYTESUR SMALL (A) 03/27/2019 2209    Radiological Exams on Admission: DG Chest 2 View  Result Date: 12/08/2019 CLINICAL DATA:  Chest pain and shortness of breath for 2 days. Hypoxia. EXAM: CHEST - 2 VIEW COMPARISON:  03/27/2019 FINDINGS: Stable mild cardiomegaly. 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 Diffuse interstitial and airspace disease shows mild increase since previous study, most likely due to diffuse pulmonary edema. No evidence of pleural effusion. IMPRESSION: Mild increase in diffuse interstitial and airspace disease, likely due to diffuse pulmonary edema. Electronically Signed   By: Marlaine Hind M.D.   On: 12/08/2019 10:24    EKG: Independently reviewed. Sinus tachycardia, left posterior fascicular block. Nonspecific T wave changes.  Assessment/Plan Principal Problem:   Acute on chronic respiratory failure with hypoxia (HCC) Active Problems:   Tobacco abuse   Macrocytic anemia    Gastroesophageal reflux disease   Chronic obstructive pulmonary disease (HCC)   CHF exacerbation (HCC)   Diabetes mellitus without complication (HCC)   Mitral stenosis   Elevated troponin   Thrush   Depression   Anxiety    Acute on chronic respiratory failure with hypoxia: -Could be secondary to acute on chronic diastolic CHF with underlying COPD -Patient presented with worsening shortness of breath, cough, congestion, hypoxia and orthopnea.  Currently she is maintaining oxygen saturation in 94-95% on 6 L of oxygen.  Patient is afebrile with leukocytosis of 13.3. Doxy for 10 days outpatient. -Received Lasix 40 mg IV once in ED.  Chest x-ray  shows mild increase in diffuse interstitial and airspace disease likely due to diffuse pulmonary edema.  BNP: 319 -Admit patient at stepdown unit for close monitoring.  On continuous pulse ox.  We will try to wean off of oxygen as tolerated. -Start on Lasix 40 IV twice daily.  Strict INO's and daily weight -Check electrolytes, procalcitonin, lactic acid, blood culture. -Reviewed echo from 10/20 which shows preserved ejection fraction. -We will repeat transthoracic echo. -Start on Mucinex, incentive spirometry.  DuoNeb as needed and Pulmicort twice daily  Hypertension: Stable -Continue metoprolol  Hyperlipidemia: Continue statin  GERD: Continue PPI  Diabetes mellitus: On Metformin and glipizide at home-hold for now.  Started patient on sliding scale insulin.  Check A1c  Macrocytic anemia: H&H is 9.0/29.0, MCV of 100.7 -H&H is stable. -Check TSH, B12 and folate  Depression/anxiety: -Continue Paxil, trazodone and Xanax  History of mitral stenosis s/p AVR/MVR: -Pharmacy consult for Coumadin -INR: 1.4-will bridge with lovenox  Obstructive sleep apnea: On oxygen at bedtime at home  Elevated troponin: Troponin trended up from 16-60 -Reviewed EKG.  Trend troponin.  Nitro as needed  Tobacco abuse: Counseled about cessation -Started on  nicotine patch as per patient's request  Thrush:  Likely secondary to recent antibiotic use -Nystatin 4 times daily  Vomiting/diarrhea: -Likely secondary to recent antibiotic (doxycycline )use -Had regular bowel movement this morning.  Continue Zofran as needed for nausea and vomiting -will not check c diff as she has regular BM.  UTI: -Patient reports dark-colored urine. UA positive for nitrites and leukocytes. Started on Rocephin. Urine culture is pending-de-escalate antibiotics based on urine culture result.  Please note: I talked to Dr. Marlou Porch regarding elevated troponin-he recommended  Repeat troponin tomorrow am & cardio will assess her tomorrow.  DVT prophylaxis: Coumadin/SCD Code Status: Full code Family Communication: *Patient's husband present at bedside.  Plan of care discussed with patient and her husband in length and they verbalized understanding and agreed with it. Disposition Plan: Likely home in 2 days Consults called: Cardiology  admission status: Inpatient   Mckinley Jewel MD [facility]  If 7PM-7AM, please contact night-coverage [email or web address] Password [facility]  12/08/2019, 3:14 PM

## 2019-12-08 NOTE — ED Notes (Signed)
Pt placed on 6.5L of O2.

## 2019-12-08 NOTE — Progress Notes (Addendum)
ANTICOAGULATION CONSULT NOTE - Initial Consult  Pharmacy Consult for warfarin Indication: mechanical MVR, AVR  Allergies  Allergen Reactions  . Iodinated Diagnostic Agents Anaphylaxis  . Aspirin Other (See Comments)    "Possible blood in stool" the 35m. Can take 856m  . Ibuprofen Other (See Comments)    "Possible blood in stool"  . Sulfa Antibiotics Hives  . Zofran [Ondansetron Hcl]     Bad headaches  . Dilaudid [Hydromorphone Hcl] Itching and Palpitations  . Hydrocodone Palpitations    Patient Measurements: Height: _0  (167.6 cm) Weight: 74.8 kg (165 lb) IBW/kg (Calculated) : 59.3 Heparin Dosing Weight: 74 kg  Vital Signs: Temp: 99.5 F (37.5 C) (06/15 0951) Temp Source: Oral (06/15 0951) BP: 131/102 (06/15 1317) Pulse Rate: 115 (06/15 1323)  Labs: Recent Labs    12/08/19 0958 12/08/19 1247 12/08/19 1249  HGB 9.0*  --  9.2*  HCT 29.0*  --  27.0*  PLT 266  --   --   CREATININE 1.02*  --   --   TROPONINIHS 16 60*  --     Estimated Creatinine Clearance: 66.7 mL/min (A) (by C-G formula based on SCr of 1.02 mg/dL (H)).   Medical History: Past Medical History:  Diagnosis Date  . Acute respiratory failure ([place]  . Anemia 08-2008   Blood transfusion  . Anxiety   . Benzodiazepine dependence ([facility]  . Cervical cancer ([place]  . CHF (congestive heart failure) ([place]  . Depression   . Diabetes mellitus without complication ([place]  . Headache(784.0)    migraines  . Hypokalemia   . Legionella pneumonia ([facility]  . Leukocytosis, unspecified   . Mitral stenosis   . Panic attacks   . Tobacco abuse     Medications:  Scheduled:  . ALPRAZolam  0.5 mg Oral TID  . FLUoxetine  60 mg Oral Daily  . furosemide  40 mg Intravenous BID  . insulin aspart  0-15 Units Subcutaneous TID WC  . insulin aspart  0-5 Units Subcutaneous QHS  . metoprolol tartrate  50 mg Oral BID  . [START ON 12/09/2019] pantoprazole  40 mg Oral QPC breakfast  . [START ON 12/09/2019]  rosuvastatin  10 mg Oral QPC breakfast  . sodium chloride flush  3 mL Intravenous Q12H  . traZODone  100 mg Oral QHS    Assessment: 5257of with AVR, mechanical MVR on warfarin PTA. PTA dose 5 mg daily per CPhT med history. Last dose 6/13. INR today subtherapeutic at 1.4. Confirmed with MD plan to bridge with enoxaparin. Baseline hgb 9.2, plts 266. Current diet is NPO.   Goal of Therapy:  INR 2.5-3.5 Monitor platelets by anticoagulation protocol: Yes   Plan:  Enoxaparin 75 mg (1 mg/kg) [place] Q12h until therapeutic INR  Warfarin 10 mg PO x1 tonight  Daily INR, CBC Monitor for bleeding    Thank you,   AnEddie CandlePharmD PGY-1 Pharmacy Resident   Please check amion for clinical pharmacist contact number 12/08/2019,3:08 PM

## 2019-12-08 NOTE — Discharge Instructions (Addendum)

## 2019-12-08 NOTE — Progress Notes (Signed)
   CRITICAL VALUE ALERT  Critical Value: Trop I- 241  Date & Time Notied:  12/08/19 @ 8867  Provider Notified: Dr. Doristine Bosworth  Orders Received/Actions taken: awaiting orders

## 2019-12-08 NOTE — Plan of Care (Signed)

## 2019-12-08 NOTE — ED Provider Notes (Signed)
[facility] EMERGENCY DEPARTMENT Provider Note   CSN: [id number] Arrival date & time: 12/08/19  0944     History Chief Complaint  Patient presents with  . Shortness of Breath    Holly Figueroa is a 54 y.o. female.  54 y.o female with a PMH of Acute respiratory failure, Mitral stenosis, CHD presents to the ED with a chief complaint of shortness of breath x 2 weeks.  Patient reports being evaluated by her PCP about 2 weeks ago in [place], states that she was placed on antibiotics, completed a 10-day course but symptoms did not improve.  She reports shortness of breath worsened yesterday, states she is currently on 3 L of O2 at night only, states that she had to use oxygen throughout the day.  Has now increased her O2 requirement to 7.  She is currently taking 80 mg of Lasix daily, states she did not take them yesterday as she slept most of the day.  Endorses a productive cough, with yellow to green sputum.  Endorses orthopnea, sleeping with 8 pillows under her head.  Does report subjective chills.  Of note, patient does smoke a half a pack daily.No chest pain, abdominal pain, or other complaints.    The history is provided by the patient.       Past Medical History:  Diagnosis Date  . Acute respiratory failure ([place])   . Anemia 08-2008   Blood transfusion  . Anxiety   . Benzodiazepine dependence ([place])   . Cervical cancer ([place])   . CHF (congestive heart failure) ([place])   . Depression   . Diabetes mellitus without complication ([place])   . Headache(784.0)    migraines  . Hypokalemia   . Legionella pneumonia ([place])   . Leukocytosis, unspecified   . Mitral stenosis   . Panic attacks   . Tobacco abuse     Patient Active Problem List   Diagnosis Date Noted  . Gastroesophageal reflux disease   . Chronic obstructive pulmonary disease ([place])   . Acute on chronic respiratory failure with hypoxia ([place]) 03/27/2019  . Shock ([place])   . Pressure injury of skin  09/02/2017  . Sepsis ([place]) 08/24/2017  . HCAP (healthcare-associated pneumonia) 08/24/2017  . Rectal bleeding   . Syncope 08/19/2017  . Hematemesis 08/19/2017  . Iron deficiency anemia due to chronic blood loss   . Esophageal dysphagia   . Acute on chronic diastolic CHF (congestive heart failure) ([place]) 06/12/2017  . Hematochezia 06/12/2017  . Anemia 06/11/2017  . ARDS (adult respiratory distress syndrome) ([place]) 10/30/2014  . Bilateral pneumonia 10/29/2014  . CAP (community acquired pneumonia) 10/29/2014  . Hypokalemia   . Hypoxia   . UTI (urinary tract infection)   . Tobacco abuse 10/11/2010  . Acute respiratory failure ([place]) 10/11/2010    Past Surgical History:  Procedure Laterality Date  . BIOPSY  06/12/2017   Procedure: BIOPSY;  Surgeon: Daneil Dolin, MD;  Location: [facility];  Service: Gastroenterology;;  esophagus  . cardiac valve repair  2011   stretched mitral valve  . CERVICAL CONIZATION W/BX  2000  . CHOLECYSTECTOMY  06/26/2012   Procedure: LAPAROSCOPIC CHOLECYSTECTOMY WITH INTRAOPERATIVE CHOLANGIOGRAM;  Surgeon: Ralene Ok, MD;  Location: [facility] ORS;  Service: General;  Laterality: N/A;  . COLONOSCOPY WITH PROPOFOL N/A 06/12/2017   Normal colon, normal TI, Grade I internal hemorrhoids  . ESOPHAGOGASTRODUODENOSCOPY (EGD) WITH PROPOFOL N/A 06/12/2017   esophagitis, possibly chemical or pill-induced, s/p dilation. small hiatal hernia, normal duodenal  bulb and D 2  . GIVENS CAPSULE STUDY N/A 07/19/2017   incomplete capsule study as did not reach cecum.   Marland Kitchen LASER ABLATION OF THE CERVIX    . MALONEY DILATION  06/12/2017   Procedure: MALONEY DILATION;  Surgeon: Daneil Dolin, MD;  Location: [facility];  Service: Gastroenterology;;  . MOLE REMOVAL    . NASAL SINUS SURGERY       OB History   No obstetric history on file.     Family History  Problem Relation Age of Onset  . Heart disease Father   . COPD Father   . Heart failure Maternal Grandmother   .  Stroke Mother   . Colon cancer Brother 85       deceased   . Colon polyps Neg Hx     Social History   Tobacco Use  . Smoking status: Current Every Day Smoker    Packs/day: 1.00    Years: 23.00    Pack years: 23.00    Types: Cigarettes    Last attempt to quit: 05/06/2017    Years since quitting: 2.5  . Smokeless tobacco: Never Used  Vaping Use  . Vaping Use: Never used  Substance Use Topics  . Alcohol use: No    Alcohol/week: 0.0 standard drinks  . Drug use: No    Home Medications Prior to Admission medications   Medication Sig Start Date End Date Taking? Authorizing Provider  acetaminophen (TYLENOL) 500 MG tablet Take 500 mg by mouth every 6 (six) hours as needed for headache.   Yes [provider]  ALPRAZolam Duanne Moron) 1 MG tablet Take 0.5 tablets (0.5 mg total) by mouth 3 (three) times daily. anxiety Patient taking differently: Take 1 mg by mouth in the morning, at noon, in the evening, and at bedtime. Anxiety 11/08/14  Yes Ollis, Brandi L, NP  FLUoxetine HCl 60 MG TABS Take 60 mg by mouth daily.    Yes [provider]  furosemide (LASIX) 20 MG tablet Take 1 tablet (20 mg total) by mouth daily. Patient taking differently: Take 80 mg by mouth See admin instructions. Pt takes 40  mg 2 tabs in the morning and 40 mg 2 tabs in the evening 03/30/19  Yes Barton Dubois, MD  glipiZIDE (GLUCOTROL XL) 5 MG 24 hr tablet Take 5 mg by mouth 2 (two) times daily. 10/20/19  Yes [provider]  metFORMIN (GLUCOPHAGE) 500 MG tablet Take 1 tablet (500 mg total) by mouth 2 (two) times daily with a meal. 02/08/17  Yes Pattricia Boss, MD  metoprolol tartrate (LOPRESSOR) 50 MG tablet Take 1 tablet (50 mg total) by mouth 2 (two) times daily. 09/18/17  Yes Domenic Polite, MD  pantoprazole (PROTONIX) 40 MG tablet Take 40 mg by mouth daily after breakfast.    Yes [provider]  rosuvastatin (CRESTOR) 10 MG tablet Take 10 mg by mouth daily after breakfast.  04/15/17  Yes  [provider]  traZODone (DESYREL) 100 MG tablet Take 100 mg by mouth at bedtime.    Yes [provider]  warfarin (COUMADIN) 5 MG tablet Take 5 mg by mouth at bedtime.  12/26/18  Yes [provider]  aspirin EC 81 MG tablet Take 1 tablet (81 mg total) by mouth daily after breakfast. Patient not taking: Reported on 12/08/2019 03/29/19   Barton Dubois, MD  Cholecalciferol (VITAMIN D-3) 125 MCG (5000 UT) TABS Take 5,000 Units by mouth daily after breakfast. Patient not taking: Reported on 12/08/2019 03/29/19  Barton Dubois, MD  fluticasone [facility]) 50 MCG/ACT nasal spray Place 1 spray into both nostrils daily. Patient not taking: Reported on 12/08/2019 03/30/19   Barton Dubois, MD  loratadine (CLARITIN) 10 MG tablet Take 1 tablet (10 mg total) by mouth daily. Patient not taking: Reported on 12/08/2019 03/30/19   Barton Dubois, MD  potassium chloride SA (KLOR-CON) 20 MEQ tablet Take 1 tablet (20 mEq total) by mouth daily. Patient not taking: Reported on 12/08/2019 03/30/19   Barton Dubois, MD  umeclidinium-vilanterol [facility] ELLIPTA) 62.5-25 MCG/INH AEPB Inhale 1 puff into the lungs daily. Patient not taking: Reported on 12/08/2019 03/30/19   Barton Dubois, MD  vitamin B-12 1000 MCG tablet Take 1 tablet (1,000 mcg total) by mouth daily. Patient not taking: Reported on 12/08/2019 03/30/19   Barton Dubois, MD    Allergies    Iodinated diagnostic agents, Aspirin, Ibuprofen, Sulfa antibiotics, Zofran [ondansetron hcl], Dilaudid [hydromorphone hcl], and Hydrocodone  Review of Systems   Review of Systems  Constitutional: Positive for chills. Negative for fever.  HENT: Negative for sore throat.   Respiratory: Positive for shortness of breath.   Cardiovascular: Positive for chest pain.  Gastrointestinal: Negative for abdominal pain, nausea and vomiting.  Genitourinary: Negative for flank pain.  Musculoskeletal: Negative for back pain.  Neurological: Negative for  light-headedness and headaches.  All other systems reviewed and are negative.   Physical Exam Updated Vital Signs BP (!) 131/102   Pulse (!) 115   Temp 99.5 F (37.5 C) (Oral)   Resp (!) 36   Ht _0  (1.676 m)   Wt 74.8 kg   SpO2 94%   BMI 26.63 kg/m   Physical Exam Vitals and nursing note reviewed.  Constitutional:      Appearance: She is well-developed. She is ill-appearing.     Comments: Appears uncomfortable, actively vomiting.   HENT:     Head: Normocephalic and atraumatic.  Cardiovascular:     Rate and Rhythm: Tachycardia present.  Pulmonary:     Effort: Tachypnea present.     Breath sounds: Decreased breath sounds and rales present. No wheezing or rhonchi.  Chest:     Chest wall: No tenderness.  Abdominal:     Palpations: Abdomen is soft.  Musculoskeletal:     Cervical back: Normal range of motion.     Right lower leg: No tenderness. No edema.     Left lower leg: No tenderness. No edema.     Comments: No calf tenderness, no no pitting edema.   Skin:    General: Skin is warm and dry.  Neurological:     Mental Status: She is alert and oriented to person, place, and time.     ED Results / Procedures / Treatments   Labs (all labs ordered are listed, but only abnormal results are displayed) Labs Reviewed  BASIC METABOLIC PANEL - Abnormal; Notable for the following components:      Result Value   Glucose, Bld 285 (*)    Creatinine, Ser 1.02 (*)    Calcium 8.7 (*)    All other components within normal limits  CBC - Abnormal; Notable for the following components:   WBC 13.3 (*)    RBC 2.88 (*)    Hemoglobin 9.0 (*)    HCT 29.0 (*)    MCV 100.7 (*)    All other components within normal limits  BRAIN NATRIURETIC PEPTIDE - Abnormal; Notable for the following components:   B Natriuretic Peptide 319.5 (*)    All  other components within normal limits  I-STAT ARTERIAL BLOOD GAS, ED - Abnormal; Notable for the following components:   pH, Arterial 7.487 (*)      pCO2 arterial 30.9 (*)    pO2, Arterial 57 (*)    Calcium, Ion 1.13 (*)    HCT 27.0 (*)    Hemoglobin 9.2 (*)    All other components within normal limits  TROPONIN I (HIGH SENSITIVITY) - Abnormal; Notable for the following components:   Troponin I (High Sensitivity) 60 (*)    All other components within normal limits  SARS CORONAVIRUS 2 BY RT PCR (HOSPITAL ORDER, [facility])  URINALYSIS, ROUTINE W REFLEX MICROSCOPIC  TROPONIN I (HIGH SENSITIVITY)    EKG EKG Interpretation  Date/Time:  Tuesday December 08 2019 09:41:28 EDT Ventricular Rate:  103 PR Interval:  120 QRS Duration: 84 QT Interval:  346 QTC Calculation: 453 R Axis:   117 Text Interpretation: Sinus tachycardia Left posterior fascicular block Cannot rule out Anterior infarct , age undetermined T wave abnormality Abnormal ECG Confirmed by Carmin Muskrat [phone]) on 12/08/2019 2:40:16 PM   Radiology DG Chest 2 View  Result Date: 12/08/2019 CLINICAL DATA:  Chest pain and shortness of breath for 2 days. Hypoxia. EXAM: CHEST - 2 VIEW COMPARISON:  03/27/2019 FINDINGS: Stable mild cardiomegaly. 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 Diffuse interstitial and airspace disease shows mild increase since previous study, most likely due to diffuse pulmonary edema. No evidence of pleural effusion. IMPRESSION: Mild increase in diffuse interstitial and airspace disease, likely due to diffuse pulmonary edema. Electronically Signed   By: Marlaine Hind M.D.   On: 12/08/2019 10:24    Procedures Procedures (including critical care time)  Medications Ordered in ED Medications  sodium chloride flush (NS) 0.9 % injection 3 mL (3 mLs Intravenous Given 12/08/19 1243)  furosemide (LASIX) injection 40 mg (40 mg Intravenous Given 12/08/19 1242)  ondansetron (ZOFRAN) injection 4 mg (4 mg Intravenous Given 12/08/19 1243)    ED Course  I have reviewed the triage vital signs and the nursing notes.  Pertinent  labs & imaging results that were available during my care of the patient were reviewed by me and considered in my medical decision making (see chart for details).  Clinical Course as of Dec 08 1447  Tue Dec 08, 2019  1430 WBC(!): 13.3 [JS]  1439 Will begin Bipap treatment at this time.  SARS Coronavirus 2: NEGATIVE [JS]  1440 B Natriuretic Peptide(!): 319.5 [JS]  1440 Troponin I (High Sensitivity)(!): 60 [JS]    Clinical Course User Index [JS] Janeece Fitting, PA-C   MDM Rules/Calculators/A&P   Patient with a past medical history of COPD, CHF presents to the ED with worsening shortness of breath for the past 2 weeks.  Evaluated by her PCP 2 weeks ago, placed on antibiotics for symptomatic treatment.  Reports that she is currently taking Lasix 80 mg daily, did not take these yesterday as she spent most of the day sleeping.  Also endorses a productive cough, with yellow to green sputum.  She is currently on chronic oxygen at home, reports using 3 L of O2 while at night, this has changed as she has been using oxygen throughout the day, arrived with a new oxygen requirement, additional 4 L were added due to hypoxia, patient was ranging stats between low 90s and higher 80s.  Tachypnea is present during primary evaluation.  During evaluation patient arrives tachypneic with respirations along the 30s to  40s, oxygen saturation is 72% on 3 L of oxygen, she was immediately placed on 6 L of O2, asked to take some deep breaths, oxygen saturation brought up to 92%.  Heart rate appears elevated at 105, she is unable to speak in full sentences.  Hypertensive in nature.  States that she had missed her fluid pill yesterday as she spent most of the day sleeping.  There is no pitting edema to bilateral legs, no calf tenderness noted.  Lungs are diminished to auscultation, she is having a very hard time taking some deep breaths.  Discussed BiPAP placement with patient.  I have obtained labs and data reviewed by me, BMP  without any electrolyte derangement, glucose is elevated with patient history of diabetes.  Creatinine level slightly elevated.  Her CBC is remarkable for slight leukocytosis.  BNP is 319, this is elevated compared to patient's prior visits.  Her blood gas was obtained in order to further reevaluate therapy after intervention.  Mild CO2 retention.  Troponin level was 16, does have an extensive history of mitral stenosis.  Covid test was ordered in order to place patient on a BiPAP.  Patient does have oxygen at home which she has been using however this only meant to do so during night, reports her orthopnea has worsened.She reports compliance with her medication is currently anticoagulated. She is tachycardic and hypoxic, thought PE but patient is anticoagulated reports compliance with this medication.  DG chest xray showed: Mild increase in diffuse interstitial and airspace disease, likely  due to diffuse pulmonary edema.       Patient was provided with IV Lasix 40 mg, was also even Zofran as she began actively vomiting during our encounter.  2:47 PM Spoke to Dr. Dan Maker, hospitalist service who will admit patient for further management.     Portions of this note were generated with Lobbyist. Dictation errors may occur despite best attempts at proofreading.  Final Clinical Impression(s) / ED Diagnoses Final diagnoses:  Shortness of breath  Congestive heart failure, unspecified HF chronicity, unspecified heart failure type [facility])    Rx / DC Orders ED Discharge Orders    None       Janeece Fitting, PA-C 12/08/19 1449    Carmin Muskrat, MD 12/09/19 1635

## 2019-12-08 NOTE — ED Triage Notes (Signed)
Patient arrives to ED with complaints of worsening shortness of breath. Patient states she been SOB over the last couple of weeks that has worsened today. Patient on 3L O2 at home and noticed her O2 saturation was 72% this morning. Hx CHF.

## 2019-12-08 NOTE — ED Notes (Signed)
Walked patient to the bathroom patient did well patient back in bed on the monitor with family at bedside and call bell in reach

## 2019-12-09 ENCOUNTER — Inpatient Hospital Stay (HOSPITAL_COMMUNITY): Payer: Medicare PPO

## 2019-12-09 DIAGNOSIS — I5031 Acute diastolic (congestive) heart failure: Secondary | ICD-10-CM

## 2019-12-09 LAB — BASIC METABOLIC PANEL
Anion gap: 10 (ref 5–15)
BUN: 10 mg/dL (ref 6–20)
CO2: 29 mmol/L (ref 22–32)
Calcium: 8.2 mg/dL — ABNORMAL LOW (ref 8.9–10.3)
Chloride: 99 mmol/L (ref 98–111)
Creatinine, Ser: 1.04 mg/dL — ABNORMAL HIGH (ref 0.44–1.00)
GFR calc Af Amer: >60 mL/min (ref >60)
GFR calc non Af Amer: >60 mL/min (ref >60)
Glucose, Bld: 127 mg/dL — ABNORMAL HIGH (ref 70–99)
Potassium: 3 mmol/L — ABNORMAL LOW (ref 3.5–5.1)
Sodium: 138 mmol/L (ref 135–145)

## 2019-12-09 LAB — ECHOCARDIOGRAM COMPLETE
Height: 66 in
Weight: 2839.52 oz

## 2019-12-09 LAB — RETICULOCYTES
Immature Retic Fract: 24.5 % — ABNORMAL HIGH (ref 2.3–15.9)
RBC.: 2.59 MIL/uL — ABNORMAL LOW (ref 3.87–5.11)
Retic Count, Absolute: 77.4 10*3/uL (ref 19.0–186.0)
Retic Ct Pct: 3 % (ref 0.4–3.1)

## 2019-12-09 LAB — TROPONIN I (HIGH SENSITIVITY): Troponin I (High Sensitivity): 52 ng/L — ABNORMAL HIGH (ref <18)

## 2019-12-09 LAB — CBC
HCT: 25.3 % — ABNORMAL LOW (ref 36.0–46.0)
Hemoglobin: 7.9 g/dL — ABNORMAL LOW (ref 12.0–15.0)
MCH: 31.1 pg (ref 26.0–34.0)
MCHC: 31.2 g/dL (ref 30.0–36.0)
MCV: 99.6 fL (ref 80.0–100.0)
Platelets: 242 10*3/uL (ref 150–400)
RBC: 2.54 MIL/uL — ABNORMAL LOW (ref 3.87–5.11)
RDW: 14.6 % (ref 11.5–15.5)
WBC: 8.8 10*3/uL (ref 4.0–10.5)
nRBC: 0 % (ref 0.0–0.2)

## 2019-12-09 LAB — IRON AND TIBC
Iron: 39 ug/dL (ref 28–170)
Saturation Ratios: 16 % (ref 10.4–31.8)
TIBC: 248 ug/dL — ABNORMAL LOW (ref 250–450)
UIBC: 209 ug/dL

## 2019-12-09 LAB — PROTIME-INR
INR: 1.4 — ABNORMAL HIGH (ref 0.8–1.2)
Prothrombin Time: 17 seconds — ABNORMAL HIGH (ref 11.4–15.2)

## 2019-12-09 LAB — FERRITIN: Ferritin: 938 ng/mL — ABNORMAL HIGH (ref 11–307)

## 2019-12-09 LAB — GLUCOSE, CAPILLARY
Glucose-Capillary: 122 mg/dL — ABNORMAL HIGH (ref 70–99)
Glucose-Capillary: 120 mg/dL — ABNORMAL HIGH (ref 70–99)
Glucose-Capillary: 142 mg/dL — ABNORMAL HIGH (ref 70–99)
Glucose-Capillary: 113 mg/dL — ABNORMAL HIGH (ref 70–99)

## 2019-12-09 LAB — VITAMIN B12: Vitamin B-12: 301 pg/mL (ref 180–914)

## 2019-12-09 LAB — FOLATE: Folate: 8.1 ng/mL (ref >5.9)

## 2019-12-09 MED ORDER — WARFARIN SODIUM 5 MG PO TABS
5.0000 mg | ORAL_TABLET | Freq: Once | ORAL | Status: AC
  Administered 2019-12-09: 5 mg via ORAL
  Filled 2019-12-09: qty 1

## 2019-12-09 MED ORDER — POTASSIUM CHLORIDE CRYS ER 20 MEQ PO TBCR
40.0000 meq | EXTENDED_RELEASE_TABLET | Freq: Once | ORAL | Status: AC
  Administered 2019-12-09: 40 meq via ORAL
  Filled 2019-12-09: qty 2

## 2019-12-09 MED ORDER — POTASSIUM CHLORIDE CRYS ER 20 MEQ PO TBCR
40.0000 meq | EXTENDED_RELEASE_TABLET | Freq: Three times a day (TID) | ORAL | Status: DC
  Administered 2019-12-09: 40 meq via ORAL
  Filled 2019-12-09 (×2): qty 2

## 2019-12-09 MED ORDER — SODIUM CHLORIDE 0.9 % IV BOLUS
250.0000 mL | Freq: Once | INTRAVENOUS | Status: AC
  Administered 2019-12-09: 250 mL via INTRAVENOUS

## 2019-12-09 MED ORDER — POTASSIUM CHLORIDE CRYS ER 20 MEQ PO TBCR
40.0000 meq | EXTENDED_RELEASE_TABLET | ORAL | Status: AC
  Administered 2019-12-09: 40 meq via ORAL
  Filled 2019-12-09: qty 2

## 2019-12-09 MED ORDER — POTASSIUM CHLORIDE CRYS ER 20 MEQ PO TBCR: 40.0000 meq | EXTENDED_RELEASE_TABLET | Freq: Once | ORAL | Status: DC

## 2019-12-09 MED ORDER — SACUBITRIL-VALSARTAN 24-26 MG PO TABS
1.0000 | ORAL_TABLET | Freq: Two times a day (BID) | ORAL | Status: DC
  Filled 2019-12-09 (×2): qty 1

## 2019-12-09 MED ORDER — MAGNESIUM SULFATE 4 GM/100ML IV SOLN
4.0000 g | Freq: Once | INTRAVENOUS | Status: AC
  Administered 2019-12-09: 4 g via INTRAVENOUS
  Filled 2019-12-09 (×2): qty 100

## 2019-12-09 MED ORDER — ENOXAPARIN SODIUM 80 MG/0.8ML ~~LOC~~ SOLN
80.0000 mg | Freq: Two times a day (BID) | SUBCUTANEOUS | Status: DC
  Administered 2019-12-09 – 2019-12-12 (×6): 80 mg via SUBCUTANEOUS
  Filled 2019-12-09 (×6): qty 0.8

## 2019-12-09 MED ORDER — FUROSEMIDE 10 MG/ML IJ SOLN
40.0000 mg | Freq: Three times a day (TID) | INTRAMUSCULAR | Status: DC
  Administered 2019-12-10: 40 mg via INTRAVENOUS
  Filled 2019-12-09 (×2): qty 4

## 2019-12-09 NOTE — TOC Initial Note (Signed)
Transition of Care [facility]) - Initial/Assessment Note    Patient Details  Name: Holly Figueroa MRN: [id number] Date of Birth: 30-Aug-1965  Transition of Care [facility]) CM/SW Contact:    Carles Collet, RN Phone Number: 12/09/2019, 12:12 PM  Clinical Narrative:         Spoke w patient at bedside. She confirms that she has home oxygen, she uses it nocturnally. Provider is [facility]. If patient will need daytime oxygen, she will need ambulatory sats noted and a new DME oxygen order sent to [facility].           Expected Discharge Plan: Home/Self Care Barriers to Discharge: Continued Medical Work up   Patient Goals and CMS Choice        Expected Discharge Plan and Services Expected Discharge Plan: Home/Self Care                                              Prior Living Arrangements/Services                       Activities of Daily Living      Permission Sought/Granted                  Emotional Assessment              Admission diagnosis:  Shortness of breath [R06.02] CHF exacerbation (HCC) [I50.9] Congestive heart failure, unspecified HF chronicity, unspecified heart failure type ([place]) [I50.9] Patient Active Problem List   Diagnosis Date Noted  . CHF exacerbation ([place]) 12/08/2019  . [place] 12/08/2019  . Diabetes mellitus without complication ([place])   . Mitral stenosis   . Elevated troponin   . Depression   . Anxiety   . Gastroesophageal reflux disease   . Chronic obstructive pulmonary disease ([place])   . Acute on chronic respiratory failure with hypoxia ([place]) 03/27/2019  . Shock ([place])   . Pressure injury of skin 09/02/2017  . Sepsis ([place]) 08/24/2017  . HCAP (healthcare-associated pneumonia) 08/24/2017  . Rectal bleeding   . Syncope 08/19/2017  . Hematemesis 08/19/2017  . Iron deficiency anemia due to chronic blood loss   . Esophageal dysphagia   . Acute on chronic diastolic CHF (congestive heart failure) ([place]) 06/12/2017  .  Hematochezia 06/12/2017  . Macrocytic anemia 06/11/2017  . ARDS (adult respiratory distress syndrome) ([place]) 10/30/2014  . Bilateral pneumonia 10/29/2014  . CAP (community acquired pneumonia) 10/29/2014  . Hypokalemia   . Hypoxia   . UTI (urinary tract infection)   . Tobacco abuse 10/11/2010  . Acute respiratory failure ([place]) 10/11/2010   PCP:  Glenda Chroman, MD Pharmacy:   [place], [place] - [address] [place] [address] Phone: [phone] Fax: [phone]     Social Determinants of Health (SDOH) Interventions    Readmission Risk Interventions No flowsheet data found.

## 2019-12-09 NOTE — Evaluation (Addendum)
Occupational Therapy Evaluation Patient Details Name: Holly Figueroa MRN: [id number] DOB: [birth year] Today's Date: 12/09/2019    History of Present Illness 54 y.o. female with medical history significant of 1, hyperlipidemia, diabetes mellitus, depression/anxiety, tobacco abuse, MVR on Coumadin therapy, GERD, obesity presents to emergency department due to worsening shortness of breath, cough, congestion and hypoxia.   Clinical Impression   Patient with functional deficits listed below impacting safety and independence with self care. Patient primarily supervision level with x1 loss of balance off toilet with min G for safety. With O2 doffed patient fluctuate between 79-88% however difficulty maintaining good pleth wave. Cue patient in pursed lip breathing techniques. Will continue to follow.  BP semi-supine 103/51 BP EOB 102/46 BP post amb bathroom 116/56    Follow Up Recommendations  No OT follow up    Equipment Recommendations  Tub/shower seat       Precautions / Restrictions Precautions Precautions: Fall Precaution Comments: monitor O2 Restrictions Weight Bearing Restrictions: No      Mobility Bed Mobility Overal bed mobility: Modified Independent                Transfers Overall transfer level: Needs assistance Equipment used: None Transfers: Sit to/from Stand Sit to Stand: Supervision;Min guard         General transfer comment: x1 loss of balance off toilet- min G, otherwise supervision    Balance Overall balance assessment: Mild deficits observed, not formally tested                                         ADL either performed or assessed with clinical judgement   ADL Overall ADL's : Needs assistance/impaired     Grooming: Oral care;Wash/dry face;Wash/dry hands;Supervision/safety;Standing Grooming Details (indicate cue type and reason): cues for safety, patient brushing teeth with O2 tubing in her mouth Upper Body Bathing:  Set up;Sitting   Lower Body Bathing: Supervison/ safety;Min guard;Sitting/lateral leans;Sit to/from stand   Upper Body Dressing : Set up;Sitting   Lower Body Dressing: Supervision/safety;Min guard Lower Body Dressing Details (indicate cue type and reason): min G for safety with balance while donning clean mesh underwear, set up to don clean socks seated Toilet Transfer: Supervision/safety;Min guard;Cueing for Administrator, sports Details (indicate cue type and reason): x1 minor loss of balance transferring off toilet with min G assist for safety Toileting- Clothing Manipulation and Hygiene: Supervision/safety;Min guard;Sit to/from stand;Sitting/lateral lean       Functional mobility during ADLs: Supervision/safety;Min guard       Vision Baseline Vision/History: Wears glasses Wears Glasses: At all times Patient Visual Report: No change from baseline              Pertinent Vitals/Pain Pain Assessment: No/denies pain     Hand Dominance Right   Extremity/Trunk Assessment Upper Extremity Assessment Upper Extremity Assessment: Overall WFL for tasks assessed   Lower Extremity Assessment Lower Extremity Assessment: Defer to PT evaluation   Cervical / Trunk Assessment Cervical / Trunk Assessment: Normal   Communication Communication Communication: No difficulties   Cognition Arousal/Alertness: Awake/alert (initially lethargic but more alert with activity) Behavior During Therapy: WFL for tasks assessed/performed Overall Cognitive Status: Within Functional Limits for tasks assessed  General Comments  doff O2 to bathroom, patient fluctuating between 79-88% on room air difficulty with adequate pleth wave. cue for pursed lip breathing            Home Living Family/patient expects to be discharged to:: Private residence Living Arrangements: Spouse/significant other Available Help at Discharge:  Family Type of Home: House Home Access: Stairs to enter Technical brewer of Steps: 4 Entrance Stairs-Rails: Right;Left Home Layout: One level     Bathroom Shower/Tub: Occupational psychologist: [place]: None          Prior Functioning/Environment Level of Independence: Independent        Comments: wears 3.5L O2 at night        OT Problem List: Decreased activity tolerance;Decreased safety awareness      OT Treatment/Interventions: Self-care/ADL training;Therapeutic exercise;Energy conservation;DME and/or AE instruction;Therapeutic activities;Patient/family education;Balance training    OT Goals(Current goals can be found in the care plan section) Acute Rehab OT Goals Patient Stated Goal: "I feel so weak" OT Goal Formulation: With patient Time For Goal Achievement: 12/23/19 Potential to Achieve Goals: Good  OT Frequency: Min 2X/week    AM-PAC OT "6 Clicks" Daily Activity     Outcome Measure Help from another person eating meals?: None Help from another person taking care of personal grooming?: A Little Help from another person toileting, which includes using toliet, bedpan, or urinal?: A Little Help from another person bathing (including washing, rinsing, drying)?: A Little Help from another person to put on and taking off regular upper body clothing?: A Little Help from another person to put on and taking off regular lower body clothing?: A Little 6 Click Score: 19   End of Session Equipment Utilized During Treatment: Oxygen Nurse Communication: Mobility status;Other (comment) (need for pure wick and new O2 sensor)  Activity Tolerance: Patient tolerated treatment well Patient left: in bed;with call bell/phone within reach;with bed alarm set  OT Visit Diagnosis: Other abnormalities of gait and mobility (R26.89)                Time: 4301-4840 OT Time Calculation (min): 31 min Charges:  OT General Charges $OT Visit: 1  Visit OT Evaluation $OT Eval Moderate Complexity: 1 Mod OT Treatments $Self Care/Home Management : 8-22 mins  Delbert Phenix OT OT office: [place] 12/09/2019, 12:13 PM

## 2019-12-09 NOTE — Consult Note (Signed)
CARDIOLOGY CONSULT NOTE  Patient ID: Holly Figueroa MRN: [id number] DOB/AGE: 53-30-1967 53 y.o.  Admit date: 12/08/2019 Referring Physician  Jacki Cones Primary Physician:  Glenda Chroman, MD Reason for Consultation  CHF  Patient ID: Holly Figueroa, female    DOB: [birth year], 54 y.o.   MRN: [id number]  Chief Complaint  Patient presents with  . Shortness of Breath   HPI:    Holly Figueroa  is a 54 y.o. female with a hx of mitral stenosis and aortic stenosis s/p mMVR and mAVR (Dr. Lincoln Brigham, 11/01/2017), HTN, COPD, DM2, IDA, GERD, prior tobacco use disorder quit in May 2019 after valve surgery, OSA unable to tolerate CPAP and presently on nocturnal O2 therapy who has had multiple outpatient evaluations and also recent hospitalization with recurrent pulmonary edema.  I had not seen her for almost 2 years.  Since I had last seen her, she has undergone mitral valve and aortic valve replacement.  Postoperative course was complicated by sternal dehiscence and wound infection, recurrent pulmonary edema, paroxysmal atrial fibrillation and recurrent congestive heart failure.  She has had multiple hospitalizations and office visits due to continued dyspnea, presently sleeps on 6 pillows.  Last month was treated for "pneumonia" felt better, but again started having marked dyspnea about a week ago and could not breathe and presented to the emergency room and was on nonrebreather including BiPAP and was started on diuresis.  She is now feeling better but still has shortness of breath.  Past Medical History:  Diagnosis Date  . Acute respiratory failure ([place])   . Anemia 08-2008   Blood transfusion  . Anxiety   . Benzodiazepine dependence ([place])   . Cervical cancer ([place])   . CHF (congestive heart failure) ([place])   . Depression   . Diabetes mellitus without complication ([place])   . Headache(784.0)    migraines  . Hypokalemia   . Legionella pneumonia ([place])   . Leukocytosis, unspecified   .  Mitral stenosis   . Panic attacks   . Tobacco abuse    Past Surgical History:  Procedure Laterality Date  . BIOPSY  06/12/2017   Procedure: BIOPSY;  Surgeon: Daneil Dolin, MD;  Location: [facility];  Service: Gastroenterology;;  esophagus  . cardiac valve repair  2011   stretched mitral valve  . CERVICAL CONIZATION W/BX  2000  . CHOLECYSTECTOMY  06/26/2012   Procedure: LAPAROSCOPIC CHOLECYSTECTOMY WITH INTRAOPERATIVE CHOLANGIOGRAM;  Surgeon: Ralene Ok, MD;  Location: [facility];  Service: General;  Laterality: N/A;  . COLONOSCOPY WITH PROPOFOL N/A 06/12/2017   Normal colon, normal TI, Grade I internal hemorrhoids  . ESOPHAGOGASTRODUODENOSCOPY (EGD) WITH PROPOFOL N/A 06/12/2017   esophagitis, possibly chemical or pill-induced, s/p dilation. small hiatal hernia, normal duodenal bulb and D 2  . GIVENS CAPSULE STUDY N/A 07/19/2017   incomplete capsule study as did not reach cecum.   Marland Kitchen LASER ABLATION OF THE CERVIX    . MALONEY DILATION  06/12/2017   Procedure: MALONEY DILATION;  Surgeon: Daneil Dolin, MD;  Location: [facility];  Service: Gastroenterology;;  . MOLE REMOVAL    . NASAL SINUS SURGERY     Social History   Socioeconomic History  . Marital status: Married    Spouse name: Pieter Partridge  . Number of children: Not on file  . Years of education: Not on file  . Highest education level: Not on file  Occupational History  . Not on file  Tobacco Use  . Smoking status: Current  Every Day Smoker    Packs/day: 1.00    Years: 23.00    Pack years: 23.00    Types: Cigarettes    Last attempt to quit: 05/06/2017    Years since quitting: 2.5  . Smokeless tobacco: Never Used  Vaping Use  . Vaping Use: Never used  Substance and Sexual Activity  . Alcohol use: No    Alcohol/week: 0.0 standard drinks  . Drug use: No  . Sexual activity: Yes  Other Topics Concern  . Not on file  Social History Narrative  . Not on file   Social Determinants of Health   Financial Resource  Strain:   . Difficulty of Paying Living Expenses:   Food Insecurity:   . Worried About Charity fundraiser in the Last Year:   . Arboriculturist in the Last Year:   Transportation Needs:   . Film/video editor (Medical):   Marland Kitchen Lack of Transportation (Non-Medical):   Physical Activity:   . Days of Exercise per Week:   . Minutes of Exercise per Session:   Stress:   . Feeling of Stress :   Social Connections:   . Frequency of Communication with Friends and Family:   . Frequency of Social Gatherings with Friends and Family:   . Attends Religious Services:   . Active Member of Clubs or Organizations:   . Attends Archivist Meetings:   Marland Kitchen Marital Status:   Intimate Partner Violence:   . Fear of Current or Ex-Partner:   . Emotionally Abused:   Marland Kitchen Physically Abused:   . Sexually Abused:    ROS  Review of Systems  Cardiovascular: Positive for dyspnea on exertion. Negative for chest pain and leg swelling.  Respiratory: Positive for cough, sleep disturbances due to breathing and wheezing. Negative for sputum production.   Musculoskeletal: Positive for arthritis and back pain.  Gastrointestinal: Negative for hematemesis, hematochezia and melena.  All other systems reviewed and are negative.  Objective   Vitals with BMI 12/09/2019 12/09/2019 12/09/2019  Height - - -  Weight - - -  BMI - - -  Systolic 540 086 761  Diastolic 40 54 57  Pulse 64 65 65    Blood pressure (!) 105/40, pulse 64, temperature 97.6 F (36.4 C), temperature source Oral, resp. rate 20, height _0  (1.676 m), weight 80.5 kg, SpO2 98 %. Body mass index is 28.64 kg/m.    Physical Exam Constitutional:      Appearance: She is well-developed.  Neck:     Vascular: No JVD.  Cardiovascular:     Rate and Rhythm: Normal rate and regular rhythm.     Pulses: Normal pulses.          Carotid pulses are on the right side with bruit and on the left side with bruit.    Heart sounds: Heart sounds are distant.  Murmur heard.  Harsh midsystolic murmur is present at the upper right sternal border and apex radiating to the neck.      Comments: No edema. Peripheral vascular exam is normal. Pulmonary:     Effort: Tachypnea and accessory muscle usage present. No respiratory distress.     Breath sounds: Examination of the right-lower field reveals wheezing and rales. Examination of the left-lower field reveals wheezing and rales. Decreased breath sounds, wheezing and rales present.  Musculoskeletal:     Right lower leg: No edema.  Skin:    General: Skin is warm and dry.  Neurological:  General: No focal deficit present.     Mental Status: She is alert.    Laboratory examination:   Recent Labs    03/29/19 0625 03/29/19 0625 12/08/19 0958 12/08/19 1249 12/09/19 0554  NA 140   < > 135 139 138  K 4.2   < > 3.5 3.6 3.0*  CL 105  --  100  --  99  CO2 24  --  23  --  29  GLUCOSE 153*  --  285*  --  127*  BUN 20  --  9  --  10  CREATININE 0.88  --  1.02*  --  1.04*  CALCIUM 9.1  --  8.7*  --  8.2*  GFRNONAA >60  --  >60  --  >60  GFRAA >60  --  >60  --  >60   < > = values in this interval not displayed.   estimated creatinine clearance is 67.7 mL/min (A) (by C-G formula based on SCr of 1.04 mg/dL (H)).  CMP Latest Ref Rng & Units 12/09/2019 12/08/2019 12/08/2019  Glucose 70 - 99 mg/dL 127(H) - 285(H)  BUN 6 - 20 mg/dL 10 - 9  Creatinine 0.44 - 1.00 mg/dL 1.04(H) - 1.02(H)  Sodium 135 - 145 mmol/L 138 139 135  Potassium 3.5 - 5.1 mmol/L 3.0(L) 3.6 3.5  Chloride 98 - 111 mmol/L 99 - 100  CO2 22 - 32 mmol/L 29 - 23  Calcium 8.9 - 10.3 mg/dL 8.2(L) - 8.7(L)  Total Protein 6.5 - 8.1 g/dL - - -  Total Bilirubin 0.3 - 1.2 mg/dL - - -  Alkaline Phos 38 - 126 U/L - - -  AST 15 - 41 U/L - - -  ALT 0 - 44 U/L - - -   CBC Latest Ref Rng & Units 12/09/2019 12/08/2019 12/08/2019  WBC 4.0 - 10.5 K/uL 8.8 - 13.3(H)  Hemoglobin 12.0 - 15.0 g/dL 7.9(L) 9.2(L) 9.0(L)  Hematocrit 36 - 46 % 25.3(L) 27.0(L)  29.0(L)  Platelets 150 - 400 K/uL 242 - 266   Lipid Panel     Component Value Date/Time   CHOL 98 08/20/2017 0524   TRIG 121 09/09/2017 1540   HDL 34 (L) 08/20/2017 0524   CHOLHDL 2.9 08/20/2017 0524   VLDL 22 08/20/2017 0524   LDLCALC 42 08/20/2017 0524   HEMOGLOBIN A1C Lab Results  Component Value Date   HGBA1C 6.5 (H) 12/08/2019   MPG 139.85 12/08/2019   TSH Recent Labs    03/28/19 0240 12/08/19 1501  TSH 0.415 0.654   BNP (last 3 results) Recent Labs    03/28/19 0248 12/08/19 1230  BNP 162.0* 319.5*    Medications and allergies   Allergies  Allergen Reactions  . Iodinated Diagnostic Agents Anaphylaxis  . Aspirin Other (See Comments)    "Possible blood in stool" the 356m. Can take 868m  . Ibuprofen Other (See Comments)    "Possible blood in stool"  . Sulfa Antibiotics Hives  . Zofran [Ondansetron Hcl]     Bad headaches  . Dilaudid [Hydromorphone Hcl] Itching and Palpitations  . Hydrocodone Palpitations     . sodium chloride      Current Outpatient Medications  Medication Instructions  . acetaminophen (TYLENOL) 500 mg, Oral, Every 6 hours PRN  . ALPRAZolam (XANAX) 0.5 mg, Oral, 3 times daily, anxiety  . aspirin EC 81 mg, Oral, Daily after breakfast  . cyanocobalamin 1,000 mcg, Oral, Daily  . FLUoxetine HCl 60 mg, Oral, Daily  .  fluticasone (FLONASE) 50 MCG/ACT nasal spray 1 spray, Each Nare, Daily  . furosemide (LASIX) 20 mg, Oral, Daily  . glipiZIDE (GLUCOTROL XL) 5 mg, Oral, 2 times daily  . loratadine (CLARITIN) 10 mg, Oral, Daily  . metFORMIN (GLUCOPHAGE) 500 mg, Oral, 2 times daily with meals  . metoprolol tartrate (LOPRESSOR) 50 mg, Oral, 2 times daily  . pantoprazole (PROTONIX) 40 mg, Oral, Daily after breakfast  . potassium chloride SA (KLOR-CON) 20 MEQ tablet 20 mEq, Oral, Daily  . rosuvastatin (CRESTOR) 10 mg, Oral, Daily after breakfast  . traZODone (DESYREL) 100 mg, Oral, Daily at bedtime  . umeclidinium-vilanterol (ANORO ELLIPTA)  62.5-25 MCG/INH AEPB 1 puff, Inhalation, Daily  . Vitamin D-3 5,000 Units, Oral, Daily after breakfast  . warfarin (COUMADIN) 5 mg, Oral, Nightly    I/O last 3 completed shifts: In: 840 [P.O.:490; I.V.:250; IV Piggyback:100] Out: 2350 [Urine:2350] Total I/O In: -  Out: 300 [Urine:300]    Radiology:   DG Chest 2 View  Result Date: 12/08/2019 CLINICAL DATA:  Chest pain and shortness of breath for 2 days. Hypoxia. EXAM: CHEST - 2 VIEW COMPARISON:  03/27/2019 FINDINGS: Stable mild cardiomegaly. 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 Diffuse interstitial and airspace disease shows mild increase since previous study, most likely due to diffuse pulmonary edema. No evidence of pleural effusion. IMPRESSION: Mild increase in diffuse interstitial and airspace disease, likely due to diffuse pulmonary edema. Electronically Signed   By: Marlaine Hind M.D.   On: 12/08/2019 10:24   Cardiac Studies:   Valve [facility] at [facility] 04/03/2019: Mechanical prosthetic AV 61m ST JUDE REGENT STANDARD MECH PROSTHETIC MV: 259mST JUDE REGENT STANDARD   [place] NORMAL RIGHT VENTRICULAR SYSTOLIC FUNCTION VALVULAR REGURGITATION: TRIVIAL PR, MILD TR PROSTHETIC VALVE(S): MECH PROSTHETIC AoV, MECH PROSTHETIC, peak velocity 3.5 m/s, peak pressure gradient 48 and mean pressure gradient 25 mmHg.  Dimensionless index 0.49. ESTIMATED RVSP AT LEAST 3075m THERE APPEARS TO BE AT LEAST MILD MR, THOUGH MECHANICAL MITRAL VALVE MV, peak 16m19mand mean 7 mmHg. PREVENTS ACCURATE ASSESSMENT. PA ACCELERATION TIME OF 60 msec IS CONSISTENT WITH ELEVATED PA PRESSURES. ESTIMATED PA MEAN IS 53mm50m RHC 1[place]2/20 State: Baseline RA: 14 mmHg (mean) RV: 54/ 11 mmHg PA: 54/ 25 36 mmHg (mean) PCW: 22 mmHg (mean) AV O2: 3.8 vol% Cardiac output: 6.0 L/min Cardiac index: 3.1 L/min-m2 PVR: 2.3 Wood units _____________________  Echocardiogram 12/09/2019: 1.  Left ventricular ejection fraction, by estimation, is 55 to 60%. The  left ventricle has normal function. The left ventricle has no regional  wall motion abnormalities. There is mild concentric left ventricular  hypertrophy. Left ventricular diastolic  parameters are indeterminate. Elevated left ventricular end-diastolic pressure.  2. Right ventricular systolic function is normal. The right ventricular  size is normal. There is normal pulmonary artery systolic pressure. The  estimated right ventricular systolic pressure is 33.0 16.1.  3. Left atrial size was mildly dilated.  4. The mitral valve has been repaired/replaced. No evidence of mitral  stenosis. There is a 25 mm mechanical valve present in the mitral  position. Echo findings are consistent with normal structure and function  of the mitral valve prosthesis. Mildly  increased gradient across MV with MV peak gradient, 15.6 mmHg. The mean  mitral valve gradient is 5 mmHg. Mitral regurgitation cannot be assessed  due to LA shadowing from mechanical MV mitral valve regurgitation.  5. The aortic valve has been repaired/replaced. There is  a mechanical  valve present in the aortic position. Echo findings are consistent with  normal structure and function of the aortic valve prosthesis. Trivial  perivalvular AI is present. There is a  mild gradient across the AV with mean AVG 30mHg.  6. The inferior vena cava is normal in size with greater than 50%  respiratory variability, suggesting right atrial pressure of 3 mmHg.  7. Compared to echo of 03/2019, the mean MVG of MV mechanical prosthesis  has improved from 7 to 561mg. The mean AVG of the AV mechanical prosthesis  has also improved from 29 to 2532m.    Assessment   MicZEMA LIZARDOs a 53 12o. female with a hx of mitral stenosis and aortic stenosis s/p mMVR and mAVR (Dr. HanLincoln Brigham/03/2018), HTN, COPD, DM2, IDA, GERD, prior tobacco use disorder quit in May 2019 after valve surgery, OSA  unable to tolerate CPAP and presently on nocturnal O2 therapy who has had multiple outpatient evaluations and also recent hospitalization with recurrent pulmonary edema.  I  1.  Recurrent acute pulmonary edema 2.  History of mechanical aortic and mitral valve replacement on 04/03/2019  AV 58m63m JUDE REGENT STANDARD MECH PROSTHETIC MV: 25mm49mJUDE REGENT STANDARD 3.  Mechanical aortic valve dysfunction with moderate aortic valve stenosis, peak gradient close to 4 m/s with a mean gradient of 25 mmHg. 4.  Hypertension 5.  Long-term anticoagulation with warfarin 6. Essential hypertension 7. OSA not on CPAP and presently on home O2.  Recommendations:   Extremely complex patient with very complex medical history, I took 60 minutes just to evaluate her medical records and figure out exact type of valve and recurrent hospital admissions that she has gone through.  Patient presented with recurrent pulmonary edema needs further evaluation, she will need TEE to evaluate for aortic valve dysfunction.  Although reported as normal, I do not agree with TTE statement, suspect aortic valve to be the culprit.  Renal artery stenosis needs to be excluded if indeed aortic valve is opening well.  This will be very unusual.   She has been trying to be careful with her fluid intake and salt intake and has lost fairly significant amount of weight since had last seen her in 2019.  She is also quit smoking.  Her presentation with chronic orthopnea, episodes of PND and acute pulmonary edema are also consistent with acute diastolic heart failure related to valvular dysfunction.  I could not find her cardiac catheterization report but patient tells me that she had no significant coronary artery disease.  She probably will also need right heart catheterization.  I have increased the dose of furosemide to 40 mg 3 times daily, added Entresto 24/26 mg twice daily and potassium supplements as well.  I will continue to  follow. This was a 45 minute consult and 60 minutes of medical record evaluation of external records. Complex history, coordination of care, complex decision making and independent review of echo done today.   Millenia Waldvogel GAdrian Prows FACC [facility]/2021, 9:01 PM [email or web address] Pager: [phone] Office: [phone]

## 2019-12-09 NOTE — Progress Notes (Signed)
SATURATION QUALIFICATIONS: (This note is used to comply with regulatory documentation for home oxygen)  Patient Saturations on Room Air at Rest = 88%  Patient Saturations on Room Air while Ambulating = 83%  Patient Saturations on 3 Liters of oxygen while Ambulating = 89%  Please briefly explain why patient needs home oxygen:Pt needed O2 with activity.  Maryl Blalock W,PT Acute Rehabilitation Services Pager:  [phone]  Office:  [phone]

## 2019-12-09 NOTE — Progress Notes (Signed)
Echocardiogram 2D Echocardiogram has been performed.  Holly Figueroa 12/09/2019, 2:08 PM

## 2019-12-09 NOTE — Evaluation (Signed)
Physical Therapy Evaluation Patient Details Name: Holly Figueroa MRN: [id number] DOB: [birth year] Today's Date: 12/09/2019   History of Present Illness  54 y.o. female with medical history significant of 1, hyperlipidemia, diabetes mellitus, depression/anxiety, tobacco abuse, MVR on Coumadin therapy, GERD, obesity presents to emergency department due to worsening shortness of breath, cough, congestion and hypoxia.  Clinical Impression  Pt admitted with above diagnosis. Pt was able to ambulate to bathroom and back and DOE 3/4 with desaturation on RA to 83% needing 3LO2 to keep sats above 89%.  Will follow acutely and progress pt as able.  Pt currently with functional limitations due to the deficits listed below (see PT Problem List). Pt will benefit from skilled PT to increase their independence and safety with mobility to allow discharge to the venue listed below.      Follow Up Recommendations Home health PT;Supervision - Intermittent    Equipment Recommendations  Other (comment) (possibly rollator)    Recommendations for Other Services       Precautions / Restrictions Precautions Precautions: Fall Precaution Comments: monitor O2 Restrictions Weight Bearing Restrictions: No      Mobility  Bed Mobility Overal bed mobility: Modified Independent                Transfers Overall transfer level: Needs assistance Equipment used: None Transfers: Sit to/from Stand Sit to Stand: Supervision;Min guard         General transfer comment: Pt provided with steadying assist for balance  Ambulation/Gait Ambulation/Gait assistance: Min guard Gait Distance (Feet): 40 Feet (20 feet x 2) Assistive device: 1 person hand held assist;None Gait Pattern/deviations: Step-through pattern;Decreased stride length   Gait velocity interpretation: <1.31 ft/sec, indicative of household ambulator General Gait Details: Pt walked to bathroom and back to bed.  No LOB. Guarded gait overall as pt  slow. DOE 3/4 with O2 removed with desaturation and need to replace O2.   Stairs            Wheelchair Mobility    Modified Rankin (Stroke Patients Only)       Balance Overall balance assessment: Mild deficits observed, not formally tested                                           Pertinent Vitals/Pain Pain Assessment: No/denies pain    Home Living Family/patient expects to be discharged to:: Private residence Living Arrangements: Spouse/significant other Available Help at Discharge: Family Type of Home: House Home Access: Stairs to enter Entrance Stairs-Rails: Psychiatric nurse of Steps: 4 Home Layout: One level Home Equipment: None      Prior Function Level of Independence: Independent         Comments: wears 3.5L O2 at night     Hand Dominance   Dominant Hand: Right    Extremity/Trunk Assessment   Upper Extremity Assessment Upper Extremity Assessment: Defer to OT evaluation    Lower Extremity Assessment Lower Extremity Assessment: Overall WFL for tasks assessed    Cervical / Trunk Assessment Cervical / Trunk Assessment: Normal  Communication   Communication: No difficulties  Cognition Arousal/Alertness: Awake/alert (initially lethargic but more alert with activity) Behavior During Therapy: WFL for tasks assessed/performed Overall Cognitive Status: Within Functional Limits for tasks assessed  General Comments General comments (skin integrity, edema, etc.): HR 87 bpm, BP 126/65, O2 on RA with actibity 83%.  Pt 89% on 3L of O2 with activity.     Exercises     Assessment/Plan    PT Assessment Patient needs continued PT services  PT Problem List Decreased activity tolerance;Decreased balance;Decreased mobility;Decreased knowledge of use of DME;Decreased safety awareness;Decreased knowledge of precautions;Cardiopulmonary status limiting activity        PT Treatment Interventions DME instruction;Gait training;Functional mobility training;Therapeutic activities;Therapeutic exercise;Balance training;Stair training;Patient/family education    PT Goals (Current goals can be found in the Care Plan section)  Acute Rehab PT Goals Patient Stated Goal: "I feel so weak" PT Goal Formulation: With patient Time For Goal Achievement: 12/23/19 Potential to Achieve Goals: Good    Frequency Min 3X/week   Barriers to discharge        Co-evaluation               AM-PAC PT "6 Clicks" Mobility  Outcome Measure Help needed turning from your back to your side while in a flat bed without using bedrails?: None Help needed moving from lying on your back to sitting on the side of a flat bed without using bedrails?: None Help needed moving to and from a bed to a chair (including a wheelchair)?: None Help needed standing up from a chair using your arms (e.g., wheelchair or bedside chair)?: A Little Help needed to walk in hospital room?: A Little Help needed climbing 3-5 steps with a railing? : A Little 6 Click Score: 21    End of Session Equipment Utilized During Treatment: Gait belt;Oxygen Activity Tolerance: Patient limited by fatigue Patient left: with call bell/phone within reach;in bed;with bed alarm set Nurse Communication: Mobility status PT Visit Diagnosis: Unsteadiness on feet (R26.81);Muscle weakness (generalized) (M62.81);Other abnormalities of gait and mobility (R26.89)    Time: 6168-3729 PT Time Calculation (min) (ACUTE ONLY): 13 min   Charges:   PT Evaluation $PT Eval Moderate Complexity: 1 Mod          Audrina Marten W,PT Acute Rehabilitation Services Pager:  [phone]  Office:  [place] 12/09/2019, 1:56 PM

## 2019-12-09 NOTE — Plan of Care (Signed)

## 2019-12-09 NOTE — Progress Notes (Signed)
ANTICOAGULATION CONSULT NOTE -  Follow up  Pharmacy Consult for warfarin Indication: mechanical MVR, AVR  Allergies  Allergen Reactions  . Iodinated Diagnostic Agents Anaphylaxis  . Aspirin Other (See Comments)    "Possible blood in stool" the 342m. Can take 866m  . Ibuprofen Other (See Comments)    "Possible blood in stool"  . Sulfa Antibiotics Hives  . Zofran [Ondansetron Hcl]     Bad headaches  . Dilaudid [Hydromorphone Hcl] Itching and Palpitations  . Hydrocodone Palpitations    Patient Measurements: Height: _0  (167.6 cm) Weight: 80.5 kg (177 lb 7.5 oz) IBW/kg (Calculated) : 59.3 Heparin (IV) Dosing Weight: 76 kg  Vital Signs: Temp: 98.2 F (36.8 C) (06/16 0744) Temp Source: Oral (06/16 0744) BP: 101/45 (06/16 0748) Pulse Rate: 67 (06/16 0748)  Labs: Recent Labs    12/08/19 0958 12/08/19 0958 12/08/19 1247 12/08/19 1249 12/08/19 1501 12/08/19 1646 12/09/19 0554  HGB 9.0*   < >  --  9.2*  --   --  7.9*  HCT 29.0*  --   --  27.0*  --   --  25.3*  PLT 266  --   --   --   --   --  242  APTT  --   --   --   --  48*  --   --   LABPROT  --   --   --   --  16.7*  --  17.0*  INR  --   --   --   --  1.4*  --  1.4*  CREATININE 1.02*  --   --   --   --   --  1.04*  TROPONINIHS 16   < > 60*  --   --  241* 52*   < > = values in this interval not displayed.    Estimated Creatinine Clearance: 67.7 mL/min (A) (by C-G formula based on SCr of 1.04 mg/dL (H)).   Medical History: Past Medical History:  Diagnosis Date  . Acute respiratory failure ([facility]  . Anemia 08-2008   Blood transfusion  . Anxiety   . Benzodiazepine dependence ([facility]  . Cervical cancer ([facility]  . CHF (congestive heart failure) ([facility]  . Depression   . Diabetes mellitus without complication ([facility]  . Headache(784.0)    migraines  . Hypokalemia   . Legionella pneumonia ([facility]  . Leukocytosis, unspecified   . Mitral stenosis   . Panic attacks   . Tobacco abuse     Medications:   Scheduled:  . ALPRAZolam  0.5 mg Oral TID  . dextromethorphan-guaiFENesin  1 tablet Oral BID  . enoxaparin (LOVENOX) injection  80 mg Subcutaneous Q12H  . FLUoxetine  60 mg Oral Daily  . furosemide  40 mg Intravenous BID  . insulin aspart  0-15 Units Subcutaneous TID WC  . insulin aspart  0-5 Units Subcutaneous QHS  . metoprolol tartrate  50 mg Oral BID  . nicotine  14 mg Transdermal Daily  . nystatin  5 mL Oral QID  . pantoprazole  40 mg Oral QPC breakfast  . rosuvastatin  10 mg Oral QPC breakfast  . sodium chloride flush  3 mL Intravenous Q12H  . traZODone  100 mg Oral QHS  . Warfarin - Pharmacist Dosing Inpatient   Does not apply q1600    Assessment: 5270of with AVR, mechanical MVR on warfarin PTA. PTA dose 5 mg daily per CPhT med history. Last dose  6/13. INR subtherapeutic at 1.4 on admit.  INR today remains 1.4.  Patient reported last PTA warfarin dose taken 6/13,  missed dose 6/14 PTA.  Warfarin 77m dose was given on 12/08/19.  Lovenox 169mkg q12h bridge until INR therapeutic.  Weight updated to 80.5 kg. Will adjust Lovenox dose according to weight. CrCl ~ 67 ml/min. Baseline hgb 9.2, plts 266k.  Hgb down to 7.9, pltc wnl/stable.  No bleeding reported.    Goal of Therapy:  INR 2.5-3.5 Monitor platelets by anticoagulation protocol: Yes   Plan:  Adjust Enoxaparin to 80 mg (1 mg/kg) [place] Q12h until therapeutic INR   Warfarin 5 mg PO x1 tonight  Daily INR, CBC Monitor for bleeding    Thank you,  RuNicole CellaRPh Clinical Pharmacist [phone] check amion for clinical pharmacist contact number 12/09/2019,10:33 AM

## 2019-12-09 NOTE — Progress Notes (Addendum)
Pt BP 88/44 (56).  Patient is asymptomatic at this time.  MD notified.   0046-order placed for 250 bolus.  0010-MD at bedside, and verbal order to notify if MAP does not remain above 60 after bolus.

## 2019-12-09 NOTE — Progress Notes (Signed)
PROGRESS NOTE    Holly Figueroa  AOZ:[id number] DOB: [birth year] DOA: 12/08/2019 PCP: Glenda Chroman, MD    Brief Narrative: 54 year old female with history of diabetes, hyperlipidemia, tobacco abuse, COPD, depression and anxiety, mitral valve replacement on Coumadin, GERD, obstructive sleep apnea, admitted with shortness of breath cough congestion and was found to be severely hypoxic.  At baseline she uses 3 L of oxygen at night.  She was treated as an outpatient with doxycycline with some improvement she finished a course of 10 days of doxy.  She denies any fever. She reports using 8 pillows at night to sleep due to orthopnea. Chest x-ray in the ER showed diffuse pulmonary edema.  Assessment & Plan:   Principal Problem:   Acute on chronic respiratory failure with hypoxia (HCC) Active Problems:   Tobacco abuse   Macrocytic anemia   Gastroesophageal reflux disease   Chronic obstructive pulmonary disease (HCC)   CHF exacerbation (HCC)   Diabetes mellitus without complication (HCC)   Mitral stenosis   Elevated troponin   Thrush   Depression   Anxiety   1 acute on chronic hypoxic respiratory failure secondary to acute diastolic CHF exacerbation in the setting of COPD-she is admitted with shortness of breath cough orthopnea and hypoxia, BNP 319  she missed taking Lasix as she felt very weak. She is on Lasix 40 twice daily however her blood pressure is too soft, Echo today with ejection fraction 45 to 50% I's and O's negative by 1150 Weights no weights recorded Chest x-ray with pulmonary edema Patient afebrile no leukocytosis negative procalcitonin  Mildly elevated troponin 62-241-52 likely related to demand PT eval noted with desaturation to 83% with ambulation.  2 history of essential hypertension blood pressure 105/54  3 hyperlipidemia on statin  4 type 2 diabetes continue SSI.  On Metformin and glipizide at home.  A1c is 6.5 CBG (last 3)  Recent Labs    12/08/19 2131  12/09/19 0507 12/09/19 1115  GLUCAP 130* 122* 120*   5 chronic macrocytic anemia TSH b 12 and folate are normal.  6 status post AVR and mitral valve replacement on Coumadin INR 1.4  7 obstructive sleep apnea on oxygen at night at 3 L  8 tobacco abuse continue nicotine patch  9 thrush continue nystatin likely due to recent antibiotic use.  10 UTI ucx e coli continue rocephin sensitivity pending  11 hypokalemia/hypomagnesemia replete and recheck   Estimated body mass index is 28.64 kg/m as calculated from the following:   Height as of this encounter: _0  (1.676 m).   Weight as of this encounter: 80.5 kg.  DVT prophylaxis: Coumadin/Lovenox  code Status: Full code Family Communication: None at bedside Disposition Plan:  Status is: Inpatient  Dispo: The patient is from: Home              Anticipated d/c is to: Home              Anticipated d/c date is: 1 to 2 days              Patient currently is not medically stable to d/c.   Consultants:  Cardiology  Procedures: None Antimicrobials Rocephin  Subjective: She is resting in bed she reports that she feels better however she does not feel back to her baseline.  Ambulated with physical therapy desaturated to 83%  Objective: Vitals:   12/09/19 0748 12/09/19 1050 12/09/19 1157 12/09/19 1200  BP: (!) 101/45  (!) 106/57 (!) 105/54  Pulse:  67 67 65 65  Resp: _0 Temp:   98.4 F (36.9 C)   TempSrc:   Oral   SpO2: 99% 90%  92%  Weight:      Height:        Intake/Output Summary (Last 24 hours) at 12/09/2019 1528 Last data filed at 12/09/2019 0514 Gross per 24 hour  Intake 600 ml  Output 1750 ml  Net -1150 ml   Filed Weights   12/08/19 0950 12/08/19 1559 12/09/19 0514  Weight: 74.8 kg 82.5 kg 80.5 kg    Examination:  General exam: Appears calm and comfortable  Respiratory system: Clear to auscultation. Respiratory effort normal. Cardiovascular system: S1 & S2 heard, RRR. No JVD, murmurs, rubs, gallops  or clicks. No pedal edema. Gastrointestinal system: Abdomen is nondistended, soft and nontender. No organomegaly or masses felt. Normal bowel sounds heard. Central nervous system: Alert and oriented. No focal neurological deficits. Extremities: Symmetric 5 x 5 power. Skin: No rashes, lesions or ulcers Psychiatry: Judgement and insight appear normal. Mood & affect appropriate.     Data Reviewed: I have personally reviewed following labs and imaging studies  CBC: Recent Labs  Lab 12/08/19 0958 12/08/19 1249 12/09/19 0554  WBC 13.3*  --  8.8  HGB 9.0* 9.2* 7.9*  HCT 29.0* 27.0* 25.3*  MCV 100.7*  --  99.6  PLT 266  --  544   Basic Metabolic Panel: Recent Labs  Lab 12/08/19 0958 12/08/19 1249 12/08/19 1501 12/09/19 0554  NA 135 139  --  138  K 3.5 3.6  --  3.0*  CL 100  --   --  99  CO2 23  --   --  29  GLUCOSE 285*  --   --  127*  BUN 9  --   --  10  CREATININE 1.02*  --   --  1.04*  CALCIUM 8.7*  --   --  8.2*  MG  --   --  1.4*  --    GFR: Estimated Creatinine Clearance: 67.7 mL/min (A) (by C-G formula based on SCr of 1.04 mg/dL (H)). Liver Function Tests: No results for input(s): AST, ALT, ALKPHOS, BILITOT, PROT, ALBUMIN in the last 168 hours. No results for input(s): LIPASE, AMYLASE in the last 168 hours. No results for input(s): AMMONIA in the last 168 hours. Coagulation Profile: Recent Labs  Lab 12/08/19 1501 12/09/19 0554  INR 1.4* 1.4*   Cardiac Enzymes: No results for input(s): CKTOTAL, CKMB, CKMBINDEX, TROPONINI in the last 168 hours. BNP (last 3 results) No results for input(s): PROBNP in the last 8760 hours. HbA1C: Recent Labs    12/08/19 1500  HGBA1C 6.5*   CBG: Recent Labs  Lab 12/08/19 1557 12/08/19 2131 12/09/19 0507 12/09/19 1115  GLUCAP 261* 130* 122* 120*   Lipid Profile: No results for input(s): CHOL, HDL, LDLCALC, TRIG, CHOLHDL, LDLDIRECT in the last 72 hours. Thyroid Function Tests: Recent Labs    12/08/19 1501  TSH  0.654   Anemia Panel: Recent Labs    12/08/19 1501  VITAMINB12 330  FOLATE 7.9   Sepsis Labs: Recent Labs  Lab 12/08/19 1500 12/08/19 1501 12/08/19 1646  PROCALCITON  --  0.21  --   LATICACIDVEN 2.8*  --  1.7    Recent Results (from the past 240 hour(s))  SARS Coronavirus 2 by RT PCR (hospital order, performed in [facility] [facility] hospital lab) Nasopharyngeal Nasopharyngeal Swab     Status: None   Collection Time: 12/08/19  12:56 PM   Specimen: Nasopharyngeal Swab  Result Value Ref Range Status   SARS Coronavirus 2 NEGATIVE NEGATIVE Final    Comment: (NOTE) SARS-CoV-2 target nucleic acids are NOT DETECTED.  The SARS-CoV-2 RNA is generally detectable in upper and lower respiratory specimens during the acute phase of infection. The lowest concentration of SARS-CoV-2 viral copies this assay can detect is 250 copies / mL. A negative result does not preclude SARS-CoV-2 infection and should not be used as the sole basis for treatment or other patient management decisions.  A negative result may occur with improper specimen collection / handling, submission of specimen other than nasopharyngeal swab, presence of viral mutation(s) within the areas targeted by this assay, and inadequate number of viral copies (<250 copies / mL). A negative result must be combined with clinical observations, patient history, and epidemiological information.  Fact Sheet for Patients:   StrictlyIdeas.no  Fact Sheet for Healthcare Providers: BankingDealers.co.za  This test is not yet approved or  cleared by the [place] FDA and has been authorized for detection and/or diagnosis of SARS-CoV-2 by FDA under an Emergency Use Authorization (EUA).  This EUA will remain in effect (meaning this test can be used) for the duration of the COVID-19 declaration under Section 564(b)(1) of the Act, 21 U.S.C. section 360bbb-3(b)(1), unless the authorization is  terminated or revoked sooner.  Performed at [facility], [place] [address], [place], [place] [address]   Culture, blood (routine x 2)     Status: None (Preliminary result)   Collection Time: 12/08/19  3:05 PM   Specimen: BLOOD RIGHT HAND  Result Value Ref Range Status   Specimen Description BLOOD RIGHT HAND  Final   Special Requests   Final    BOTTLES DRAWN AEROBIC AND ANAEROBIC Blood Culture results may not be optimal due to an inadequate volume of blood received in culture bottles   Culture   Final    NO GROWTH < 24 HOURS Performed at [facility], [place] [address], [place], [place] [address]    Report Status PENDING  Incomplete  Culture, blood (routine x 2)     Status: None (Preliminary result)   Collection Time: 12/08/19  4:51 PM   Specimen: BLOOD LEFT ARM  Result Value Ref Range Status   Specimen Description BLOOD LEFT ARM  Final   Special Requests   Final    BOTTLES DRAWN AEROBIC ONLY Blood Culture adequate volume   Culture   Final    NO GROWTH < 24 HOURS Performed at [facility], [place] [address], [place], [place] [address]    Report Status PENDING  Incomplete  Culture, Urine     Status: Abnormal (Preliminary result)   Collection Time: 12/08/19  5:16 PM   Specimen: Urine, Random  Result Value Ref Range Status   Specimen Description URINE, RANDOM  Final   Special Requests NONE  Final   Culture (A)  Final    >=100,000 COLONIES/mL ESCHERICHIA COLI SUSCEPTIBILITIES TO FOLLOW Performed at [facility], [place] [address] [place], [place] [address]    Report Status PENDING  Incomplete         Radiology Studies: DG Chest 2 View  Result Date: 12/08/2019 CLINICAL DATA:  Chest pain and shortness of breath for 2 days. Hypoxia. EXAM: CHEST - 2 VIEW COMPARISON:  03/27/2019 FINDINGS: Stable mild cardiomegaly. 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 Diffuse interstitial and airspace disease shows mild increase since previous study, most  likely  due to diffuse pulmonary edema. No evidence of pleural effusion. IMPRESSION: Mild increase in diffuse interstitial and airspace disease, likely due to diffuse pulmonary edema. Electronically Signed   By: Marlaine Hind M.D.   On: 12/08/2019 10:24   ECHOCARDIOGRAM COMPLETE  Result Date: 12/09/2019    ECHOCARDIOGRAM REPORT   Patient Name:   Holly Figueroa Date of Exam: 12/09/2019 Medical Rec #:  [id number]           Height:       66.0 in Accession #:    [id number]          Weight:       177.5 lb Date of Birth:  06-29-1965            BSA:          1.901 m Patient Age:    81 years            BP:           101/54 mmHg Patient Gender: F                   HR:           63 bpm. Exam Location:  Inpatient Procedure: 2D Echo, Color Doppler and Cardiac Doppler Indications:    I50.9* Heart failure (unspecified)  History:        Patient has prior history of Echocardiogram examinations, most                 recent 03/28/2019. CHF, COPD; Risk Factors:Diabetes. 11/01/17                 Aortic Valve replaced with 93m Regent Bileaflet Mechanical                 Prosthetic and Mitral Valve replaced with 242mRegent Bileaflet                 Mechanical Prosthetic.                  Mitral Valve: 25 mm mechanical valve valve is present in the                 mitral position.  Sonographer:    EmRaquel Sarnaenior RDCS Referring Phys: [id number]. Left ventricular ejection fraction, by estimation, is 55 to 60%. The left ventricle has normal function. The left ventricle has no regional wall motion abnormalities. There is mild concentric left ventricular hypertrophy. Left ventricular diastolic parameters are indeterminate. Elevated left ventricular end-diastolic pressure.  2. Right ventricular systolic function is normal. The right ventricular size is normal. There is normal pulmonary artery systolic pressure. The estimated right ventricular systolic pressure is 3317.4mHg.  3. Left atrial size was mildly dilated.  4. The  mitral valve has been repaired/replaced. No evidence of mitral stenosis. There is a 25 mm mechanical valve present in the mitral position. Echo findings are consistent with normal structure and function of the mitral valve prosthesis. Mildly increased gradient across MV with MV peak gradient, 15.6 mmHg. The mean mitral valve gradient is 5 mmHg. Mitral regurgitation cannot be assessed due to LA shadowing from mechanical MV mitral valve regurgitation.  5. The aortic valve has been repaired/replaced. There is a mechanical valve present in the aortic position. Echo findings are consistent with normal structure and function of the aortic valve prosthesis. Trivial perivalvular AI is present. There is a mild gradient across the AV with mean  AVG 87mHg.  6. The inferior vena cava is normal in size with greater than 50% respiratory variability, suggesting right atrial pressure of 3 mmHg.  7. COmpared to echo of 03/2019, the mean MVG of MV mechanical prosthesis has improved from 7 to 534mg. The mean AVG of the AV mechanical prosthesis has also improved from 29 to 2538m. FINDINGS  Left Ventricle: Left ventricular ejection fraction, by estimation, is 55 to 60%. The left ventricle has normal function. The left ventricle has no regional wall motion abnormalities. The left ventricular internal cavity size was normal in size. There is  mild concentric left ventricular hypertrophy. Left ventricular diastolic parameters are indeterminate. Elevated left ventricular end-diastolic pressure. Right Ventricle: The right ventricular size is normal. No increase in right ventricular wall thickness. Right ventricular systolic function is normal. There is normal pulmonary artery systolic pressure. The tricuspid regurgitant velocity is 2.74 m/s, and  with an assumed right atrial pressure of 3 mmHg, the estimated right ventricular systolic pressure is 33.41.2Hg. Left Atrium: Left atrial size was mildly dilated. Right Atrium: Right atrial size  was normal in size. Pericardium: There is no evidence of pericardial effusion. Mitral Valve: The mitral valve has been repaired/replaced. Mitral regurgitation cannot be assessed due to LA shadowing from mechanical MV mitral valve regurgitation. There is a 25 mm mechanical valve present in the mitral position. Echo findings are consistent with normal structure and function of the mitral valve prosthesis. No evidence of mitral valve stenosis. MV peak gradient, 15.6 mmHg. The mean mitral valve gradient is 5.0 mmHg. Tricuspid Valve: The tricuspid valve is normal in structure. Tricuspid valve regurgitation is mild . No evidence of tricuspid stenosis. Aortic Valve: The aortic valve has been repaired/replaced. Aortic valve regurgitation Trivial perivalvular AI. No aortic stenosis is present. Aortic valve mean gradient measures 25.0 mmHg. Aortic valve peak gradient measures 52.1 mmHg. Aortic valve area,  by VTI measures 0.95 cm. There is a mechanical valve present in the aortic position. Echo findings are consistent with normal structure and function of the aortic valve prosthesis. Pulmonic Valve: The pulmonic valve was normal in structure. Pulmonic valve regurgitation is not visualized. No evidence of pulmonic stenosis. Aorta: The aortic root is normal in size and structure. Venous: The inferior vena cava is normal in size with greater than 50% respiratory variability, suggesting right atrial pressure of 3 mmHg. IAS/Shunts: No atrial level shunt detected by color flow Doppler.  LEFT VENTRICLE PLAX 2D LVIDd:         3.50 cm  Diastology LVIDs:         2.50 cm  LV e' lateral:   5.44 cm/s LV PW:         1.40 cm  LV E/e' lateral: 33.6 LV IVS:        1.20 cm  LV e' medial:    4.79 cm/s LVOT diam:     1.70 cm  LV E/e' medial:  38.2 LV SV:         75 LV SV Index:   40 LVOT Area:     2.27 cm  RIGHT VENTRICLE RV S prime:     5.55 cm/s TAPSE (M-mode): 1.6 cm LEFT ATRIUM             Index       RIGHT ATRIUM           Index LA Vol  (A2C):   65.5 ml 34.46 ml/m RA Area:     16.70 cm LA Vol (A4C):  77.0 ml 40.51 ml/m RA Volume:   45.40 ml  23.89 ml/m LA Biplane Vol: 72.4 ml 38.09 ml/m  AORTIC VALVE AV Area (Vmax):    0.76 cm AV Area (Vmean):   0.88 cm AV Area (VTI):     0.95 cm AV Vmax:           361.00 cm/s AV Vmean:          229.000 cm/s AV VTI:            0.797 m AV Peak Grad:      52.1 mmHg AV Mean Grad:      25.0 mmHg LVOT Vmax:         121.00 cm/s LVOT Vmean:        89.200 cm/s LVOT VTI:          0.332 m LVOT/AV VTI ratio: 0.42  AORTA Ao Asc diam: 2.60 cm MITRAL VALVE                TRICUSPID VALVE MV Area (PHT): 3.48 cm     TR Peak grad:   30.0 mmHg MV Peak grad:  15.6 mmHg    TR Vmax:        274.00 cm/s MV Mean grad:  5.0 mmHg MV Vmax:       1.98 m/s     SHUNTS MV Vmean:      93.0 cm/s    Systemic VTI:  0.33 m MV Decel Time: 218 msec     Systemic Diam: 1.70 cm MV E velocity: 183.00 cm/s MV A velocity: 52.80 cm/s MV E/A ratio:  3.47 Fransico Him MD Electronically signed by Fransico Him MD Signature Date/Time: 12/09/2019/2:53:13 PM    Final         Scheduled Meds: . ALPRAZolam  0.5 mg Oral TID  . dextromethorphan-guaiFENesin  1 tablet Oral BID  . enoxaparin (LOVENOX) injection  80 mg Subcutaneous Q12H  . FLUoxetine  60 mg Oral Daily  . furosemide  40 mg Intravenous BID  . insulin aspart  0-15 Units Subcutaneous TID WC  . insulin aspart  0-5 Units Subcutaneous QHS  . metoprolol tartrate  50 mg Oral BID  . nicotine  14 mg Transdermal Daily  . nystatin  5 mL Oral QID  . pantoprazole  40 mg Oral QPC breakfast  . rosuvastatin  10 mg Oral QPC breakfast  . sodium chloride flush  3 mL Intravenous Q12H  . traZODone  100 mg Oral QHS  . warfarin  5 mg Oral ONCE-1600  . Warfarin - Pharmacist Dosing Inpatient   Does not apply q1600   Continuous Infusions: . sodium chloride    . cefTRIAXone (ROCEPHIN)  IV 1 g (12/08/19 1644)     LOS: 1 day     Georgette Shell, MD 12/09/2019, 3:28 PM

## 2019-12-10 ENCOUNTER — Encounter (HOSPITAL_COMMUNITY)
Admission: EM | Disposition: A | Discharge status: Home / Self Care | Payer: Self-pay | Source: Home / Self Care | Attending: Internal Medicine

## 2019-12-10 ENCOUNTER — Encounter (HOSPITAL_COMMUNITY): Payer: Self-pay | Admitting: Internal Medicine

## 2019-12-10 ENCOUNTER — Inpatient Hospital Stay (HOSPITAL_COMMUNITY): Payer: Medicare PPO | Admitting: Certified Registered Nurse Anesthetist

## 2019-12-10 ENCOUNTER — Inpatient Hospital Stay (HOSPITAL_COMMUNITY): Payer: Medicare PPO

## 2019-12-10 HISTORY — PX: RIGHT HEART CATH AND CORONARY ANGIOGRAPHY: CATH118264

## 2019-12-10 HISTORY — PX: TEE WITHOUT CARDIOVERSION: SHX5443

## 2019-12-10 LAB — URINE CULTURE: Culture: >=100000 — AB

## 2019-12-10 LAB — POCT I-STAT EG7
Acid-Base Excess: 3 mmol/L — ABNORMAL HIGH (ref 0.0–2.0)
Acid-Base Excess: 3 mmol/L — ABNORMAL HIGH (ref 0.0–2.0)
Bicarbonate: 29.1 mmol/L — ABNORMAL HIGH (ref 20.0–28.0)
Bicarbonate: 29.5 mmol/L — ABNORMAL HIGH (ref 20.0–28.0)
Calcium, Ion: 1.22 mmol/L (ref 1.15–1.40)
Calcium, Ion: 1.24 mmol/L (ref 1.15–1.40)
HCT: 26 % — ABNORMAL LOW (ref 36.0–46.0)
HCT: 27 % — ABNORMAL LOW (ref 36.0–46.0)
Hemoglobin: 8.8 g/dL — ABNORMAL LOW (ref 12.0–15.0)
Hemoglobin: 9.2 g/dL — ABNORMAL LOW (ref 12.0–15.0)
O2 Saturation: 66 %
O2 Saturation: 68 %
Potassium: 4.8 mmol/L (ref 3.5–5.1)
Potassium: 4.8 mmol/L (ref 3.5–5.1)
Sodium: 141 mmol/L (ref 135–145)
Sodium: 141 mmol/L (ref 135–145)
TCO2: 31 mmol/L (ref 22–32)
TCO2: 31 mmol/L (ref 22–32)
pCO2, Ven: 53.6 mmHg (ref 44.0–60.0)
pCO2, Ven: 54.3 mmHg (ref 44.0–60.0)
pH, Ven: 7.342 (ref 7.250–7.430)
pH, Ven: 7.343 (ref 7.250–7.430)
pO2, Ven: 37 mmHg (ref 32.0–45.0)
pO2, Ven: 38 mmHg (ref 32.0–45.0)

## 2019-12-10 LAB — POCT I-STAT 7, (LYTES, BLD GAS, ICA,H+H)
Acid-Base Excess: 4 mmol/L — ABNORMAL HIGH (ref 0.0–2.0)
Bicarbonate: 29.9 mmol/L — ABNORMAL HIGH (ref 20.0–28.0)
Calcium, Ion: 1.21 mmol/L (ref 1.15–1.40)
HCT: 27 % — ABNORMAL LOW (ref 36.0–46.0)
Hemoglobin: 9.2 g/dL — ABNORMAL LOW (ref 12.0–15.0)
O2 Saturation: 99 %
Potassium: 4.8 mmol/L (ref 3.5–5.1)
Sodium: 141 mmol/L (ref 135–145)
TCO2: 31 mmol/L (ref 22–32)
pCO2 arterial: 52.4 mmHg — ABNORMAL HIGH (ref 32.0–48.0)
pH, Arterial: 7.364 (ref 7.350–7.450)
pO2, Arterial: 131 mmHg — ABNORMAL HIGH (ref 83.0–108.0)

## 2019-12-10 LAB — CBC
HCT: 25.8 % — ABNORMAL LOW (ref 36.0–46.0)
Hemoglobin: 8 g/dL — ABNORMAL LOW (ref 12.0–15.0)
MCH: 30.8 pg (ref 26.0–34.0)
MCHC: 31 g/dL (ref 30.0–36.0)
MCV: 99.2 fL (ref 80.0–100.0)
Platelets: 291 10*3/uL (ref 150–400)
RBC: 2.6 MIL/uL — ABNORMAL LOW (ref 3.87–5.11)
RDW: 14.5 % (ref 11.5–15.5)
WBC: 6.7 10*3/uL (ref 4.0–10.5)
nRBC: 0 % (ref 0.0–0.2)

## 2019-12-10 LAB — GLUCOSE, CAPILLARY
Glucose-Capillary: 100 mg/dL — ABNORMAL HIGH (ref 70–99)
Glucose-Capillary: 131 mg/dL — ABNORMAL HIGH (ref 70–99)
Glucose-Capillary: 89 mg/dL (ref 70–99)
Glucose-Capillary: 134 mg/dL — ABNORMAL HIGH (ref 70–99)
Glucose-Capillary: 241 mg/dL — ABNORMAL HIGH (ref 70–99)

## 2019-12-10 LAB — PROTIME-INR
INR: 1.5 — ABNORMAL HIGH (ref 0.8–1.2)
Prothrombin Time: 17.9 seconds — ABNORMAL HIGH (ref 11.4–15.2)

## 2019-12-10 LAB — BASIC METABOLIC PANEL
Anion gap: 10 (ref 5–15)
BUN: 17 mg/dL (ref 6–20)
CO2: 27 mmol/L (ref 22–32)
Calcium: 8.6 mg/dL — ABNORMAL LOW (ref 8.9–10.3)
Chloride: 101 mmol/L (ref 98–111)
Creatinine, Ser: 1.34 mg/dL — ABNORMAL HIGH (ref 0.44–1.00)
GFR calc Af Amer: 53 mL/min — ABNORMAL LOW (ref >60)
GFR calc non Af Amer: 45 mL/min — ABNORMAL LOW (ref >60)
Glucose, Bld: 101 mg/dL — ABNORMAL HIGH (ref 70–99)
Potassium: 5 mmol/L (ref 3.5–5.1)
Sodium: 138 mmol/L (ref 135–145)

## 2019-12-10 LAB — MAGNESIUM: Magnesium: 2.4 mg/dL (ref 1.7–2.4)

## 2019-12-10 SURGERY — RIGHT HEART CATH AND CORONARY ANGIOGRAPHY
Anesthesia: LOCAL

## 2019-12-10 SURGERY — ECHOCARDIOGRAM, TRANSESOPHAGEAL
Anesthesia: General

## 2019-12-10 MED ORDER — VERAPAMIL HCL 2.5 MG/ML IV SOLN
INTRAVENOUS | Status: DC | PRN
  Administered 2019-12-10: 10 mL via INTRA_ARTERIAL

## 2019-12-10 MED ORDER — DIPHENHYDRAMINE HCL 50 MG/ML IJ SOLN: 25.0000 mg | Freq: Once | INTRAMUSCULAR | Status: DC

## 2019-12-10 MED ORDER — WARFARIN SODIUM 7.5 MG PO TABS
7.5000 mg | ORAL_TABLET | Freq: Once | ORAL | Status: AC
  Administered 2019-12-10: 7.5 mg via ORAL
  Filled 2019-12-10: qty 1

## 2019-12-10 MED ORDER — HEPARIN SODIUM (PORCINE) 1000 UNIT/ML IJ SOLN
INTRAMUSCULAR | Status: AC
  Filled 2019-12-10: qty 1

## 2019-12-10 MED ORDER — SODIUM CHLORIDE 0.9 % IV SOLN: INTRAVENOUS | Status: DC

## 2019-12-10 MED ORDER — SODIUM CHLORIDE 0.9 % IV SOLN
INTRAVENOUS | Status: AC | PRN
  Administered 2019-12-10: 500 mL via INTRAVENOUS

## 2019-12-10 MED ORDER — SODIUM CHLORIDE 0.9% FLUSH
3.0000 mL | Freq: Two times a day (BID) | INTRAVENOUS | Status: DC
  Administered 2019-12-11 – 2019-12-12 (×2): 3 mL via INTRAVENOUS

## 2019-12-10 MED ORDER — MIDAZOLAM HCL 2 MG/2ML IJ SOLN
INTRAMUSCULAR | Status: AC
  Filled 2019-12-10: qty 2

## 2019-12-10 MED ORDER — LABETALOL HCL 5 MG/ML IV SOLN: 10.0000 mg | INTRAVENOUS | Status: AC | PRN

## 2019-12-10 MED ORDER — VERAPAMIL HCL 2.5 MG/ML IV SOLN
INTRAVENOUS | Status: AC
  Filled 2019-12-10: qty 2

## 2019-12-10 MED ORDER — IOHEXOL 350 MG/ML SOLN
INTRAVENOUS | Status: DC | PRN
  Administered 2019-12-10: 40 mL

## 2019-12-10 MED ORDER — HEPARIN (PORCINE) IN NACL 1000-0.9 UT/500ML-% IV SOLN
INTRAVENOUS | Status: DC | PRN
  Administered 2019-12-10 (×2): 500 mL

## 2019-12-10 MED ORDER — LIDOCAINE HCL (PF) 1 % IJ SOLN
INTRAMUSCULAR | Status: AC
  Filled 2019-12-10: qty 30

## 2019-12-10 MED ORDER — METHYLPREDNISOLONE SODIUM SUCC 125 MG IJ SOLR: 125.0000 mg | Freq: Once | INTRAMUSCULAR | Status: DC

## 2019-12-10 MED ORDER — LIDOCAINE HCL (PF) 1 % IJ SOLN
INTRAMUSCULAR | Status: DC | PRN
  Administered 2019-12-10 (×2): 2 mL via INTRADERMAL

## 2019-12-10 MED ORDER — HEPARIN (PORCINE) IN NACL 1000-0.9 UT/500ML-% IV SOLN
INTRAVENOUS | Status: AC
  Filled 2019-12-10: qty 500

## 2019-12-10 MED ORDER — SODIUM CHLORIDE 0.9 % IV SOLN: 250.0000 mL | INTRAVENOUS | Status: DC | PRN

## 2019-12-10 MED ORDER — ONDANSETRON HCL 4 MG/2ML IJ SOLN
4.0000 mg | Freq: Four times a day (QID) | INTRAMUSCULAR | Status: DC | PRN
  Administered 2019-12-11: 4 mg via INTRAVENOUS
  Filled 2019-12-10: qty 2

## 2019-12-10 MED ORDER — SODIUM CHLORIDE 0.9% FLUSH: 3.0000 mL | INTRAVENOUS | Status: DC | PRN

## 2019-12-10 MED ORDER — FENTANYL CITRATE (PF) 100 MCG/2ML IJ SOLN
INTRAMUSCULAR | Status: DC | PRN
  Administered 2019-12-10: 50 ug via INTRAVENOUS

## 2019-12-10 MED ORDER — FUROSEMIDE 10 MG/ML IJ SOLN
40.0000 mg | Freq: Once | INTRAMUSCULAR | Status: AC
  Administered 2019-12-10: 40 mg via INTRAVENOUS
  Filled 2019-12-10: qty 4

## 2019-12-10 MED ORDER — MIDAZOLAM HCL 2 MG/2ML IJ SOLN
INTRAMUSCULAR | Status: DC | PRN
  Administered 2019-12-10: 1 mg via INTRAVENOUS

## 2019-12-10 MED ORDER — METHYLPREDNISOLONE SODIUM SUCC 125 MG IJ SOLR
125.0000 mg | Freq: Once | INTRAMUSCULAR | Status: AC
  Administered 2019-12-10: 125 mg via INTRAVENOUS
  Filled 2019-12-10: qty 2

## 2019-12-10 MED ORDER — PROPOFOL 500 MG/50ML IV EMUL
INTRAVENOUS | Status: DC | PRN
  Administered 2019-12-10: 100 ug/kg/min via INTRAVENOUS

## 2019-12-10 MED ORDER — PROPOFOL 10 MG/ML IV BOLUS
INTRAVENOUS | Status: DC | PRN
  Administered 2019-12-10: 20 mg via INTRAVENOUS
  Administered 2019-12-10: 30 mg via INTRAVENOUS
  Administered 2019-12-10: 20 mg via INTRAVENOUS

## 2019-12-10 MED ORDER — SODIUM CHLORIDE 0.9 % WEIGHT BASED INFUSION: 1.0000 mL/kg/h | INTRAVENOUS | Status: DC

## 2019-12-10 MED ORDER — DIPHENHYDRAMINE HCL 50 MG/ML IJ SOLN
25.0000 mg | Freq: Once | INTRAMUSCULAR | Status: AC
  Administered 2019-12-10: 25 mg via INTRAVENOUS
  Filled 2019-12-10: qty 1

## 2019-12-10 MED ORDER — ACETAMINOPHEN 325 MG PO TABS: 650.0000 mg | ORAL_TABLET | ORAL | Status: DC | PRN

## 2019-12-10 MED ORDER — SODIUM CHLORIDE 0.9 % WEIGHT BASED INFUSION
3.0000 mL/kg/h | INTRAVENOUS | Status: AC
  Administered 2019-12-10: 3 mL/kg/h via INTRAVENOUS

## 2019-12-10 MED ORDER — SODIUM CHLORIDE 0.9% FLUSH
3.0000 mL | Freq: Two times a day (BID) | INTRAVENOUS | Status: DC
  Administered 2019-12-12: 3 mL via INTRAVENOUS

## 2019-12-10 MED ORDER — HYDRALAZINE HCL 20 MG/ML IJ SOLN: 10.0000 mg | INTRAMUSCULAR | Status: AC | PRN

## 2019-12-10 MED ORDER — FENTANYL CITRATE (PF) 100 MCG/2ML IJ SOLN
INTRAMUSCULAR | Status: AC
  Filled 2019-12-10: qty 2

## 2019-12-10 SURGICAL SUPPLY — 13 items
CATH 5FR JL3.5 JR4 ANG PIG MP (CATHETERS) ×1 IMPLANT
CATH BALLN WEDGE 5F 110CM (CATHETERS) ×1 IMPLANT
CATH INFINITI JR4 5F (CATHETERS) ×1 IMPLANT
CATH OPTITORQUE TIG 4.0 5F (CATHETERS) ×1 IMPLANT
DEVICE RAD COMP TR BAND LRG (VASCULAR PRODUCTS) ×1 IMPLANT
GLIDESHEATH SLEND SS 6F .021 (SHEATH) ×1 IMPLANT
GUIDEWIRE INQWIRE 1.5J.035X260 (WIRE) IMPLANT
INQWIRE 1.5J .035X260CM (WIRE) ×2
KIT HEART LEFT (KITS) ×2 IMPLANT
PACK CARDIAC CATHETERIZATION (CUSTOM PROCEDURE TRAY) ×2 IMPLANT
SHEATH GLIDE SLENDER 4/5FR (SHEATH) ×1 IMPLANT
TRANSDUCER W/STOPCOCK (MISCELLANEOUS) ×2 IMPLANT
TUBING CIL FLEX 10 FLL-RA (TUBING) ×2 IMPLANT

## 2019-12-10 NOTE — H&P (View-Only) (Signed)
Subjective:  She is feeling much improved, orthopnea has also improved.  No chest pain or palpitations.  Intake/Output from previous day:  I/O last 3 completed shifts: In: 18 [P.O.:480; I.V.:250; IV Piggyback:100] Out: 1950 [Urine:1950] Total I/O In: -  Out: 700 [Urine:700]  Blood pressure (!) 95/37, pulse (!) 59, temperature 97.9 F (36.6 C), temperature source Oral, resp. rate 19, height _0  (1.676 m), weight 79.9 kg, SpO2 100 %. Physical Exam Cardiovascular:     Rate and Rhythm: Normal rate and regular rhythm.     Pulses: Intact distal pulses.     Heart sounds: Murmur heard.  Harsh midsystolic murmur is present with a grade of 3/6 at the upper right sternal border radiating to the neck.  No gallop.      Comments: Crisp mechanical S1 and S2 heard. No leg edema, no JVD. Pulmonary:     Effort: Pulmonary effort is normal.     Breath sounds: Normal breath sounds.  Abdominal:     General: Bowel sounds are normal.     Palpations: Abdomen is soft.    Lab Results: BMP BNP (last 3 results) Recent Labs    03/28/19 0248 12/08/19 1230  BNP 162.0* 319.5*    ProBNP (last 3 results) No results for input(s): PROBNP in the last 8760 hours. BMP Latest Ref Rng & Units 12/10/2019 12/09/2019 12/08/2019  Glucose 70 - 99 mg/dL 101(H) 127(H) -  BUN 6 - 20 mg/dL 17 10 -  Creatinine 0.44 - 1.00 mg/dL 1.34(H) 1.04(H) -  Sodium 135 - 145 mmol/L 138 138 139  Potassium 3.5 - 5.1 mmol/L 5.0 3.0(L) 3.6  Chloride 98 - 111 mmol/L 101 99 -  CO2 22 - 32 mmol/L 27 29 -  Calcium 8.9 - 10.3 mg/dL 8.6(L) 8.2(L) -   Hepatic Function Latest Ref Rng & Units 03/28/2019 08/24/2017 08/21/2017  Total Protein 6.5 - 8.1 g/dL 7.3 7.1 7.5  Albumin 3.5 - 5.0 g/dL 3.3(L) 3.1(L) 3.2(L)  AST 15 - 41 U/L 22 48(H) 21  ALT 0 - 44 U/L 23 42 21  Alk Phosphatase 38 - 126 U/L 117 76 78  Total Bilirubin 0.3 - 1.2 mg/dL 0.7 0.7 0.4  Bilirubin, Direct 0.1 - 0.5 mg/dL - - -   CBC Latest Ref Rng & Units 12/10/2019 12/09/2019  12/08/2019  WBC 4.0 - 10.5 K/uL 6.7 8.8 -  Hemoglobin 12.0 - 15.0 g/dL 8.0(L) 7.9(L) 9.2(L)  Hematocrit 36 - 46 % 25.8(L) 25.3(L) 27.0(L)  Platelets 150 - 400 K/uL 291 242 -   Lipid Panel     Component Value Date/Time   CHOL 98 08/20/2017 0524   TRIG 121 09/09/2017 1540   HDL 34 (L) 08/20/2017 0524   CHOLHDL 2.9 08/20/2017 0524   VLDL 22 08/20/2017 0524   LDLCALC 42 08/20/2017 0524   Cardiac Panel (last 3 results) No results for input(s): CKTOTAL, CKMB, TROPONINI, RELINDX in the last 72 hours.  HEMOGLOBIN A1C Lab Results  Component Value Date   HGBA1C 6.5 (H) 12/08/2019   MPG 139.85 12/08/2019   TSH Recent Labs    03/28/19 0240 12/08/19 1501  TSH 0.415 0.654   Imaging: DG Chest 2 View  Result Date: 12/08/2019 CLINICAL DATA:  Chest pain and shortness of breath for 2 days. Hypoxia. EXAM: CHEST - 2 VIEW COMPARISON:  03/27/2019 FINDINGS: Stable mild cardiomegaly. 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 Diffuse interstitial and airspace disease shows mild increase since previous study, most likely due to diffuse pulmonary edema. No  evidence of pleural effusion. IMPRESSION: Mild increase in diffuse interstitial and airspace disease, likely due to diffuse pulmonary edema. Electronically Signed   By: Marlaine Hind M.D.   On: 12/08/2019 10:24  Cardiac Studies: Valve replacement at [facility] 04/03/2019: Mechanical prosthetic AV 71m ST JUDE REGENT STANDARD MECH PROSTHETIC MV: 232mST JUDE REGENT STANDARD   [place] NORMAL RIGHT VENTRICULAR SYSTOLIC FUNCTION VALVULAR REGURGITATION: TRIVIAL PR, MILD TR PROSTHETIC VALVE(S): MECH PROSTHETIC AoV, MECH PROSTHETIC, peak velocity 3.5 m/s, peak pressure gradient 48 and mean pressure gradient 25 mmHg.  Dimensionless index 0.49. ESTIMATED RVSP AT LEAST 309m THERE APPEARS TO BE AT LEAST MILD MR, THOUGH MECHANICAL MITRAL VALVE MV, peak 3m74mand mean 7 mmHg. PREVENTS  ACCURATE ASSESSMENT. PA ACCELERATION TIME OF 60 msec IS CONSISTENT WITH ELEVATED PA PRESSURES. ESTIMATED PA MEAN IS 53mm30m RHC 1Arma2/20 State: Baseline RA: 14 mmHg (mean) RV: 54/ 11 mmHg PA: 54/ 25 36 mmHg (mean) PCW: 22 mmHg (mean) AV O2: 3.8 vol% Cardiac output: 6.0 L/min Cardiac index: 3.1 L/min-m2 PVR: 2.3 Wood units _____________________  Echocardiogram 12/09/2019: 1. Left ventricular ejection fraction, by estimation, is 55 to 60%. The  left ventricle has normal function. The left ventricle has no regional  wall motion abnormalities. There is mild concentric left ventricular  hypertrophy. Left ventricular diastolic  parameters are indeterminate. Elevated left ventricular end-diastolic pressure.  2. Right ventricular systolic function is normal. The right ventricular  size is normal. There is normal pulmonary artery systolic pressure. The  estimated right ventricular systolic pressure is 33.0 03.1.  3. Left atrial size was mildly dilated.  4. The mitral valve has been repaired/replaced. No evidence of mitral  stenosis. There is a 25 mm mechanical valve present in the mitral  position. Echo findings are consistent with normal structure and function  of the mitral valve prosthesis. Mildly  increased gradient across MV with MV peak gradient, 15.6 mmHg. The mean  mitral valve gradient is 5 mmHg. Mitral regurgitation cannot be assessed  due to LA shadowing from mechanical MV mitral valve regurgitation.  5. The aortic valve has been repaired/replaced. There is a mechanical  valve present in the aortic position. Echo findings are consistent with  normal structure and function of the aortic valve prosthesis. Trivial  perivalvular AI is present. There is a  mild gradient across the AV with mean AVG 25mmH23m6. The inferior vena cava is normal in size with greater than 50%  respiratory variability, suggesting right atrial pressure of 3 mmHg.  7. Compared to echo of 03/2019, the mean  MVG of MV mechanical prosthesis  has improved from 7 to 5mmHg.57me mean AVG of the AV mechanical prosthesis  has also improved from 29 to 25mmHg.67mG: EKG 12/08/2019: Normal sinus rhythm at rate of 83 bpm, left atrial enlargement, normal axis.  No evidence of ischemia.  Scheduled Meds: . ALPRAZolam  0.5 mg Oral TID  . dextromethorphan-guaiFENesin  1 tablet Oral BID  . enoxaparin (LOVENOX) injection  80 mg Subcutaneous Q12H  . FLUoxetine  60 mg Oral Daily  . furosemide  40 mg Intravenous Q8H  . insulin aspart  0-15 Units Subcutaneous TID WC  . insulin aspart  0-5 Units Subcutaneous QHS  . metoprolol tartrate  50 mg Oral BID  . nystatin  5 mL Oral QID  . pantoprazole  40 mg Oral QPC breakfast  . potassium chloride  40 mEq Oral TID  . rosuvastatin  10 mg Oral QPC breakfast  . sacubitril-valsartan  1 tablet Oral BID  . sodium chloride flush  3 mL Intravenous Q12H  . sodium chloride flush  3 mL Intravenous Q12H  . traZODone  100 mg Oral QHS  . Warfarin - Pharmacist Dosing Inpatient   Does not apply q1600   Continuous Infusions: . sodium chloride     PRN Meds:.sodium chloride, acetaminophen, ipratropium-albuterol, sodium chloride flush  Assessment/Plan:  Holly Figueroa  is a 54 y.o. female with a hx of mitral stenosis and aortic stenosis s/p mMVR and mAVR (Dr. Lincoln Brigham, 11/01/2017), HTN, COPD, DM2, IDA, GERD, prior tobacco use disorder quit in May 2019 after valve surgery, OSA unable to tolerate CPAP and presently on nocturnal O2 therapy who has had multiple outpatient evaluations and also recent hospitalization with recurrent pulmonary edema.  I had not seen her for almost 2 years.  Since I had last seen her, she has undergone mitral valve and aortic valve replacement.  Postoperative course was complicated by sternal dehiscence and wound infection, recurrent pulmonary edema, paroxysmal atrial fibrillation and recurrent congestive heart failure.  She has had multiple hospitalizations and  office visits due to continued dyspnea, presently sleeps on 6 pillows.  1.  Recurrent acute pulmonary edema 2.  History of mechanical aortic and mitral valve replacement on 04/03/2019  AV 26m ST JUDE REGENT STANDARD MECH PROSTHETIC MV: 244mST JUDE REGENT STANDARD 3.  Mechanical aortic valve dysfunction with moderate aortic valve stenosis, peak gradient close to 4 m/s with a mean gradient of 25 mmHg. 4.  Hypertension 5.  Long-term anticoagulation with warfarin 6. Essential hypertension 7. OSA not on CPAP and presently on home O2. 8. Acute renal insufficiency due to over diuresis.  Rec patient with recurrent pulmonary edema, I cannot clearly see the aortic valve, although in some views appears to open well, cannot explain 4 m jet across the prosthetic aortic valve.  She will need left and right heart catheterization, patient has already been scheduled for TEE.  Schedule for cardiac catheterization, and possible angioplasty. We discussed regarding risks, benefits, alternatives to this including stress testing, CTA and continued medical therapy. Patient wants to proceed. Understands <1-2% risk of death, stroke, MI, urgent CABG, bleeding, infection, renal failure but not limited to these.   Patient is aware of risk of TEE including aspiration pneumonia and esophageal perforation, trauma and bleeding.  We will hold her Entresto this morning, will also hold this morning dose of furosemide.  We will make further recommendations after the catheterization.   JaAdrian ProwsM.D. 12/10/2019, 9:44 AM [facility] Pager: [phone] Office: [phone] no answer: [phone]

## 2019-12-10 NOTE — Progress Notes (Addendum)
Cath lab called and notified pt TEE completed, state they will not be ready for pt until 1530

## 2019-12-10 NOTE — Progress Notes (Signed)
Pt prefers to use O2 at night instead of CPAP. Advised pt to notify for RT if she changes her mind.

## 2019-12-10 NOTE — Transfer of Care (Signed)
Immediate Anesthesia Transfer of Care Note  Patient: Milford Cage Voorhis  Procedure(s) Performed: TRANSESOPHAGEAL ECHOCARDIOGRAM (TEE) (N/A )  Patient Location: PACU and Endoscopy Unit  Anesthesia Type:MAC  Level of Consciousness: awake and alert   Airway & Oxygen Therapy: Patient Spontanous Breathing and Patient connected to nasal cannula oxygen  Post-op Assessment: Report given to RN and Post -op Vital signs reviewed and stable  Post vital signs: Reviewed and stable  Last Vitals:  Vitals Value Taken Time  BP 113/49 12/10/19 1355  Temp    Pulse 71 12/10/19 1356  Resp 19 12/10/19 1356  SpO2 89 % 12/10/19 1356  Vitals shown include unvalidated device data.  Last Pain:  Vitals:   12/10/19 1139  TempSrc: Oral  PainSc: 0-No pain      Patients Stated Pain Goal: 0 (72/82/06 0156)  Complications: No complications documented.

## 2019-12-10 NOTE — Interval H&P Note (Signed)
History and Physical Interval Note:  12/10/2019 12:29 PM  Holly Figueroa  has presented today for surgery, with the diagnosis of aortic stenosis.  The various methods of treatment have been discussed with the patient and family. After consideration of risks, benefits and other options for treatment, the patient has consented to  Procedure(s): TRANSESOPHAGEAL ECHOCARDIOGRAM (TEE) (N/A) as a surgical intervention.  The patient's history has been reviewed, patient examined, no change in status, stable for surgery.  I have reviewed the patient's chart and labs.  Questions were answered to the patient's satisfaction.     [place]

## 2019-12-10 NOTE — Interval H&P Note (Signed)
History and Physical Interval Note:  12/10/2019 3:27 PM  Holly Figueroa  has presented today for surgery, with the diagnosis of [id number].  The various methods of treatment have been discussed with the patient and family. After consideration of risks, benefits and other options for treatment, the patient has consented to  Procedure(s): RIGHT/LEFT HEART CATH AND CORONARY ANGIOGRAPHY (N/A) as a surgical intervention.  The patient's history has been reviewed, patient examined, no change in status, stable for surgery.  I have reviewed the patient's chart and labs.  Questions were answered to the patient's satisfaction.     [place]

## 2019-12-10 NOTE — Progress Notes (Signed)
PT Cancellation Note  Patient Details Name: ANAIRIS KNICK MRN: [id number] DOB: 1966/01/22   Cancelled Treatment:    Reason Eval/Treat Not Completed: Patient at procedure or test/unavailable (Pt in TEE.  Will return at later date. )   Denice Paradise 12/10/2019, 12:13 PM Geanie Pacifico W,PT [place] Pager:  [phone]  Office:  [phone]

## 2019-12-10 NOTE — Anesthesia Postprocedure Evaluation (Signed)
Anesthesia Post Note  Patient: Holly Figueroa  Procedure(s) Performed: TRANSESOPHAGEAL ECHOCARDIOGRAM (TEE) (N/A )     Patient location during evaluation: Endoscopy Anesthesia Type: General Level of consciousness: awake Pain management: pain level controlled Vital Signs Assessment: post-procedure vital signs reviewed and stable Respiratory status: spontaneous breathing Cardiovascular status: stable Postop Assessment: no apparent nausea or vomiting Anesthetic complications: no   No complications documented.  Last Vitals:  Vitals:   12/10/19 1404 12/10/19 1414  BP: (!) 120/31 (!) 100/36  Pulse: 69 62  Resp: (!) 22 19  Temp:    SpO2: 91% (!) 88%    Last Pain:  Vitals:   12/10/19 1414  TempSrc:   PainSc: 0-No pain                 Crystalann Korf

## 2019-12-10 NOTE — Interval H&P Note (Signed)
History and Physical Interval Note:  12/10/2019 3:27 PM  Holly Figueroa  has presented today for surgery, with the diagnosis of [id number].  The various methods of treatment have been discussed with the patient and family. After consideration of risks, benefits and other options for treatment, the patient has consented to  Procedure(s): RIGHT/LEFT HEART CATH AND CORONARY ANGIOGRAPHY (N/A) as a surgical intervention.  The patient's history has been reviewed, patient examined, no change in status, stable for surgery.  I have reviewed the patient's chart and labs.  Questions were answered to the patient's satisfaction.    2012 Appropriate Use Criteria for Diagnostic Catheterization Home / Select Test of Interest Desired Test and Scenario Evaluation for ischemic etiology for known heart failure without established etiology Link Here: http://www.murphy-brown.com/ Indication:  Angina/ischemic equivalent syndrome A (Appropriate); HF Indication: 6    Aviela Blundell J Kristin Lamagna

## 2019-12-10 NOTE — Progress Notes (Signed)
Echocardiogram Echocardiogram Transesophageal has been performed.  Holly Figueroa 12/10/2019, 2:09 PM

## 2019-12-10 NOTE — Progress Notes (Signed)
PROGRESS NOTE    Holly Figueroa  BHA:[id number] DOB: 1965-09-12 DOA: 12/08/2019 PCP: Glenda Chroman, MD   Brief Narrative: 54 year old female with history of diabetes, hyperlipidemia, tobacco abuse, COPD, depression and anxiety, mitral valve replacement on Coumadin, GERD, obstructive sleep apnea, admitted with shortness of breath cough congestion and was found to be severely hypoxic.  At baseline she uses 3 L of oxygen at night.  She was treated as an outpatient with doxycycline with some improvement she finished a course of 10 days of doxy.  She denies any fever. She reports using 8 pillows at night to sleep due to orthopnea. Chest x-ray in the ER showed diffuse pulmonary edema.  Assessment & Plan:   Principal Problem:   Acute on chronic respiratory failure with hypoxia (HCC) Active Problems:   Tobacco abuse   Macrocytic anemia   Gastroesophageal reflux disease   Chronic obstructive pulmonary disease (HCC)   CHF exacerbation (HCC)   Diabetes mellitus without complication (HCC)   Mitral stenosis   Elevated troponin   Thrush   Depression   Anxiety    1 acute on chronic hypoxic respiratory failure secondary to acute diastolic CHF exacerbation in the setting of COPD-she is admitted with shortness of breath cough orthopnea and hypoxia, BNP 319 She is status post TEE results pending at this time Echo 6/16 with ejection fraction 45 to 50% I's and O's negative by 2420 from 1150 Weights no weights recorded Chest x-ray with pulmonary edema Creatinine 1.34 up from 1.04 yesterday on IV diuresis.  Plan for cath noted.  Monitor renal functions closely.  Mildly elevated troponin 62-241-52 likely related to demand PT eval noted with desaturation to 83% with ambulation.  2 history of essential hypertension-blood pressure 100/36.  Managed by cardiology.  3 hyperlipidemia on statin  4 type 2 diabetes continue SSI.  On Metformin and glipizide at home.  A1c is 6.5 CBG (last 3)    Recent Labs    12/10/19 0616 12/10/19 0739 12/10/19 1432  GLUCAP 100* 131* 89     5 chronic macrocytic anemia TSH b 12 and folate are normal.  6 status post AVR and mitral valve replacement on Coumadin INR 1.5  7 obstructive sleep apnea on oxygen at night at 3 L  8 tobacco abuse continue nicotine patch  9 thrush continue nystatin likely due to recent antibiotic use.    Estimated body mass index is 28.44 kg/m as calculated from the following:   Height as of this encounter: _0  (1.676 m).   Weight as of this encounter: 79.9 kg.  DVT prophylaxis: Coumadin/Lovenox  code Status: Full code Family Communication: None at bedside Disposition Plan:  Status is: Inpatient  Dispo: The patient is from: Home  Anticipated d/c is to: Home  Anticipated d/c date is: 1 to 2 days  Patient currently is not medically stable to d/c.  Patient admitted with recurrent acute pulmonary edema/diastolic heart failure work-up pending for cath in a.m.   Consultants:  Cardiology  Procedures: None Antimicrobials Rocephin   Subjective:   Objective: Vitals:   12/10/19 1139 12/10/19 1355 12/10/19 1404 12/10/19 1414  BP: (!) 100/41 (!) 113/49 (!) 120/31 (!) 100/36  Pulse: 68 69 69 62  Resp: 14 20 (!) 22 19  Temp: 97.7 F (36.5 C) (!) 97.5 F (36.4 C)    TempSrc: Oral Temporal    SpO2: 100% 95% 91% (!) 88%  Weight:      Height:        Intake/Output  Summary (Last 24 hours) at 12/10/2019 1427 Last data filed at 12/10/2019 1346 Gross per 24 hour  Intake 730 ml  Output 2000 ml  Net -1270 ml   Filed Weights   12/08/19 1559 12/09/19 0514 12/10/19 0028  Weight: 82.5 kg 80.5 kg 79.9 kg    Examination:  General exam: Appears calm and comfortable  Respiratory system: Clear to auscultation. Respiratory effort normal. Cardiovascular system: S1 & S2 heard, RRR. No JVD,  Clicks heard No pedal edema. Gastrointestinal system: Abdomen is nondistended,  soft and nontender. No organomegaly or masses felt. Normal bowel sounds heard. Central nervous system: Alert and oriented. No focal neurological deficits. Extremities: Symmetric 5 x 5 power. Skin: No rashes, lesions or ulcers Psychiatry: Judgement and insight appear normal. Mood & affect appropriate.     Data Reviewed: I have personally reviewed following labs and imaging studies  CBC: Recent Labs  Lab 12/08/19 0958 12/08/19 1249 12/09/19 0554 12/10/19 0328  WBC 13.3*  --  8.8 6.7  HGB 9.0* 9.2* 7.9* 8.0*  HCT 29.0* 27.0* 25.3* 25.8*  MCV 100.7*  --  99.6 99.2  PLT 266  --  242 427   Basic Metabolic Panel: Recent Labs  Lab 12/08/19 0958 12/08/19 1249 12/08/19 1501 12/09/19 0554 12/10/19 0328  NA 135 139  --  138 138  K 3.5 3.6  --  3.0* 5.0  CL 100  --   --  99 101  CO2 23  --   --  29 27  GLUCOSE 285*  --   --  127* 101*  BUN 9  --   --  10 17  CREATININE 1.02*  --   --  1.04* 1.34*  CALCIUM 8.7*  --   --  8.2* 8.6*  MG  --   --  1.4*  --  2.4   GFR: Estimated Creatinine Clearance: 52.3 mL/min (A) (by C-G formula based on SCr of 1.34 mg/dL (H)). Liver Function Tests: No results for input(s): AST, ALT, ALKPHOS, BILITOT, PROT, ALBUMIN in the last 168 hours. No results for input(s): LIPASE, AMYLASE in the last 168 hours. No results for input(s): AMMONIA in the last 168 hours. Coagulation Profile: Recent Labs  Lab 12/08/19 1501 12/09/19 0554 12/10/19 0328  INR 1.4* 1.4* 1.5*   Cardiac Enzymes: No results for input(s): CKTOTAL, CKMB, CKMBINDEX, TROPONINI in the last 168 hours. BNP (last 3 results) No results for input(s): PROBNP in the last 8760 hours. HbA1C: Recent Labs    12/08/19 1500  HGBA1C 6.5*   CBG: Recent Labs  Lab 12/09/19 1115 12/09/19 1615 12/09/19 2128 12/10/19 0616 12/10/19 0739  GLUCAP 120* 142* 113* 100* 131*   Lipid Profile: No results for input(s): CHOL, HDL, LDLCALC, TRIG, CHOLHDL, LDLDIRECT in the last 72 hours. Thyroid  Function Tests: Recent Labs    12/08/19 1501  TSH 0.654   Anemia Panel: Recent Labs    12/08/19 1501 12/09/19 1630  VITAMINB12 330 301  FOLATE 7.9 8.1  FERRITIN  --  938*  TIBC  --  248*  IRON  --  39  RETICCTPCT  --  3.0   Sepsis Labs: Recent Labs  Lab 12/08/19 1500 12/08/19 1501 12/08/19 1646  PROCALCITON  --  0.21  --   LATICACIDVEN 2.8*  --  1.7    Recent Results (from the past 240 hour(s))  SARS Coronavirus 2 by RT PCR (hospital order, performed in [facility] hospital lab) Nasopharyngeal Nasopharyngeal Swab     Status: None  Collection Time: 12/08/19 12:56 PM   Specimen: Nasopharyngeal Swab  Result Value Ref Range Status   SARS Coronavirus 2 NEGATIVE NEGATIVE Final    Comment: (NOTE) SARS-CoV-2 target nucleic acids are NOT DETECTED.  The SARS-CoV-2 RNA is generally detectable in upper and lower respiratory specimens during the acute phase of infection. The lowest concentration of SARS-CoV-2 viral copies this assay can detect is 250 copies / mL. A negative result does not preclude SARS-CoV-2 infection and should not be used as the sole basis for treatment or other patient management decisions.  A negative result may occur with improper specimen collection / handling, submission of specimen other than nasopharyngeal swab, presence of viral mutation(s) within the areas targeted by this assay, and inadequate number of viral copies (<250 copies / mL). A negative result must be combined with clinical observations, patient history, and epidemiological information.  Fact Sheet for Patients:   StrictlyIdeas.no  Fact Sheet for Healthcare Providers: BankingDealers.co.za  This test is not yet approved or  cleared by the [place] FDA and has been authorized for detection and/or diagnosis of SARS-CoV-2 by FDA under an Emergency Use Authorization (EUA).  This EUA will remain in effect (meaning this test can be used)  for the duration of the COVID-19 declaration under Section 564(b)(1) of the Act, 21 U.S.C. section 360bbb-3(b)(1), unless the authorization is terminated or revoked sooner.  Performed at [facility], [place] [address], [place], [place] [address]   Culture, blood (routine x 2)     Status: None (Preliminary result)   Collection Time: 12/08/19  3:05 PM   Specimen: BLOOD RIGHT HAND  Result Value Ref Range Status   Specimen Description BLOOD RIGHT HAND  Final   Special Requests   Final    BOTTLES DRAWN AEROBIC AND ANAEROBIC Blood Culture results may not be optimal due to an inadequate volume of blood received in culture bottles   Culture   Final    NO GROWTH 2 DAYS Performed at [facility], [place] [address] [place], [place] [address]    Report Status PENDING  Incomplete  Culture, blood (routine x 2)     Status: None (Preliminary result)   Collection Time: 12/08/19  4:51 PM   Specimen: BLOOD LEFT ARM  Result Value Ref Range Status   Specimen Description BLOOD LEFT ARM  Final   Special Requests   Final    BOTTLES DRAWN AEROBIC ONLY Blood Culture adequate volume   Culture   Final    NO GROWTH 2 DAYS Performed at [facility], [place] [address], [place], [place] [address]    Report Status PENDING  Incomplete  Culture, Urine     Status: Abnormal   Collection Time: 12/08/19  5:16 PM   Specimen: Urine, Random  Result Value Ref Range Status   Specimen Description URINE, RANDOM  Final   Special Requests   Final    NONE Performed at [facility], [place] [address] [place], [place] [address]    Culture (A)  Final    >=100,000 COLONIES/mL ESCHERICHIA COLI Confirmed Extended Spectrum Beta-Lactamase Producer (ESBL).  In bloodstream infections from ESBL organisms, carbapenems are preferred over piperacillin/tazobactam. They are shown to have a lower risk of mortality.    Report Status 12/10/2019 FINAL  Final   Organism ID, Bacteria ESCHERICHIA COLI (A)  Final       Susceptibility   Escherichia coli - MIC*    AMPICILLIN >=32 RESISTANT Resistant  CEFAZOLIN >=64 RESISTANT Resistant     CEFTRIAXONE >=64 RESISTANT Resistant     CIPROFLOXACIN 0.5 SENSITIVE Sensitive     GENTAMICIN >=16 RESISTANT Resistant     IMIPENEM <=0.25 SENSITIVE Sensitive     NITROFURANTOIN <=16 SENSITIVE Sensitive     TRIMETH/SULFA >=320 RESISTANT Resistant     AMPICILLIN/SULBACTAM >=32 RESISTANT Resistant     PIP/TAZO <=4 SENSITIVE Sensitive     * >=100,000 COLONIES/mL ESCHERICHIA COLI         Radiology Studies: ECHOCARDIOGRAM COMPLETE  Result Date: 12/09/2019    ECHOCARDIOGRAM REPORT   Patient Name:   Holly Figueroa Date of Exam: 12/09/2019 Medical Rec #:  [id number]           Height:       66.0 in Accession #:    [id number]          Weight:       177.5 lb Date of Birth:  03/09/1966            BSA:          1.901 m Patient Age:    65 years            BP:           101/54 mmHg Patient Gender: F                   HR:           63 bpm. Exam Location:  Inpatient Procedure: 2D Echo, Color Doppler and Cardiac Doppler Indications:    I50.9* Heart failure (unspecified)  History:        Patient has prior history of Echocardiogram examinations, most                 recent 03/28/2019. CHF, COPD; Risk Factors:Diabetes. 11/01/17                 Aortic Valve replaced with 46m [employer]ent Bileaflet Mechanical                 Prosthetic and Mitral Valve replaced with 278m[employer]ent Bileaflet                 Mechanical Prosthetic.                  Mitral Valve: 25 mm mechanical valve valve is present in the                 mitral position.  Sonographer:    EmRaquel Sarnaenior RDCS Referring Phys: [id number]. Left ventricular ejection fraction, by estimation, is 55 to 60%. The left ventricle has normal function. The left ventricle has no regional wall motion abnormalities. There is mild concentric left ventricular hypertrophy. Left ventricular diastolic parameters are indeterminate.  Elevated left ventricular end-diastolic pressure.  2. Right ventricular systolic function is normal. The right ventricular size is normal. There is normal pulmonary artery systolic pressure. The estimated right ventricular systolic pressure is 3318.2mHg.  3. Left atrial size was mildly dilated.  4. The mitral valve has been repaired/replaced. No evidence of mitral stenosis. There is a 25 mm mechanical valve present in the mitral position. Echo findings are consistent with normal structure and function of the mitral valve prosthesis. Mildly increased gradient across MV with MV peak gradient, 15.6 mmHg. The mean mitral valve gradient is 5 mmHg. Mitral regurgitation cannot be assessed due to LA shadowing from mechanical MV mitral valve regurgitation.  5. The  aortic valve has been repaired/replaced. There is a mechanical valve present in the aortic position. Echo findings are consistent with normal structure and function of the aortic valve prosthesis. Trivial perivalvular AI is present. There is a mild gradient across the AV with mean AVG 67mHg.  6. The inferior vena cava is normal in size with greater than 50% respiratory variability, suggesting right atrial pressure of 3 mmHg.  7. COmpared to echo of 03/2019, the mean MVG of MV mechanical prosthesis has improved from 7 to 589mg. The mean AVG of the AV mechanical prosthesis has also improved from 29 to 2545m. FINDINGS  Left Ventricle: Left ventricular ejection fraction, by estimation, is 55 to 60%. The left ventricle has normal function. The left ventricle has no regional wall motion abnormalities. The left ventricular internal cavity size was normal in size. There is  mild concentric left ventricular hypertrophy. Left ventricular diastolic parameters are indeterminate. Elevated left ventricular end-diastolic pressure. Right Ventricle: The right ventricular size is normal. No increase in right ventricular wall thickness. Right ventricular systolic function is  normal. There is normal pulmonary artery systolic pressure. The tricuspid regurgitant velocity is 2.74 m/s, and  with an assumed right atrial pressure of 3 mmHg, the estimated right ventricular systolic pressure is 33.02.7Hg. Left Atrium: Left atrial size was mildly dilated. Right Atrium: Right atrial size was normal in size. Pericardium: There is no evidence of pericardial effusion. Mitral Valve: The mitral valve has been repaired/replaced. Mitral regurgitation cannot be assessed due to LA shadowing from mechanical MV mitral valve regurgitation. There is a 25 mm mechanical valve present in the mitral position. Echo findings are consistent with normal structure and function of the mitral valve prosthesis. No evidence of mitral valve stenosis. MV peak gradient, 15.6 mmHg. The mean mitral valve gradient is 5.0 mmHg. Tricuspid Valve: The tricuspid valve is normal in structure. Tricuspid valve regurgitation is mild . No evidence of tricuspid stenosis. Aortic Valve: The aortic valve has been repaired/replaced. Aortic valve regurgitation Trivial perivalvular AI. No aortic stenosis is present. Aortic valve mean gradient measures 25.0 mmHg. Aortic valve peak gradient measures 52.1 mmHg. Aortic valve area,  by VTI measures 0.95 cm. There is a mechanical valve present in the aortic position. Echo findings are consistent with normal structure and function of the aortic valve prosthesis. Pulmonic Valve: The pulmonic valve was normal in structure. Pulmonic valve regurgitation is not visualized. No evidence of pulmonic stenosis. Aorta: The aortic root is normal in size and structure. Venous: The inferior vena cava is normal in size with greater than 50% respiratory variability, suggesting right atrial pressure of 3 mmHg. IAS/Shunts: No atrial level shunt detected by color flow Doppler.  LEFT VENTRICLE PLAX 2D LVIDd:         3.50 cm  Diastology LVIDs:         2.50 cm  LV e' lateral:   5.44 cm/s LV PW:         1.40 cm  LV E/e'  lateral: 33.6 LV IVS:        1.20 cm  LV e' medial:    4.79 cm/s LVOT diam:     1.70 cm  LV E/e' medial:  38.2 LV SV:         75 LV SV Index:   40 LVOT Area:     2.27 cm  RIGHT VENTRICLE RV S prime:     5.55 cm/s TAPSE (M-mode): 1.6 cm LEFT ATRIUM  Index       RIGHT ATRIUM           Index LA Vol (A2C):   65.5 ml 34.46 ml/m RA Area:     16.70 cm LA Vol (A4C):   77.0 ml 40.51 ml/m RA Volume:   45.40 ml  23.89 ml/m LA Biplane Vol: 72.4 ml 38.09 ml/m  AORTIC VALVE AV Area (Vmax):    0.76 cm AV Area (Vmean):   0.88 cm AV Area (VTI):     0.95 cm AV Vmax:           361.00 cm/s AV Vmean:          229.000 cm/s AV VTI:            0.797 m AV Peak Grad:      52.1 mmHg AV Mean Grad:      25.0 mmHg LVOT Vmax:         121.00 cm/s LVOT Vmean:        89.200 cm/s LVOT VTI:          0.332 m LVOT/AV VTI ratio: 0.42  AORTA Ao Asc diam: 2.60 cm MITRAL VALVE                TRICUSPID VALVE MV Area (PHT): 3.48 cm     TR Peak grad:   30.0 mmHg MV Peak grad:  15.6 mmHg    TR Vmax:        274.00 cm/s MV Mean grad:  5.0 mmHg MV Vmax:       1.98 m/s     SHUNTS MV Vmean:      93.0 cm/s    Systemic VTI:  0.33 m MV Decel Time: 218 msec     Systemic Diam: 1.70 cm MV E velocity: 183.00 cm/s MV A velocity: 52.80 cm/s MV E/A ratio:  3.47 Fransico Him MD Electronically signed by Fransico Him MD Signature Date/Time: 12/09/2019/2:53:13 PM    Final         Scheduled Meds: . ALPRAZolam  0.5 mg Oral TID  . dextromethorphan-guaiFENesin  1 tablet Oral BID  . enoxaparin (LOVENOX) injection  80 mg Subcutaneous Q12H  . FLUoxetine  60 mg Oral Daily  . insulin aspart  0-15 Units Subcutaneous TID WC  . insulin aspart  0-5 Units Subcutaneous QHS  . metoprolol tartrate  50 mg Oral BID  . nystatin  5 mL Oral QID  . pantoprazole  40 mg Oral QPC breakfast  . rosuvastatin  10 mg Oral QPC breakfast  . sodium chloride flush  3 mL Intravenous Q12H  . sodium chloride flush  3 mL Intravenous Q12H  . traZODone  100 mg Oral QHS  .  Warfarin - Pharmacist Dosing Inpatient   Does not apply q1600   Continuous Infusions: . sodium chloride       LOS: 2 days     Georgette Shell, MD  12/10/2019, 2:27 PM

## 2019-12-10 NOTE — Progress Notes (Signed)
Subjective:  Holly Figueroa is feeling much improved, orthopnea has also improved.  No chest pain or palpitations.  Intake/Output from previous day:  I/O last 3 completed shifts: In: 830 [P.O.:480; I.V.:250; IV Piggyback:100] Out: 1950 [Urine:1950] Total I/O In: -  Out: 700 [Urine:700]  Blood pressure (!) 95/37, pulse (!) 59, temperature 97.9 F (36.6 C), temperature source Oral, resp. rate 19, height 5' 6" (1.676 m), weight 79.9 kg, SpO2 100 %. Physical Exam Cardiovascular:     Rate and Rhythm: Normal rate and regular rhythm.     Pulses: Intact distal pulses.     Heart sounds: Murmur heard.  Harsh midsystolic murmur is present with a grade of 3/6 at the upper right sternal border radiating to the neck.  No gallop.      Comments: Crisp mechanical S1 and S2 heard. No leg edema, no JVD. Pulmonary:     Effort: Pulmonary effort is normal.     Breath sounds: Normal breath sounds.  Abdominal:     General: Bowel sounds are normal.     Palpations: Abdomen is soft.    Lab Results: BMP BNP (last 3 results) Recent Labs    03/28/19 0248 12/08/19 1230  BNP 162.0* 319.5*    ProBNP (last 3 results) No results for input(s): PROBNP in the last 8760 hours. BMP Latest Ref Rng & Units 12/10/2019 12/09/2019 12/08/2019  Glucose 70 - 99 mg/dL 101(H) 127(H) -  BUN 6 - 20 mg/dL 17 10 -  Creatinine 0.44 - 1.00 mg/dL 1.34(H) 1.04(H) -  Sodium 135 - 145 mmol/L 138 138 139  Potassium 3.5 - 5.1 mmol/L 5.0 3.0(L) 3.6  Chloride 98 - 111 mmol/L 101 99 -  CO2 22 - 32 mmol/L 27 29 -  Calcium 8.9 - 10.3 mg/dL 8.6(L) 8.2(L) -   Hepatic Function Latest Ref Rng & Units 03/28/2019 08/24/2017 08/21/2017  Total Protein 6.5 - 8.1 g/dL 7.3 7.1 7.5  Albumin 3.5 - 5.0 g/dL 3.3(L) 3.1(L) 3.2(L)  AST 15 - 41 U/L 22 48(H) 21  ALT 0 - 44 U/L 23 42 21  Alk Phosphatase 38 - 126 U/L 117 76 78  Total Bilirubin 0.3 - 1.2 mg/dL 0.7 0.7 0.4  Bilirubin, Direct 0.1 - 0.5 mg/dL - - -   CBC Latest Ref Rng & Units 12/10/2019 12/09/2019  12/08/2019  WBC 4.0 - 10.5 K/uL 6.7 8.8 -  Hemoglobin 12.0 - 15.0 g/dL 8.0(L) 7.9(L) 9.2(L)  Hematocrit 36 - 46 % 25.8(L) 25.3(L) 27.0(L)  Platelets 150 - 400 K/uL 291 242 -   Lipid Panel     Component Value Date/Time   CHOL 98 08/20/2017 0524   TRIG 121 09/09/2017 1540   HDL 34 (L) 08/20/2017 0524   CHOLHDL 2.9 08/20/2017 0524   VLDL 22 08/20/2017 0524   LDLCALC 42 08/20/2017 0524   Cardiac Panel (last 3 results) No results for input(s): CKTOTAL, CKMB, TROPONINI, RELINDX in the last 72 hours.  HEMOGLOBIN A1C Lab Results  Component Value Date   HGBA1C 6.5 (H) 12/08/2019   MPG 139.85 12/08/2019   TSH Recent Labs    03/28/19 0240 12/08/19 1501  TSH 0.415 0.654   Imaging: DG Chest 2 View  Result Date: 12/08/2019 CLINICAL DATA:  Chest pain and shortness of breath for 2 days. Hypoxia. EXAM: CHEST - 2 VIEW COMPARISON:  03/27/2019 FINDINGS: Stable mild cardiomegaly. 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 Diffuse interstitial and airspace disease shows mild increase since previous study, most likely due to diffuse pulmonary edema. No   evidence of pleural effusion. IMPRESSION: Mild increase in diffuse interstitial and airspace disease, likely due to diffuse pulmonary edema. Electronically Signed   By: John A Stahl M.D.   On: 12/08/2019 10:24  Cardiac Studies: Valve replacement at [facility] 04/03/2019: Mechanical prosthetic AV 19mm ST JUDE REGENT STANDARD MECH PROSTHETIC MV: 25mm ST JUDE REGENT STANDARD   TTE 04/03/19 at [facility] NORMAL LEFT VENTRICULAR FUNCTION WITH MILD LVH NORMAL RIGHT VENTRICULAR SYSTOLIC FUNCTION VALVULAR REGURGITATION: TRIVIAL PR, MILD TR PROSTHETIC VALVE(S): MECH PROSTHETIC AoV, MECH PROSTHETIC, peak velocity 3.5 m/s, peak pressure gradient 48 and mean pressure gradient 25 mmHg.  Dimensionless index 0.49. ESTIMATED RVSP AT LEAST 30mmHg THERE APPEARS TO BE AT LEAST MILD MR, THOUGH MECHANICAL MITRAL VALVE MV, peak 18mmHg and mean 7 mmHg. PREVENTS  ACCURATE ASSESSMENT. PA ACCELERATION TIME OF 60 msec IS CONSISTENT WITH ELEVATED PA PRESSURES. ESTIMATED PA MEAN IS 53mmHg.  RHC 04/06/19 State: Baseline RA: 14 mmHg (mean) RV: 54/ 11 mmHg PA: 54/ 25 36 mmHg (mean) PCW: 22 mmHg (mean) AV O2: 3.8 vol% Cardiac output: 6.0 L/min Cardiac index: 3.1 L/min-m2 PVR: 2.3 Wood units _____________________  Echocardiogram 12/09/2019: 1. Left ventricular ejection fraction, by estimation, is 55 to 60%. The  left ventricle has normal function. The left ventricle has no regional  wall motion abnormalities. There is mild concentric left ventricular  hypertrophy. Left ventricular diastolic  parameters are indeterminate. Elevated left ventricular end-diastolic pressure.  2. Right ventricular systolic function is normal. The right ventricular  size is normal. There is normal pulmonary artery systolic pressure. The  estimated right ventricular systolic pressure is 33.0 mmHg.  3. Left atrial size was mildly dilated.  4. The mitral valve has been repaired/replaced. No evidence of mitral  stenosis. There is a 25 mm mechanical valve present in the mitral  position. Echo findings are consistent with normal structure and function  of the mitral valve prosthesis. Mildly  increased gradient across MV with MV peak gradient, 15.6 mmHg. The mean  mitral valve gradient is 5 mmHg. Mitral regurgitation cannot be assessed  due to LA shadowing from mechanical MV mitral valve regurgitation.  5. The aortic valve has been repaired/replaced. There is a mechanical  valve present in the aortic position. Echo findings are consistent with  normal structure and function of the aortic valve prosthesis. Trivial  perivalvular AI is present. There is a  mild gradient across the AV with mean AVG 25mmHg.  6. The inferior vena cava is normal in size with greater than 50%  respiratory variability, suggesting right atrial pressure of 3 mmHg.  7. Compared to echo of 03/2019, the mean  MVG of MV mechanical prosthesis  has improved from 7 to 5mmHg. The mean AVG of the AV mechanical prosthesis  has also improved from 29 to 25mmHg.  EKG: EKG 12/08/2019: Normal sinus rhythm at rate of 83 bpm, left atrial enlargement, normal axis.  No evidence of ischemia.  Scheduled Meds: . ALPRAZolam  0.5 mg Oral TID  . dextromethorphan-guaiFENesin  1 tablet Oral BID  . enoxaparin (LOVENOX) injection  80 mg Subcutaneous Q12H  . FLUoxetine  60 mg Oral Daily  . furosemide  40 mg Intravenous Q8H  . insulin aspart  0-15 Units Subcutaneous TID WC  . insulin aspart  0-5 Units Subcutaneous QHS  . metoprolol tartrate  50 mg Oral BID  . nystatin  5 mL Oral QID  . pantoprazole  40 mg Oral QPC breakfast  . potassium chloride  40 mEq Oral TID  . rosuvastatin    10 mg Oral QPC breakfast  . sacubitril-valsartan  1 tablet Oral BID  . sodium chloride flush  3 mL Intravenous Q12H  . sodium chloride flush  3 mL Intravenous Q12H  . traZODone  100 mg Oral QHS  . Warfarin - Pharmacist Dosing Inpatient   Does not apply q1600   Continuous Infusions: . sodium chloride     PRN Meds:.sodium chloride, acetaminophen, ipratropium-albuterol, sodium chloride flush  Assessment/Plan:  Holly Figueroa  is a 53 y.o. female with a hx of mitral stenosis and aortic stenosis s/p mMVR and mAVR (Dr. Haney, 11/01/2017), HTN, COPD, DM2, IDA, GERD, prior tobacco use disorder quit in May 2019 after valve surgery, OSA unable to tolerate CPAP and presently on nocturnal O2 therapy who has had multiple outpatient evaluations and also recent hospitalization with recurrent pulmonary edema.  I had not seen her for almost 2 years.  Since I had last seen her, Holly Figueroa has undergone mitral valve and aortic valve replacement.  Postoperative course was complicated by sternal dehiscence and wound infection, recurrent pulmonary edema, paroxysmal atrial fibrillation and recurrent congestive heart failure.  Holly Figueroa has had multiple hospitalizations and  office visits due to continued dyspnea, presently sleeps on 6 pillows.  1.  Recurrent acute pulmonary edema 2.  History of mechanical aortic and mitral valve replacement on 04/03/2019  AV 19mm ST JUDE REGENT STANDARD MECH PROSTHETIC MV: 25mm ST JUDE REGENT STANDARD 3.  Mechanical aortic valve dysfunction with moderate aortic valve stenosis, peak gradient close to 4 m/s with a mean gradient of 25 mmHg. 4.  Hypertension 5.  Long-term anticoagulation with warfarin 6. Essential hypertension 7. OSA not on CPAP and presently on home O2. 8. Acute renal insufficiency due to over diuresis.  Rec patient with recurrent pulmonary edema, I cannot clearly see the aortic valve, although in some views appears to open well, cannot explain 4 m jet across the prosthetic aortic valve.  Holly Figueroa will need left and right heart catheterization, patient has already been scheduled for TEE.  Schedule for cardiac catheterization, and possible angioplasty. We discussed regarding risks, benefits, alternatives to this including stress testing, CTA and continued medical therapy. Patient wants to proceed. Understands <1-2% risk of death, stroke, MI, urgent CABG, bleeding, infection, renal failure but not limited to these.   Patient is aware of risk of TEE including aspiration pneumonia and esophageal perforation, trauma and bleeding.  We will hold her Entresto this morning, will also hold this morning dose of furosemide.  We will make further recommendations after the catheterization.   Tahjae Clausing, M.D. 12/10/2019, 9:44 AM [facility] Pager: [phone] Office: [phone] If no answer: [phone]  

## 2019-12-10 NOTE — Anesthesia Procedure Notes (Signed)
Procedure Name: MAC Date/Time: 12/10/2019 1:20 PM Performed by: Inda Coke, CRNA Pre-anesthesia Checklist: Patient identified, Emergency Drugs available, Suction available, Timeout performed and Patient being monitored Patient Re-evaluated:Patient Re-evaluated prior to induction Oxygen Delivery Method: Nasal cannula Induction Type: IV induction Dental Injury: Teeth and Oropharynx as per pre-operative assessment

## 2019-12-10 NOTE — Anesthesia Preprocedure Evaluation (Addendum)
Anesthesia Evaluation  Patient identified by MRN, date of birth, ID band  Reviewed: Allergy & Precautions, NPO status , Patient's Chart, lab work & pertinent test results  Airway Mallampati: II  TM Distance: >3 FB     Dental   Pulmonary pneumonia, COPD, Current Smoker and Patient abstained from smoking.,    breath sounds clear to auscultation       Cardiovascular +CHF   Rhythm:Irregular Rate:Normal  History noted CG   Neuro/Psych  Headaches, PSYCHIATRIC DISORDERS Anxiety    GI/Hepatic Neg liver ROS, GERD  ,  Endo/Other  diabetes  Renal/GU negative Renal ROS     Musculoskeletal   Abdominal   Peds  Hematology  (+) anemia ,   Anesthesia Other Findings   Reproductive/Obstetrics                            Anesthesia Physical Anesthesia Plan  ASA: III  Anesthesia Plan: General   Post-op Pain Management:    Induction: Intravenous  PONV Risk Score and Plan: 2 and Propofol infusion  Airway Management Planned: Nasal Cannula and Simple Face Mask  Additional Equipment:   Intra-op Plan:   Post-operative Plan:   Informed Consent:     Dental advisory given  Plan Discussed with: CRNA and Anesthesiologist  Anesthesia Plan Comments:        Anesthesia Quick Evaluation

## 2019-12-10 NOTE — Progress Notes (Signed)
ANTICOAGULATION CONSULT NOTE -  Follow up  Pharmacy Consult for warfarin Indication: mechanical MVR, AVR  Allergies  Allergen Reactions  . Iodinated Diagnostic Agents Anaphylaxis  . Aspirin Other (See Comments)    "Possible blood in stool" the 31m. Can take 842m  . Ibuprofen Other (See Comments)    "Possible blood in stool"  . Sulfa Antibiotics Hives  . Zofran [Ondansetron Hcl]     Bad headaches  . Dilaudid [Hydromorphone Hcl] Itching and Palpitations  . Hydrocodone Palpitations    Patient Measurements: Height: _0  (167.6 cm) Weight: 79.9 kg (176 lb 3.2 oz) IBW/kg (Calculated) : 59.3 Heparin (IV) Dosing Weight: 76 kg  Vital Signs: Temp: 97.5 F (36.4 C) (06/17 1355) Temp Source: Temporal (06/17 1355) BP: 100/36 (06/17 1414) Pulse Rate: 62 (06/17 1414)  Labs: Recent Labs    12/08/19 0958 12/08/19 0958 12/08/19 1247 12/08/19 1249 12/08/19 1249 12/08/19 1501 12/08/19 1646 12/09/19 0554 12/10/19 0328  HGB 9.0*   < >  --  9.2*   < >  --   --  7.9* 8.0*  HCT 29.0*   < >  --  27.0*  --   --   --  25.3* 25.8*  PLT 266  --   --   --   --   --   --  242 291  APTT  --   --   --   --   --  48*  --   --   --   LABPROT  --   --   --   --   --  16.7*  --  17.0* 17.9*  INR  --   --   --   --   --  1.4*  --  1.4* 1.5*  CREATININE 1.02*  --   --   --   --   --   --  1.04* 1.34*  TROPONINIHS 16   < > 60*  --   --   --  241* 52*  --    < > = values in this interval not displayed.    Estimated Creatinine Clearance: 52.3 mL/min (A) (by C-G formula based on SCr of 1.34 mg/dL (H)).   Medical History: Past Medical History:  Diagnosis Date  . [place] ([facility]  . Anemia 08-2008   Blood transfusion  . Anxiety   . Benzodiazepine dependence ([facility]CP[place]  . Cervical cancer ([facility]CL[facility] [place]  . CHF (congestive heart failure) (HCAust[place]  . Depression   . Diabetes mellitus without complication (HCLuc[place]  . Headache(784.0)    migraines  . Hypokalemia   . Legionella pneumonia (HC[facility]   . Leukocytosis, unspecified   . Mitral stenosis   . Panic attacks   . Tobacco abuse     Medications:  Scheduled:  . ALPRAZolam  0.5 mg Oral TID  . dextromethorphan-guaiFENesin  1 tablet Oral BID  . enoxaparin (LOVENOX) injection  80 mg Subcutaneous Q12H  . FLUoxetine  60 mg Oral Daily  . insulin aspart  0-15 Units Subcutaneous TID WC  . insulin aspart  0-5 Units Subcutaneous QHS  . metoprolol tartrate  50 mg Oral BID  . nystatin  5 mL Oral QID  . pantoprazole  40 mg Oral QPC breakfast  . rosuvastatin  10 mg Oral QPC breakfast  . sodium chloride flush  3 mL Intravenous Q12H  . sodium chloride flush  3 mL Intravenous Q12H  . traZODone  100 mg Oral QHS  .  Warfarin - Pharmacist Dosing Inpatient   Does not apply q1600    Assessment: Holly Figueroa with AVR, mechanical MVR on warfarin PTA. PTA dose 5 mg daily per CPhT med history. Last dose 6/13. INR subtherapeutic at 1.4 on admit.   Patient reported last PTA warfarin dose taken 6/13,  missed dose 6/14 PTA.  Warfarin 63m dose was given on 12/08/19 and 5159m(usual home dose) given on 6/16.   INR  Remains subtjherapeutic at 1.5 today, slowly increasing toward goal 2.5-3.5.  Continues on  Lovenox 59m28mg q12h bridge until INR therapeutic.  CrCl ~ 52 ml/min. Wt 79.9 kg  Baseline hgb 9.2> 7.9> 8.0, pltc wnl/stable.  No bleeding reported.    Goal of Therapy:  INR 2.5-3.5 Monitor platelets by anticoagulation protocol: Yes   Plan:  Warfarin 7.5 mg PO x1 tonight  Continue  Enoxaparin to 80 mg (1 mg/kg) [place] Q12h until therapeutic INR   Daily INR, CBC Monitor for bleeding    Thank you,  RutNicole CellaPh Clinical Pharmacist [phone] check amion for clinical pharmacist contact number 12/10/2019,2:59 PM

## 2019-12-11 ENCOUNTER — Encounter (HOSPITAL_COMMUNITY): Payer: Self-pay | Admitting: Cardiology

## 2019-12-11 LAB — BASIC METABOLIC PANEL
Anion gap: 9 (ref 5–15)
BUN: 18 mg/dL (ref 6–20)
CO2: 26 mmol/L (ref 22–32)
Calcium: 9.3 mg/dL (ref 8.9–10.3)
Chloride: 100 mmol/L (ref 98–111)
Creatinine, Ser: 1.14 mg/dL — ABNORMAL HIGH (ref 0.44–1.00)
GFR calc Af Amer: >60 mL/min (ref >60)
GFR calc non Af Amer: 55 mL/min — ABNORMAL LOW (ref >60)
Glucose, Bld: 151 mg/dL — ABNORMAL HIGH (ref 70–99)
Potassium: 4.2 mmol/L (ref 3.5–5.1)
Sodium: 135 mmol/L (ref 135–145)

## 2019-12-11 LAB — CBC
HCT: 28.2 % — ABNORMAL LOW (ref 36.0–46.0)
Hemoglobin: 8.9 g/dL — ABNORMAL LOW (ref 12.0–15.0)
MCH: 30.9 pg (ref 26.0–34.0)
MCHC: 31.6 g/dL (ref 30.0–36.0)
MCV: 97.9 fL (ref 80.0–100.0)
Platelets: 379 10*3/uL (ref 150–400)
RBC: 2.88 MIL/uL — ABNORMAL LOW (ref 3.87–5.11)
RDW: 14.2 % (ref 11.5–15.5)
WBC: 11.1 10*3/uL — ABNORMAL HIGH (ref 4.0–10.5)
nRBC: 0 % (ref 0.0–0.2)

## 2019-12-11 LAB — GLUCOSE, CAPILLARY
Glucose-Capillary: 120 mg/dL — ABNORMAL HIGH (ref 70–99)
Glucose-Capillary: 105 mg/dL — ABNORMAL HIGH (ref 70–99)
Glucose-Capillary: 132 mg/dL — ABNORMAL HIGH (ref 70–99)

## 2019-12-11 LAB — PROTIME-INR
INR: 1.6 — ABNORMAL HIGH (ref 0.8–1.2)
Prothrombin Time: 18.9 seconds — ABNORMAL HIGH (ref 11.4–15.2)

## 2019-12-11 LAB — BRAIN NATRIURETIC PEPTIDE: B Natriuretic Peptide: 276.7 pg/mL — ABNORMAL HIGH (ref 0.0–100.0)

## 2019-12-11 MED ORDER — SACUBITRIL-VALSARTAN 24-26 MG PO TABS
1.0000 | ORAL_TABLET | Freq: Two times a day (BID) | ORAL | Status: DC
  Administered 2019-12-11 – 2019-12-12 (×3): 1 via ORAL
  Filled 2019-12-11 (×3): qty 1

## 2019-12-11 MED ORDER — SPIRONOLACTONE 12.5 MG HALF TABLET
12.5000 mg | ORAL_TABLET | Freq: Every day | ORAL | Status: DC
  Administered 2019-12-11 – 2019-12-12 (×2): 12.5 mg via ORAL
  Filled 2019-12-11 (×2): qty 1

## 2019-12-11 MED ORDER — WARFARIN SODIUM 7.5 MG PO TABS
7.5000 mg | ORAL_TABLET | Freq: Once | ORAL | Status: AC
  Administered 2019-12-11: 7.5 mg via ORAL
  Filled 2019-12-11: qty 1

## 2019-12-11 NOTE — Progress Notes (Signed)
Physical Therapy Treatment Patient Details Name: Holly Figueroa MRN: [id number] DOB: 12/02/1965 Today's Date: 12/11/2019    History of Present Illness 54 y.o. female with medical history significant of 1, hyperlipidemia, diabetes mellitus, depression/anxiety, tobacco abuse, MVR on Coumadin therapy, GERD, obesity presents to emergency department due to worsening shortness of breath, cough, congestion and hypoxia.    PT Comments    Pt admitted with above diagnosis. Pt was able to ambulate without device with min guard assist.  Needs O2 at 3L to keep sats >90%.  Will continue to follow acutely.  Pt currently with functional limitations due to balance and endurance deficits. Pt will benefit from skilled PT to increase their independence and safety with mobility to allow discharge to the venue listed below.     Follow Up Recommendations  Home health PT;Supervision - Intermittent     Equipment Recommendations  Other (comment) (possibly rollator)    Recommendations for Other Services       Precautions / Restrictions Precautions Precautions: Fall Precaution Comments: monitor O2 Restrictions Weight Bearing Restrictions: No    Mobility  Bed Mobility Overal bed mobility: Modified Independent                Transfers Overall transfer level: Needs assistance Equipment used: None Transfers: Sit to/from Stand Sit to Stand: Supervision;Modified independent (Device/Increase time)         General transfer comment: supervision for lines/safety  Ambulation/Gait Ambulation/Gait assistance: Min guard Gait Distance (Feet): 145 Feet Assistive device: 1 person hand held assist;None Gait Pattern/deviations: Step-through pattern;Decreased stride length   Gait velocity interpretation: <1.31 ft/sec, indicative of household ambulator General Gait Details: Pt walked in hallway.  No LOB. Guarded gait overall as pt slow. DOE 3/4 with O2 desat to 85% on RA therefore left pt on 3LO2.      Stairs             Wheelchair Mobility    Modified Rankin (Stroke Patients Only)       Balance Overall balance assessment: Mild deficits observed, not formally tested                                          Cognition Arousal/Alertness: Awake/alert Behavior During Therapy: WFL for tasks assessed/performed Overall Cognitive Status: Within Functional Limits for tasks assessed                                        Exercises      General Comments        Pertinent Vitals/Pain Pain Assessment: No/denies pain    Home Living                      Prior Function            PT Goals (current goals can now be found in the care plan section) Acute Rehab PT Goals Patient Stated Goal: home soon Progress towards PT goals: Progressing toward goals    Frequency    Min 3X/week      PT Plan Current plan remains appropriate    Co-evaluation              AM-PAC PT "6 Clicks" Mobility   Outcome Measure  Help needed turning from your back to your side while in a  flat bed without using bedrails?: None Help needed moving from lying on your back to sitting on the side of a flat bed without using bedrails?: None Help needed moving to and from a bed to a chair (including a wheelchair)?: None Help needed standing up from a chair using your arms (e.g., wheelchair or bedside chair)?: A Little Help needed to walk in hospital room?: A Little Help needed climbing 3-5 steps with a railing? : A Little 6 Click Score: 21    End of Session Equipment Utilized During Treatment: Gait belt;Oxygen Activity Tolerance: Patient limited by fatigue Patient left: with call bell/phone within reach;in chair;with chair alarm set Nurse Communication: Mobility status PT Visit Diagnosis: Unsteadiness on feet (R26.81);Muscle weakness (generalized) (M62.81);Other abnormalities of gait and mobility (R26.89)     Time: 3361-2244 PT Time  Calculation (min) (ACUTE ONLY): 16 min  Charges:  $Gait Training: 8-22 mins                     Orlie Cundari W,PT [place] Pager:  [phone]  Office:  [place] 12/11/2019, 1:13 PM

## 2019-12-11 NOTE — TOC Initial Note (Addendum)
Transition of Care [facility]) - Initial/Assessment Note    Patient Details  Name: Holly Figueroa MRN: [id number] Date of Birth: 08-15-1965  [facility]) CM/SW Contact:    Verdell Carmine, RN Phone Number: [phone] 12/11/2019, 3:15 PM  Clinical Narrative:                 Patient admitted with shortness of breath Respiratory Failure  CHF.  She is getting prepped for discharge. Wears oxygen at night. Now has qualifiers for oxygen at 3L continuous. [facility] notified of qualifiers. Because patient lives in [place]  she will need transport tank from [facility] in [place], but delivery to her home from [facility] in [place]. # [phone]) [phone] Their fax is [phone].   They stated they need order, Qualifier notes, script( which should be the order) and chart notes faxed. I will send those over directly. Patient states there are no other needs for DME or home assistance.   Expected Discharge Plan: Home/Self Care Barriers to Discharge: Continued Medical Work up   Patient Goals and CMS Choice Patient states their goals for this hospitalization and ongoing recovery are:: Go home      Expected Discharge Plan and Services Expected Discharge Plan: Home/Self Care                           DME Agency: [facility] Date DME Agency Contacted: 12/11/19 Time DME Agency Contacted: [phone] Representative spoke with at DME Agency: Enzo Montgomery            Prior Living Arrangements/Services       Do you feel safe going back to the place where you live?: Yes      Need for Family Participation in Patient Care: Yes (Comment) Care giver support system in place?: Yes (comment) Current home services: DME Criminal Activity/Legal Involvement Pertinent to Current Situation/Hospitalization: No - Comment as needed  Activities of Daily Living      Permission Sought/Granted   Permission granted to share information with : Yes, Verbal Permission Granted     Permission granted to share  info w AGENCY: [employer]        Emotional Assessment Appearance:: Appears older than stated age Attitude/Demeanor/Rapport: Gracious Affect (typically observed): Accepting Orientation: : Oriented to Self, Oriented to Situation, Oriented to Place, Oriented to  Time Alcohol / Substance Use: Tobacco Use Psych Involvement: No (comment)  Admission diagnosis:  Shortness of breath [R06.02] CHF exacerbation (HCC) [I50.9] Congestive heart failure, unspecified HF chronicity, unspecified heart failure type [facility]) [I50.9] Patient Active Problem List   Diagnosis Date Noted  . CHF exacerbation ([place]) 12/08/2019  . Thrush 12/08/2019  . Diabetes mellitus without complication ([place])   . Mitral stenosis   . Elevated troponin   . Depression   . Anxiety   . Gastroesophageal reflux disease   . Chronic obstructive pulmonary disease ([place])   . Acute on chronic respiratory failure with hypoxia ([place]) 03/27/2019  . Shock ([place])   . Pressure injury of skin 09/02/2017  . Sepsis ([place]) 08/24/2017  . HCAP (healthcare-associated pneumonia) 08/24/2017  . Rectal bleeding   . Syncope 08/19/2017  . Hematemesis 08/19/2017  . Iron deficiency anemia due to chronic blood loss   . Esophageal dysphagia   . Acute on chronic diastolic CHF (congestive heart failure) ([place]) 06/12/2017  . Hematochezia 06/12/2017  . Macrocytic anemia 06/11/2017  . ARDS (adult respiratory distress syndrome) ([place]) 10/30/2014  . Bilateral pneumonia 10/29/2014  . CAP (community  acquired pneumonia) 10/29/2014  . Hypokalemia   . Hypoxia   . UTI (urinary tract infection)   . Tobacco abuse 10/11/2010  . Acute respiratory failure ([place]) 10/11/2010   PCP:  Glenda Chroman, MD Pharmacy:   [place], [place] - [address] [place] [address] Phone: [phone] Fax: [phone]     Social Determinants of Health (SDOH) Interventions    Readmission Risk Interventions No flowsheet data  found.

## 2019-12-11 NOTE — Progress Notes (Signed)
PROGRESS NOTE    Holly Figueroa  OHC:[id number] DOB: [birth year] DOA: 12/08/2019 PCP: Glenda Chroman, MD  Brief Narrative: 54 year old female with history of diabetes, hyperlipidemia, tobacco abuse, COPD, depression and anxiety, mitral valve replacement on Coumadin, GERD, obstructive sleep apnea, admitted with shortness of breath cough congestion and was found to be severely hypoxic. At baseline she uses 3 L of oxygen at night. She was treated as an outpatient with doxycycline with some improvement she finished a course of 10 days of doxy. She denies any fever. She reports using 8 pillows at night to sleep due to orthopnea. Chest x-ray in the ER showed diffuse pulmonary edema.  Assessment & Plan:   Principal Problem:   Acute on chronic respiratory failure with hypoxia (HCC) Active Problems:   Tobacco abuse   Macrocytic anemia   Gastroesophageal reflux disease   Chronic obstructive pulmonary disease (HCC)   CHF exacerbation (HCC)   Diabetes mellitus without complication (HCC)   Mitral stenosis   Elevated troponin   Thrush   Depression   Anxiety    1 acute on chronic hypoxic respiratory failure secondary to acute diastolic CHF exacerbation in the setting of COPD-she is admitted with shortness of breath cough orthopnea and hypoxia, BNP 319  TEE-no significant prosthetic valve stenosis Echo 6/16 with ejection fraction 45 to 50% I's and O's negative by  3290 from 2420 from 1150 Weights no weights recorded Chest x-ray with pulmonary edema Creatinine stable at 1.14.   Mildly elevated troponin 62-241-52 likely related to demand PT eval noted with desaturation to 83% with ambulation. 90% on 3 L.  2 history of essential hypertension-blood pressure 102/66.  Managed by cardiology.  3 hyperlipidemia on statin  4 type 2 diabetes continue SSI. On Metformin and glipizide at home. A1c is 6.5 CBG (last 3)  Recent Labs    12/10/19 2140 12/11/19 0738 12/11/19 1141  GLUCAP  241* 120* 105*          5 chronic macrocytic anemia TSH b 12 and folate are normal.  6 status post AVR and mitral valve replacement on Coumadin INR1.6  7 obstructive sleep apnea on oxygen at night at 3 L  8 tobacco abuse continue nicotine patch    Estimated body mass index is 28.32 kg/m as calculated from the following:   Height as of this encounter: 5' 6" (1.676 m).   Weight as of this encounter: 79.6 kg.  DVT prophylaxis:Coumadin/Lovenox  code Status:Full code Family Communication:None at bedside Disposition Plan:Status is: Inpatient  Dispo: The patient is from:Home Anticipated d/c is TV:GVSY Anticipated d/c date is: 1 to 2 days Patient currently is not medically stable to d/c.  Patient admitted with recurrent acute pulmonary edema/diastolic heart failure  Consultants: Cardiology  Procedures:None AntimicrobialsRocephin  Subjective:  She is sitting up in bed on 4 L of oxygen saturating 95%.  She feels better than yesterday however she is not yet back to her baseline. Objective: Vitals:   12/11/19 0136 12/11/19 0547 12/11/19 0754 12/11/19 1037  BP: (!) 113/52 (!) 110/44 (!) 103/58 102/66  Pulse: 64 63 62 67  Resp: _0 Temp: 97.8 F (36.6 C) 98.2 F (36.8 C) 98 F (36.7 C) 97.9 F (36.6 C)  TempSrc: Oral Oral Oral Oral  SpO2: 98% 100% 98% 99%  Weight: 79.6 kg     Height:        Intake/Output Summary (Last 24 hours) at 12/11/2019 1404 Last data filed at 12/11/2019 1258 Gross per 24  hour  Intake 880 ml  Output 1750 ml  Net -870 ml   Filed Weights   12/09/19 0514 12/10/19 0028 12/11/19 0136  Weight: 80.5 kg 79.9 kg 79.6 kg    Examination:  General exam: Appears calm and comfortable  Respiratory system: Crackles at bases to auscultation. Respiratory effort normal. Cardiovascular system: S1 & S2 heard, RRR. No JVD, murmurs, rubs, gallops or clicks. No pedal edema. Gastrointestinal  system: Abdomen is nondistended, soft and nontender. No organomegaly or masses felt. Normal bowel sounds heard. Central nervous system: Alert and oriented. No focal neurological deficits. Extremities: Symmetric 5 x 5 power. Skin: No rashes, lesions or ulcers Psychiatry: Judgement and insight appear normal. Mood & affect appropriate.     Data Reviewed: I have personally reviewed following labs and imaging studies  CBC: Recent Labs  Lab 12/08/19 0958 12/08/19 1249 12/09/19 0554 12/09/19 0554 12/10/19 0328 12/10/19 1625 12/10/19 1626 12/10/19 1629 12/11/19 0609  WBC 13.3*  --  8.8  --  6.7  --   --   --  11.1*  HGB 9.0*   < > 7.9*   < > 8.0* 9.2* 9.2* 8.8* 8.9*  HCT 29.0*   < > 25.3*   < > 25.8* 27.0* 27.0* 26.0* 28.2*  MCV 100.7*  --  99.6  --  99.2  --   --   --  97.9  PLT 266  --  242  --  291  --   --   --  379   < > = values in this interval not displayed.   Basic Metabolic Panel: Recent Labs  Lab 12/08/19 0958 12/08/19 1249 12/08/19 1501 12/09/19 0554 12/09/19 0554 12/10/19 0328 12/10/19 1625 12/10/19 1626 12/10/19 1629 12/11/19 0609  NA 135   < >  --  138   < > 138 141 141 141 135  K 3.5   < >  --  3.0*   < > 5.0 4.8 4.8 4.8 4.2  CL 100  --   --  99  --  101  --   --   --  100  CO2 23  --   --  29  --  27  --   --   --  26  GLUCOSE 285*  --   --  127*  --  101*  --   --   --  151*  BUN 9  --   --  10  --  17  --   --   --  18  CREATININE 1.02*  --   --  1.04*  --  1.34*  --   --   --  1.14*  CALCIUM 8.7*  --   --  8.2*  --  8.6*  --   --   --  9.3  MG  --   --  1.4*  --   --  2.4  --   --   --   --    < > = values in this interval not displayed.   GFR: Estimated Creatinine Clearance: 61.4 mL/min (A) (by C-G formula based on SCr of 1.14 mg/dL (H)). Liver Function Tests: No results for input(s): AST, ALT, ALKPHOS, BILITOT, PROT, ALBUMIN in the last 168 hours. No results for input(s): LIPASE, AMYLASE in the last 168 hours. No results for input(s): AMMONIA in  the last 168 hours. Coagulation Profile: Recent Labs  Lab 12/08/19 1501 12/09/19 0554 12/10/19 0328 12/11/19 0609  INR 1.4* 1.4* 1.5* 1.6*  Cardiac Enzymes: No results for input(s): CKTOTAL, CKMB, CKMBINDEX, TROPONINI in the last 168 hours. BNP (last 3 results) No results for input(s): PROBNP in the last 8760 hours. HbA1C: Recent Labs    12/08/19 1500  HGBA1C 6.5*   CBG: Recent Labs  Lab 12/10/19 1432 12/10/19 1710 12/10/19 2140 12/11/19 0738 12/11/19 1141  GLUCAP 89 134* 241* 120* 105*   Lipid Profile: No results for input(s): CHOL, HDL, LDLCALC, TRIG, CHOLHDL, LDLDIRECT in the last 72 hours. Thyroid Function Tests: Recent Labs    12/08/19 1501  TSH 0.654   Anemia Panel: Recent Labs    12/08/19 1501 12/09/19 1630  VITAMINB12 330 301  FOLATE 7.9 8.1  FERRITIN  --  938*  TIBC  --  248*  IRON  --  39  RETICCTPCT  --  3.0   Sepsis Labs: Recent Labs  Lab 12/08/19 1500 12/08/19 1501 12/08/19 1646  PROCALCITON  --  0.21  --   LATICACIDVEN 2.8*  --  1.7    Recent Results (from the past 240 hour(s))  SARS Coronavirus 2 by RT PCR (hospital order, performed in [facility] hospital lab) Nasopharyngeal Nasopharyngeal Swab     Status: None   Collection Time: 12/08/19 12:56 PM   Specimen: Nasopharyngeal Swab  Result Value Ref Range Status   SARS Coronavirus 2 NEGATIVE NEGATIVE Final    Comment: (NOTE) SARS-CoV-2 target nucleic acids are NOT DETECTED.  The SARS-CoV-2 RNA is generally detectable in upper and lower respiratory specimens during the acute phase of infection. The lowest concentration of SARS-CoV-2 viral copies this assay can detect is 250 copies / mL. A negative result does not preclude SARS-CoV-2 infection and should not be used as the sole basis for treatment or other patient management decisions.  A negative result may occur with improper specimen collection / handling, submission of specimen other than nasopharyngeal swab, presence of  viral mutation(s) within the areas targeted by this assay, and inadequate number of viral copies (<250 copies / mL). A negative result must be combined with clinical observations, patient history, and epidemiological information.  Fact Sheet for Patients:   StrictlyIdeas.no  Fact Sheet for Healthcare Providers: BankingDealers.co.za  This test is not yet approved or  cleared by the [place] FDA and has been authorized for detection and/or diagnosis of SARS-CoV-2 by FDA under an Emergency Use Authorization (EUA).  This EUA will remain in effect (meaning this test can be used) for the duration of the COVID-19 declaration under Section 564(b)(1) of the Act, 21 U.S.C. section 360bbb-3(b)(1), unless the authorization is terminated or revoked sooner.  Performed at [facility], [place] [address], [place], [place] [address]   Culture, blood (routine x 2)     Status: None (Preliminary result)   Collection Time: 12/08/19  3:05 PM   Specimen: BLOOD RIGHT HAND  Result Value Ref Range Status   Specimen Description BLOOD RIGHT HAND  Final   Special Requests   Final    BOTTLES DRAWN AEROBIC AND ANAEROBIC Blood Culture results may not be optimal due to an inadequate volume of blood received in culture bottles   Culture   Final    NO GROWTH 3 DAYS Performed at [facility], [place] [address], [place], [place] [address]    Report Status PENDING  Incomplete  Culture, blood (routine x 2)     Status: None (Preliminary result)   Collection Time: 12/08/19  4:51 PM   Specimen: BLOOD LEFT ARM  Result Value Ref  Range Status   Specimen Description BLOOD LEFT ARM  Final   Special Requests   Final    BOTTLES DRAWN AEROBIC ONLY Blood Culture adequate volume   Culture   Final    NO GROWTH 3 DAYS Performed at [facility], [address] [place], [place] [address]    Report Status PENDING  Incomplete  Culture, Urine     Status: Abnormal    Collection Time: 12/08/19  5:16 PM   Specimen: Urine, Random  Result Value Ref Range Status   Specimen Description URINE, RANDOM  Final   Special Requests   Final    NONE Performed at [facility], [place] [address], [place], [place] [address]    Culture (A)  Final    >=100,000 COLONIES/mL ESCHERICHIA COLI Confirmed Extended Spectrum Beta-Lactamase Producer (ESBL).  In bloodstream infections from ESBL organisms, carbapenems are preferred over piperacillin/tazobactam. They are shown to have a lower risk of mortality.    Report Status 12/10/2019 FINAL  Final   Organism ID, Bacteria ESCHERICHIA COLI (A)  Final      Susceptibility   Escherichia coli - MIC*    AMPICILLIN >=32 RESISTANT Resistant     CEFAZOLIN >=64 RESISTANT Resistant     CEFTRIAXONE >=64 RESISTANT Resistant     CIPROFLOXACIN 0.5 SENSITIVE Sensitive     GENTAMICIN >=16 RESISTANT Resistant     IMIPENEM <=0.25 SENSITIVE Sensitive     NITROFURANTOIN <=16 SENSITIVE Sensitive     TRIMETH/SULFA >=320 RESISTANT Resistant     AMPICILLIN/SULBACTAM >=32 RESISTANT Resistant     PIP/TAZO <=4 SENSITIVE Sensitive     * >=100,000 COLONIES/mL ESCHERICHIA COLI         Radiology Studies: CARDIAC CATHETERIZATION  Addendum Date: 12/11/2019   Normal coronary arteries. No CAD. 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 No CAD. 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 Exam Location:  Inpatient Procedure: 2D Echo, Color Doppler and Cardiac Doppler Indications:    I50.9* Heart failure (unspecified)  History:        Patient has prior history of Echocardiogram examinations, most                 recent 03/28/2019. CHF, COPD; Risk  Factors:Diabetes. 11/01/17                 Aortic Valve replaced with 59m Regent Bileaflet Mechanical                 Prosthetic and Mitral Valve replaced with 255mRegent Bileaflet                 Mechanical Prosthetic.                  Mitral Valve: 25 mm mechanical valve valve is present in the                 mitral position.  Sonographer:    EmRaquel Sarnaenior RDCS Referring Phys: [id number]. Left ventricular ejection fraction, by estimation, is 55 to 60%. The left ventricle has normal function. The left ventricle has no regional wall motion abnormalities. There is mild concentric left ventricular hypertrophy. Left ventricular diastolic parameters are indeterminate. Elevated left ventricular end-diastolic pressure.  2. Right ventricular systolic function is normal. The right ventricular size is normal. There is normal pulmonary artery systolic pressure. The estimated right ventricular systolic pressure is 3356.2mHg.  3. Left atrial size was mildly dilated.  4. The mitral valve has been repaired/replaced. No evidence of mitral stenosis. There is a 25 mm mechanical valve present in the mitral position. Echo findings are consistent with normal structure and function of the mitral valve prosthesis. Mildly increased gradient across MV with MV peak gradient, 15.6 mmHg. The mean mitral valve gradient is 5 mmHg. Mitral regurgitation cannot be assessed due to LA shadowing from mechanical MV mitral valve regurgitation.  5. The aortic valve has been repaired/replaced. There is a mechanical valve present in the  aortic position. Echo findings are consistent with normal structure and function of the aortic valve prosthesis. Trivial perivalvular AI is present. There is a mild gradient across the AV with mean AVG 2551m.  6. The inferior vena cava is normal in size with greater than 50% respiratory variability, suggesting right atrial pressure of 3 mmHg.  7. COmpared to echo of 03/2019, the mean MVG of MV mechanical prosthesis has improved from 7 to 5mm13m The mean AVG of the AV mechanical prosthesis has also improved from 29 to 25mm59mFINDINGS  Left Ventricle: Left ventricular ejection fraction, by estimation, is 55 to 60%. The left ventricle has normal function. The left ventricle has no regional wall motion abnormalities. The left ventricular internal cavity size was normal in size. There is  mild concentric left ventricular hypertrophy. Left ventricular diastolic parameters are indeterminate. Elevated left ventricular end-diastolic pressure. Right Ventricle: The right ventricular size is normal. No increase in right ventricular wall thickness. Right ventricular systolic function is normal. There is normal pulmonary artery systolic pressure. The tricuspid regurgitant velocity is 2.74 m/s, and  with an assumed right atrial pressure of 3 mmHg, the estimated right ventricular systolic pressure is 33.0 56.3. Left Atrium: Left atrial size was mildly dilated. Right Atrium: Right atrial size was normal in size. Pericardium: There is no evidence of pericardial effusion. Mitral Valve: The mitral valve has been repaired/replaced. Mitral regurgitation cannot be assessed due to LA shadowing from mechanical MV mitral valve regurgitation. There is a 25 mm mechanical valve present in the mitral position. Echo findings are consistent with normal structure and function of the mitral valve prosthesis. No evidence of mitral valve stenosis. MV peak gradient, 15.6 mmHg. The mean mitral valve gradient is 5.0 mmHg.  Tricuspid Valve: The tricuspid  valve is normal in structure. Tricuspid valve regurgitation is mild . No evidence of tricuspid stenosis. Aortic Valve: The aortic valve has been repaired/replaced. Aortic valve regurgitation Trivial perivalvular AI. No aortic stenosis is present. Aortic valve mean gradient measures 25.0 mmHg. Aortic valve peak gradient measures 52.1 mmHg. Aortic valve area,  by VTI measures 0.95 cm. There is a mechanical valve present in the aortic position. Echo findings are consistent with normal structure and function of the aortic valve prosthesis. Pulmonic Valve: The pulmonic valve was normal in structure. Pulmonic valve regurgitation is not visualized. No evidence of pulmonic stenosis. Aorta: The aortic root is normal in size and structure. Venous: The inferior vena cava is normal in size with greater than 50% respiratory variability, suggesting right atrial pressure of 3 mmHg. IAS/Shunts: No atrial level shunt detected by color flow Doppler.  LEFT VENTRICLE PLAX 2D LVIDd:         3.50 cm  Diastology LVIDs:         2.50 cm  LV e' lateral:   5.44 cm/s LV PW:         1.40 cm  LV E/e' lateral: 33.6 LV IVS:        1.20 cm  LV e' medial:    4.79 cm/s LVOT diam:     1.70 cm  LV E/e' medial:  38.2 LV SV:         75 LV SV Index:   40 LVOT Area:     2.27 cm  RIGHT VENTRICLE RV S prime:     5.55 cm/s TAPSE (M-mode): 1.6 cm LEFT ATRIUM             Index       RIGHT ATRIUM           Index LA Vol (A2C):   65.5 ml 34.46 ml/m RA Area:     16.70 cm LA Vol (A4C):   77.0 ml 40.51 ml/m RA Volume:   45.40 ml  23.89 ml/m LA Biplane Vol: 72.4 ml 38.09 ml/m  AORTIC VALVE AV Area (Vmax):    0.76 cm AV Area (Vmean):   0.88 cm AV Area (VTI):     0.95 cm AV Vmax:           361.00 cm/s AV Vmean:          229.000 cm/s AV VTI:            0.797 m AV Peak Grad:      52.1 mmHg AV Mean Grad:      25.0 mmHg LVOT Vmax:         121.00 cm/s LVOT Vmean:        89.200 cm/s LVOT VTI:          0.332 m LVOT/AV VTI ratio: 0.42  AORTA Ao Asc diam: 2.60 cm  MITRAL VALVE                TRICUSPID VALVE MV Area (PHT): 3.48 cm     TR Peak grad:   30.0 mmHg MV Peak grad:  15.6 mmHg    TR Vmax:        274.00 cm/s MV Mean grad:  5.0 mmHg MV Vmax:       1.98 m/s     SHUNTS MV Vmean:      93.0 cm/s    Systemic VTI:  0.33 m MV Decel Time: 218 msec     Systemic Diam: 1.70 cm MV E velocity:  183.00 cm/s MV A velocity: 52.80 cm/s MV E/A ratio:  3.47 Fransico Him MD Electronically signed by Fransico Him MD Signature Date/Time: 12/09/2019/2:53:13 PM    Final    ECHO TEE  Result Date: 12/10/2019    TRANSESOPHOGEAL ECHO REPORT   Patient Name:   EMUNAH TEXIDOR Date of Exam: 12/10/2019 Medical Rec #:  [id number]           Height:       66.0 in Accession #:    [id number]          Weight:       176.2 lb Date of Birth:  21-Oct-1965            BSA:          1.895 m Patient Age:    19 years            BP:           114/52 mmHg Patient Gender: F                   HR:           82 bpm. Exam Location:  Inpatient Procedure: Transesophageal Echo, Color Doppler and Cardiac Doppler Indications:     Aortic Stenosis  History:         Patient has prior history of Echocardiogram examinations, most                  recent 12/09/2019. CHF, COPD; Risk Factors:Diabetes. 11/01/17                  Aortic Valve replaced with 60m Regent Bileaflet Mechanical                  Prosthetic and Mitral Valve replaced with 285mRegent Bileaflet                  Mechanical Prosthetic.                  Aortic Valve: 19 mm Regent Mechanical valve valve is present in                  the aortic position. Procedure Date: 2019.                  Mitral Valve: 25 mm Regent mechanical valve mechanical valve                  valve is present in the mitral position.  Sonographer:     EmRaquel Sarnaenior RDCS Referring Phys:  [id number] BowenATWARDHAN Diagnosing Phys: MaVernell LeepD PROCEDURE: After discussion of the risks and benefits of a TEE, an informed consent was obtained from the patient. The transesophogeal probe was  passed without difficulty through the esophogus of the patient. Sedation performed by different physician. The patient was monitored while under deep sedation. Anesthestetic sedation was provided intravenously by Anesthesiology: [id number] Propofol. The patient developed no complications during the procedure. IMPRESSIONS  1. Left ventricular ejection fraction, by estimation, is 55 to 60%. The left ventricle has normal function. Abnormal septal motion due to post op status.  2. Right ventricular systolic function is normal. The right ventricular size is normal. There is mildly elevated pulmonary artery systolic pressure.  3. Left atrial size was moderately dilated. No left atrial/left atrial appendage thrombus was detected.  4. The mitral valve is abnormal. Mild mitral valve regurgitation. There is a 25 mm Regent mechanical valve mechanical  valve present in the mitral position. Mean PG 7 mmHg, uncahnged compared to post op echocardiogram in 2019.  5. The aortic valve is abnormal. Aortic valve regurgitation Trivial paravalvular leak. There is a 19 mm Regent Mechanical valve valve present in the aortic position. Procedure Date: 2019. Mean PG 21 mmHg, Vmax 2.9 m/sec- uncahnged compared to post op echocardiogram in 2019. No pannus, dehiscence. Acceleration time Minimally over 100 msec. No significant prosthetic valve stenosis seen. Moderate tricuspid regurgitation. Estimated RVSP 40 mmhg FINDINGS  Left Ventricle: Left ventricular ejection fraction, by estimation, is 55 to 60%. The left ventricle has normal function. The left ventricle demonstrates regional wall motion abnormalities. The left ventricular internal cavity size was normal in size. There is no left ventricular hypertrophy. Abnormal (paradoxical) septal motion consistent with post-operative status. Right Ventricle: The right ventricular size is normal. No increase in right ventricular wall thickness. Right ventricular systolic function is normal. There is mildly  elevated pulmonary artery systolic pressure. The tricuspid regurgitant velocity is 3.05  m/s, and with an assumed right atrial pressure of 3 mmHg, the estimated right ventricular systolic pressure is 96.0 mmHg. Left Atrium: Left atrial size was moderately dilated. No left atrial/left atrial appendage thrombus was detected. Right Atrium: Right atrial size was normal in size. Pericardium: There is no evidence of pericardial effusion. Mitral Valve: The mitral valve is abnormal. Mild mitral valve regurgitation. There is a 25 mm Regent mechanical valve mechanical valve present in the mitral position. MV peak gradient, 20.8 mmHg. The mean mitral valve gradient is 5.7 mmHg. Tricuspid Valve: The tricuspid valve is grossly normal. Tricuspid valve regurgitation is moderate. Aortic Valve: The aortic valve is abnormal. Aortic valve regurgitation Trivial paravalvular leak. Aortic valve mean gradient measures 21.0 mmHg. Aortic valve peak gradient measures 35.8 mmHg. There is a 19 mm Regent Mechanical valve valve present in the aortic position. Procedure Date: 2019. Pulmonic Valve: The pulmonic valve was grossly normal. Pulmonic valve regurgitation is not visualized. Aorta: The aortic root is normal in size and structure. IAS/Shunts: No atrial level shunt detected by color flow Doppler.  AORTIC VALVE AV Vmax:      299.00 cm/s AV Vmean:     222.000 cm/s AV VTI:       0.680 m AV Peak Grad: 35.8 mmHg AV Mean Grad: 21.0 mmHg MITRAL VALVE            TRICUSPID VALVE MV Peak grad: 20.8 mmHg TR Peak grad:   37.2 mmHg MV Mean grad: 5.7 mmHg  TR Vmax:        305.00 cm/s MV Vmax:      2.28 m/s MV Vmean:     96.8 cm/s Vernell Leep MD Electronically signed by Vernell Leep MD Signature Date/Time: 12/10/2019/5:12:33 PM    Final         Scheduled Meds: . ALPRAZolam  0.5 mg Oral TID  . dextromethorphan-guaiFENesin  1 tablet Oral BID  . enoxaparin (LOVENOX) injection  80 mg Subcutaneous Q12H  . FLUoxetine  60 mg Oral Daily  .  insulin aspart  0-15 Units Subcutaneous TID WC  . insulin aspart  0-5 Units Subcutaneous QHS  . metoprolol tartrate  50 mg Oral BID  . nystatin  5 mL Oral QID  . pantoprazole  40 mg Oral QPC breakfast  . rosuvastatin  10 mg Oral QPC breakfast  . sacubitril-valsartan  1 tablet Oral BID  . sodium chloride flush  3 mL Intravenous Q12H  . sodium chloride flush  3 mL Intravenous  Q12H  . sodium chloride flush  3 mL Intravenous Q12H  . spironolactone  12.5 mg Oral Daily  . traZODone  100 mg Oral QHS  . warfarin  7.5 mg Oral ONCE-1600  . Warfarin - Pharmacist Dosing Inpatient   Does not apply q1600   Continuous Infusions: . sodium chloride    . sodium chloride       LOS: 3 days     Georgette Shell, MD  12/11/2019, 2:04 PM

## 2019-12-11 NOTE — Progress Notes (Signed)
ANTICOAGULATION CONSULT NOTE -  Follow up  Pharmacy Consult for warfarin Indication: mechanical MVR, AVR  Allergies  Allergen Reactions  . Iodinated Diagnostic Agents Anaphylaxis  . Aspirin Other (See Comments)    "Possible blood in stool" the 340m. Can take 816m  . Ibuprofen Other (See Comments)    "Possible blood in stool"  . Sulfa Antibiotics Hives  . Zofran [Ondansetron Hcl]     Bad headaches  . Dilaudid [Hydromorphone Hcl] Itching and Palpitations  . Hydrocodone Palpitations    Patient Measurements: Height: 5' 6" (167.6 cm) Weight: 79.6 kg (175 lb 7.8 oz) IBW/kg (Calculated) : 59.3 Heparin (IV) Dosing Weight: 76 kg  Vital Signs: Temp: 98 F (36.7 C) (06/18 0754) Temp Source: Oral (06/18 0754) BP: 103/58 (06/18 0754) Pulse Rate: 62 (06/18 0754)  Labs: Recent Labs     0000 12/08/19 1247 12/08/19 1249 12/08/19 1501 12/08/19 1501 12/08/19 1646 12/09/19 0554 12/09/19 0554 12/10/19 0328 12/10/19 1625 12/10/19 1626 12/10/19 1626 12/10/19 1629 12/11/19 0609  HGB  --   --    < >  --    < >  --  7.9*   < > 8.0*   < > 9.2*   < > 8.8* 8.9*  HCT  --   --    < >  --    < >  --  25.3*   < > 25.8*   < > 27.0*  --  26.0* 28.2*  PLT   < >  --   --   --   --   --  242  --  291  --   --   --   --  379  APTT  --   --   --  48*  --   --   --   --   --   --   --   --   --   --   LABPROT  --   --   --  16.7*   < >  --  17.0*  --  17.9*  --   --   --   --  18.9*  INR  --   --   --  1.4*   < >  --  1.4*  --  1.5*  --   --   --   --  1.6*  CREATININE   < >  --   --   --   --   --  1.04*  --  1.34*  --   --   --   --  1.14*  TROPONINIHS  --  60*  --   --   --  241* 52*  --   --   --   --   --   --   --    < > = values in this interval not displayed.    Estimated Creatinine Clearance: 61.4 mL/min (A) (by C-G formula based on SCr of 1.14 mg/dL (H)).   Medical History: Past Medical History:  Diagnosis Date  . Acute respiratory failure ([facility]  . Anemia 08-2008   Blood  transfusion  . Anxiety   . Benzodiazepine dependence ([facility]  . Cervical cancer ([facility]  . CHF (congestive heart failure) ([facility]  . Depression   . Diabetes mellitus without complication (H[facility]  . Headache(784.0)    migraines  . Hypokalemia   . Legionella pneumonia ([facility]  . Leukocytosis, unspecified   . Mitral stenosis   .  Panic attacks   . Tobacco abuse     Medications:  Scheduled:  . ALPRAZolam  0.5 mg Oral TID  . dextromethorphan-guaiFENesin  1 tablet Oral BID  . enoxaparin (LOVENOX) injection  80 mg Subcutaneous Q12H  . FLUoxetine  60 mg Oral Daily  . insulin aspart  0-15 Units Subcutaneous TID WC  . insulin aspart  0-5 Units Subcutaneous QHS  . metoprolol tartrate  50 mg Oral BID  . nystatin  5 mL Oral QID  . pantoprazole  40 mg Oral QPC breakfast  . rosuvastatin  10 mg Oral QPC breakfast  . sacubitril-valsartan  1 tablet Oral BID  . sodium chloride flush  3 mL Intravenous Q12H  . sodium chloride flush  3 mL Intravenous Q12H  . sodium chloride flush  3 mL Intravenous Q12H  . spironolactone  12.5 mg Oral Daily  . traZODone  100 mg Oral QHS  . Warfarin - Pharmacist Dosing Inpatient   Does not apply q1600    Assessment: 10 yof with AVR, mechanical MVR on warfarin PTA. PTA dose 5 mg daily per CPhT med history. Last dose 6/13. INR subtherapeutic at 1.4 on admit.   Patient reported last PTA warfarin dose taken 6/13,  missed dose 6/14 PTA -warfarin 47m dose was given on 12/08/19.    INR remains subtherapeutic at 1.6, slowly increasing toward goal 2.5-3.5.  Continues on Lovenox 161mkg q12h bridge until INR therapeutic.   Hgb 8.9, plt 379. No s/sx of bleeding documented.   Goal of Therapy:  INR 2.5-3.5 Monitor platelets by anticoagulation protocol: Yes   Plan:  Warfarin 7.5 mg PO x1 tonight  Continue enoxaparin to 80 mg (1 mg/kg) [place] Q12h until therapeutic INR   Daily INR, CBC Monitor for bleeding   KiAntonietta JewelPharmD, BCNeogaharmacist  Phone:  [phone]/18/2021 8:02 AM  Please check AMION for all [facility] numbers After 10:00 PM, call [phone]

## 2019-12-11 NOTE — Progress Notes (Signed)
Occupational Therapy Treatment Patient Details Name: Holly Figueroa MRN: [id number] DOB: 29-Apr-1966 Today's Date: 12/11/2019    History of present illness 54 y.o. female with medical history significant of 1, hyperlipidemia, diabetes mellitus, depression/anxiety, tobacco abuse, MVR on Coumadin therapy, GERD, obesity presents to emergency department due to worsening shortness of breath, cough, congestion and hypoxia.   OT comments  Pt progressing towards OT goals, presents supine in bed pleasant and willing to participate in therapy session. Pt tolerating room level mobility without AD, toileting and multiple standing ADL tasks at sink with overall supervision throughout. Pt with no DOE noted today; pt initially on supplemental O2 (2-3L) with SpO2 >90%, trialled pt on RA with SpO2 decreasing to 85% with additional standing/room level activity, reapplied O2 with SpO2 100% seated EOB end of session. Additional education provided re: activity progression and energy conservation strategies during completion of ADL and mobility tasks after return home with pt verbalizing understanding. Will continue per POC at this time.    Follow Up Recommendations  No OT follow up    Equipment Recommendations  Tub/shower seat          Precautions / Restrictions Precautions Precautions: Fall Precaution Comments: monitor O2 Restrictions Weight Bearing Restrictions: No       Mobility Bed Mobility Overal bed mobility: Modified Independent                Transfers Overall transfer level: Needs assistance Equipment used: None Transfers: Sit to/from Stand Sit to Stand: Supervision;Modified independent (Device/Increase time)         General transfer comment: supervision for lines/safety    Balance Overall balance assessment: Mild deficits observed, not formally tested                                         ADL either performed or assessed with clinical judgement    ADL Overall ADL's : Needs assistance/impaired     Grooming: Wash/dry hands;Wash/dry face;Oral care;Supervision/safety;Standing   Upper Body Bathing: Supervision/ safety;Standing Upper Body Bathing Details (indicate cue type and reason): standing at sink in room Lower Body Bathing: Supervison/ safety;Min guard;Sit to/from stand Lower Body Bathing Details (indicate cue type and reason): standing at sink in room Upper Body Dressing : Set up;Sitting   Lower Body Dressing: Supervision/safety;Sit to/from stand Lower Body Dressing Details (indicate cue type and reason): donning mesh briefs seated EOB, pt able to doff previous pair of briefs in standing with single UE support on sink Toilet Transfer: Supervision/safety;Ambulation;Regular Toilet   Toileting- Water quality scientist and Hygiene: Supervision/safety;Sit to/from stand;Sitting/lateral lean Toileting - Clothing Manipulation Details (indicate cue type and reason): performing pericare and clothing management (gown/underwear)      Functional mobility during ADLs: Supervision/safety General ADL Comments: pt with improved dyspnea today with room level/standing activity.                        Cognition Arousal/Alertness: Awake/alert Behavior During Therapy: WFL for tasks assessed/performed Overall Cognitive Status: Within Functional Limits for tasks assessed                                           Prior Functioning/Environment              Frequency  Min 2X/week  Progress Toward Goals  OT Goals(current goals can now be found in the care plan section)  Progress towards OT goals: Progressing toward goals  Acute Rehab OT Goals Patient Stated Goal: home soon OT Goal Formulation: With patient Time For Goal Achievement: 12/23/19 Potential to Achieve Goals: Good ADL Goals Pt Will Transfer to Toilet: Independently;ambulating Pt Will Perform Tub/Shower Transfer: with modified  independence;shower seat;Shower transfer;ambulating Additional ADL Goal #1: Patient will utilize pursed lip breathing techniques during self care tasks with less than 25% cues. Additional ADL Goal #2: Patient will identify 3 energy conservation strategies to implement at home to maximize safety and independence with daily routine.  Plan Discharge plan remains appropriate    Co-evaluation                 AM-PAC OT "6 Clicks" Daily Activity     Outcome Measure   Help from another person eating meals?: None Help from another person taking care of personal grooming?: A Little Help from another person toileting, which includes using toliet, bedpan, or urinal?: A Little Help from another person bathing (including washing, rinsing, drying)?: A Little Help from another person to put on and taking off regular upper body clothing?: None Help from another person to put on and taking off regular lower body clothing?: A Little 6 Click Score: 20    End of Session Equipment Utilized During Treatment: Oxygen  OT Visit Diagnosis: Other abnormalities of gait and mobility (R26.89)   Activity Tolerance Patient tolerated treatment well   Patient Left with call bell/phone within reach;Other (comment) (seated EOB, handoff to PT for session)   Nurse Communication Mobility status        Time: 8648-4720 OT Time Calculation (min): 22 min  Charges: OT General Charges $OT Visit: 1 Visit OT Treatments $Self Care/Home Management : 8-22 mins  Lou Cal, OT Acute Rehabilitation Services Pager [phone] Office [phone]    Raymondo Band 12/11/2019, 11:03 AM

## 2019-12-11 NOTE — Progress Notes (Signed)
SATURATION QUALIFICATIONS: (This note is used to comply with regulatory documentation for home oxygen)  Patient Saturations on Room Air at Rest = 85%  Patient Saturations on Room Air while Ambulating = NT as pt desats at rest  Patient Saturations on 3L Liters of oxygen while Ambulating = 90%  Please briefly explain why patient needs home oxygen:Pt desats on RA at rest and needs O2 at rest and with activity.  Renae Mottley W,PT Acute Rehabilitation Services Pager:  [phone]  Office:  [phone]

## 2019-12-11 NOTE — Care Management (Signed)
For home use only DME oxygen (Order [id number]) General Supply Date: 12/11/2019 Department: [facility] Ordering/Authorizing: Georgette Shell, MD   Georgette Shell, MD NPI: [id number]    Patient Information  Patient Name  Velva, Molinari Legal Sex  Female DOB  05/23/1966 SSN  [id number]  Order Information  Order Date/Time Release Date/Time Start Date/Time End Date/Time  12/11/19 03:03 PM None 12/11/19 03:03 PM 12/11/19 03:03 PM  Order History Inpatient Date/Time Action Taken User Additional Information  12/11/19 1503 Sign Verdell Carmine, RN Ordering Mode: Verbal with Read Back: Cosign Required  12/11/19 1503 Release Instance Verdell Carmine, RN (auto-released) Released [phone]  Order Questions  Question Answer  Length of Need Lifetime  Mode or (Route) Nasal cannula  Liters per Minute 3  Frequency Continuous (stationary and portable oxygen unit needed)  Oxygen conserving device Yes  Oxygen delivery system Gas      Process Instructions  Verify pulse oximetry with O2 sat less than or equal to 88% on RA or Arterial Blood Gas (ABG) with pO2 12mhg or below.   If pulse oximetry is performed during ambulation or exertion, the following must be documented:  . O2 sat on RA at rest  . O2 sat on RA while ambulating/during exertion  . O2 sat on oxygen while ambulating/during exertion   Reference Links      Standing Order Information  Remaining Occurrences Interval Last Released    0/1 Once 12/11/2019        For home use only DME oxygen: Patient Communication  Not Released Not seen  Collection Information   Encounter  View Encounter        Verbal Order Info  Action Created on Order Mode Entered by Comment Responsible Provider Signed by Signed on  Ordering 12/11/19 1503 Verbal with Read Back: Cosign Required CVerdell Carmine RN  MGeorgette Shell MD    Tracking Links Cosign Tracking Order Transmittal  Tracking

## 2019-12-11 NOTE — TOC Progression Note (Signed)
Transition of Care [facility]) - Progression Note    Patient Details  Name: Holly Figueroa MRN: [id number] Date of Birth: 08-17-1965  [facility]) CM/SW [place], RN Phone Number: 12/11/2019, 3:33 PM  Clinical Narrative:    Hassie Bruce notes, order, chart notes all faxed to [employer] fax [phone]. Called them back to confirm the faxes were received They have not received but will call me back when they received them. .    Expected Discharge Plan: Home/Self Care Barriers to Discharge: Continued Medical Work up  Expected Discharge Plan and Services Expected Discharge Plan: Home/Self Care                           DME Agency: [facility] Date DME Agency Contacted: 12/11/19 Time DME Agency Contacted: [phone] Representative spoke with at DME Agency: Enzo Montgomery             Social Determinants of Health (Assumption) Interventions    Readmission Risk Interventions No flowsheet data found.

## 2019-12-11 NOTE — CV Procedure (Signed)
TEE: Under deep sedation, TEE was performed without complications: No significant prosthetic valve stenosis Tolerated procedure well See TEE report for more details  Claudis Giovanelli Esther Hardy, MD [facility] [place] Pager: [phone] Office: [phone]

## 2019-12-11 NOTE — Addendum Note (Signed)
Addendum  created 12/11/19 1503 by Belinda Block, MD   Clinical Note Signed

## 2019-12-11 NOTE — Progress Notes (Signed)
Subjective:  She is feeling much improved, orthopnea has also improved.  No chest pain or palpitations.  Intake/Output from previous day:  I/O last 3 completed shifts: In: 730 [P.O.:480; I.V.:250] Out: 2000 [Urine:2000] Total I/O In: 240 [P.O.:240] Out: 1600 [Urine:1600]  Blood pressure (!) 110/44, pulse 63, temperature 98.2 F (36.8 C), temperature source Oral, resp. rate 19, height _0  (1.676 m), weight 79.6 kg, SpO2 100 %. Physical Exam Cardiovascular:     Rate and Rhythm: Normal rate and regular rhythm.     Pulses: Intact distal pulses.     Heart sounds: Murmur heard.  Harsh midsystolic murmur is present with a grade of 3/6 at the upper right sternal border radiating to the neck.  No gallop.      Comments: Crisp mechanical S1 and S2 heard. No leg edema, no JVD. Pulmonary:     Effort: Pulmonary effort is normal.     Breath sounds: Normal breath sounds.  Abdominal:     General: Bowel sounds are normal.     Palpations: Abdomen is soft.    Lab Results: BMP BNP (last 3 results) Recent Labs    03/28/19 0248 12/08/19 1230  BNP 162.0* 319.5*    BMP Latest Ref Rng & Units 12/10/2019 12/10/2019 12/10/2019  Glucose 70 - 99 mg/dL - - -  BUN 6 - 20 mg/dL - - -  Creatinine 0.44 - 1.00 mg/dL - - -  Sodium 135 - 145 mmol/L 141 141 141  Potassium 3.5 - 5.1 mmol/L 4.8 4.8 4.8  Chloride 98 - 111 mmol/L - - -  CO2 22 - 32 mmol/L - - -  Calcium 8.9 - 10.3 mg/dL - - -   Hepatic Function Latest Ref Rng & Units 03/28/2019 08/24/2017 08/21/2017  Total Protein 6.5 - 8.1 g/dL 7.3 7.1 7.5  Albumin 3.5 - 5.0 g/dL 3.3(L) 3.1(L) 3.2(L)  AST 15 - 41 U/L 22 48(H) 21  ALT 0 - 44 U/L 23 42 21  Alk Phosphatase 38 - 126 U/L 117 76 78  Total Bilirubin 0.3 - 1.2 mg/dL 0.7 0.7 0.4  Bilirubin, Direct 0.1 - 0.5 mg/dL - - -   CBC Latest Ref Rng & Units 12/11/2019 12/10/2019 12/10/2019  WBC 4.0 - 10.5 K/uL 11.1(H) - -  Hemoglobin 12.0 - 15.0 g/dL 8.9(L) 8.8(L) 9.2(L)  Hematocrit 36 - 46 % 28.2(L)  26.0(L) 27.0(L)  Platelets 150 - 400 K/uL 379 - -   Lipid Panel     Component Value Date/Time   CHOL 98 08/20/2017 0524   TRIG 121 09/09/2017 1540   HDL 34 (L) 08/20/2017 0524   CHOLHDL 2.9 08/20/2017 0524   VLDL 22 08/20/2017 0524   LDLCALC 42 08/20/2017 0524   Cardiac Panel (last 3 results) No results for input(s): CKTOTAL, CKMB, TROPONINI, RELINDX in the last 72 hours.  HEMOGLOBIN A1C Lab Results  Component Value Date   HGBA1C 6.5 (H) 12/08/2019   MPG 139.85 12/08/2019   TSH Recent Labs    03/28/19 0240 12/08/19 1501  TSH 0.415 0.654   Imaging: DG Chest 2 View  Result Date: 12/08/2019 CLINICAL DATA:  Chest pain and shortness of breath for 2 days. Hypoxia. EXAM: CHEST - 2 VIEW COMPARISON:  03/27/2019 FINDINGS: Stable mild cardiomegaly. 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 Diffuse interstitial and airspace disease shows mild increase since previous study, most likely due to diffuse pulmonary edema. No evidence of pleural effusion. IMPRESSION: Mild increase in diffuse interstitial and airspace disease, likely due to diffuse  pulmonary edema. Electronically Signed   By: Marlaine Hind M.D.   On: 12/08/2019 10:24  Cardiac Studies: Valve replacement at [facility] 04/03/2019: Mechanical prosthetic AV 12m ST JUDE REGENT STANDARD MECH PROSTHETIC MV: 262mST JUDE REGENT STANDARD   [facility] LVH NORMAL RIGHT VENTRICULAR SYSTOLIC FUNCTION VALVULAR REGURGITATION: TRIVIAL PR, MILD TR PROSTHETIC VALVE(S): MECH PROSTHETIC AoV, MECH PROSTHETIC, peak velocity 3.5 m/s, peak pressure gradient 48 and mean pressure gradient 25 mmHg.  Dimensionless index 0.49. ESTIMATED RVSP AT LEAST 3090m THERE APPEARS TO BE AT LEAST MILD MR, THOUGH MECHANICAL MITRAL VALVE MV, peak 5m37mand mean 7 mmHg. PREVENTS ACCURATE ASSESSMENT. PA ACCELERATION TIME OF 60 msec IS CONSISTENT WITH ELEVATED PA PRESSURES. ESTIMATED PA MEAN IS  53mm83m RHC 1[place]2/20 State: Baseline RA: 14 mmHg (mean) RV: 54/ 11 mmHg PA: 54/ 25 36 mmHg (mean) PCW: 22 mmHg (mean) AV O2: 3.8 vol% Cardiac output: 6.0 L/min Cardiac index: 3.1 L/min-m2 PVR: 2.3 Wood units _____________________  Echocardiogram 12/09/2019: 1. Left ventricular ejection fraction, by estimation, is 55 to 60%. The  left ventricle has normal function. The left ventricle has no regional  wall motion abnormalities. There is mild concentric left ventricular  hypertrophy. Left ventricular diastolic  parameters are indeterminate. Elevated left ventricular end-diastolic pressure.  2. Right ventricular systolic function is normal. The right ventricular  size is normal. There is normal pulmonary artery systolic pressure. The  estimated right ventricular systolic pressure is 33.0 10.9.  3. Left atrial size was mildly dilated.  4. The mitral valve has been repaired/replaced. No evidence of mitral  stenosis. There is a 25 mm mechanical valve present in the mitral  position. Echo findings are consistent with normal structure and function  of the mitral valve prosthesis. Mildly  increased gradient across MV with MV peak gradient, 15.6 mmHg. The mean  mitral valve gradient is 5 mmHg. Mitral regurgitation cannot be assessed  due to LA shadowing from mechanical MV mitral valve regurgitation.  5. The aortic valve has been repaired/replaced. There is a mechanical  valve present in the aortic position. Echo findings are consistent with  normal structure and function of the aortic valve prosthesis. Trivial  perivalvular AI is present. There is a  mild gradient across the AV with mean AVG 25mmH91m6. The inferior vena cava is normal in size with greater than 50%  respiratory variability, suggesting right atrial pressure of 3 mmHg.  7. Compared to echo of 03/2019, the mean MVG of MV mechanical prosthesis  has improved from 7 to 5mmHg.35me mean AVG of the AV mechanical prosthesis  has also  improved from 29 to 25mmHg.11mE 12/10/2019: 1. Left ventricular ejection fraction, by estimation, is 55 to 60%. The  left ventricle has normal function. Abnormal septal motion due to post op status.  2. Right ventricular systolic function is normal. The right ventricular  size is normal. There is mildly elevated pulmonary artery systolic  pressure.  3. Left atrial size was moderately dilated. No left atrial/left atrial  appendage thrombus was detected.  4. The mitral valve is abnormal. Mild mitral valve regurgitation. There is a 25 mm Regent mechanical valve mechanical valve present in the mitral  position. Mean PG 7 mmHg, uncahnged compared to post op echocardiogram in  2019.  5. The aortic valve is abnormal. Aortic valve regurgitation Trivial  paravalvular leak. There is a 19 mm Regent Mechanical valve valve present  in the aortic position. Procedure Date:  2019. Mean PG 21 mmHg, Vmax 2.9  m/sec- uncahnged compared to post op  echocardiogram in 2019. No pannus, dehiscence. Acceleration time Minimally  over 100 msec. No significant prosthetic valve stenosis seen. Moderate  tricuspid regurgitation. 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 LV not performed.    EKG:  EKG 12/08/2019: Normal sinus rhythm at rate of 83 bpm, left atrial enlargement, normal axis.  No evidence of ischemia.  Scheduled Meds: . ALPRAZolam  0.5 mg Oral TID  . dextromethorphan-guaiFENesin  1 tablet Oral BID  . enoxaparin (LOVENOX) injection  80 mg Subcutaneous Q12H  . FLUoxetine  60 mg Oral Daily  . insulin aspart  0-15 Units Subcutaneous TID WC  . insulin aspart  0-5 Units Subcutaneous QHS  . metoprolol tartrate  50 mg Oral BID  . nystatin  5 mL Oral QID  . pantoprazole   40 mg Oral QPC breakfast  . rosuvastatin  10 mg Oral QPC breakfast  . sodium chloride flush  3 mL Intravenous Q12H  . sodium chloride flush  3 mL Intravenous Q12H  . sodium chloride flush  3 mL Intravenous Q12H  . traZODone  100 mg Oral QHS  . Warfarin - Pharmacist Dosing Inpatient   Does not apply q1600   Continuous Infusions: . sodium chloride    . sodium chloride     PRN Meds:.sodium chloride, sodium chloride, acetaminophen, ipratropium-albuterol, ondansetron (ZOFRAN) IV, sodium chloride flush, sodium chloride flush  Assessment/Plan:  Holly Figueroa  is a 54 y.o. female with a hx of mitral stenosis and aortic stenosis s/p mMVR and mAVR (Dr. Lincoln Brigham, 11/01/2017), HTN, COPD, DM2, IDA, GERD, prior tobacco use disorder quit in May 2019 after valve surgery, OSA unable to tolerate CPAP and presently on nocturnal O2 therapy who has had multiple outpatient evaluations and also recent hospitalization with recurrent pulmonary edema.  I had not seen her for almost 2 years.  Since I had last seen her, she has undergone mitral valve and aortic valve replacement.  Postoperative course was complicated by sternal dehiscence and wound infection, recurrent pulmonary edema, paroxysmal atrial fibrillation and recurrent congestive heart failure.  She has had multiple hospitalizations and office visits due to continued dyspnea, presently sleeps on 6 pillows.  1.  Recurrent acute pulmonary edema related to poor eating habits and ongoing tobacco use. Not admitted to me that she smokes but has admitted to some staff members. 2.  History of mechanical aortic and mitral valve replacement on 04/03/2019  AV 75m ST JUDE REGENT STANDARD MECH PROSTHETIC MV: 230mST JUDE REGENT STANDARD 3.  Mechanical aortic valve with gradients suggestive of moderate aortic valve stenosis, peak gradient close to 4 m/s with a mean gradient of 25 mmHg. Probably related to small LVOT and size mismatch. 4.  Hypertension 5.  Long-term  anticoagulation with warfarin 6. Essential hypertension 7. OSA not on CPAP and presently on home O2. 8. Acute renal insufficiency due to over diuresis.  Rec: Have started her on Entresto low dose 24/26 mg and Aldactone and discontinue Lasix for now. Mild renal insufficiency related to aggressive diuresis and expect to normalize over next few days. Discussed diet and tobacco use with the patient. In the absence of hypertension, doubt renal artery stenosis, but will obtain renal  duplex.   Otherwise stable from CV standpoint and I can see her OP if she chooses to f/u with me.  INR 2.5 to 3.5 is therapeutic and she is a endocarditis prophylaxis candidate.  Adrian Prows, M.D. 12/11/2019, 6:47 AM [facility] Pager: [phone] Office: [phone]

## 2019-12-12 LAB — GLUCOSE, CAPILLARY
Glucose-Capillary: 163 mg/dL — ABNORMAL HIGH (ref 70–99)
Glucose-Capillary: 116 mg/dL — ABNORMAL HIGH (ref 70–99)
Glucose-Capillary: 149 mg/dL — ABNORMAL HIGH (ref 70–99)

## 2019-12-12 LAB — BASIC METABOLIC PANEL
Anion gap: 8 (ref 5–15)
BUN: 24 mg/dL — ABNORMAL HIGH (ref 6–20)
CO2: 27 mmol/L (ref 22–32)
Calcium: 9.1 mg/dL (ref 8.9–10.3)
Chloride: 102 mmol/L (ref 98–111)
Creatinine, Ser: 1.29 mg/dL — ABNORMAL HIGH (ref 0.44–1.00)
GFR calc Af Amer: 55 mL/min — ABNORMAL LOW (ref >60)
GFR calc non Af Amer: 48 mL/min — ABNORMAL LOW (ref >60)
Glucose, Bld: 107 mg/dL — ABNORMAL HIGH (ref 70–99)
Potassium: 4.8 mmol/L (ref 3.5–5.1)
Sodium: 137 mmol/L (ref 135–145)

## 2019-12-12 LAB — CBC
HCT: 29.4 % — ABNORMAL LOW (ref 36.0–46.0)
Hemoglobin: 9 g/dL — ABNORMAL LOW (ref 12.0–15.0)
MCH: 30.9 pg (ref 26.0–34.0)
MCHC: 30.6 g/dL (ref 30.0–36.0)
MCV: 101 fL — ABNORMAL HIGH (ref 80.0–100.0)
Platelets: 391 10*3/uL (ref 150–400)
RBC: 2.91 MIL/uL — ABNORMAL LOW (ref 3.87–5.11)
RDW: 14.5 % (ref 11.5–15.5)
WBC: 10.3 10*3/uL (ref 4.0–10.5)
nRBC: 0 % (ref 0.0–0.2)

## 2019-12-12 LAB — PROTIME-INR
INR: 1.9 — ABNORMAL HIGH (ref 0.8–1.2)
Prothrombin Time: 21.4 seconds — ABNORMAL HIGH (ref 11.4–15.2)

## 2019-12-12 MED ORDER — SPIRONOLACTONE 25 MG PO TABS: 12.5000 mg | ORAL_TABLET | Freq: Every day | ORAL | 4 refills | Status: AC

## 2019-12-12 MED ORDER — FUROSEMIDE 40 MG PO TABS: 40.0000 mg | ORAL_TABLET | Freq: Every day | ORAL | 11 refills | Status: AC

## 2019-12-12 MED ORDER — METOPROLOL TARTRATE 25 MG PO TABS
12.5000 mg | ORAL_TABLET | Freq: Two times a day (BID) | ORAL | 11 refills | Status: AC
Start: 2019-12-12 — End: 2020-12-11

## 2019-12-12 MED ORDER — WARFARIN SODIUM 10 MG PO TABS: 10.0000 mg | ORAL_TABLET | Freq: Once | ORAL | Status: DC

## 2019-12-12 NOTE — Progress Notes (Signed)
Pt is ready to be D/C today. Pt needs O2 tank. Contacted Ashley with [employer] for the O2 tank. He will f/u with the O2 tank and the office in [place] will continue to provider her O2 needs.

## 2019-12-12 NOTE — Progress Notes (Signed)
ANTICOAGULATION CONSULT NOTE -  Follow up  Pharmacy Consult for warfarin Indication: mechanical MVR, AVR  Allergies  Allergen Reactions  . Iodinated Diagnostic Agents Anaphylaxis  . Aspirin Other (See Comments)    "Possible blood in stool" the 3109m. Can take 838m  . Ibuprofen Other (See Comments)    "Possible blood in stool"  . Sulfa Antibiotics Hives  . Zofran [Ondansetron Hcl]     Bad headaches  . Dilaudid [Hydromorphone Hcl] Itching and Palpitations  . Hydrocodone Palpitations    Patient Measurements: Height: _0  (167.6 cm) Weight: 78.7 kg (173 lb 8 oz) (scale b) IBW/kg (Calculated) : 59.3 Heparin (IV) Dosing Weight: 76 kg  Vital Signs: Temp: 98 F (36.7 C) (06/19 0735) Temp Source: Oral (06/19 0735) BP: 88/50 (06/19 0424) Pulse Rate: 55 (06/19 0424)  Labs: Recent Labs    12/10/19 0328 12/10/19 1625 12/10/19 1629 12/10/19 1629 12/11/19 0609 12/12/19 0559  HGB 8.0*   < > 8.8*   < > 8.9* 9.0*  HCT 25.8*   < > 26.0*  --  28.2* 29.4*  PLT 291  --   --   --  379 391  LABPROT 17.9*  --   --   --  18.9* 21.4*  INR 1.5*  --   --   --  1.6* 1.9*  CREATININE 1.34*  --   --   --  1.14* 1.29*   < > = values in this interval not displayed.    Estimated Creatinine Clearance: 54 mL/min (A) (by C-G formula based on SCr of 1.29 mg/dL (H)).   Medical History: Past Medical History:  Diagnosis Date  . Acute respiratory failure ([place]  . Anemia 08-2008   Blood transfusion  . Anxiety   . Benzodiazepine dependence ([facility]  . Cervical cancer ([place]  . CHF (congestive heart failure) ([place]  . Depression   . Diabetes mellitus without complication (HC[place]  . Headache(784.0)    migraines  . Hypokalemia   . Legionella pneumonia ([place]CH[place]  . Leukocytosis, unspecified   . Mitral stenosis   . Panic attacks   . Tobacco abuse     Medications:  Scheduled:  . ALPRAZolam  0.5 mg Oral TID  . dextromethorphan-guaiFENesin  1 tablet Oral BID  . enoxaparin (LOVENOX) injection   80 mg Subcutaneous Q12H  . FLUoxetine  60 mg Oral Daily  . insulin aspart  0-15 Units Subcutaneous TID WC  . insulin aspart  0-5 Units Subcutaneous QHS  . metoprolol tartrate  50 mg Oral BID  . nystatin  5 mL Oral QID  . pantoprazole  40 mg Oral QPC breakfast  . rosuvastatin  10 mg Oral QPC breakfast  . sacubitril-valsartan  1 tablet Oral BID  . sodium chloride flush  3 mL Intravenous Q12H  . sodium chloride flush  3 mL Intravenous Q12H  . sodium chloride flush  3 mL Intravenous Q12H  . spironolactone  12.5 mg Oral Daily  . traZODone  100 mg Oral QHS  . Warfarin - Pharmacist Dosing Inpatient   Does not apply q1600    Assessment: 5213of with AVR, mechanical MVR on warfarin PTA. PTA dose 5 mg daily per CPhT med history. Last dose 6/13. INR subtherapeutic at 1.4 on admit.   INR remains subtherapeutic at 1.9. H&H stable at 9/29.4 and platelets wnl at 391. Creatinine elevated from prior, today at 1.29 but eCrCl 54 ml/min. Weight today is 78.7 kg. No bleeding noted.   Goal  of Therapy:  INR 2.5-3.5 Monitor platelets by anticoagulation protocol: Yes   Plan:  Warfarin 10 mg PO x1 tonight  Continue enoxaparin 80 mg (rounded up from 1 mg/kg) [place] Q12h until therapeutic INR   Daily INR, CBC Monitor for bleeding  May likely need a modified warfarin regimen at discharge, recommend close follow up with provider that doses her warfarin   Thank you,   Eddie Candle, PharmD PGY-1 Pharmacy Resident   Please check amion for clinical pharmacist contact number After 10:00 PM, call [place] [phone]

## 2019-12-12 NOTE — Discharge Summary (Signed)
Physician Discharge Summary  Tyree Vandruff Ciani DZH:[id number] DOB: Dec 08, 1965 DOA: 12/08/2019  PCP: Glenda Chroman, MD  Admit date: 12/08/2019 Discharge date: 12/12/2019  Admitted From: Home Disposition: Home Recommendations for Outpatient Follow-up:  1. Follow up with PCP in 1-2 weeks 2. Please obtain BMP/CBC in one week 3. Follow-up with cardiologist  Home Health: Yes Equipment/Devices: Oxygen 3 L 24/7  Discharge Condition stable CODE STATUS full code Diet recommendation: Cardiac diet Brief/Interim Summary: 54 year old female with history of diabetes, hyperlipidemia, tobacco abuse, COPD, depression and anxiety, mitral valve replacement on Coumadin, GERD, obstructive sleep apnea, admitted with shortness of breath cough congestion and was found to be severely hypoxic. At baseline she uses 3 L of oxygen at night. She was treated as an outpatient with doxycycline with some improvement she finished a course of 10 days of doxy. She denies any fever. She reports using 8 pillows at night to sleep due to orthopnea. Chest x-ray in the ER showed diffuse pulmonary edema.   Discharge Diagnoses:  Principal Problem:   Acute on chronic respiratory failure with hypoxia (HCC) Active Problems:   Tobacco abuse   Macrocytic anemia   Gastroesophageal reflux disease   Chronic obstructive pulmonary disease (HCC)   CHF exacerbation (HCC)   Diabetes mellitus without complication (HCC)   Mitral stenosis   Elevated troponin   Thrush   Depression   Anxiety  1 acute on chronic hypoxic respiratory failure secondary to acute diastolic CHF exacerbation in the setting of COPD-she was  admitted with shortness of breath cough orthopnea and hypoxia, BNP 319  TEE-no significant prosthetic valve stenosis Echo6/16with ejection fraction 45 to 50% She was treated with IV Lasix, Aldactone, metoprolol. She will be discharged home on the above medications. Lasix 40 mg daily, Aldactone 12.5 daily metoprolol 12.5  twice daily Chest x-ray with pulmonary edema Creatinine was 1.29 on discharge.  Seen by physical therapy recommended home health PT.  Her saturation was 90% on 3 L of oxygen.  She was asked to use oxygen 24/7.  2 history of essential hypertension-blood pressure 102/66. Discharge medications as above.  3 hyperlipidemia on statin  4 type 2 diabetes she was asked to continue Metformin and glipizide.  She was asked to follow-up with PCP to check her renal functions.  5 chronic macrocytic anemia TSH b 12 and folate are normal.  6 status post AVR and mitral valve replacement on Coumadin INR1.9 on discharge.  7 obstructive sleep apnea on oxygen at night at 3 L  8 tobacco abuse continue nicotine patch Estimated body mass index is 28 kg/m as calculated from the following:   Height as of this encounter: _0  (1.676 m).   Weight as of this encounter: 78.7 kg.  Discharge Instructions  Discharge Instructions    Diet - low sodium heart healthy   Complete by: As directed    Increase activity slowly   Complete by: As directed      Allergies as of 12/12/2019      Reactions   Iodinated Diagnostic Agents Anaphylaxis   Aspirin Other (See Comments)   "Possible blood in stool" the 318m. Can take 858m   Ibuprofen Other (See Comments)   "Possible blood in stool"   Sulfa Antibiotics Hives   Zofran [ondansetron Hcl]    Bad headaches   Dilaudid [hydromorphone Hcl] Itching, Palpitations   Hydrocodone Palpitations      Medication List    STOP taking these medications   acetaminophen 500 MG tablet Commonly known as: TYLENOL  aspirin EC 81 MG tablet   cyanocobalamin 1000 MCG tablet   fluticasone 50 MCG/ACT nasal spray Commonly known as: FLONASE   loratadine 10 MG tablet Commonly known as: CLARITIN   potassium chloride SA 20 MEQ tablet Commonly known as: KLOR-CON   umeclidinium-vilanterol 62.5-25 MCG/INH Aepb Commonly known as: ANORO ELLIPTA   Vitamin D-3 125 MCG (5000  UT) Tabs     TAKE these medications   ALPRAZolam 1 MG tablet Commonly known as: XANAX Take 0.5 tablets (0.5 mg total) by mouth 3 (three) times daily. anxiety What changed:   how much to take  when to take this   FLUoxetine HCl 60 MG Tabs Take 60 mg by mouth daily.   furosemide 40 MG tablet Commonly known as: Lasix Take 1 tablet (40 mg total) by mouth daily. What changed:   medication strength  how much to take   glipiZIDE 5 MG 24 hr tablet Commonly known as: GLUCOTROL XL Take 5 mg by mouth 2 (two) times daily.   metFORMIN 500 MG tablet Commonly known as: GLUCOPHAGE Take 1 tablet (500 mg total) by mouth 2 (two) times daily with a meal.   metoprolol tartrate 25 MG tablet Commonly known as: LOPRESSOR Take 0.5 tablets (12.5 mg total) by mouth 2 (two) times daily. What changed:   medication strength  how much to take   pantoprazole 40 MG tablet Commonly known as: PROTONIX Take 40 mg by mouth daily after breakfast.   rosuvastatin 10 MG tablet Commonly known as: CRESTOR Take 10 mg by mouth daily after breakfast.   spironolactone 25 MG tablet Commonly known as: ALDACTONE Take 0.5 tablets (12.5 mg total) by mouth daily. Start taking on: December 13, 2019   traZODone 100 MG tablet Commonly known as: DESYREL Take 100 mg by mouth at bedtime.   warfarin 5 MG tablet Commonly known as: COUMADIN Take 5 mg by mouth at bedtime.            Durable Medical Equipment  (From admission, onward)         Start     Ordered   12/11/19 1503  For home use only DME oxygen  Once       Question Answer Comment  Length of Need Lifetime   Mode or (Route) Nasal cannula   Liters per Minute 3   Frequency Continuous (stationary and portable oxygen unit needed)   Oxygen conserving device Yes   Oxygen delivery system Gas      12/11/19 1503          Follow-up Information    Vyas, Costella Hatcher, MD Follow up.   Specialty: Internal Medicine Contact information: [place] [address] [phone]        Ward, Jeani Hawking, MD .   Specialty: Internal Medicine Contact information: [place] [address] [phone]        Adrian Prows, MD Follow up.   Specialty: Cardiology Contact information: [address] [place] [address] [phone]              Allergies  Allergen Reactions  . Iodinated Diagnostic Agents Anaphylaxis  . Aspirin Other (See Comments)    "Possible blood in stool" the 324m. Can take 814m  . Ibuprofen Other (See Comments)    "Possible blood in stool"  . Sulfa Antibiotics Hives  . Zofran [Ondansetron Hcl]     Bad headaches  . Dilaudid [Hydromorphone Hcl] Itching and Palpitations  . Hydrocodone Palpitations  Consultations: Cardiologist  Procedures/Studies: DG Chest 2 View  Result Date: 12/08/2019 CLINICAL DATA:  Chest pain and shortness of breath for 2 days. Hypoxia. EXAM: CHEST - 2 VIEW COMPARISON:  03/27/2019 FINDINGS: Stable mild cardiomegaly. 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 Diffuse interstitial and airspace disease shows mild increase since previous study, most likely due to diffuse pulmonary edema. No evidence of pleural effusion. IMPRESSION: Mild increase in diffuse interstitial and airspace disease, likely due to diffuse pulmonary edema. Electronically Signed   By: Marlaine Hind M.D.   On: 12/08/2019 10:24   CARDIAC CATHETERIZATION  Addendum Date: 12/11/2019   Normal coronary arteries. No CAD. 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 No CAD. 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 Exam Location:  Inpatient Procedure: 2D Echo, Color Doppler and Cardiac Doppler Indications:    I50.9* Heart failure (unspecified)  History:        Patient has prior history of Echocardiogram examinations, most                 recent 03/28/2019. CHF, COPD; Risk Factors:Diabetes. 11/01/17                 Aortic Valve replaced with 70m Regent Bileaflet Mechanical                 Prosthetic and Mitral Valve replaced with 255mRegent Bileaflet                 Mechanical Prosthetic.                  Mitral Valve: 25 mm mechanical valve valve is present in the                 mitral position.  Sonographer:    EmRaquel Sarnaenior RDCS Referring Phys: [id number]. Left ventricular ejection fraction, by estimation, is 55 to 60%. The left ventricle has normal function. The left ventricle has no regional wall motion abnormalities. There is mild concentric left ventricular hypertrophy. Left ventricular diastolic parameters are indeterminate. Elevated left ventricular end-diastolic pressure.  2. Right ventricular systolic function is normal. The right ventricular size is normal. There is normal pulmonary artery systolic pressure.  The estimated right ventricular systolic pressure is 65.5 mmHg.  3. Left atrial size was mildly dilated.  4. The mitral valve has been repaired/replaced. No evidence of mitral stenosis. There is a 25 mm mechanical valve present in the mitral position. Echo findings are consistent with normal structure and function of the mitral valve prosthesis. Mildly increased gradient across MV with MV peak gradient, 15.6 mmHg. The mean mitral valve gradient is 5  mmHg. Mitral regurgitation cannot be assessed due to LA shadowing from mechanical MV mitral valve regurgitation.  5. The aortic valve has been repaired/replaced. There is a mechanical valve present in the aortic position. Echo findings are consistent with normal structure and function of the aortic valve prosthesis. Trivial perivalvular AI is present. There is a mild gradient across the AV with mean AVG 35mHg.  6. The inferior vena cava is normal in size with greater than 50% respiratory variability, suggesting right atrial pressure of 3 mmHg.  7. COmpared to echo of 03/2019, the mean MVG of MV mechanical prosthesis has improved from 7 to 54mg. The mean AVG of the AV mechanical prosthesis has also improved from 29 to 2576m. FINDINGS  Left Ventricle: Left ventricular ejection fraction, by estimation, is 55 to 60%. The left ventricle has normal function. The left ventricle has no regional wall motion abnormalities. The left ventricular internal cavity size was normal in size. There is  mild concentric left ventricular hypertrophy. Left ventricular diastolic parameters are indeterminate. Elevated left ventricular end-diastolic pressure. Right Ventricle: The right ventricular size is normal. No increase in right ventricular wall thickness. Right ventricular systolic function is normal. There is normal pulmonary artery systolic pressure. The tricuspid regurgitant velocity is 2.74 m/s, and  with an assumed right atrial pressure of 3 mmHg, the estimated right ventricular systolic pressure is 33.37.4Hg. Left Atrium: Left atrial size was mildly dilated. Right Atrium: Right atrial size was normal in size. Pericardium: There is no evidence of pericardial effusion. Mitral Valve: The mitral valve has been repaired/replaced. Mitral regurgitation cannot be assessed due to LA shadowing from mechanical MV mitral valve regurgitation. There is a 25 mm mechanical valve present in the mitral position. Echo findings are consistent  with normal structure and function of the mitral valve prosthesis. No evidence of mitral valve stenosis. MV peak gradient, 15.6 mmHg. The mean mitral valve gradient is 5.0 mmHg. Tricuspid Valve: The tricuspid valve is normal in structure. Tricuspid valve regurgitation is mild . No evidence of tricuspid stenosis. Aortic Valve: The aortic valve has been repaired/replaced. Aortic valve regurgitation Trivial perivalvular AI. No aortic stenosis is present. Aortic valve mean gradient measures 25.0 mmHg. Aortic valve peak gradient measures 52.1 mmHg. Aortic valve area,  by VTI measures 0.95 cm. There is a mechanical valve present in the aortic position. Echo findings are consistent with normal structure and function of the aortic valve prosthesis. Pulmonic Valve: The pulmonic valve was normal in structure. Pulmonic valve regurgitation is not visualized. No evidence of pulmonic stenosis. Aorta: The aortic root is normal in size and structure. Venous: The inferior vena cava is normal in size with greater than 50% respiratory variability, suggesting right atrial pressure of 3 mmHg. IAS/Shunts: No atrial level shunt detected by color flow Doppler.  LEFT VENTRICLE PLAX 2D LVIDd:         3.50 cm  Diastology LVIDs:         2.50 cm  LV e' lateral:   5.44 cm/s LV PW:  1.40 cm  LV E/e' lateral: 33.6 LV IVS:        1.20 cm  LV e' medial:    4.79 cm/s LVOT diam:     1.70 cm  LV E/e' medial:  38.2 LV SV:         75 LV SV Index:   40 LVOT Area:     2.27 cm  RIGHT VENTRICLE RV S prime:     5.55 cm/s TAPSE (M-mode): 1.6 cm LEFT ATRIUM             Index       RIGHT ATRIUM           Index LA Vol (A2C):   65.5 ml 34.46 ml/m RA Area:     16.70 cm LA Vol (A4C):   77.0 ml 40.51 ml/m RA Volume:   45.40 ml  23.89 ml/m LA Biplane Vol: 72.4 ml 38.09 ml/m  AORTIC VALVE AV Area (Vmax):    0.76 cm AV Area (Vmean):   0.88 cm AV Area (VTI):     0.95 cm AV Vmax:           361.00 cm/s AV Vmean:          229.000 cm/s AV VTI:             0.797 m AV Peak Grad:      52.1 mmHg AV Mean Grad:      25.0 mmHg LVOT Vmax:         121.00 cm/s LVOT Vmean:        89.200 cm/s LVOT VTI:          0.332 m LVOT/AV VTI ratio: 0.42  AORTA Ao Asc diam: 2.60 cm MITRAL VALVE                TRICUSPID VALVE MV Area (PHT): 3.48 cm     TR Peak grad:   30.0 mmHg MV Peak grad:  15.6 mmHg    TR Vmax:        274.00 cm/s MV Mean grad:  5.0 mmHg MV Vmax:       1.98 m/s     SHUNTS MV Vmean:      93.0 cm/s    Systemic VTI:  0.33 m MV Decel Time: 218 msec     Systemic Diam: 1.70 cm MV E velocity: 183.00 cm/s MV A velocity: 52.80 cm/s MV E/A ratio:  3.47 Fransico Him MD Electronically signed by Fransico Him MD Signature Date/Time: 12/09/2019/2:53:13 PM    Final    ECHO TEE  Result Date: 12/10/2019    TRANSESOPHOGEAL ECHO REPORT   Patient Name:   Eda Paschal Date of Exam: 12/10/2019 Medical Rec #:  [id number]           Height:       66.0 in Accession #:    [id number]          Weight:       176.2 lb Date of Birth:  11/08/1965            BSA:          1.895 m Patient Age:    53 years            BP:           114/52 mmHg Patient Gender: F                   HR:  82 bpm. Exam Location:  Inpatient Procedure: Transesophageal Echo, Color Doppler and Cardiac Doppler Indications:     Aortic Stenosis  History:         Patient has prior history of Echocardiogram examinations, most                  recent 12/09/2019. CHF, COPD; Risk Factors:Diabetes. 11/01/17                  Aortic Valve replaced with 67m Regent Bileaflet Mechanical                  Prosthetic and Mitral Valve replaced with 227mRegent Bileaflet                  Mechanical Prosthetic.                  Aortic Valve: 19 mm Regent Mechanical valve valve is present in                  the aortic position. Procedure Date: 2019.                  Mitral Valve: 25 mm Regent mechanical valve mechanical valve                  valve is present in the mitral position.  Sonographer:     EmRaquel Sarnaenior RDCS Referring Phys:   [id number] BowenATWARDHAN Diagnosing Phys: MaVernell LeepD PROCEDURE: After discussion of the risks and benefits of a TEE, an informed consent was obtained from the patient. The transesophogeal probe was passed without difficulty through the esophogus of the patient. Sedation performed by different physician. The patient was monitored while under deep sedation. Anesthestetic sedation was provided intravenously by Anesthesiology: [id number] Propofol. The patient developed no complications during the procedure. IMPRESSIONS  1. Left ventricular ejection fraction, by estimation, is 55 to 60%. The left ventricle has normal function. Abnormal septal motion due to post op status.  2. Right ventricular systolic function is normal. The right ventricular size is normal. There is mildly elevated pulmonary artery systolic pressure.  3. Left atrial size was moderately dilated. No left atrial/left atrial appendage thrombus was detected.  4. The mitral valve is abnormal. Mild mitral valve regurgitation. There is a 25 mm Regent mechanical valve mechanical valve present in the mitral position. Mean PG 7 mmHg, uncahnged compared to post op echocardiogram in 2019.  5. The aortic valve is abnormal. Aortic valve regurgitation Trivial paravalvular leak. There is a 19 mm Regent Mechanical valve valve present in the aortic position. Procedure Date: 2019. Mean PG 21 mmHg, Vmax 2.9 m/sec- uncahnged compared to post op echocardiogram in 2019. No pannus, dehiscence. Acceleration time Minimally over 100 msec. No significant prosthetic valve stenosis seen. Moderate tricuspid regurgitation. Estimated RVSP 40 mmhg FINDINGS  Left Ventricle: Left ventricular ejection fraction, by estimation, is 55 to 60%. The left ventricle has normal function. The left ventricle demonstrates regional wall motion abnormalities. The left ventricular internal cavity size was normal in size. There is no left ventricular hypertrophy. Abnormal (paradoxical) septal  motion consistent with post-operative status. Right Ventricle: The right ventricular size is normal. No increase in right ventricular wall thickness. Right ventricular systolic function is normal. There is mildly elevated pulmonary artery systolic pressure. The tricuspid regurgitant velocity is 3.05  m/s, and with an assumed right atrial pressure of 3 mmHg, the estimated right ventricular systolic pressure is 40.90.2Hg. Left Atrium:  Left atrial size was moderately dilated. No left atrial/left atrial appendage thrombus was detected. Right Atrium: Right atrial size was normal in size. Pericardium: There is no evidence of pericardial effusion. Mitral Valve: The mitral valve is abnormal. Mild mitral valve regurgitation. There is a 25 mm Regent mechanical valve mechanical valve present in the mitral position. MV peak gradient, 20.8 mmHg. The mean mitral valve gradient is 5.7 mmHg. Tricuspid Valve: The tricuspid valve is grossly normal. Tricuspid valve regurgitation is moderate. Aortic Valve: The aortic valve is abnormal. Aortic valve regurgitation Trivial paravalvular leak. Aortic valve mean gradient measures 21.0 mmHg. Aortic valve peak gradient measures 35.8 mmHg. There is a 19 mm Regent Mechanical valve valve present in the aortic position. Procedure Date: 2019. Pulmonic Valve: The pulmonic valve was grossly normal. Pulmonic valve regurgitation is not visualized. Aorta: The aortic root is normal in size and structure. IAS/Shunts: No atrial level shunt detected by color flow Doppler.  AORTIC VALVE AV Vmax:      299.00 cm/s AV Vmean:     222.000 cm/s AV VTI:       0.680 m AV Peak Grad: 35.8 mmHg AV Mean Grad: 21.0 mmHg MITRAL VALVE            TRICUSPID VALVE MV Peak grad: 20.8 mmHg TR Peak grad:   37.2 mmHg MV Mean grad: 5.7 mmHg  TR Vmax:        305.00 cm/s MV Vmax:      2.28 m/s MV Vmean:     96.8 cm/s Manish Patwardhan MD Electronically signed by Vernell Leep MD Signature Date/Time: 12/10/2019/5:12:33 PM    Final      (Echo, Carotid, EGD, Colonoscopy, ERCP)    Subjective:  Sitting up in bed awake and alert anxious to go home slept well breathing better Discharge Exam: Vitals:   12/12/19 0735 12/12/19 1100  BP:    Pulse:    Resp:    Temp: 98 F (36.7 C) 98.3 F (36.8 C)  SpO2:  100%   Vitals:   12/12/19 0209 12/12/19 0424 12/12/19 0735 12/12/19 1100  BP:  (!) 88/50    Pulse:  (!) 55    Resp:      Temp: 98.4 F (36.9 C) 98 F (36.7 C) 98 F (36.7 C) 98.3 F (36.8 C)  TempSrc: Oral Oral Oral Oral  SpO2:  98%  100%  Weight: 78.7 kg     Height:        General: Pt is alert, awake, not in acute distress Cardiovascular: RRR, S1/S2 +, no rubs, no gallops Respiratory: CTA bilaterally, no wheezing, no rhonchi Abdominal: Soft, NT, ND, bowel sounds + Extremities: no edema, no cyanosis    The results of significant diagnostics from this hospitalization (including imaging, microbiology, ancillary and laboratory) are listed below for reference.     Microbiology: Recent Results (from the past 240 hour(s))  SARS Coronavirus 2 by RT PCR (hospital order, performed in [facility] hospital lab) Nasopharyngeal Nasopharyngeal Swab     Status: None   Collection Time: 12/08/19 12:56 PM   Specimen: Nasopharyngeal Swab  Result Value Ref Range Status   SARS Coronavirus 2 NEGATIVE NEGATIVE Final    Comment: (NOTE) SARS-CoV-2 target nucleic acids are NOT DETECTED.  The SARS-CoV-2 RNA is generally detectable in upper and lower respiratory specimens during the acute phase of infection. The lowest concentration of SARS-CoV-2 viral copies this assay can detect is 250 copies / mL. A negative result does not preclude SARS-CoV-2 infection and should  not be used as the sole basis for treatment or other patient management decisions.  A negative result may occur with improper specimen collection / handling, submission of specimen other than nasopharyngeal swab, presence of viral mutation(s) within  the areas targeted by this assay, and inadequate number of viral copies (<250 copies / mL). A negative result must be combined with clinical observations, patient history, and epidemiological information.  Fact Sheet for Patients:   StrictlyIdeas.no  Fact Sheet for Healthcare Providers: BankingDealers.co.za  This test is not yet approved or  cleared by the [place] FDA and has been authorized for detection and/or diagnosis of SARS-CoV-2 by FDA under an Emergency Use Authorization (EUA).  This EUA will remain in effect (meaning this test can be used) for the duration of the COVID-19 declaration under Section 564(b)(1) of the Act, 21 U.S.C. section 360bbb-3(b)(1), unless the authorization is terminated or revoked sooner.  Performed at [facility], [place] [address] [place], [place] [address]   Culture, blood (routine x 2)     Status: None (Preliminary result)   Collection Time: 12/08/19  3:05 PM   Specimen: BLOOD RIGHT HAND  Result Value Ref Range Status   Specimen Description BLOOD RIGHT HAND  Final   Special Requests   Final    BOTTLES DRAWN AEROBIC AND ANAEROBIC Blood Culture results may not be optimal due to an inadequate volume of blood received in culture bottles   Culture   Final    NO GROWTH 4 DAYS Performed at [facility], [place] [address], [place], [place] [address]    Report Status PENDING  Incomplete  Culture, blood (routine x 2)     Status: None (Preliminary result)   Collection Time: 12/08/19  4:51 PM   Specimen: BLOOD LEFT ARM  Result Value Ref Range Status   Specimen Description BLOOD LEFT ARM  Final   Special Requests   Final    BOTTLES DRAWN AEROBIC ONLY Blood Culture adequate volume   Culture   Final    NO GROWTH 4 DAYS Performed at [facility], [place] [address], [place], [place] [address]    Report Status PENDING  Incomplete  Culture, Urine     Status: Abnormal   Collection Time:  12/08/19  5:16 PM   Specimen: Urine, Random  Result Value Ref Range Status   Specimen Description URINE, RANDOM  Final   Special Requests   Final    NONE Performed at [facility], [place] [address], [place], [place] [address]    Culture (A)  Final    >=100,000 COLONIES/mL ESCHERICHIA COLI Confirmed Extended Spectrum Beta-Lactamase Producer (ESBL).  In bloodstream infections from ESBL organisms, carbapenems are preferred over piperacillin/tazobactam. They are shown to have a lower risk of mortality.    Report Status 12/10/2019 FINAL  Final   Organism ID, Bacteria ESCHERICHIA COLI (A)  Final      Susceptibility   Escherichia coli - MIC*    AMPICILLIN >=32 RESISTANT Resistant     CEFAZOLIN >=64 RESISTANT Resistant     CEFTRIAXONE >=64 RESISTANT Resistant     CIPROFLOXACIN 0.5 SENSITIVE Sensitive     GENTAMICIN >=16 RESISTANT Resistant     IMIPENEM <=0.25 SENSITIVE Sensitive     NITROFURANTOIN <=16 SENSITIVE Sensitive     TRIMETH/SULFA >=320 RESISTANT Resistant     AMPICILLIN/SULBACTAM >=32 RESISTANT Resistant     PIP/TAZO <=4 SENSITIVE Sensitive     * >=100,000 COLONIES/mL ESCHERICHIA COLI  Labs: BNP (last 3 results) Recent Labs    03/28/19 0248 12/08/19 1230 12/11/19 0656  BNP 162.0* 319.5* 564.3*   Basic Metabolic Panel: Recent Labs  Lab 12/08/19 0958 12/08/19 1249 12/08/19 1501 12/09/19 0554 12/09/19 0554 12/10/19 0328 12/10/19 0328 12/10/19 1625 12/10/19 1626 12/10/19 1629 12/11/19 0609 12/12/19 0559  NA 135   < >  --  138   < > 138   < > 141 141 141 135 137  K 3.5   < >  --  3.0*   < > 5.0   < > 4.8 4.8 4.8 4.2 4.8  CL 100  --   --  99  --  101  --   --   --   --  100 102  CO2 23  --   --  29  --  27  --   --   --   --  26 27  GLUCOSE 285*  --   --  127*  --  101*  --   --   --   --  151* 107*  BUN 9  --   --  10  --  17  --   --   --   --  18 24*  CREATININE 1.02*  --   --  1.04*  --  1.34*  --   --   --   --  1.14* 1.29*  CALCIUM 8.7*  --   --   8.2*  --  8.6*  --   --   --   --  9.3 9.1  MG  --   --  1.4*  --   --  2.4  --   --   --   --   --   --    < > = values in this interval not displayed.   Liver Function Tests: No results for input(s): AST, ALT, ALKPHOS, BILITOT, PROT, ALBUMIN in the last 168 hours. No results for input(s): LIPASE, AMYLASE in the last 168 hours. No results for input(s): AMMONIA in the last 168 hours. CBC: Recent Labs  Lab 12/08/19 0958 12/08/19 1249 12/09/19 0554 12/09/19 0554 12/10/19 0328 12/10/19 0328 12/10/19 1625 12/10/19 1626 12/10/19 1629 12/11/19 0609 12/12/19 0559  WBC 13.3*  --  8.8  --  6.7  --   --   --   --  11.1* 10.3  HGB 9.0*   < > 7.9*   < > 8.0*   < > 9.2* 9.2* 8.8* 8.9* 9.0*  HCT 29.0*   < > 25.3*   < > 25.8*   < > 27.0* 27.0* 26.0* 28.2* 29.4*  MCV 100.7*  --  99.6  --  99.2  --   --   --   --  97.9 101.0*  PLT 266  --  242  --  291  --   --   --   --  379 391   < > = values in this interval not displayed.   Cardiac Enzymes: No results for input(s): CKTOTAL, CKMB, CKMBINDEX, TROPONINI in the last 168 hours. BNP: Invalid input(s): POCBNP CBG: Recent Labs  Lab 12/11/19 1141 12/11/19 1651 12/11/19 2228 12/12/19 0603 12/12/19 1141  GLUCAP 105* 132* 163* 116* 149*   D-Dimer No results for input(s): DDIMER in the last 72 hours. Hgb A1c No results for input(s): HGBA1C in the last 72 hours. Lipid Profile No results for input(s): CHOL, HDL, LDLCALC, TRIG, CHOLHDL, LDLDIRECT in the last 72  hours. Thyroid function studies No results for input(s): TSH, T4TOTAL, T3FREE, THYROIDAB in the last 72 hours.  Invalid input(s): FREET3 Anemia work up Recent Labs    12/09/19 1630  VITAMINB12 301  FOLATE 8.1  FERRITIN 938*  TIBC 248*  IRON 39  RETICCTPCT 3.0   Urinalysis    Component Value Date/Time   COLORURINE YELLOW 12/08/2019 1504   APPEARANCEUR HAZY (A) 12/08/2019 1504   LABSPEC 1.009 12/08/2019 1504   PHURINE 6.0 12/08/2019 1504   GLUCOSEU 150 (A) 12/08/2019  1504   HGBUR SMALL (A) 12/08/2019 1504   BILIRUBINUR NEGATIVE 12/08/2019 1504   KETONESUR NEGATIVE 12/08/2019 1504   PROTEINUR NEGATIVE 12/08/2019 1504   UROBILINOGEN 0.2 10/28/2014 2330   NITRITE POSITIVE (A) 12/08/2019 1504   LEUKOCYTESUR TRACE (A) 12/08/2019 1504   Sepsis Labs Invalid input(s): PROCALCITONIN,  WBC,  LACTICIDVEN Microbiology Recent Results (from the past 240 hour(s))  SARS Coronavirus 2 by RT PCR (hospital order, performed in [place] hospital lab) Nasopharyngeal Nasopharyngeal Swab     Status: None   Collection Time: 12/08/19 12:56 PM   Specimen: Nasopharyngeal Swab  Result Value Ref Range Status   SARS Coronavirus 2 NEGATIVE NEGATIVE Final    Comment: (NOTE) SARS-CoV-2 target nucleic acids are NOT DETECTED.  The SARS-CoV-2 RNA is generally detectable in upper and lower respiratory specimens during the acute phase of infection. The lowest concentration of SARS-CoV-2 viral copies this assay can detect is 250 copies / mL. A negative result does not preclude SARS-CoV-2 infection and should not be used as the sole basis for treatment or other patient management decisions.  A negative result may occur with improper specimen collection / handling, submission of specimen other than nasopharyngeal swab, presence of viral mutation(s) within the areas targeted by this assay, and inadequate number of viral copies (<250 copies / mL). A negative result must be combined with clinical observations, patient history, and epidemiological information.  Fact Sheet for Patients:   StrictlyIdeas.no  Fact Sheet for Healthcare Providers: BankingDealers.co.za  This test is not yet approved or  cleared by the [place] FDA and has been authorized for detection and/or diagnosis of SARS-CoV-2 by FDA under an Emergency Use Authorization (EUA).  This EUA will remain in effect (meaning this test can be used) for the duration of  the COVID-19 declaration under Section 564(b)(1) of the Act, 21 U.S.C. section 360bbb-3(b)(1), unless the authorization is terminated or revoked sooner.  Performed at [facility], [place] [address], [place], [place] [address]   Culture, blood (routine x 2)     Status: None (Preliminary result)   Collection Time: 12/08/19  3:05 PM   Specimen: BLOOD RIGHT HAND  Result Value Ref Range Status   Specimen Description BLOOD RIGHT HAND  Final   Special Requests   Final    BOTTLES DRAWN AEROBIC AND ANAEROBIC Blood Culture results may not be optimal due to an inadequate volume of blood received in culture bottles   Culture   Final    NO GROWTH 4 DAYS Performed at [facility], [place] [address] [place], [place] [address]    Report Status PENDING  Incomplete  Culture, blood (routine x 2)     Status: None (Preliminary result)   Collection Time: 12/08/19  4:51 PM   Specimen: BLOOD LEFT ARM  Result Value Ref Range Status   Specimen Description BLOOD LEFT ARM  Final   Special Requests   Final    BOTTLES DRAWN AEROBIC ONLY Blood  Culture adequate volume   Culture   Final    NO GROWTH 4 DAYS Performed at [facility], [place] [address], [place], [place] [address]    Report Status PENDING  Incomplete  Culture, Urine     Status: Abnormal   Collection Time: 12/08/19  5:16 PM   Specimen: Urine, Random  Result Value Ref Range Status   Specimen Description URINE, RANDOM  Final   Special Requests   Final    NONE Performed at [facility], [place] [address], [place], [place] [address]    Culture (A)  Final    >=100,000 COLONIES/mL ESCHERICHIA COLI Confirmed Extended Spectrum Beta-Lactamase Producer (ESBL).  In bloodstream infections from ESBL organisms, carbapenems are preferred over piperacillin/tazobactam. They are shown to have a lower risk of mortality.    Report Status 12/10/2019 FINAL  Final   Organism ID, Bacteria ESCHERICHIA COLI (A)  Final      Susceptibility    Escherichia coli - MIC*    AMPICILLIN >=32 RESISTANT Resistant     CEFAZOLIN >=64 RESISTANT Resistant     CEFTRIAXONE >=64 RESISTANT Resistant     CIPROFLOXACIN 0.5 SENSITIVE Sensitive     GENTAMICIN >=16 RESISTANT Resistant     IMIPENEM <=0.25 SENSITIVE Sensitive     NITROFURANTOIN <=16 SENSITIVE Sensitive     TRIMETH/SULFA >=320 RESISTANT Resistant     AMPICILLIN/SULBACTAM >=32 RESISTANT Resistant     PIP/TAZO <=4 SENSITIVE Sensitive     * >=100,000 COLONIES/mL ESCHERICHIA COLI     Time coordinating discharge:  39 minutes  SIGNED:   Georgette Shell, MD  [facility] 12/12/2019, 12:35 PM Pager   If 7PM-7AM, please contact night-coverage [email or web address] Password [email or web address]

## 2019-12-13 LAB — CULTURE, BLOOD (ROUTINE X 2)
Culture: NO GROWTH
Culture: NO GROWTH
Special Requests: ADEQUATE

## 2019-12-18 ENCOUNTER — Telehealth: Payer: Self-pay

## 2019-12-18 NOTE — Telephone Encounter (Signed)
Location of hospitalization: [facility] Reason for hospitalization: SOB Date of discharge: 12/12/2019 Date of first communication with patient: today Person contacting patient: Me Current symptoms:  Do you understand why you were in the Hospital: Yes Questions regarding discharge instructions: None Where were you discharged to: Home Medications reviewed: Yes Allergies reviewed: Yes Dietary changes reviewed: Yes. Discussed low fat and low salt diet.  Referals reviewed: NA  Activities of Daily Living: Able to with mild limitations Any transportation issues/concerns: None Any patient concerns: None Confirmed importance & date/time of Follow up appt: Yes Confirmed with patient if condition begins to worsen call. Pt was given the office number and encouraged to call back with questions or concerns: Yes

## 2019-12-24 NOTE — Telephone Encounter (Signed)
LMTCB 2nd call

## 2019-12-25 NOTE — Telephone Encounter (Signed)
LMTCB

## 2020-01-10 DIAGNOSIS — J449 Chronic obstructive pulmonary disease, unspecified: Secondary | ICD-10-CM | POA: Diagnosis not present

## 2020-01-18 DIAGNOSIS — E119 Type 2 diabetes mellitus without complications: Secondary | ICD-10-CM | POA: Diagnosis not present

## 2020-01-18 DIAGNOSIS — E1169 Type 2 diabetes mellitus with other specified complication: Secondary | ICD-10-CM | POA: Diagnosis not present

## 2020-01-18 DIAGNOSIS — Z720 Tobacco use: Secondary | ICD-10-CM | POA: Diagnosis not present

## 2020-01-18 DIAGNOSIS — Z7901 Long term (current) use of anticoagulants: Secondary | ICD-10-CM | POA: Diagnosis not present

## 2020-02-10 DIAGNOSIS — J449 Chronic obstructive pulmonary disease, unspecified: Secondary | ICD-10-CM | POA: Diagnosis not present

## 2024-03-13 ENCOUNTER — Other Ambulatory Visit (HOSPITAL_COMMUNITY): Payer: Self-pay | Admitting: Psychiatry
